# Patient Record
Sex: Male | Born: 1935 | Race: White | Hispanic: No | Marital: Married | Smoking: Former smoker
Health system: Southern US, Community
[De-identification: ages and names within clinical notes are randomized; demographics above are authoritative.]

## PROBLEM LIST (undated history)

## (undated) DIAGNOSIS — E785 Hyperlipidemia, unspecified: Secondary | ICD-10-CM

## (undated) DIAGNOSIS — K5792 Diverticulitis of intestine, part unspecified, without perforation or abscess without bleeding: Secondary | ICD-10-CM

## (undated) DIAGNOSIS — I2510 Atherosclerotic heart disease of native coronary artery without angina pectoris: Secondary | ICD-10-CM

## (undated) DIAGNOSIS — M1990 Unspecified osteoarthritis, unspecified site: Secondary | ICD-10-CM

## (undated) DIAGNOSIS — I10 Essential (primary) hypertension: Secondary | ICD-10-CM

## (undated) DIAGNOSIS — J189 Pneumonia, unspecified organism: Secondary | ICD-10-CM

## (undated) DIAGNOSIS — I252 Old myocardial infarction: Secondary | ICD-10-CM

## (undated) HISTORY — PX: OTHER SURGICAL HISTORY: SHX169

## (undated) HISTORY — PX: INGUINAL HERNIA REPAIR: SUR1180

## (undated) HISTORY — DX: Diverticulitis of intestine, part unspecified, without perforation or abscess without bleeding: K57.92

## (undated) HISTORY — DX: Unspecified osteoarthritis, unspecified site: M19.90

## (undated) HISTORY — DX: Hyperlipidemia, unspecified: E78.5

## (undated) HISTORY — DX: Atherosclerotic heart disease of native coronary artery without angina pectoris: I25.10

## (undated) HISTORY — DX: Essential (primary) hypertension: I10

## (undated) HISTORY — PX: REPLACEMENT TOTAL KNEE: SUR1224

## (undated) HISTORY — DX: Old myocardial infarction: I25.2

---

## 1998-08-01 HISTORY — PX: CORONARY STENT PLACEMENT: SHX1402

## 1999-01-24 ENCOUNTER — Inpatient Hospital Stay (HOSPITAL_COMMUNITY): Admission: EM | Admit: 1999-01-24 | Discharge: 1999-01-27 | Payer: Self-pay | Admitting: Cardiology

## 1999-01-26 HISTORY — PX: CARDIAC CATHETERIZATION: SHX172

## 2004-10-20 ENCOUNTER — Ambulatory Visit: Payer: Self-pay | Admitting: Family Medicine

## 2004-11-22 ENCOUNTER — Ambulatory Visit: Payer: Self-pay | Admitting: Family Medicine

## 2005-06-20 ENCOUNTER — Ambulatory Visit: Payer: Self-pay | Admitting: Family Medicine

## 2005-06-22 ENCOUNTER — Ambulatory Visit: Payer: Self-pay | Admitting: Family Medicine

## 2005-08-31 ENCOUNTER — Ambulatory Visit: Payer: Self-pay | Admitting: Family Medicine

## 2005-09-15 ENCOUNTER — Ambulatory Visit: Payer: Self-pay | Admitting: Family Medicine

## 2005-09-28 ENCOUNTER — Encounter (INDEPENDENT_AMBULATORY_CARE_PROVIDER_SITE_OTHER): Payer: Self-pay | Admitting: Specialist

## 2005-09-28 ENCOUNTER — Ambulatory Visit (HOSPITAL_COMMUNITY): Admission: RE | Admit: 2005-09-28 | Discharge: 2005-09-29 | Payer: Self-pay | Admitting: General Surgery

## 2007-01-08 ENCOUNTER — Emergency Department (HOSPITAL_COMMUNITY): Admission: EM | Admit: 2007-01-08 | Discharge: 2007-01-08 | Payer: Self-pay | Admitting: Emergency Medicine

## 2007-09-14 ENCOUNTER — Encounter: Admission: RE | Admit: 2007-09-14 | Discharge: 2007-09-14 | Payer: Self-pay | Admitting: General Surgery

## 2007-11-01 ENCOUNTER — Ambulatory Visit (HOSPITAL_COMMUNITY): Admission: RE | Admit: 2007-11-01 | Discharge: 2007-11-01 | Payer: Self-pay | Admitting: General Surgery

## 2007-11-27 ENCOUNTER — Encounter (INDEPENDENT_AMBULATORY_CARE_PROVIDER_SITE_OTHER): Payer: Self-pay | Admitting: General Surgery

## 2007-11-27 ENCOUNTER — Ambulatory Visit (HOSPITAL_COMMUNITY): Admission: RE | Admit: 2007-11-27 | Discharge: 2007-11-28 | Payer: Self-pay | Admitting: General Surgery

## 2009-03-10 ENCOUNTER — Inpatient Hospital Stay (HOSPITAL_COMMUNITY): Admission: RE | Admit: 2009-03-10 | Discharge: 2009-03-13 | Payer: Self-pay | Admitting: Specialist

## 2010-02-22 HISTORY — PX: CARDIOVASCULAR STRESS TEST: SHX262

## 2010-08-27 ENCOUNTER — Ambulatory Visit: Payer: Self-pay | Admitting: Cardiology

## 2010-11-06 LAB — CBC
HCT: 32.3 % — ABNORMAL LOW (ref 39.0–52.0)
HCT: 36.1 % — ABNORMAL LOW (ref 39.0–52.0)
Hemoglobin: 10.8 g/dL — ABNORMAL LOW (ref 13.0–17.0)
Hemoglobin: 12.5 g/dL — ABNORMAL LOW (ref 13.0–17.0)
MCHC: 34.6 g/dL (ref 30.0–36.0)
MCHC: 35 g/dL (ref 30.0–36.0)
MCV: 91.8 fL (ref 78.0–100.0)
MCV: 91.8 fL (ref 78.0–100.0)
MCV: 92 fL (ref 78.0–100.0)
Platelets: 204 10*3/uL (ref 150–400)
Platelets: 225 10*3/uL (ref 150–400)
RBC: 3.4 MIL/uL — ABNORMAL LOW (ref 4.22–5.81)
RDW: 12.6 % (ref 11.5–15.5)
RDW: 13 % (ref 11.5–15.5)
WBC: 13.2 10*3/uL — ABNORMAL HIGH (ref 4.0–10.5)
WBC: 8.6 10*3/uL (ref 4.0–10.5)

## 2010-11-06 LAB — BASIC METABOLIC PANEL
BUN: 8 mg/dL (ref 6–23)
CO2: 28 mEq/L (ref 19–32)
Calcium: 8.3 mg/dL — ABNORMAL LOW (ref 8.4–10.5)
Chloride: 101 mEq/L (ref 96–112)
Chloride: 99 mEq/L (ref 96–112)
Creatinine, Ser: 0.84 mg/dL (ref 0.4–1.5)
GFR calc Af Amer: >60 mL/min (ref >60)
GFR calc Af Amer: >60 mL/min (ref >60)
GFR calc non Af Amer: >60 mL/min (ref >60)
Glucose, Bld: 173 mg/dL — ABNORMAL HIGH (ref 70–99)
Glucose, Bld: 188 mg/dL — ABNORMAL HIGH (ref 70–99)

## 2010-11-06 LAB — ABO/RH: ABO/RH(D): A POS

## 2010-11-06 LAB — COMPREHENSIVE METABOLIC PANEL
ALT: 25 U/L (ref 0–53)
AST: 25 U/L (ref 0–37)
Albumin: 3.9 g/dL (ref 3.5–5.2)
Alkaline Phosphatase: 50 U/L (ref 39–117)
CO2: 28 mEq/L (ref 19–32)
Chloride: 103 mEq/L (ref 96–112)
GFR calc Af Amer: >60 mL/min (ref >60)
GFR calc non Af Amer: >60 mL/min (ref >60)
Potassium: 4 mEq/L (ref 3.5–5.1)
Sodium: 137 mEq/L (ref 135–145)

## 2010-11-06 LAB — URINALYSIS, ROUTINE W REFLEX MICROSCOPIC
Bilirubin Urine: NEGATIVE
Glucose, UA: NEGATIVE mg/dL
Hgb urine dipstick: NEGATIVE
Nitrite: NEGATIVE
Specific Gravity, Urine: 1.018 (ref 1.005–1.030)
pH: 7 (ref 5.0–8.0)

## 2010-11-06 LAB — DIFFERENTIAL
Basophils Absolute: 0 10*3/uL (ref 0.0–0.1)
Eosinophils Relative: 1 % (ref 0–5)
Lymphocytes Relative: 40 % (ref 12–46)
Monocytes Absolute: 0.4 10*3/uL (ref 0.1–1.0)
Monocytes Relative: 8 % (ref 3–12)

## 2010-11-06 LAB — CROSSMATCH

## 2010-11-06 LAB — PROTIME-INR: INR: 1 (ref 0.00–1.49)

## 2010-12-14 NOTE — Op Note (Signed)
NAMEBASSAM, John Gamble                  ACCOUNT NO.:  [id number]   MEDICAL RECORD NO.:  [id number]          PATIENT TYPE:  OIB   LOCATION:  5118                         FACILITY:  [facility]   PHYSICIAN:  Ollen Gross. Vernell Morgans, M.D. DATE OF BIRTH:  10-04-1930   DATE OF PROCEDURE:  11/27/2007  DATE OF DISCHARGE:                               OPERATIVE REPORT   PREOPERATIVE DIAGNOSIS:  Recurrent right inguinal hernia.   POSTOPERATIVE DIAGNOSIS:  Recurrent right inguinal hernia.   PROCEDURE:  Repair of recurrent right inguinal hernia with mesh.   SURGEON:  Ollen Gross. Vernell Morgans, MD   ANESTHESIA:  General endotracheal.   PROCEDURE:  After informed consent was obtained, the patient was brought  to the operating room and placed in the supine position on the table.  After adequate induction of general anesthesia, the patient's right  groin and abdomen were prepped with Betadine and draped in usual sterile  manner.  An incision was made along the patient's old right inguinal  hernia incision with a 10-blade knife.  This incision was carried down  through the skin and subcutaneous tissue sharply with electrocautery  until the fascia of the external oblique was encountered.  The fascia  was also excised sharply with the electrocautery.  The previously placed  UltraPro mesh was identified.  The external oblique fascia was dissected  free from the mesh by a combination of blunt hemostat dissection and  some sharp dissection with Metzenbaum scissors.  Once this was  accomplished, we were able to identify the mesh above the cord  structures.  We were also able to identify the cord structures.  Blunt  dissection was used to surround the cord structures and then a 1/2-inch  Penrose drain was placed around the cord structures for retraction  purposes.  This dissection of the mesh was also carried inferior to the  cord and the area where the mesh was sewed to the shelving edge of the  inguinal ligament was also  identified.  The mesh appeared to be attached  to the inguinal ligament and medially to the fascia around the pubic  tubercle.  There was no medial component to the recurrent hernia.  It  appeared as though at this point that inferior and lateral to the cord  where the previous mesh was wrapped around the cord and anchored, the  mesh had separated at this point and a new hernia was coming through at  this point.  The hernia sac was identified.  It was gently skeletonized  and separated from the rest of the cord structures.  This was done  sharply with the electrocautery and bluntly with hemostat dissection.  The sac, once it was completely separated, was opened.  There were no  visceral contents within the sac.  The sac was ligated at its base with  a 2-0 silk suture ligature.  The distal sac was excised and sent to  pathology and the stump of the sac was allowed to retract back beneath  the transversalis layer.  Blunt dissection in this plane below the level  of  the previous placed mesh was carried out bluntly with finger  dissection.  At this point, a Prolene hernia system that was 2 layers  was chosen to repair the hernia.  The mesh was placed in the defect and  the deep circle of mesh was expanded to cover the defect from the  inside.  Externally, the layer of mesh was sewed in several places to  the shelving edge of inguinal ligament interrupted 2-0 Prolene stitches.  The mesh was then sewed along the edge of the defect to the previously  placed mesh also with interrupted 2-0 Prolene stitches.  Once this was  accomplished, the mesh appeared to be in good position.  The hernia was  well repaired with no tension and no other abnormalities were noted.  The wound was then irrigated with copious amounts of saline.  The  external oblique was then reapproximated with a running 2-0 Vicryl  stitch.  The wound was infiltrated with more 0.25% Marcaine.  The  subcutaneous fascia was closed with  running 3-0  Vicryl stitch and the skin was closed with a running 4-0 Monocryl  subcuticular stitch.  Dermabond dressing was applied.  The patient  tolerated the procedure well.  At the end of the case all needle,  sponge, and instrument counts were correct.  The patient was then  awakened and taken to recovery room in stable condition.      Ollen Gross. Vernell Morgans, M.D.  Electronically Signed     PST/MEDQ  D:  11/27/2007  T:  11/28/2007  Job:  [id number]

## 2010-12-14 NOTE — Consult Note (Signed)
John, Gamble                  ACCOUNT NO.:  [id number]   MEDICAL RECORD NO.:  [id number]          PATIENT TYPE:  INP   LOCATION:  1614                         FACILITY:  [facility]   PHYSICIAN:  Pramod P. Pearlean Brownie, MD    DATE OF BIRTH:  05-May-1931   DATE OF CONSULTATION:  03/11/2009  DATE OF DISCHARGE:                                 CONSULTATION   REFERRING PHYSICIAN:  Erasmo Leventhal, M.D.   REASON FOR REFERRAL:  Slurred speech.   HISTORY OF PRESENT ILLNESS:  Mr. John Gamble is a 77 year old Caucasian male  who had right total knee replacement yesterday.  This surgery finished  at about 7:00 p.m.  He was getting Dilaudid injections every 2 hours and  Vicodin p.r.n.  At 3 o'clock he states he threw up and after that he  felt nauseous.  His mouth was dry and he had trouble speaking and he  feels, moving his tongue.  The symptoms have still persisted.  There is  no focal extremity weakness, numbness and double vision, headache or  blurred vision noted.  He has no prior history of stroke, TIA, seizures  or significant neurological problems.  He has had some drooping of the  left eyelid from previous eye surgery.   PAST MEDICAL HISTORY:  1. Hypertension.  2. Hyperlipidemia.  3. Corneal transplant.   PAST SURGICAL HISTORY:  Multiple hernia repairs.   HOME MEDICATION:  Hydrochlorothiazide, lovastatin, quinapril,  gemfibrozil, prednisone, eye drops.   ALLERGIES:  Oysters.   FAMILY HISTORY:  No neurological problems.   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  He  has quit smoking.  Does not drink alcohol.   PHYSICAL EXAM:  Reveals a pleasant elderly Caucasian male who is lying  in bed and is currently afebrile.  Temperature 97.5, blood pressure  123/73, heart rate 76 per minute, respiratory rate 16 per minute, oxygen  saturations 98% on room air.  HEAD:  Nontraumatic.  NECK:  Supple.  There is no bruit.  ENT EXAM:  Unremarkable.  CARDIAC EXAM:  No  murmur or gallop.  LUNGS:  Clear to auscultation.  NEUROLOGICAL EXAM:  The patient is awake and alert.  He is oriented to  time, place and person.  He has no aphasia.  His speech seems quite  clear to me and do not hear any dysarthria.  His eye movements are full  range without nystagmus.  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  Face is  symmetric.  Tongue is midline.  He has a good cough and gag.  Palatal  movements are normal.  Motor system exam reveals no upper extremity  drift, symmetric, equal strength, tone, reflexes, coordination,  sensation in the upper extremities.  Right lower extremity exam is  limited due to knee surgery, but he can move his toes well.  He has good  strength in the left leg.  Gait was not tested.   DATA REVIEWED:  CT scan of the  head noncontrast study is unremarkable.  Electrolytes are normal.  EKG shows normal sinus rhythm.  White count is  minimally elevated at 13.2.  Hemoglobin/hematocrit and platelet count  are normal.   IMPRESSION:  A 77 year old gentleman with some speech difficulties which  I doubt are secondary to a stroke or a TIA.  This probably is related to  his dry mouth and nausea from his pain medications and recent surgery.   PLAN:  I do not recommend any further stroke workup.  I would try to  minimize narcotic pain medications and treat his nausea.  If symptoms  persist or he has new symptoms, kindly call us back.  I had long  discussion with the patient, his wife and other family members and  answered questions.           ______________________________  Sunny Schlein. Pearlean Brownie, MD     PPS/MEDQ  D:  03/11/2009  T:  03/11/2009  Job:  [id number]

## 2010-12-14 NOTE — Discharge Summary (Signed)
John Gamble, John Gamble                  ACCOUNT NO.:  [id number]   MEDICAL RECORD NO.:  [id number]          PATIENT TYPE:  INP   LOCATION:  1614                         FACILITY:  [facility]   PHYSICIAN:  Erasmo Leventhal, M.D.DATE OF BIRTH:  01-22-1931   DATE OF ADMISSION:  03/10/2009  DATE OF DISCHARGE:  03/13/2009                               DISCHARGE SUMMARY   ADMISSION DIAGNOSES:  1. End-stage osteoarthritis, right knee.  2. Hypertension.  3. Hyperlipidemia.  4. History of corneal implant.   DISCHARGE DIAGNOSES:  1. Right total knee arthroplasty.  2. Postoperative dizziness, nausea and vomiting, resolved.  Probable      secondary to narcotics.  Cerebrovascular accident workup negative      with head CT and neurological consult.  3. Postoperative urinary retention improved with Flomax.  4. History of hypertension.  5. History of hyperlipidemia.  6. History of corneal implant.   HISTORY OF PRESENT ILLNESS:  The patient is a 77 year old gentleman with  a history of longstanding OA of his right knee.  He has failed  conservative treatment.  The patient has elected to proceed with a total  knee arthroplasty.  The patient proceeded with a total knee  arthroplasty.   MEDICATIONS ON ADMISSION:  1. Hydrochlorothiazide.  2. Lovastatin.  3. Quinapril.  4. Gemfibrozil.  5. Prednisone eye drops.   He is allergic to OYSTERS.   SURGICAL PROCEDURES:  On March 10, 2009, the patient was taken to the  OR by Dr. Valma Cava assisted by Oneida Alar, P.A.-C.  Under spinal and  MAC anesthesia, the patient underwent a right total knee arthroplasty  with a DePuy rotating platform system.  The patient tolerated the  procedure well.  There were no complications.  The patient was  transferred to the recovery room and then the orthopedic floor in good  condition to follow total knee protocol.  The patient had the following  components implanted:  A size 5 right femoral component, a size 5 NBT  keeled tibial tray, a size five 10-mm thickness polyethylene bearing, a  size 38 three-peg patella.  All components were implanted with  polymethyl methacrylate.   CONSULTS:  The following routine consults requested:  Physical therapy,  case management.   A neurologic consult was requested for evaluation and rule out  postoperative stroke.   HOSPITAL COURSE:  On March 10, 2009, the patient was admitted to the  [facility] under the care of Dr. Hayden Rasmussen.  The patient  was taken to the OR where a right total knee arthroplasty was performed  without any complications.  The patient was transferred to the recovery  room and then to the orthopedic floor in good condition.  Postoperative  night #1, the patient was receiving some IV pain medicines due to severe  pain.  The patient about 4 a.m. developed some significant nausea and  vomiting and dizziness.  On the morning evaluation, the patient did have  a slight eye drop.  He states he did have somewhat in the past, but also  the patient stated difficulty with performing  his words and slurring his  words.  He still had severe spinning in his head with some nausea and  vomiting, so a neurological consult was requested, and a head CT was  performed.  Head CT was unremarkable.  Neurology consulted and evaluated  the patient.  They felt it was probably secondary to narcotics.  No  further workup was recommended as they did not feel it was a stroke.  The patient's symptoms did resolve after being placed on some meclizine  and discontinuing his IV narcotics.  The patient develop some urinary  tension the following day with discontinuing the Foley.  He had in-and-  out x3.  His Foley catheter was replaced.  He was placed on Flomax.  The  Foley catheter was removed, and he has been able to void without  difficulty.  The patient otherwise had no other untoward events as vital  signs remained stable.  He remained afebrile.  His right  lower extremity  wound remained benign for any signs of infection.  The patient worked  well with physical therapy and was able to ambulate in excess of 100  feet, so arrangements were made on postoperative day #3 for him to be  discharged home to follow-up in 2 weeks with Dr. Thomasena Edis, and he is  going to get home health physical therapy and a CPM.   LABORATORY DATA:  CBC on discharge found WBCs 8.6, hemoglobin 10.8,  hematocrit 30.9, platelets 205.  CMET on postoperative day #2 found  sodium 131, potassium 4.1, glucose 173, BUN 8, creatinine 0.72.  Elevated glucose was felt due to his inactivity, and diet will be  monitored.   DISCHARGE INSTRUCTIONS:  Diet - no restrictions.   Activity - the patient is to increase his activity daily with the use of  a walker and use a CPM.  The patient is to the follow instructions with  home health physical therapy.   Wound care - the patient is to keep the wound clean and dry and change  his dressing on a daily basis.   Follow-up - with Dr. Thomasena Edis in 2 weeks.  The patient is to call [phone] for that follow-up appointment.   DISCHARGE MEDICATIONS:  1,  Flomax 0.4 mg once a day for a total of 5  days.  1. Percocet 5 mg 1or 2 tablets every 4-6 hours for pain if needed.  2. Robaxin 500 mg 1 tablet every 6 hours for muscle spasms if needed.  3. Lovenox 1 injection every 12 hours until gone for a total of 21      days.  4. Colace over-the-counter 100 mg twice a day while on narcotics.  5. Hydrochlorothiazide 12.5 mg once a day.  6. Lovastatin 20 mg once a day.  7. Quinapril 40 mg once a day.  8. Gemfibrozil 600 mg twice a day.  9. Osteo Bi-Flex 1 tablet twice a day.  10.Prednisolone acetate eye drops daily.  11.MiraLax daily.   The patient's condition upon discharge to home is improved and good.      Jamelle Rushing, P.A.    ______________________________  Erasmo Leventhal, M.D.    RWK/MEDQ  D:  03/13/2009  T:  03/13/2009   Job:  [id number]   cc:   Erasmo Leventhal, M.D.  Fax: [phone]   Dina Rich  Fax: [phone]   Peter M. [place], M.D.  Fax: [phone]

## 2010-12-14 NOTE — Op Note (Signed)
NAMEKADDEN, OSTERHOUT                  ACCOUNT NO.:  [id number]   MEDICAL RECORD NO.:  [id number]          PATIENT TYPE:  INP   LOCATION:  0006                         [facility]ACILITY:  [facility]   PHYSICIAN:  Erasmo Leventhal, M.D.DATE OF BIRTH:  October 06, 1930   DATE OF PROCEDURE:  03/10/2009  DATE OF DISCHARGE:                               OPERATIVE REPORT   TIME:  Approximately 6:15 p.m.   PREOPERATIVE DIAGNOSIS:  Right knee end-stage osteoarthritis.   POSTOPERATIVE DIAGNOSIS:  Right knee end-stage osteoarthritis.   PROCEDURE:  Right total knee arthroplasty.   SURGEON:  Erasmo Leventhal, MD.   ASSISTANT:  Jamelle Rushing, PA-C.   ANESTHESIA:  Spinal with monitored anesthesia care.   ESTIMATED BLOOD LOSS:  Less than 100 mL.   DRAINS:  One Hemovac.   COMPLICATIONS:  None.   TOURNIQUET TIME:  75 minutes at 300 mmHg.   DISPOSITION:  PACU stable.   OPERATIVE IMPLANTS:  Laural Benes and [employer].  Sigma size 5 femur,  size 5 femur, size 5 tibia, 38 mm all polyethylene patella, and a 10 mm  posterior stabilized rotating platform tibial insert.  All cemented.   OPERATIVE DETAILS:  The patient was counseled in the holding area.  IV  was started.  Taken to the operating room where a spinal anesthetic was  administered.  IV antibiotics were given, Foley catheter was placed  utilizing a sterile technique by the OR circulating nurse.  His knee was  examined, he had a 7-degree flexion contracture that flexed to 110  degrees.  He was elevated, prepped with DuraPrep, draped in a sterile  fashion, and exsanguinated with an Esmarch.  The tourniquet was inflated  to 300 mmHg.  A straight midline incision was made in the skin and  subcutaneous tissue.  Medial and lateral soft tissue flaps were  developed at the appropriate level.  Medial parapatellar arthrotomy was  performed.  Proximal and medial soft tissue release was done.  The knee  was flexed,  end-stage arthritis changes, bone against  bone.  Cruciate  ligaments were resected.  Starter hole made over the distal femur.  The  canal was irrigated.  The fluid was clear.  Intramedullary rod was  gently placed.  I chose a 5-degree valgus cut and took an 11-mm cuff off  the distal femur due to the flexion contracture.  The distal femur was  found be a size 5.  Rotation marks were set.  The cutting blocks applied  for a size 5.  Medial and lateral menisci were removed under direct  visualization.  Geniculate vessels were coagulated.  Posterior  neurovascular structures were sought out and protected throughout the  entire case.  A fibular extramedullary alignment was utilized to the  tibia.  We chose a 10 mm cut off the least sufficient side, which was  the lateral side in a 0-degree slope, rotation was cut, and the proximal  tibia was cut.  The posteromedial and posterior femoral osteophytes were  removed.  Flexion and extension blocks for a 10-insert were well-  balanced.   The knee  was exposed, the tibia was exposed.  A size 5 was determined,  the rotation coverage set, and reamer punch was performed in a femoral  box cut.  At this time, a size 5 femur, a size 5 tibia, a 10 insert were  well-balanced in flexion and extension, started varus and valgus stress  at 0, 30, 60, and 90 degrees of flexion.  Patella was found be a size  38, the __________bones were resected, locking holes were made, the  patella button tracked anatomically, and all trials were removed.  The  knee was irrigated with pulsatile lavage.  Utilizing modern cement  technique, all components were cemented in place.  A size 5 femur, a  size 5 tibia, and 38 patella after cement had cured with the 10-insert  well-balanced with flexion and extension gaps, full extension, the  patella tracking anatomically, and excellent alignment.  The trials were  removed, the excess cement was removed, and a final 10 mm posterior  stabilized rotating platform tibial insert was  implanted.  I now  examined the knee.  He had a well-aligned, well-balanced knee that  tracked anatomically.  A medium Hemovac drain was placed.  A sequential  closure of the layers was done and the arthrotomy was closed in 90  degrees of flexion with Vicryl suture in a figure-of-eight fashion.  A  subcu Vicryl, the skin with a subcuticular locking suture, Steri-Strips  were applied, the drain hooked to suction, a sterile dressing was  applied, the tourniquet was deflated.  Normal circulation in the foot  and ankle at the end of the case.  No complications or problems.  Our  sponge and needle count was correct.  He was taken from the operating  room to the PACU in stable condition.   To help with surgical technique and decision making, Mr. Oneida Alar, PA-  C's, assistance was needed throughout the entire case.           ______________________________  Erasmo Leventhal, M.D.     RAC/MEDQ  D:  03/10/2009  T:  03/10/2009  Job:  [id number]

## 2010-12-17 NOTE — Op Note (Signed)
NAMENAYEF, COLLEGE                  ACCOUNT NO.:  [id number]   MEDICAL RECORD NO.:  [id number]          PATIENT TYPE:  OIB   LOCATION:  5731                         FACILITY:  [facility]   PHYSICIAN:  Ollen Gross. Vernell Morgans, M.D. DATE OF BIRTH:  [birth year]   DATE OF PROCEDURE:  09/28/2005  DATE OF DISCHARGE:  09/29/2005                                 OPERATIVE REPORT   PREOPERATIVE DIAGNOSIS:  Bilateral inguinal hernias and umbilical hernia.   POSTOP DIAGNOSIS:  Bilateral inguinal hernias and umbilical hernia.   PROCEDURES:  Bilateral inguinal hernia repairs with mesh and umbilical  hernia repair.   SURGEON:  Dr. Carolynne Edouard.   ANESTHESIA:  General endotracheal.   PROCEDURE:  After informed consent was obtained, the patient was brought to  the operating room, placed in supine position on the table. After adequate  induction of general anesthesia, the patient's abdomen and both groins were  prepped with Betadine and draped in usual sterile manner.  Attention was  first turned towards the right groin.  Right groin was infiltrated 4%  Marcaine and incision was made from the edge of the pubic tubercle on the  right towards the anterior spine for a distance of 5-6 cm.  This incision  was carried down through the skin and subcutaneous tissue sharply with the  electrocautery until the fascia of the external oblique was encountered.  Fascia of the external oblique was opened along its fibers towards the apex  of the external ring using a 15 blade knife and Metzenbaum scissors.  A  Weitlaner retractor was then deployed. Blunt dissection was then carried out  of the cord structures at the edge of the pubic tubercle until the cord  structures could be surrounded between two fingers.  A 1/2-inch Penrose  drain was then placed around the cord structures for retraction purposes.  The ilioinguinal nerve was also identified and appeared to be involved with  a lot scar tissue.  It was therefore clamped proximally  and distally with  hemostats, divided and ligated with 3-0 silk ties. The cord structures were  then gently skeletonized by combination of blunt finger dissection and a  hemostat dissection as well as sharp dissection with electrocautery. The  hernia sac was able to be identified. The sac was gradually dissected away  from the rest of cord structures and the sac was then opened sharply with  Metzenbaum scissors. There were no visceral contents within the sac.  The  sac was then ligated near its base with the 2-0 silk suture ligature. The  distal sac was excised and sent to pathology for identification.  The stump  of the sac was allowed to retract back beneath the transversalis layer.  No  other hernias were identified. Next a 3 x 6 piece of Ultrapro mesh was then  chosen and cut to fit.  The mesh was sewed inferiorly to the shelving edge  of inguinal ligament with a running 2-0 Prolene stitch. Tails were cut in  the mesh laterally and the tails were wrapped around the cord structures.  The mesh was sewed  superiorly to the musculoaponeurotic strength layer of  the transversalis with interrupted 2-0 Prolene vertical mattress stitches.  The tails of mesh were then anchored laterally to the shelving edge of the  inguinal ligament with interrupted 2-0 Prolene stitch. Once this was  accomplished, the mesh appeared to be in good position without any tension.  The wound was irrigated with copious amounts of saline. The external oblique  fascia was then reapproximated with a running 2-0 Vicryl stitch. The wound  was infiltrated with more 4% Marcaine, the subcutaneous fascia was closed  with a running 3-0 Vicryl stitch and skin was closed with a running 4-0  Monocryl subcuticular stitch. Next attention was turned to the left groin.  Again the left groin was infiltrated 4% Marcaine. A small incision was made  from the edge of the pubic tubercle on the left towards the anterior  superior iliac spine  for a distance of about 5-6 cm.  This incision was  carried down through the skin and subcutaneous tissue sharply until the  fascia of the external oblique was encountered. The fascia of the external  oblique was opened along its fibers with a 15 blade knife and Metzenbaum  scissors towards apex the external ring. The left hernia sac had been  unreducible until this fascial layer was opened and then it was able to be  easily reduced. Blunt dissection was then carried out of the cord structures  at the edge of the pubic tubercle until the cord structures could be  surrounded between two fingers.  1/2-inch Penrose drain was then placed  around the cord structures for retraction purposes. Cord structure was then  gently skeletonized by combination of blunt hemostat dissection and sharp  dissection with electrocautery until the hernia sac was identified, until it  was able to be separated from the rest of the cord structures.  The sac was  then opened with Metzenbaum scissors. There were no visceral contents within  the sac was then ligated near its base with a 2-0 silk suture ligature. The  distal sac was excised and sent to pathology for identification. The stump  of the sac was allowed to retract back beneath the transversalis layer. Next  a piece of 3 x 6 Ultrapro mesh was chosen and cut to fit.  The mesh was  sewed inferiorly to the shelving edge of inguinal ligament with a running 2-  0 Prolene stitch. The mesh was then sewed superiorly to the muscular  aponeurotic strength layer of the transversalis.  Tails were cut in the mesh  laterally and the tails were wrapped around the cord structures and anchored  laterally to the shelving edge of inguinal ligament with an interrupted 2-0  Prolene stitch. Next the external oblique was then reapproximated with a  running 2-0 Vicryl stitch. This wound was then infiltrated more 0.25%  Marcaine and the subcutaneous fascia was closed with a running 3-0  Vicryl stitch. The skin was closed with a running 4-0 Monocryl subcuticular stitch.  Next a infraumbilical incision was then made with a 15 blade knife. This  incision was carried down through the skin and subcutaneous tissue sharply  with electrocautery. The hernia sac was identified beneath the umbilicus.  This hernia sac was opened. There was only some omental fat within the sac.  The sac contents were reduced easily. The fascial defect was very small.  The fascial defect was then closed with interrupted #1 Novofil stitches. The  subcutaneous tissue was irrigated saline and the  wound was then closed with  a deep layer of interrupted 3-0 Vicryl stitches and the skin was closed with  running 4-0 Monocryl subcuticular stitch. Benzoin, Steri-Strips and sterile  dressings were applied. The patient tolerated procedure well.  At the end of  the case all needle, sponge instrument counts correct. The patient was  awakened, taken recovery in stable condition.      Ollen Gross. Vernell Morgans, M.D.  Electronically Signed     PST/MEDQ  D:  10/02/2005  T:  10/03/2005  Job:  [id number]

## 2010-12-17 NOTE — H&P (Signed)
John Gamble, John Gamble                  ACCOUNT NO.:  [id number]   MEDICAL RECORD NO.:  [id number]          PATIENT TYPE:  INP   LOCATION:                               FACILITY:  [facility]   PHYSICIAN:  Erasmo Leventhal, M.D.DATE OF BIRTH:  11-01-1930   DATE OF ADMISSION:  03/10/2009  DATE OF DISCHARGE:                              HISTORY & PHYSICAL   DATE OF ADMISSION:  March 10, 2009   CHIEF COMPLAINT:  Painful range of motion right knee.   HISTORY OF PRESENT ILLNESS:  John Gamble is a 77 year old gentleman patient  of Dr. Thomasena Edis for painful range of motion of right knee.  The patient  has failed conservative treatment.  The patient has elected to proceed  with a total knee arthroplasty.  The patient has pain and difficulty  with range of motion.  X-rays reveal he has significant loss of medial  joint.  He has significant patellofemoral changes.   MEDICATION ALLERGIES:  None.  He is allergic to oysters.   CURRENT MEDICATIONS:  Gemfibrozil, Accupril, hydrochlorothiazide,  lovastatin, aspirin, Centrum Silver, zinc, fish oil, MiraLax, Osteo Bi-  Flex,  prednisolone, acetate eye drops.   PRIMARY CARE PHYSICIAN:  Dr. Dina Rich.   CARDIOLOGIST:  Peter M. Swaziland, M.D.   REVIEW OF SYSTEMS:  NEUROLOGIC:  Recently has been going through issues  for vertigo.  He is being treated with a decongestant and some Benadryl.  He is going to follow up with Dr. Sol Passer at the next week.  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  PULMONARY:  Is unremarkable.  CARDIOVASCULAR: He did have a heart attack in June  2000.  He had stenting 3 days later.  He does have coronary artery  disease.  Last stress test was 2 years previous.  He denies any recent  chest pains, irregular heart rhythms or any cardiac issues.  GI:  Unremarkable.  GU:  He just has a little bit of difficulty with  urination start up, but he does have some mild BPH.  He is not being  treated.  ENDOCRINE:   unremarkable.  HEMATOLOGIC:  Unremarkable.   PAST SURGICAL HISTORY:  1. He has had multiple hernia repairs over the years.  2. He has also had a corneal transplant without any complications.   FAMILY MEDICAL HISTORY:  Father is deceased from colon cancer at the age  of 74.  Mother is deceased from a blood disorder at 61.  Sister died at  75 with a stroke and dementia.   SOCIAL HISTORY:  The patient is married, lives with his wife.  He is a  retired Medical illustrator.  He has smoked in the past.  No alcohol or drugs.  He  does live in a single family home.  His wife will help care for him  postop.   PHYSICAL EXAM:  Height is 6 feet 1 inch, weight is 180, blood pressure  is 132/68, pulse of 74 and regular, respirations 12, patient is  afebrile.  GENERAL:  This is a healthy-appearing, well-developed  gentleman  conscious, alert and appropriate.  He does have a hearing aid in his  right ear.  NECK:  Supple.  No palpable lymphadenopathy.  Has got good range of  motion.  CHEST:  Lung sounds were clear and equal bilaterally.  No wheezes,  rales, rhonchi.  HEART:  Regular rate and rhythm.  No murmurs, rubs or gallops.  ABDOMEN:  Soft, nontender.  Bowel sounds present.  EXTREMITIES:  Upper extremities had good range of motion of shoulders,  elbows and wrists.  He had good motor strength.  LOWER EXTREMITIES:  He had good range of motion both hips without any  discomfort.  His right knee:  He lacked about 10 degrees of extension.  He was able to flex it back to 120.  He did have crepitus.  No erythema.  No effusion.  Calf was soft, nontender.  His left knee:  He was able to  fully extend it.  He could flex it back to 130, no instability.  He had  good motion of both ankles.  PERIPHERAL VASCULAR:  Carotid pulses were 2+ bruits.  Radial pulses were  2+, posterior tibial pulses were 2+.  He had no lower extremity edema.  BREAST/RECTAL/GU:  Exams were deferred at this time.   IMPRESSION:  1. Severe  osteoarthritis right knee with some mild varus deformity.  2. Coronary artery disease with history of MI and cardiac stenting.  3. Hypercholesterolemia.  4. Hypertension.  5. Recent history of vertigo.  6. History of shingles.  7. History of corneal implant.   PLAN:  The patient has been cleared from the cardiac standpoint by Dr.  Peter Swaziland.  He is also currently going through a workup for medical  clearance through Dr. Dina Rich.  The patient will undergo all other  routine laboratories and tests prior to having a right total knee  arthroplasty by Dr. Thomasena Edis at [facility] on March 10, 2009.      Jamelle Rushing, P.A.    ______________________________  Erasmo Leventhal, M.D.    RWK/MEDQ  D:  02/16/2009  T:  02/16/2009  Job:  [id number]   cc:   Erasmo Leventhal, M.D.  Fax: [phone]

## 2011-02-14 ENCOUNTER — Other Ambulatory Visit: Payer: Self-pay | Admitting: *Deleted

## 2011-02-14 MED ORDER — QUINAPRIL HCL 40 MG PO TABS: 40.0000 mg | ORAL_TABLET | Freq: Every day | ORAL | Status: DC

## 2011-02-14 NOTE — Telephone Encounter (Signed)
escribe medication per fax request  

## 2011-02-24 ENCOUNTER — Encounter: Payer: Self-pay | Admitting: Cardiology

## 2011-02-25 ENCOUNTER — Encounter: Payer: Self-pay | Admitting: Cardiology

## 2011-02-28 ENCOUNTER — Ambulatory Visit (INDEPENDENT_AMBULATORY_CARE_PROVIDER_SITE_OTHER): Payer: Medicare Other | Admitting: Cardiology

## 2011-02-28 ENCOUNTER — Encounter: Payer: Self-pay | Admitting: Cardiology

## 2011-02-28 VITALS — BP 128/86 | HR 92 | Wt 187.2 lb

## 2011-02-28 DIAGNOSIS — E785 Hyperlipidemia, unspecified: Secondary | ICD-10-CM

## 2011-02-28 DIAGNOSIS — I2510 Atherosclerotic heart disease of native coronary artery without angina pectoris: Secondary | ICD-10-CM

## 2011-02-28 DIAGNOSIS — I252 Old myocardial infarction: Secondary | ICD-10-CM | POA: Insufficient documentation

## 2011-02-28 DIAGNOSIS — I10 Essential (primary) hypertension: Secondary | ICD-10-CM

## 2011-02-28 NOTE — Assessment & Plan Note (Signed)
He is on gemfibrozil and lovastatin. We will plan on checking fasting lab work on his next visit. It was noted his blood sugar was mildly elevated on his last blood work and we have recommended weight loss and avoidance of sweets.

## 2011-02-28 NOTE — Assessment & Plan Note (Signed)
He remains asymptomatic. His stress nuclear study in July of 2011 showed a small area of infarct in the inferior apical region. There was no ischemia. Ejection fraction was 61%. We will continue with risk factor modification.

## 2011-02-28 NOTE — Assessment & Plan Note (Signed)
Blood pressure is well controlled on ACE inhibitor and HCTZ.

## 2011-02-28 NOTE — Patient Instructions (Signed)
Continue your current therapy.  I will see you back again in 6 months with fasting lab work.  Work on weight loss and avoid sweets.

## 2011-02-28 NOTE — Progress Notes (Signed)
   John Gamble Date of Birth: [birth year]   History of Present Illness: John Gamble is seen today for followup. He reports an episode of pneumonia in May that cleared with antibiotics. He denies any chest pain, shortness of breath, or palpitations. He continues to exercise regularly. He is trying to lose weight.  Current Outpatient Prescriptions on File Prior to Visit  Medication Sig Dispense Refill  . fish oil-omega-3 fatty acids 1000 MG capsule Take 1,200 mg by mouth daily.        Marland Kitchen gemfibrozil (LOPID) 600 MG tablet Take 600 mg by mouth 2 (two) times daily before a meal.        . Glucosamine-Chondroitin (OSTEO BI-FLEX REGULAR STRENGTH PO) Take by mouth 2 (two) times daily.        . hydrochlorothiazide (,MICROZIDE/HYDRODIURIL,) 12.5 MG capsule Take 12.5 mg by mouth daily.        Marland Kitchen lovastatin (MEVACOR) 20 MG tablet Take 20 mg by mouth at bedtime.        . Multiple Vitamin (MULTIVITAMIN) tablet Take 1 tablet by mouth daily.        . Multiple Vitamins-Minerals (ZINC PO) Take by mouth daily.        . prednisoLONE acetate (PRED FORTE) 1 % ophthalmic suspension Place 1 drop into the left eye 4 (four) times daily.        . quinapril (ACCUPRIL) 40 MG tablet Take 1 tablet (40 mg total) by mouth at bedtime.  30 tablet  5  . aspirin 81 MG tablet Take 81 mg by mouth daily.          No Known Allergies  Past Medical History  Diagnosis Date  . Coronary artery disease   . MI, old   . Hypertension   . OA (osteoarthritis)     Past Surgical History  Procedure Date  . Cardiac catheterization 01/26/1999    EF 55%  . Coronary stent placement 2000    LEFT CIRCUMFLEX CORONARY  . Replacement total knee   . Inguinal hernia repair   . Cardiovascular stress test 02/22/2010    EF 61%    History  Smoking status  . Former Smoker  . Quit date: 02/23/1989  Smokeless tobacco  . Not on file    History  Alcohol Use No    Family History  Problem Relation Age of Onset  . Cancer Mother   . Cancer  Father     Review of Systems: As noted in history of present illness.  All other systems were reviewed and are negative.  Physical Exam: BP 128/86  Pulse 92  Wt 187 lb 3.2 oz (84.913 kg) He is a pleasant white male in no acute distress. HEENT normocephalic, atraumatic. Pupils are equal round and reactive to light accommodation. Extraocular movements are full. Oropharynx is clear. Neck is supple no JVD, adenopathy, thyromegaly, or bruits. Lungs are clear. Cardiac exam reveals a regular rate and rhythm without gallop, murmur, or click. Abdomen is soft and nontender. He has no masses or bruits. Femoral and pedal pulses are good. He has no edema. He is alert and oriented x3. Cranial nerves II through XII are intact. LABORATORY DATA:   Assessment / Plan:

## 2011-03-21 ENCOUNTER — Other Ambulatory Visit: Payer: Self-pay | Admitting: *Deleted

## 2011-03-21 MED ORDER — LOVASTATIN 20 MG PO TABS: 20.0000 mg | ORAL_TABLET | Freq: Every day | ORAL | Status: DC

## 2011-04-22 ENCOUNTER — Other Ambulatory Visit: Payer: Self-pay | Admitting: *Deleted

## 2011-04-22 MED ORDER — GEMFIBROZIL 600 MG PO TABS: 600.0000 mg | ORAL_TABLET | Freq: Two times a day (BID) | ORAL | Status: DC

## 2011-04-26 LAB — CBC
MCHC: 34.7
MCHC: 34.2
MCV: 91.2
Platelets: 273
RBC: 4.7
RDW: 12.9
WBC: 6.4

## 2011-04-26 LAB — BASIC METABOLIC PANEL
BUN: 14
CO2: 28
Calcium: 10
Calcium: 9.3
Chloride: 101
Creatinine, Ser: 1.03
Creatinine, Ser: 0.91
GFR calc Af Amer: >60
GFR calc non Af Amer: >60
Sodium: 138

## 2011-05-19 LAB — CBC
MCV: 91.4
RBC: 4.7
WBC: 8.6

## 2011-05-19 LAB — BASIC METABOLIC PANEL
Calcium: 9.4
Chloride: 104
Creatinine, Ser: 1.04
GFR calc Af Amer: >60
Sodium: 138

## 2011-05-19 LAB — URINALYSIS, ROUTINE W REFLEX MICROSCOPIC
Glucose, UA: NEGATIVE
Protein, ur: NEGATIVE
Specific Gravity, Urine: 1.011
pH: 6

## 2011-05-19 LAB — DIFFERENTIAL
Lymphs Abs: 2.2
Monocytes Relative: 6
Neutro Abs: 5.8
Neutrophils Relative %: 67

## 2011-07-18 ENCOUNTER — Other Ambulatory Visit: Payer: Self-pay | Admitting: *Deleted

## 2011-07-18 MED ORDER — HYDROCHLOROTHIAZIDE 25 MG PO TABS: 12.5000 mg | ORAL_TABLET | Freq: Every day | ORAL | Status: DC

## 2011-08-17 ENCOUNTER — Other Ambulatory Visit: Payer: Self-pay

## 2011-08-17 MED ORDER — QUINAPRIL HCL 40 MG PO TABS: 40.0000 mg | ORAL_TABLET | Freq: Every day | ORAL | Status: DC

## 2011-08-19 ENCOUNTER — Ambulatory Visit: Payer: Medicare Other | Admitting: Cardiology

## 2011-08-19 ENCOUNTER — Other Ambulatory Visit: Payer: Medicare Other | Admitting: *Deleted

## 2011-08-29 ENCOUNTER — Ambulatory Visit (INDEPENDENT_AMBULATORY_CARE_PROVIDER_SITE_OTHER): Payer: Medicare Other | Admitting: Cardiology

## 2011-08-29 ENCOUNTER — Other Ambulatory Visit (INDEPENDENT_AMBULATORY_CARE_PROVIDER_SITE_OTHER): Payer: Medicare Other | Admitting: *Deleted

## 2011-08-29 ENCOUNTER — Encounter: Payer: Self-pay | Admitting: Cardiology

## 2011-08-29 VITALS — BP 142/82 | HR 60 | Ht 73.0 in | Wt 189.0 lb

## 2011-08-29 DIAGNOSIS — I2510 Atherosclerotic heart disease of native coronary artery without angina pectoris: Secondary | ICD-10-CM

## 2011-08-29 DIAGNOSIS — R7309 Other abnormal glucose: Secondary | ICD-10-CM

## 2011-08-29 DIAGNOSIS — E785 Hyperlipidemia, unspecified: Secondary | ICD-10-CM

## 2011-08-29 DIAGNOSIS — I10 Essential (primary) hypertension: Secondary | ICD-10-CM

## 2011-08-29 LAB — LIPID PANEL
Cholesterol: 126 mg/dL (ref 0–200)
HDL: 37.6 mg/dL — ABNORMAL LOW (ref >39.00)
Total CHOL/HDL Ratio: 3
Triglycerides: 97 mg/dL (ref 0.0–149.0)

## 2011-08-29 LAB — HEPATIC FUNCTION PANEL
ALT: 24 U/L (ref 0–53)
AST: 22 U/L (ref 0–37)
Albumin: 4.1 g/dL (ref 3.5–5.2)
Alkaline Phosphatase: 57 U/L (ref 39–117)
Total Protein: 7.2 g/dL (ref 6.0–8.3)

## 2011-08-29 LAB — BASIC METABOLIC PANEL
BUN: 18 mg/dL (ref 6–23)
Chloride: 106 mEq/L (ref 96–112)
GFR: 72.46 mL/min (ref >60.00)
Glucose, Bld: 124 mg/dL — ABNORMAL HIGH (ref 70–99)
Potassium: 4.5 mEq/L (ref 3.5–5.1)

## 2011-08-29 LAB — CBC WITH DIFFERENTIAL/PLATELET
Basophils Absolute: 0 10*3/uL (ref 0.0–0.1)
Eosinophils Relative: 1.5 % (ref 0.0–5.0)
HCT: 41.8 % (ref 39.0–52.0)
Lymphs Abs: 2.6 10*3/uL (ref 0.7–4.0)
Monocytes Relative: 6.5 % (ref 3.0–12.0)
Neutrophils Relative %: 45.9 % (ref 43.0–77.0)
Platelets: 272 10*3/uL (ref 150.0–400.0)
RDW: 13.4 % (ref 11.5–14.6)
WBC: 5.8 10*3/uL (ref 4.5–10.5)

## 2011-08-29 NOTE — Assessment & Plan Note (Signed)
He is currently on lovastatin and gemfibrozil. We will followup fasting lab work today. On his last blood work his glucose is mildly elevated and we will need to follow.

## 2011-08-29 NOTE — Patient Instructions (Signed)
We will call with the results of your blood work today.  I will see you again in 6 months.

## 2011-08-29 NOTE — Progress Notes (Signed)
   John Gamble Date of Birth: [birth year]   History of Present Illness: John Gamble is seen today for followup.  He denie he is doing very well. s any chest pain, shortness of breath, or palpitations. He continues to exercise regularly walking 45 minutes on the treadmill at the gym.   Current Outpatient Prescriptions on File Prior to Visit  Medication Sig Dispense Refill  . aspirin 81 MG tablet Take 81 mg by mouth daily.        . fish oil-omega-3 fatty acids 1000 MG capsule Take 1,200 mg by mouth daily.        Marland Kitchen gemfibrozil (LOPID) 600 MG tablet Take 1 tablet (600 mg total) by mouth 2 (two) times daily before a meal.  60 tablet  5  . Glucosamine-Chondroitin (OSTEO BI-FLEX REGULAR STRENGTH PO) Take by mouth 2 (two) times daily.        . hydrochlorothiazide (HYDRODIURIL) 25 MG tablet Take 0.5 tablets (12.5 mg total) by mouth daily.  30 tablet  6  . lovastatin (MEVACOR) 20 MG tablet Take 1 tablet (20 mg total) by mouth at bedtime.  30 tablet  5  . Multiple Vitamin (MULTIVITAMIN) tablet Take 1 tablet by mouth daily.        . Multiple Vitamins-Minerals (ZINC PO) Take by mouth daily.        . prednisoLONE acetate (PRED FORTE) 1 % ophthalmic suspension Place 1 drop into the left eye 4 (four) times daily.        . quinapril (ACCUPRIL) 40 MG tablet Take 1 tablet (40 mg total) by mouth at bedtime.  30 tablet  2    No Known Allergies  Past Medical History  Diagnosis Date  . Coronary artery disease   . MI, old   . Hypertension   . OA (osteoarthritis)   . Hyperlipidemia     Past Surgical History  Procedure Date  . Cardiac catheterization 01/26/1999    EF 55%  . Coronary stent placement 2000    LEFT CIRCUMFLEX CORONARY  . Replacement total knee   . Inguinal hernia repair   . Cardiovascular stress test 02/22/2010    EF 61%    History  Smoking status  . Former Smoker  . Quit date: 02/23/1989  Smokeless tobacco  . Not on file    History  Alcohol Use No    Family History  Problem  Relation Age of Onset  . Cancer Mother   . Cancer Father     Review of Systems: As noted in history of present illness.  All other systems were reviewed and are negative.  Physical Exam: BP 142/82  Pulse 60  Ht 6\' 1"  (1.854 m)  Wt 189 lb (85.73 kg)  BMI 24.94 kg/m2 He is a pleasant white male in no acute distress. HEENT normocephalic, atraumatic. Pupils are equal round and reactive to light accommodation. Extraocular movements are full. Oropharynx is clear. Neck is supple no JVD, adenopathy, thyromegaly, or bruits. Lungs are clear. Cardiac exam reveals a regular rate and rhythm without gallop, murmur, or click. Abdomen is soft and nontender. He has no masses or bruits. Femoral and pedal pulses are good. He has no edema. He is alert and oriented x3. Cranial nerves II through XII are intact. LABORATORY DATA:   Assessment / Plan:

## 2011-08-29 NOTE — Assessment & Plan Note (Signed)
He remains asymptomatic. He had remote stenting of the left circumflex coronary in 2000 with a bare-metal stent. We will continue with his current medical therapy and followup again in 6 months. His last nuclear stress test was in July 2011 and was normal.

## 2011-08-29 NOTE — Assessment & Plan Note (Signed)
Blood pressure is mildly elevated today but he reports normal readings at home. We will continue with his current therapy. He is not a candidate for beta blocker due to history of marked bradycardia.

## 2011-09-07 ENCOUNTER — Ambulatory Visit: Payer: Medicare Other | Admitting: Cardiology

## 2011-09-16 ENCOUNTER — Ambulatory Visit: Payer: Medicare Other | Admitting: Cardiology

## 2011-10-03 ENCOUNTER — Other Ambulatory Visit: Payer: Self-pay | Admitting: *Deleted

## 2011-10-03 MED ORDER — LOVASTATIN 20 MG PO TABS: 20.0000 mg | ORAL_TABLET | Freq: Every day | ORAL | Status: DC

## 2011-10-14 ENCOUNTER — Other Ambulatory Visit: Payer: Self-pay

## 2011-10-14 MED ORDER — GEMFIBROZIL 600 MG PO TABS: 600.0000 mg | ORAL_TABLET | Freq: Two times a day (BID) | ORAL | Status: DC

## 2011-11-14 ENCOUNTER — Other Ambulatory Visit: Payer: Self-pay | Admitting: Cardiology

## 2012-01-30 ENCOUNTER — Encounter (HOSPITAL_COMMUNITY): Payer: Self-pay | Admitting: Emergency Medicine

## 2012-01-30 ENCOUNTER — Inpatient Hospital Stay (HOSPITAL_COMMUNITY)
Admission: EM | Admit: 2012-01-30 | Discharge: 2012-01-31 | DRG: 379 | Disposition: A | Discharge status: Home / Self Care | Payer: Medicare Other | Source: Ambulatory Visit | Attending: Family Medicine | Admitting: Family Medicine

## 2012-01-30 ENCOUNTER — Emergency Department (HOSPITAL_COMMUNITY): Payer: Medicare Other

## 2012-01-30 DIAGNOSIS — Z7982 Long term (current) use of aspirin: Secondary | ICD-10-CM

## 2012-01-30 DIAGNOSIS — I252 Old myocardial infarction: Secondary | ICD-10-CM

## 2012-01-30 DIAGNOSIS — Z79899 Other long term (current) drug therapy: Secondary | ICD-10-CM

## 2012-01-30 DIAGNOSIS — I2510 Atherosclerotic heart disease of native coronary artery without angina pectoris: Secondary | ICD-10-CM | POA: Diagnosis present

## 2012-01-30 DIAGNOSIS — M1990 Unspecified osteoarthritis, unspecified site: Secondary | ICD-10-CM | POA: Diagnosis present

## 2012-01-30 DIAGNOSIS — Z809 Family history of malignant neoplasm, unspecified: Secondary | ICD-10-CM

## 2012-01-30 DIAGNOSIS — Z96659 Presence of unspecified artificial knee joint: Secondary | ICD-10-CM

## 2012-01-30 DIAGNOSIS — Z87891 Personal history of nicotine dependence: Secondary | ICD-10-CM

## 2012-01-30 DIAGNOSIS — E785 Hyperlipidemia, unspecified: Secondary | ICD-10-CM | POA: Diagnosis present

## 2012-01-30 DIAGNOSIS — I10 Essential (primary) hypertension: Secondary | ICD-10-CM | POA: Diagnosis present

## 2012-01-30 DIAGNOSIS — K5790 Diverticulosis of intestine, part unspecified, without perforation or abscess without bleeding: Secondary | ICD-10-CM

## 2012-01-30 DIAGNOSIS — K922 Gastrointestinal hemorrhage, unspecified: Principal | ICD-10-CM | POA: Diagnosis present

## 2012-01-30 LAB — CBC WITH DIFFERENTIAL/PLATELET
Basophils Absolute: 0 10*3/uL (ref 0.0–0.1)
HCT: 37.6 % — ABNORMAL LOW (ref 39.0–52.0)
Lymphocytes Relative: 40 % (ref 12–46)
Lymphs Abs: 3 10*3/uL (ref 0.7–4.0)
Monocytes Absolute: 0.4 10*3/uL (ref 0.1–1.0)
Neutro Abs: 3.9 10*3/uL (ref 1.7–7.7)
Platelets: 291 10*3/uL (ref 150–400)
RBC: 4.17 MIL/uL — ABNORMAL LOW (ref 4.22–5.81)
RDW: 13.2 % (ref 11.5–15.5)
WBC: 7.4 10*3/uL (ref 4.0–10.5)

## 2012-01-30 LAB — COMPREHENSIVE METABOLIC PANEL
ALT: 19 U/L (ref 0–53)
AST: 19 U/L (ref 0–37)
Alkaline Phosphatase: 64 U/L (ref 39–117)
CO2: 24 mEq/L (ref 19–32)
Chloride: 102 mEq/L (ref 96–112)
GFR calc Af Amer: >90 mL/min (ref >90)
GFR calc non Af Amer: 78 mL/min — ABNORMAL LOW (ref >90)
Glucose, Bld: 128 mg/dL — ABNORMAL HIGH (ref 70–99)
Sodium: 138 mEq/L (ref 135–145)
Total Bilirubin: 0.3 mg/dL (ref 0.3–1.2)

## 2012-01-30 LAB — CBC
Hemoglobin: 11.6 g/dL — ABNORMAL LOW (ref 13.0–17.0)
MCH: 31.4 pg (ref 26.0–34.0)
MCHC: 34.9 g/dL (ref 30.0–36.0)
MCV: 89.7 fL (ref 78.0–100.0)

## 2012-01-30 LAB — APTT: aPTT: 31 seconds (ref 24–37)

## 2012-01-30 MED ORDER — GEMFIBROZIL 600 MG PO TABS
600.0000 mg | ORAL_TABLET | Freq: Two times a day (BID) | ORAL | Status: DC
  Administered 2012-01-31: 600 mg via ORAL
  Filled 2012-01-30 (×4): qty 1

## 2012-01-30 MED ORDER — ONDANSETRON HCL 4 MG PO TABS: 4.0000 mg | ORAL_TABLET | Freq: Four times a day (QID) | ORAL | Status: DC | PRN

## 2012-01-30 MED ORDER — PANTOPRAZOLE SODIUM 40 MG IV SOLR
40.0000 mg | Freq: Every day | INTRAVENOUS | Status: DC
  Administered 2012-01-30: 40 mg via INTRAVENOUS
  Filled 2012-01-30 (×3): qty 40

## 2012-01-30 MED ORDER — SIMVASTATIN 10 MG PO TABS
10.0000 mg | ORAL_TABLET | Freq: Every day | ORAL | Status: DC
  Filled 2012-01-30 (×2): qty 1

## 2012-01-30 MED ORDER — ACETAMINOPHEN 650 MG RE SUPP: 650.0000 mg | Freq: Four times a day (QID) | RECTAL | Status: DC | PRN

## 2012-01-30 MED ORDER — ONDANSETRON HCL 4 MG/2ML IJ SOLN: 4.0000 mg | Freq: Three times a day (TID) | INTRAMUSCULAR | Status: DC | PRN

## 2012-01-30 MED ORDER — SODIUM CHLORIDE 0.9 % IV SOLN
INTRAVENOUS | Status: AC
  Administered 2012-01-30: 18:00:00 via INTRAVENOUS

## 2012-01-30 MED ORDER — SODIUM CHLORIDE 0.9 % IJ SOLN
3.0000 mL | Freq: Two times a day (BID) | INTRAMUSCULAR | Status: DC
  Administered 2012-01-30 – 2012-01-31 (×2): 3 mL via INTRAVENOUS

## 2012-01-30 MED ORDER — IOHEXOL 300 MG/ML  SOLN
20.0000 mL | INTRAMUSCULAR | Status: DC
  Administered 2012-01-30: 20 mL via ORAL

## 2012-01-30 MED ORDER — SODIUM CHLORIDE 0.9 % IV BOLUS (SEPSIS)
1000.0000 mL | Freq: Once | INTRAVENOUS | Status: AC
  Administered 2012-01-30: 1000 mL via INTRAVENOUS

## 2012-01-30 MED ORDER — SODIUM CHLORIDE 0.9 % IJ SOLN: 3.0000 mL | INTRAMUSCULAR | Status: DC | PRN

## 2012-01-30 MED ORDER — ACETAMINOPHEN 325 MG PO TABS: 650.0000 mg | ORAL_TABLET | Freq: Four times a day (QID) | ORAL | Status: DC | PRN

## 2012-01-30 MED ORDER — LISINOPRIL 40 MG PO TABS
40.0000 mg | ORAL_TABLET | Freq: Every day | ORAL | Status: DC
  Filled 2012-01-30 (×2): qty 1

## 2012-01-30 MED ORDER — PANTOPRAZOLE SODIUM 40 MG IV SOLR
40.0000 mg | Freq: Once | INTRAVENOUS | Status: AC
  Administered 2012-01-30: 40 mg via INTRAVENOUS
  Filled 2012-01-30: qty 40

## 2012-01-30 MED ORDER — SODIUM CHLORIDE 0.9 % IV SOLN: 250.0000 mL | INTRAVENOUS | Status: DC | PRN

## 2012-01-30 MED ORDER — QUINAPRIL HCL 10 MG PO TABS: 40.0000 mg | ORAL_TABLET | Freq: Every day | ORAL | Status: DC

## 2012-01-30 MED ORDER — ALUM & MAG HYDROXIDE-SIMETH 200-200-20 MG/5ML PO SUSP: 30.0000 mL | Freq: Four times a day (QID) | ORAL | Status: DC | PRN

## 2012-01-30 MED ORDER — HYDROCHLOROTHIAZIDE 25 MG PO TABS
12.5000 mg | ORAL_TABLET | Freq: Every day | ORAL | Status: DC
  Filled 2012-01-30: qty 0.5

## 2012-01-30 MED ORDER — ONDANSETRON HCL 4 MG/2ML IJ SOLN: 4.0000 mg | Freq: Four times a day (QID) | INTRAMUSCULAR | Status: DC | PRN

## 2012-01-30 MED ORDER — IOHEXOL 300 MG/ML  SOLN
80.0000 mL | Freq: Once | INTRAMUSCULAR | Status: AC | PRN
  Administered 2012-01-30: 80 mL via INTRAVENOUS

## 2012-01-30 MED ORDER — PREDNISOLONE ACETATE 1 % OP SUSP
1.0000 [drp] | Freq: Four times a day (QID) | OPHTHALMIC | Status: DC
  Administered 2012-01-30 – 2012-01-31 (×2): 1 [drp] via OPHTHALMIC
  Filled 2012-01-30: qty 1

## 2012-01-30 NOTE — H&P (Signed)
History and Physical  John Gamble FAO:[id number] DOB: Dec 08, 1930 DOA: 01/30/2012  Referring provider: Drucie Opitz, PA PCP: Sheila Oats, MD [place] Gastroenterologist: Sharrell Ku, MD  Chief Complaint: bleeding from rectum  HPI:  78 year old man presented with complaint of painless rectal bleeding x5 days. Stopped ASA 3 days ago. No nausea or vomiting. History of diverticulosis by colonoscopy and imaging.   In the emergency department was noted to be afebrile with stable vital signs. Hemoglobin 13.1, complete metabolic panel unremarkable. CT of the abdomen and pelvis revealed diverticulosis with no acute pathology. Referred for admission.  Review of Systems:  Negative for fever, changes to his vision, sore throat, rash, new muscle aches, chest pain, shortness of breath, dysuria, nausea, vomiting.  Past Medical History  Diagnosis Date  . Coronary artery disease   . MI, old   . Hypertension   . OA (osteoarthritis)   . Hyperlipidemia    Past Surgical History  Procedure Date  . Cardiac catheterization 01/26/1999    EF 55%  . Coronary stent placement 2000    LEFT CIRCUMFLEX CORONARY  . Replacement total knee   . Inguinal hernia repair   . Cardiovascular stress test 02/22/2010    EF 61%   Social History:  reports that he quit smoking about 22 years ago. He does not have any smokeless tobacco history on file. He reports that he does not drink alcohol or use illicit drugs.  No Known Allergies  Family History  Problem Relation Age of Onset  . Cancer Mother   . Cancer Father    Prior to Admission medications   Medication Sig Start Date End Date Taking? Authorizing Provider  aspirin EC 81 MG tablet Take 81 mg by mouth daily.   Yes Historical Provider, MD  gemfibrozil (LOPID) 600 MG tablet Take 1 tablet (600 mg total) by mouth 2 (two) times daily before a meal. 10/14/11  Yes Peter M [place], MD  Glucosamine-Chondroitin (OSTEO BI-FLEX REGULAR STRENGTH PO) Take by mouth 2  (two) times daily.     Yes Historical Provider, MD  hydrochlorothiazide (HYDRODIURIL) 25 MG tablet Take 0.5 tablets (12.5 mg total) by mouth daily. 07/18/11  Yes Peter M [place], MD  lovastatin (MEVACOR) 20 MG tablet Take 1 tablet (20 mg total) by mouth at bedtime. 10/03/11  Yes Peter M [place], MD  Multiple Vitamin (MULTIVITAMIN) tablet Take 1 tablet by mouth daily.     Yes Historical Provider, MD  Multiple Vitamins-Minerals (ZINC PO) Take by mouth daily.     Yes Historical Provider, MD  Omega-3 Fatty Acids (FISH OIL) 1200 MG CAPS Take 1,200 mg by mouth daily.   Yes Historical Provider, MD  prednisoLONE acetate (PRED FORTE) 1 % ophthalmic suspension Place 1 drop into the left eye 4 (four) times daily.     Yes Historical Provider, MD  quinapril (ACCUPRIL) 40 MG tablet Take 40 mg by mouth at bedtime.   Yes Historical Provider, MD   Physical Exam: Filed Vitals:   01/30/12 1418 01/30/12 1456 01/30/12 1457 01/30/12 1459  BP: 158/83 155/67 145/74 131/89  Pulse: 87 87 95 103  Temp:      TempSrc:      Resp: 20     SpO2: 97%       General:  Appears calm and comfortable.  Eyes: Left pupil is eccentric and nonreactive, cornea scar noted. Right pupil round and reactive.   Neck: No lymphadenopathy or masses. No thyromegaly  ENT: Hard of hearing. Lips and tongue appear unremarkable.  Cardiovascular:  Regular rate and rhythm. No murmur, rub, gallop. No lower extremity edema.  Respiratory: Clear to auscultation bilaterally. No wheezes, rales, rhonchi. Normal respiratory effort.  Abdomen: Soft, nontender, nondistended.  Skin: No rash or induration seen.  Musculoskeletal: Grossly unremarkable.  Psychiatric: Grossly normal mood and affect. Speech fluent and appropriate.  Neurologic: Appears grossly normal.  Labs on Admission:  Basic Metabolic Panel:  Lab 01/30/12 1027  NA 138  K 4.2  CL 102  CO2 24  GLUCOSE 128*  BUN 19  CREATININE 0.91  CALCIUM 10.0  MG --  PHOS --   Liver Function  Tests:  Lab 01/30/12 1121  AST 19  ALT 19  ALKPHOS 64  BILITOT 0.3  PROT 7.4  ALBUMIN 4.4   CBC:  Lab 01/30/12 1121  WBC 7.4  NEUTROABS 3.9  HGB 13.1  HCT 37.6*  MCV 90.2  PLT 291   Radiological Exams on Admission: Ct Abdomen Pelvis W Contrast  01/30/2012  *RADIOLOGY REPORT*  Clinical Data: Rectal bleeding and abdominal pain.  CT ABDOMEN AND PELVIS WITH CONTRAST  Technique:  Multidetector CT imaging of the abdomen and pelvis was performed following the standard protocol during bolus administration of intravenous contrast.  Contrast: 80mL OMNIPAQUE IOHEXOL 300 MG/ML  SOLN  Comparison: 09/14/2007.  Findings: The lung bases are clear.  Extensive coronary artery calcifications are noted.  No pericardial effusion.  A small left hepatic lobe cyst is stable.  No worrisome hepatic lesions or intrahepatic ductal dilatation.  The gallbladder is normal.  No common bile duct dilatation.  The pancreas is normal. The spleen is normal.  The adrenal glands and kidneys are normal.  The stomach is not well distended but no gross abnormalities. Duodenal diverticuli are noted.  The small bowel is unremarkable. The colon demonstrates moderate descending and sigmoid diverticulosis but no findings for acute diverticulitis.  No mass lesions.  The appendix is normal.  No mesenteric or retroperitoneal masses or lymphadenopathy.  There are moderate atherosclerotic calcifications involving the aorta and branch vessels.  The bladder, prostate gland and seminal vesicles are unremarkable. The prostate gland is slightly enlarged.  No pelvic mass, adenopathy or free pelvic fluid collections.  There are small bilateral inguinal hernias, right greater than left which appears stable.  The bony structures are unremarkable.  IMPRESSION:  1.  No acute abdominal/pelvic findings, mass lesions or lymphadenopathy. 2.  Extensive coronary artery calcifications. 3.  Diverticulosis of the colon without findings for acute diverticulitis.   Original Report Authenticated By: P. Loralie Champagne, M.D.     Principal Problem:  *Lower gastrointestinal bleed Active Problems:  MI, old  HTN (hypertension)   Assessment/Plan 1. LGIB: Hemoglobin stable. History most suggestive of diverticular bleed. Serial CBC. PPI. Discussed with primary gastroenterologist by telephone. At this point we will monitor conservatively. I think repeat endoscopy would add little at this point. Should gross hemorrhage occur or the patient require inpatient evaluation his primary gastroenterologist has requested a consult with the on-call physician at the hospital. 2. Hypertension 3. History coronary artery disease: Aspirin on hold.  Code Status: Full code Family Communication: Discussed with wife at bedside. Disposition Plan: Home when improved.  Brendia Sacks, MD  [facility] Pager [phone] If 8PM-8AM, please contact floor/night-coverage at [email or web address], password [facility] 01/30/2012, 3:35 PM

## 2012-01-30 NOTE — ED Notes (Signed)
Pt c/o BRB stool x 6 days; pt denies c/o dizziness; pt denies abd pain

## 2012-01-30 NOTE — ED Provider Notes (Signed)
78 year old male has been having rectal bleeding for the last several weeks. There's also been some intermittent vague abdominal distress. No nausea or vomiting. He is on aspirin but no other anticoagulants or antiplatelet agents. He does have a history of diverticulosis identified on colonoscopy. Exam is benign. Hemoglobin is noted to be 13 and blood is a 1 g drop from the last recorded hemoglobin which is about 6 months ago. CT scan is pending.  Dione Booze, MD 01/30/12 1350

## 2012-01-30 NOTE — ED Notes (Signed)
CDU full at this time 

## 2012-01-30 NOTE — ED Provider Notes (Signed)
History     CSN: [id number]  Arrival date & time 01/30/12  1105   First MD Initiated Contact with Patient 01/30/12 1154      Chief Complaint  Patient presents with  . Rectal Bleeding    (Consider location/radiation/quality/duration/timing/severity/associated sxs/prior treatment) HPI  Patient presents to emergency department complaining of a 6 day history of acute onset bright red bloody stools. Patient states that he goes to the gym just about every day and has a good exercise routine with cardiac exercise and had just finished exercising 6 days ago and then went to the bathroom. Patient states that he had a soft bowel movement and looked down and noticed bright red blood. Patient states that since then he's had numerous episodes of runny bright red/dark red bloody stools. Patient states he's had some mild abdominal discomfort however states that he has history of multiple abdominal hernia repairs and always has some mild abdominal pain. Patient's last colonoscopy was by Dr. Kinnie Scales approximately 5 years ago without any abnormality. Patient states he takes a daily baby aspirin but stopped taking it on Saturday when he noticed repetitive bloody stools. Patient denies any history of GI bleed. Patient states that he called Dr. Jennye Boroughs office today and they suggested having a repeat colonoscopy in 2 weeks. Patient states he was concerned about the amount of bloody stools over last 6 days and therefore presents to emergency department for evaluation. He denies any fevers, chills, chest, shortness breath, dizziness, difficulty ambulating, blood in his urine, or dysuria. Denies aggravating or alleviating factors.  Past Medical History  Diagnosis Date  . Coronary artery disease   . MI, old   . Hypertension   . OA (osteoarthritis)   . Hyperlipidemia     Past Surgical History  Procedure Date  . Cardiac catheterization 01/26/1999    EF 55%  . Coronary stent placement 2000    LEFT CIRCUMFLEX  CORONARY  . Replacement total knee   . Inguinal hernia repair   . Cardiovascular stress test 02/22/2010    EF 61%    Family History  Problem Relation Age of Onset  . Cancer Mother   . Cancer Father     History  Substance Use Topics  . Smoking status: Former Smoker    Quit date: 02/23/1989  . Smokeless tobacco: Not on file  . Alcohol Use: No      Review of Systems  All other systems reviewed and are negative.    Allergies  Review of patient's allergies indicates no known allergies.  Home Medications   Current Outpatient Rx  Name Route Sig Dispense Refill  . ASPIRIN EC 81 MG PO TBEC Oral Take 81 mg by mouth daily.    Marland Kitchen GEMFIBROZIL 600 MG PO TABS Oral Take 1 tablet (600 mg total) by mouth 2 (two) times daily before a meal. 60 tablet 3  . OSTEO BI-FLEX REGULAR STRENGTH PO Oral Take by mouth 2 (two) times daily.      Marland Kitchen HYDROCHLOROTHIAZIDE 25 MG PO TABS Oral Take 0.5 tablets (12.5 mg total) by mouth daily. 30 tablet 6  . LOVASTATIN 20 MG PO TABS Oral Take 1 tablet (20 mg total) by mouth at bedtime. 30 tablet 5  . ONE-DAILY MULTI VITAMINS PO TABS Oral Take 1 tablet by mouth daily.      Marland Kitchen ZINC PO Oral Take by mouth daily.      Marland Kitchen FISH OIL 1200 MG PO CAPS Oral Take 1,200 mg by mouth daily.    Marland Kitchen  PREDNISOLONE ACETATE 1 % OP SUSP Left Eye Place 1 drop into the left eye 4 (four) times daily.      . QUINAPRIL HCL 40 MG PO TABS Oral Take 40 mg by mouth at bedtime.      BP 148/65  Pulse 95  Temp 97.3 F (36.3 C) (Oral)  Resp 18  SpO2 96%  Physical Exam  Nursing note and vitals reviewed. Constitutional: He is oriented to person, place, and time. He appears well-developed and well-nourished. No distress.  HENT:  Head: Normocephalic and atraumatic.  Eyes: Conjunctivae are normal.  Neck: Normal range of motion. Neck supple.  Cardiovascular: Normal rate, regular rhythm, normal heart sounds and intact distal pulses.  Exam reveals no gallop and no friction rub.   No murmur  heard. Pulmonary/Chest: Effort normal and breath sounds normal. No respiratory distress. He has no wheezes. He has no rales. He exhibits no tenderness.  Abdominal: Soft. Bowel sounds are normal. He exhibits no distension and no mass. There is tenderness. There is no rebound and no guarding.       Mild TTP of lower abdomen but no rigidity or guarding. Soft abdomen.   Genitourinary:       Bright red blood mixed with coffee ground stool per rectum. Normal rectal tone. No TTP   Musculoskeletal: Normal range of motion.  Neurological: He is alert and oriented to person, place, and time.  Skin: Skin is warm and dry. No rash noted. He is not diaphoretic. No erythema.  Psychiatric: He has a normal mood and affect.    ED Course  Procedures (including critical care time)  IV fluids. IV protonix.   IV fluids Labs Reviewed  CBC WITH DIFFERENTIAL - Abnormal; Notable for the following:    RBC 4.17 (*)     HCT 37.6 (*)     All other components within normal limits  COMPREHENSIVE METABOLIC PANEL - Abnormal; Notable for the following:    Glucose, Bld 128 (*)     GFR calc non Af Amer 78 (*)     All other components within normal limits  PROTIME-INR  TYPE AND SCREEN  APTT   Ct Abdomen Pelvis W Contrast  01/30/2012  *RADIOLOGY REPORT*  Clinical Data: Rectal bleeding and abdominal pain.  CT ABDOMEN AND PELVIS WITH CONTRAST  Technique:  Multidetector CT imaging of the abdomen and pelvis was performed following the standard protocol during bolus administration of intravenous contrast.  Contrast: 80mL OMNIPAQUE IOHEXOL 300 MG/ML  SOLN  Comparison: 09/14/2007.  Findings: The lung bases are clear.  Extensive coronary artery calcifications are noted.  No pericardial effusion.  A small left hepatic lobe cyst is stable.  No worrisome hepatic lesions or intrahepatic ductal dilatation.  The gallbladder is normal.  No common bile duct dilatation.  The pancreas is normal. The spleen is normal.  The adrenal glands and  kidneys are normal.  The stomach is not well distended but no gross abnormalities. Duodenal diverticuli are noted.  The small bowel is unremarkable. The colon demonstrates moderate descending and sigmoid diverticulosis but no findings for acute diverticulitis.  No mass lesions.  The appendix is normal.  No mesenteric or retroperitoneal masses or lymphadenopathy.  There are moderate atherosclerotic calcifications involving the aorta and branch vessels.  The bladder, prostate gland and seminal vesicles are unremarkable. The prostate gland is slightly enlarged.  No pelvic mass, adenopathy or free pelvic fluid collections.  There are small bilateral inguinal hernias, right greater than left which appears stable.  The bony structures are unremarkable.  IMPRESSION:  1.  No acute abdominal/pelvic findings, mass lesions or lymphadenopathy. 2.  Extensive coronary artery calcifications. 3.  Diverticulosis of the colon without findings for acute diverticulitis.  Original Report Authenticated By: P. Loralie Champagne, M.D.   I spoke with Dr. Kinnie Scales who agrees that since patient is orthostatic and symptomatic upon standing that he does need to be admitted to the hospital have colonoscopy in the very near future. He does not have admitting privileges and therefore the hospitalist will need to admit the patient. He states that they can contact him at telephone [phone] once the patient has gone to his admitted to floor to consider coming in tonight to be scoped versus tomorrow.  1. Lower GI bleed       MDM  Dr. Irene Limbo with [facility] to admit and he will contact Dr. Kinnie Scales once patient has bed assignment. Dr. Preston Fleeting agreeable with assessment and plan.         [place], [place] 01/30/12 [phone]

## 2012-01-30 NOTE — ED Notes (Signed)
Patient transported to CT 

## 2012-01-30 NOTE — ED Notes (Signed)
Lab at bedside

## 2012-01-30 NOTE — ED Notes (Signed)
Oral contrast completed.  CT notified.  Pt resting in stretcher in NAD, respirations even and unlabored.

## 2012-01-30 NOTE — ED Notes (Signed)
Patient transported from CT 

## 2012-01-31 DIAGNOSIS — K5730 Diverticulosis of large intestine without perforation or abscess without bleeding: Secondary | ICD-10-CM

## 2012-01-31 DIAGNOSIS — I252 Old myocardial infarction: Secondary | ICD-10-CM

## 2012-01-31 LAB — CBC
Hemoglobin: 11 g/dL — ABNORMAL LOW (ref 13.0–17.0)
MCH: 31.4 pg (ref 26.0–34.0)
MCH: 30.7 pg (ref 26.0–34.0)
MCHC: 34.8 g/dL (ref 30.0–36.0)
MCHC: 34.2 g/dL (ref 30.0–36.0)
MCV: 90.1 fL (ref 78.0–100.0)
Platelets: 240 10*3/uL (ref 150–400)
Platelets: 223 10*3/uL (ref 150–400)
RDW: 13.4 % (ref 11.5–15.5)
RDW: 13.3 % (ref 11.5–15.5)
WBC: 5.6 10*3/uL (ref 4.0–10.5)

## 2012-01-31 MED ORDER — HYDROCHLOROTHIAZIDE 12.5 MG PO CAPS
12.5000 mg | ORAL_CAPSULE | Freq: Every day | ORAL | Status: DC
  Administered 2012-01-31: 12.5 mg via ORAL
  Filled 2012-01-31: qty 1

## 2012-01-31 NOTE — Care Management Note (Signed)
    Page 1 of 1   01/31/2012     12:27:06 PM   CARE MANAGEMENT NOTE 01/31/2012  Patient:  John Gamble, John Gamble   Account Number:  [id number]  Date Initiated:  01/31/2012  Documentation initiated by:  Onnie Boer  Subjective/Objective Assessment:   PT WAS ADMITTED WITH GI BLEED     Action/Plan:   PROGRESSION OF CARE AND DISCHARGE PLANNING   Anticipated DC Date:  01/31/2012   Anticipated DC Plan:  HOME/SELF CARE      DC Planning Services  CM consult      Choice offered to / List presented to:             Status of service:  Completed, signed off Medicare Important Message given?   (If response is "NO", the following Medicare IM given date fields will be blank) Date Medicare IM given:   Date Additional Medicare IM given:    Discharge Disposition:  HOME/SELF CARE  Per UR Regulation:  Reviewed for med. necessity/level of care/duration of stay  If discussed at Long Length of Stay Meetings, dates discussed:    Comments:  01/31/12 Onnie Boer, RN, BSN 1218 PT WAS ADMITTED WITH GI BLEED.  PT SHOULD BE DC'D TO HOME WITH SELF CARE TODAY.

## 2012-01-31 NOTE — ED Provider Notes (Signed)
Medical screening examination/treatment/procedure(s) were conducted as a shared visit with non-physician practitioner(s) and myself.  I personally evaluated the patient during the encounter   Dione Booze, MD 01/31/12 [phone]

## 2012-01-31 NOTE — Discharge Summary (Addendum)
Physician Discharge Summary  John Gamble ZOX:[id number] DOB: 31-Mar-1931 DOA: 01/30/2012  PCP: John Oats, MD Gastroenterologist: John Ku, MD  Admit date: 01/30/2012 Discharge date: 01/31/2012  Recommendations for Outpatient Follow-up:  1. Followup lower GI bleed, presumably diverticular in origin. 2. Consider resuming aspirin in the next few weeks.  Follow-up Information    Follow up with DEFAULT,PROVIDER, MD in 1 month.   Contact information:   [address] [place] [address] [phone]       Follow up with Gamble,John R, MD. Schedule an appointment as soon as possible for a visit in 1 week.   Contact information:   [address] [place] [address] [phone]         Discharge Diagnoses:  1. Lower GI bleed 2. History diverticulosis 3. Hypertension  Discharge Condition: Improved Disposition: Home  Diet recommendation: Heart healthy diet. Avoid nuts, seeds, popcorn.  History of present illness:  78 year old man presented with complaint of painless rectal bleeding x5 days. Stopped ASA 3 days ago. No nausea or vomiting. History of diverticulosis by colonoscopy and imaging.    In the emergency department was noted to be afebrile with stable vital signs. Hemoglobin 13.1, complete metabolic panel unremarkable. CT of the abdomen and pelvis revealed diverticulosis with no acute pathology. Referred for admission.  Hospital Course:  [name] was admitted to the medical floor. He had no further bleeding. Hemoglobin has stabilized in the drop seen on admission was probably secondary to dilution. He feels much better would like to go home. He will followup with his gastroenterologist next week. He will hold aspirin until cleared by his gastroenterologist. Chronic comorbidities have remained stable. 1. LGIB: No bleeding since admission. Initial drop suspected to dilution. Hemoglobin has remained stable since that time.  Etiology--presumed diverticular in origin based on history and imaging. Discussed with primary gastroenterologist by telephone today--he will followup with patient early next week. He concurs with discharge home today without further evaluation. I think repeat endoscopy would add little at this point as this is almost certainly diverticular in origin.    2. Hypertension: Controlled. Continue hydrochlorothiazide, lisinopril.  3. History coronary artery disease: Aspirin on hold until cleared by gastroenterologist.  Consultants:  None  Procedures:  None  Discharge Instructions  Discharge Orders    Future Orders Please Complete By Expires   Diet - low sodium heart healthy      Discharge instructions      Comments:   Call physician or return to emergency department for recurrent bleeding. Do not start aspirin until cleared by gastroenterologist. May resume usual level of activity. Avoid nuts, seeds, popcorn.   Activity as tolerated - No restrictions        Medication List  As of 01/31/2012 10:03 AM   STOP taking these medications         aspirin EC 81 MG tablet         TAKE these medications         Fish Oil 1200 MG Caps   Take 1,200 mg by mouth daily.      gemfibrozil 600 MG tablet   Commonly known as: LOPID   Take 1 tablet (600 mg total) by mouth 2 (two) times daily before a meal.      hydrochlorothiazide 25 MG tablet   Commonly known as: HYDRODIURIL   Take 0.5 tablets (12.5 mg total) by mouth daily.      lovastatin 20 MG tablet   Commonly known as: MEVACOR  Take 1 tablet (20 mg total) by mouth at bedtime.      multivitamin tablet   Take 1 tablet by mouth daily.      OSTEO BI-FLEX REGULAR STRENGTH PO   Take by mouth 2 (two) times daily.      PRED FORTE 1 % ophthalmic suspension   Generic drug: prednisoLONE acetate   Place 1 drop into the left eye 4 (four) times daily.      quinapril 40 MG tablet   Commonly known as: ACCUPRIL   Take 40 mg by mouth at bedtime.        ZINC PO   Take by mouth daily.           The results of significant diagnostics from this hospitalization (including imaging, microbiology, ancillary and laboratory) are listed below for reference.    Significant Diagnostic Studies: Ct Abdomen Pelvis W Contrast  01/30/2012  *RADIOLOGY REPORT*  Clinical Data: Rectal bleeding and abdominal pain.  CT ABDOMEN AND PELVIS WITH CONTRAST  Technique:  Multidetector CT imaging of the abdomen and pelvis was performed following the standard protocol during bolus administration of intravenous contrast.  Contrast: 80mL OMNIPAQUE IOHEXOL 300 MG/ML  SOLN  Comparison: 09/14/2007.  Findings: The lung bases are clear.  Extensive coronary artery calcifications are noted.  No pericardial effusion.  A small left hepatic lobe cyst is stable.  No worrisome hepatic lesions or intrahepatic ductal dilatation.  The gallbladder is normal.  No common bile duct dilatation.  The pancreas is normal. The spleen is normal.  The adrenal glands and kidneys are normal.  The stomach is not well distended but no gross abnormalities. Duodenal diverticuli are noted.  The small bowel is unremarkable. The colon demonstrates moderate descending and sigmoid diverticulosis but no findings for acute diverticulitis.  No mass lesions.  The appendix is normal.  No mesenteric or retroperitoneal masses or lymphadenopathy.  There are moderate atherosclerotic calcifications involving the aorta and branch vessels.  The bladder, prostate gland and seminal vesicles are unremarkable. The prostate gland is slightly enlarged.  No pelvic mass, adenopathy or free pelvic fluid collections.  There are small bilateral inguinal hernias, right greater than left which appears stable.  The bony structures are unremarkable.  IMPRESSION:  1.  No acute abdominal/pelvic findings, mass lesions or lymphadenopathy. 2.  Extensive coronary artery calcifications. 3.  Diverticulosis of the colon without findings for acute  diverticulitis.  Original Report Authenticated By: P. Loralie Champagne, M.D.   Labs: Basic Metabolic Panel:  Lab 01/30/12 7829  NA 138  K 4.2  CL 102  CO2 24  GLUCOSE 128*  BUN 19  CREATININE 0.91  CALCIUM 10.0  MG --  PHOS --   Liver Function Tests:  Lab 01/30/12 1121  AST 19  ALT 19  ALKPHOS 64  BILITOT 0.3  PROT 7.4  ALBUMIN 4.4   CBC:  Lab 01/31/12 0544 01/31/12 0001 01/30/12 1907 01/30/12 1121  WBC 5.3 5.6 6.3 7.4  NEUTROABS -- -- -- 3.9  HGB 11.0* 10.8* 11.6* 13.1  HCT 32.2* 31.0* 33.2* 37.6*  MCV 89.9 90.1 89.7 90.2  PLT 223 240 226 291   Principal Problem:  *Lower gastrointestinal bleed Active Problems:  MI, old  HTN (hypertension)   Time coordinating discharge: 25 minutes  Signed:  Brendia Sacks, MD [facility] 01/31/2012, 10:03 AM

## 2012-01-31 NOTE — Progress Notes (Signed)
[facility] PROGRESS NOTE  LEMOYNE NESTOR UEA:[id number] DOB: [birth year] DOA: 01/30/2012 PCP: Sheila Oats, MD Gastroenterologist: Sharrell Ku, MD  Assessment/Plan: 1. LGIB: No bleeding since admission. Initial drop suspected to dilution. Hemoglobin has remained stable since that time. Discussed with primary gastroenterologist by telephone again today--he will followup with patient early next week. He concurs with discharge home today without further evaluation. I think repeat endoscopy would add little at this point as this is almost certainly diverticular in origin.  2. Hypertension: Controlled. Continue hydrochlorothiazide, lisinopril. 3. History coronary artery disease: Aspirin on hold until cleared by gastroenterologist.  Code Status: Full code  Family Communication:  Disposition Plan: Home later today if no further bleeding.  Brendia Sacks, MD  [facility] Pager [phone]. If 8PM-8AM, please contact night-coverage at [email or web address], password [facility] 01/31/2012, 8:58 AM  LOS: 1 day   Brief narrative: 78 year old man presented with complaint of painless rectal bleeding x5 days. Stopped ASA 3 days ago. No nausea or vomiting. History of diverticulosis by colonoscopy and imaging.   In the emergency department was noted to be afebrile with stable vital signs. Hemoglobin 13.1, complete metabolic panel unremarkable. CT of the abdomen and pelvis revealed diverticulosis with no acute pathology. Referred for admission.  Consultants:  None  Procedures:  None  HPI/Subjective: Afebrile, vital signs stable. No further bleeding. No complaints. Feels "100% better". Feels ready to go home.  Objective: Filed Vitals:   01/30/12 1630 01/30/12 1700 01/30/12 2210 01/31/12 0645  BP: 120/83  94/55 147/68  Pulse: 82  59 69  Temp: 97.8 F (36.6 C)  97.6 F (36.4 C) 97.5 F (36.4 C)  TempSrc: Oral  Oral Oral  Resp: 18  20 18   Height:  6\' 1"  (1.854 m)    Weight:  83.008  kg (183 lb)    SpO2: 98%  96% 94%    Intake/Output Summary (Last 24 hours) at 01/31/12 0858 Last data filed at 01/31/12 0832  Gross per 24 hour  Intake    363 ml  Output    700 ml  Net   -337 ml    Exam:   General:  Appears calm and comfortable. Well-appearing.  Cardiovascular: Regular rate and rhythm. No murmur, rub, gallop. No lower extremity edema.  Respiratory: Clear to auscultation bilaterally. No wheezes, rales, rhonchi. Normal respiratory effort.  Data Reviewed: Basic Metabolic Panel:  Lab 01/30/12 2130  NA 138  K 4.2  CL 102  CO2 24  GLUCOSE 128*  BUN 19  CREATININE 0.91  CALCIUM 10.0  MG --  PHOS --   Liver Function Tests:  Lab 01/30/12 1121  AST 19  ALT 19  ALKPHOS 64  BILITOT 0.3  PROT 7.4  ALBUMIN 4.4   CBC:  Lab 01/31/12 0544 01/31/12 0001 01/30/12 1907 01/30/12 1121  WBC 5.3 5.6 6.3 7.4  NEUTROABS -- -- -- 3.9  HGB 11.0* 10.8* 11.6* 13.1  HCT 32.2* 31.0* 33.2* 37.6*  MCV 89.9 90.1 89.7 90.2  PLT 223 240 226 291   Studies: Ct Abdomen Pelvis W Contrast  01/30/2012  *RADIOLOGY REPORT*  Clinical Data: Rectal bleeding and abdominal pain.  CT ABDOMEN AND PELVIS WITH CONTRAST  Technique:  Multidetector CT imaging of the abdomen and pelvis was performed following the standard protocol during bolus administration of intravenous contrast.  Contrast: 80mL OMNIPAQUE IOHEXOL 300 MG/ML  SOLN  Comparison: 09/14/2007.  Findings: The lung bases are clear.  Extensive coronary artery calcifications are noted.  No pericardial effusion.  A small left  hepatic lobe cyst is stable.  No worrisome hepatic lesions or intrahepatic ductal dilatation.  The gallbladder is normal.  No common bile duct dilatation.  The pancreas is normal. The spleen is normal.  The adrenal glands and kidneys are normal.  The stomach is not well distended but no gross abnormalities. Duodenal diverticuli are noted.  The small bowel is unremarkable. The colon demonstrates moderate descending and  sigmoid diverticulosis but no findings for acute diverticulitis.  No mass lesions.  The appendix is normal.  No mesenteric or retroperitoneal masses or lymphadenopathy.  There are moderate atherosclerotic calcifications involving the aorta and branch vessels.  The bladder, prostate gland and seminal vesicles are unremarkable. The prostate gland is slightly enlarged.  No pelvic mass, adenopathy or free pelvic fluid collections.  There are small bilateral inguinal hernias, right greater than left which appears stable.  The bony structures are unremarkable.  IMPRESSION:  1.  No acute abdominal/pelvic findings, mass lesions or lymphadenopathy. 2.  Extensive coronary artery calcifications. 3.  Diverticulosis of the colon without findings for acute diverticulitis.  Original Report Authenticated By: P. Loralie Champagne, M.D.    Scheduled Meds:   . sodium chloride   Intravenous STAT  . gemfibrozil  600 mg Oral BID AC  . hydrochlorothiazide  12.5 mg Oral Daily  . lisinopril  40 mg Oral QHS  . pantoprazole (PROTONIX) IV  40 mg Intravenous Once  . pantoprazole (PROTONIX) IV  40 mg Intravenous QHS  . prednisoLONE acetate  1 drop Left Eye QID  . simvastatin  10 mg Oral q1800  . sodium chloride  1,000 mL Intravenous Once  . sodium chloride  3 mL Intravenous Q12H  . DISCONTD: hydrochlorothiazide  12.5 mg Oral Daily  . DISCONTD: iohexol  20 mL Oral Q1 Hr x 2  . DISCONTD: quinapril  40 mg Oral QHS   Continuous Infusions:   Principal Problem:  *Lower gastrointestinal bleed Active Problems:  MI, old  HTN (hypertension)

## 2012-01-31 NOTE — Progress Notes (Signed)
Pt d.c home given instructions

## 2012-02-18 ENCOUNTER — Other Ambulatory Visit: Payer: Self-pay | Admitting: Cardiology

## 2012-03-30 ENCOUNTER — Other Ambulatory Visit: Payer: Self-pay

## 2012-03-30 MED ORDER — LOVASTATIN 20 MG PO TABS: 20.0000 mg | ORAL_TABLET | Freq: Every day | ORAL | Status: DC

## 2012-04-12 ENCOUNTER — Ambulatory Visit: Payer: Medicare Other | Admitting: Cardiology

## 2012-04-19 ENCOUNTER — Ambulatory Visit (INDEPENDENT_AMBULATORY_CARE_PROVIDER_SITE_OTHER): Payer: Medicare Other | Admitting: Cardiology

## 2012-04-19 ENCOUNTER — Encounter: Payer: Self-pay | Admitting: Cardiology

## 2012-04-19 VITALS — BP 138/80 | HR 81 | Ht 73.0 in | Wt 183.0 lb

## 2012-04-19 DIAGNOSIS — I2510 Atherosclerotic heart disease of native coronary artery without angina pectoris: Secondary | ICD-10-CM

## 2012-04-19 DIAGNOSIS — I10 Essential (primary) hypertension: Secondary | ICD-10-CM

## 2012-04-19 DIAGNOSIS — E785 Hyperlipidemia, unspecified: Secondary | ICD-10-CM

## 2012-04-19 NOTE — Patient Instructions (Signed)
Continue your current therapy  I will see you again in 6 months.   

## 2012-04-19 NOTE — Progress Notes (Signed)
Redmond Baseman Date of Birth: 05/04/1931   History of Present Illness: Mr. John Gamble is seen today for followup. He has a history of coronary disease and is status post stenting of the left circumflex coronary in 2000. He also has a history of hypertension and hyperlipidemia. On followup today he reports that he is feeling very well. He exercises in the gym 4-5 days per week. He has lost 6 pounds since his last visit. He denies any chest pain or shortness of breath. He has had no palpitations. In July he did experience a significant hemorrhoidal bleed. He is currently being treated with a banding procedure by Dr. Kinnie Scales. Aspirin was held for this procedure.  Current Outpatient Prescriptions on File Prior to Visit  Medication Sig Dispense Refill  . gemfibrozil (LOPID) 600 MG tablet TAKE 1 TABLET BY MOUTH TWICE DAILY      BEFORE MEALS.  60 tablet  11  . Glucosamine-Chondroitin (OSTEO BI-FLEX REGULAR STRENGTH PO) Take by mouth 2 (two) times daily.        . hydrochlorothiazide (HYDRODIURIL) 25 MG tablet Take 0.5 tablets (12.5 mg total) by mouth daily.  30 tablet  6  . lovastatin (MEVACOR) 20 MG tablet Take 1 tablet (20 mg total) by mouth at bedtime.  30 tablet  5  . Multiple Vitamin (MULTIVITAMIN) tablet Take 1 tablet by mouth daily.        . Multiple Vitamins-Minerals (ZINC PO) Take by mouth daily.        . Omega-3 Fatty Acids (FISH OIL) 1200 MG CAPS Take 1,200 mg by mouth daily.      . prednisoLONE acetate (PRED FORTE) 1 % ophthalmic suspension Place 1 drop into the left eye 4 (four) times daily.        . quinapril (ACCUPRIL) 40 MG tablet Take 40 mg by mouth at bedtime.        No Known Allergies  Past Medical History  Diagnosis Date  . Coronary artery disease   . MI, old   . Hypertension   . OA (osteoarthritis)   . Hyperlipidemia     Past Surgical History  Procedure Date  . Cardiac catheterization 01/26/1999    EF 55%  . Coronary stent placement 2000    LEFT CIRCUMFLEX CORONARY  .  Replacement total knee   . Inguinal hernia repair   . Cardiovascular stress test 02/22/2010    EF 61%  . Corneal implant     left    History  Smoking status  . Former Smoker -- 0.5 packs/day for 50 years  . Quit date: 02/23/1989  Smokeless tobacco  . Not on file    History  Alcohol Use No    Family History  Problem Relation Age of Onset  . Cancer Mother   . Cancer Father     Review of Systems: As noted in history of present illness.  All other systems were reviewed and are negative.  Physical Exam: BP 138/80  Pulse 81  Ht 6\' 1"  (1.854 m)  Wt 183 lb (83.008 kg)  BMI 24.14 kg/m2 He is a pleasant white male in no acute distress. HEENT normocephalic, atraumatic. Pupils are equal round and reactive to light accommodation. Extraocular movements are full. Oropharynx is clear. Neck is supple no JVD, adenopathy, thyromegaly, or bruits. Lungs are clear. Cardiac exam reveals a regular rate and rhythm without gallop, murmur, or click. Abdomen is soft and nontender. He has no masses or bruits. Femoral and pedal pulses are good. He  has no edema. He is alert and oriented x3. Cranial nerves II through XII are intact. LABORATORY DATA: ECG today demonstrates normal sinus rhythm with an incomplete right bundle branch block. It is otherwise normal. Laboratory data from 04/04/2012 showed a total cholesterol 127, triglycerides 97, HDL 41, LDL 67. A1c was 6.1%.  Assessment / Plan: 1. Coronary disease status post remote stenting of the left circumflex and 2000 with a bare-metal stent. His last nuclear stress test was in July of 2011 and was normal. He remains asymptomatic. We'll continue with his medical therapy and followup again in 6 months. We'll resume aspirin when cleared by GI.  2. Hypertension, controlled.  3. Hyperlipidemia, well controlled.

## 2012-06-13 ENCOUNTER — Other Ambulatory Visit: Payer: Self-pay

## 2012-06-13 MED ORDER — QUINAPRIL HCL 40 MG PO TABS: 40.0000 mg | ORAL_TABLET | Freq: Every day | ORAL | Status: DC

## 2012-07-23 ENCOUNTER — Other Ambulatory Visit: Payer: Self-pay | Admitting: *Deleted

## 2012-07-23 MED ORDER — HYDROCHLOROTHIAZIDE 25 MG PO TABS: 12.5000 mg | ORAL_TABLET | Freq: Every day | ORAL | Status: DC

## 2012-10-08 ENCOUNTER — Telehealth: Payer: Self-pay | Admitting: Cardiology

## 2012-10-08 MED ORDER — LOVASTATIN 20 MG PO TABS: 20.0000 mg | ORAL_TABLET | Freq: Every day | ORAL | Status: DC

## 2012-10-08 NOTE — Telephone Encounter (Signed)
pt's wife calling re request from [facility] for lovastatin, pt now out , pls call asap

## 2012-10-24 ENCOUNTER — Ambulatory Visit: Payer: Medicare Other | Admitting: Cardiology

## 2012-12-03 ENCOUNTER — Other Ambulatory Visit: Payer: Self-pay | Admitting: *Deleted

## 2012-12-03 MED ORDER — QUINAPRIL HCL 40 MG PO TABS: 40.0000 mg | ORAL_TABLET | Freq: Every day | ORAL | Status: DC

## 2012-12-04 ENCOUNTER — Other Ambulatory Visit: Payer: Self-pay | Admitting: *Deleted

## 2012-12-17 ENCOUNTER — Ambulatory Visit (INDEPENDENT_AMBULATORY_CARE_PROVIDER_SITE_OTHER): Payer: Medicare Other | Admitting: Cardiology

## 2012-12-17 ENCOUNTER — Encounter: Payer: Self-pay | Admitting: Cardiology

## 2012-12-17 VITALS — BP 138/80 | HR 96 | Ht 73.0 in | Wt 184.8 lb

## 2012-12-17 DIAGNOSIS — I2510 Atherosclerotic heart disease of native coronary artery without angina pectoris: Secondary | ICD-10-CM

## 2012-12-17 DIAGNOSIS — I10 Essential (primary) hypertension: Secondary | ICD-10-CM

## 2012-12-17 DIAGNOSIS — E785 Hyperlipidemia, unspecified: Secondary | ICD-10-CM

## 2012-12-17 DIAGNOSIS — K922 Gastrointestinal hemorrhage, unspecified: Secondary | ICD-10-CM

## 2012-12-17 DIAGNOSIS — I252 Old myocardial infarction: Secondary | ICD-10-CM

## 2012-12-17 MED ORDER — FENOFIBRATE 145 MG PO TABS: 145.0000 mg | ORAL_TABLET | Freq: Every day | ORAL | Status: DC

## 2012-12-17 NOTE — Progress Notes (Signed)
John Gamble Date of Birth: 01-22-1931   History of Present Illness: John Gamble is seen today for followup. He has a history of coronary disease and is status post stenting of the left circumflex coronary in 2000. He also has a history of hypertension and hyperlipidemia. He continues to do very well. He rides his bike for 30 minutes 5 days a week. He has no chest pain or shortness of breath. He does note that when he was switched to a generic form of Accupril his blood pressure shot up. It is better now on the name brand. He has recently been treated for sinusitis. He does report that he is going to need knee surgery at some point for arthritis.  Current Outpatient Prescriptions on File Prior to Visit  Medication Sig Dispense Refill  . Glucosamine-Chondroitin (OSTEO BI-FLEX REGULAR STRENGTH PO) Take by mouth 2 (two) times daily.        . hydrochlorothiazide (HYDRODIURIL) 25 MG tablet Take 0.5 tablets (12.5 mg total) by mouth daily.  30 tablet  6  . lovastatin (MEVACOR) 20 MG tablet Take 1 tablet (20 mg total) by mouth at bedtime.  30 tablet  5  . Multiple Vitamin (MULTIVITAMIN) tablet Take 1 tablet by mouth daily.        . Multiple Vitamins-Minerals (ZINC PO) Take by mouth daily.        . Omega-3 Fatty Acids (FISH OIL) 1200 MG CAPS Take 1,200 mg by mouth daily.      . prednisoLONE acetate (PRED FORTE) 1 % ophthalmic suspension Place 1 drop into the left eye 4 (four) times daily.        . quinapril (ACCUPRIL) 40 MG tablet Take 1 tablet (40 mg total) by mouth at bedtime.  90 tablet  1   No current facility-administered medications on file prior to visit.    No Known Allergies  Past Medical History  Diagnosis Date  . Coronary artery disease   . MI, old   . Hypertension   . OA (osteoarthritis)   . Hyperlipidemia     Past Surgical History  Procedure Laterality Date  . Cardiac catheterization  01/26/1999    EF 55%  . Coronary stent placement  2000    LEFT CIRCUMFLEX CORONARY  .  Replacement total knee    . Inguinal hernia repair    . Cardiovascular stress test  02/22/2010    EF 61%  . Corneal implant      left    History  Smoking status  . Former Smoker -- 0.50 packs/day for 50 years  . Quit date: 02/23/1989  Smokeless tobacco  . Not on file    History  Alcohol Use No    Family History  Problem Relation Age of Onset  . Cancer Mother   . Cancer Father     Review of Systems: As noted in history of present illness.  All other systems were reviewed and are negative.  Physical Exam: BP 138/80  Pulse 96  Ht 6\' 1"  (1.854 m)  Wt 184 lb 12.8 oz (83.825 kg)  BMI 24.39 kg/m2  SpO2 98% He is a pleasant white male in no acute distress. HEENT normocephalic, atraumatic. Pupils are equal round and reactive to light accommodation. Extraocular movements are full. Oropharynx is clear. Neck is supple no JVD, adenopathy, thyromegaly, or bruits. Lungs are clear. Cardiac exam reveals a regular rate and rhythm without gallop, murmur, or click. Abdomen is soft and nontender. He has no masses or bruits.  Femoral and pedal pulses are good. He has no edema. He is alert and oriented x3. Cranial nerves II through XII are intact. LABORATORY DATA: On 10/10/2012 total cholesterol 138, shortness runs 118, HDL 30, LDL 84. A1c 6.1%.  Assessment / Plan: 1. Coronary disease status post remote stenting of the left circumflex and 2000 with a bare-metal stent. His last nuclear stress test was in July of 2011 and was normal. He remains asymptomatic. We'll continue with his medical therapy and followup again in 6 months.  2. Hypertension, controlled.  3. Hyperlipidemia, well controlled. We have opted to switch him from gemfibrozil to fenofibrate 145 mg daily for safety concern.

## 2012-12-17 NOTE — Patient Instructions (Signed)
Stop gemfibrozil  Start fenofibrate 145 mg daily  Continue your other medication.

## 2013-04-09 ENCOUNTER — Other Ambulatory Visit: Payer: Self-pay

## 2013-04-09 MED ORDER — LOVASTATIN 20 MG PO TABS: 20.0000 mg | ORAL_TABLET | Freq: Every day | ORAL | Status: DC

## 2013-06-06 ENCOUNTER — Other Ambulatory Visit: Payer: Self-pay

## 2013-06-06 MED ORDER — QUINAPRIL HCL 40 MG PO TABS: 40.0000 mg | ORAL_TABLET | Freq: Every day | ORAL | Status: DC

## 2013-06-24 ENCOUNTER — Ambulatory Visit (INDEPENDENT_AMBULATORY_CARE_PROVIDER_SITE_OTHER): Payer: Medicare Other | Admitting: Cardiology

## 2013-06-24 ENCOUNTER — Other Ambulatory Visit: Payer: Medicare Other

## 2013-06-24 ENCOUNTER — Encounter: Payer: Self-pay | Admitting: Cardiology

## 2013-06-24 VITALS — BP 142/70 | HR 57 | Ht 73.0 in | Wt 184.8 lb

## 2013-06-24 DIAGNOSIS — E785 Hyperlipidemia, unspecified: Secondary | ICD-10-CM

## 2013-06-24 DIAGNOSIS — I10 Essential (primary) hypertension: Secondary | ICD-10-CM

## 2013-06-24 DIAGNOSIS — I2510 Atherosclerotic heart disease of native coronary artery without angina pectoris: Secondary | ICD-10-CM

## 2013-06-24 DIAGNOSIS — I252 Old myocardial infarction: Secondary | ICD-10-CM

## 2013-06-24 DIAGNOSIS — K922 Gastrointestinal hemorrhage, unspecified: Secondary | ICD-10-CM

## 2013-06-24 LAB — BASIC METABOLIC PANEL
CO2: 29 mEq/L (ref 19–32)
Chloride: 102 mEq/L (ref 96–112)
Creatinine, Ser: 1.2 mg/dL (ref 0.4–1.5)
Potassium: 4.2 mEq/L (ref 3.5–5.1)
Sodium: 137 mEq/L (ref 135–145)

## 2013-06-24 LAB — LIPID PANEL
Cholesterol: 136 mg/dL (ref 0–200)
HDL: 38.8 mg/dL — ABNORMAL LOW (ref >39.00)
LDL Cholesterol: 76 mg/dL (ref 0–99)
Total CHOL/HDL Ratio: 4
Triglycerides: 106 mg/dL (ref 0.0–149.0)

## 2013-06-24 LAB — HEPATIC FUNCTION PANEL
AST: 24 U/L (ref 0–37)
Albumin: 4 g/dL (ref 3.5–5.2)
Alkaline Phosphatase: 37 U/L — ABNORMAL LOW (ref 39–117)
Total Protein: 6.9 g/dL (ref 6.0–8.3)

## 2013-06-24 NOTE — Progress Notes (Signed)
John Gamble Date of Birth: 1930-10-22   History of Present Illness: Mr. Casale is seen today for followup. He has a history of coronary disease and is status post stenting of the left circumflex coronary in 2000. He had a normal stress Myoview in July of 2011. He is now 79 years old. He also has a history of hypertension and hyperlipidemia. He continues to do very well. He goes to the gym 5 days a week. He has no chest pain or shortness of breath. He has had several injections of his knee for arthritis.  Current Outpatient Prescriptions on File Prior to Visit  Medication Sig Dispense Refill  . fenofibrate (TRICOR) 145 MG tablet Take 1 tablet (145 mg total) by mouth daily.  90 tablet  3  . Glucosamine-Chondroitin (OSTEO BI-FLEX REGULAR STRENGTH PO) Take by mouth 2 (two) times daily.        . hydrochlorothiazide (HYDRODIURIL) 25 MG tablet Take 0.5 tablets (12.5 mg total) by mouth daily.  30 tablet  6  . lovastatin (MEVACOR) 20 MG tablet Take 1 tablet (20 mg total) by mouth at bedtime.  30 tablet  9  . Multiple Vitamin (MULTIVITAMIN) tablet Take 1 tablet by mouth daily.        . Multiple Vitamins-Minerals (ZINC PO) Take by mouth daily.        . Omega-3 Fatty Acids (FISH OIL) 1200 MG CAPS Take 1,200 mg by mouth daily.      . prednisoLONE acetate (PRED FORTE) 1 % ophthalmic suspension Place 1 drop into the left eye 4 (four) times daily.        . quinapril (ACCUPRIL) 40 MG tablet Take 1 tablet (40 mg total) by mouth at bedtime.  90 tablet  1   No current facility-administered medications on file prior to visit.    No Known Allergies  Past Medical History  Diagnosis Date  . Coronary artery disease   . MI, old   . Hypertension   . OA (osteoarthritis)   . Hyperlipidemia     Past Surgical History  Procedure Laterality Date  . Cardiac catheterization  01/26/1999    EF 55%  . Coronary stent placement  2000    LEFT CIRCUMFLEX CORONARY  . Replacement total knee    . Inguinal hernia repair     . Cardiovascular stress test  02/22/2010    EF 61%  . Corneal implant      left    History  Smoking status  . Former Smoker -- 0.50 packs/day for 50 years  . Quit date: 02/23/1989  Smokeless tobacco  . Not on file    History  Alcohol Use No    Family History  Problem Relation Age of Onset  . Cancer Mother   . Cancer Father     Review of Systems: As noted in history of present illness.  All other systems were reviewed and are negative.  Physical Exam: BP 142/70  Pulse 57  Ht 6\' 1"  (1.854 m)  Wt 184 lb 12.8 oz (83.825 kg)  BMI 24.39 kg/m2 He is a pleasant white male in no acute distress. HEENT normocephalic, atraumatic. Pupils are equal round and reactive to light accommodation. Extraocular movements are full. Oropharynx is clear. Neck is supple no JVD, adenopathy, thyromegaly, or bruits. Lungs are clear. Cardiac exam reveals a regular rate and rhythm without gallop, murmur, or click. Abdomen is soft and nontender. He has no masses or bruits. Femoral and pedal pulses are good. He has  no edema. He is alert and oriented x3. Cranial nerves II through XII are intact. LABORATORY DATA: Pending today  Ecg: NSR with incomplete RBBB, otherwise normal.  Assessment / Plan: 1. Coronary disease status post remote stenting of the left circumflex and 2000 with a bare-metal stent. His last nuclear stress test was in July of 2011 and was normal. He remains asymptomatic. We'll continue with his medical therapy and followup again in 6 months.  2. Hypertension, controlled.  3. Hyperlipidemia, well controlled. We will check fasting lab work today including chemistries and lipid panel.

## 2013-06-24 NOTE — Addendum Note (Signed)
Addended by: Tonita Phoenix on: 06/24/2013 09:08 AM   Modules accepted: Orders

## 2013-06-24 NOTE — Patient Instructions (Signed)
We will call with the results of your lab work today  I will see you in 6 months. 

## 2013-09-09 ENCOUNTER — Other Ambulatory Visit: Payer: Self-pay

## 2013-09-09 MED ORDER — HYDROCHLOROTHIAZIDE 25 MG PO TABS: 12.5000 mg | ORAL_TABLET | Freq: Every day | ORAL | Status: DC

## 2013-09-18 ENCOUNTER — Telehealth: Payer: Self-pay | Admitting: Cardiology

## 2013-09-18 NOTE — Telephone Encounter (Signed)
Walk In pt Form " Clearance paper" dropped Off gave to [facility] 2/19 Off on 2.18

## 2013-09-20 ENCOUNTER — Telehealth: Payer: Self-pay | Admitting: Cardiology

## 2013-09-20 NOTE — Telephone Encounter (Signed)
Received request from Nurse fax box, documents faxed for surgical clearance. To: [employer] Fax number: [phone] Attention: [id number]/kdm

## 2013-10-17 ENCOUNTER — Telehealth: Payer: Self-pay | Admitting: Cardiology

## 2013-10-17 NOTE — Telephone Encounter (Signed)
New message    Office calling    Status of cardiac clearance.

## 2013-10-17 NOTE — Telephone Encounter (Signed)
Returned call to [place] at [employer] office no answer.[place].

## 2013-10-17 NOTE — Telephone Encounter (Signed)
Spoke to [place] at [place] she stated patient needs a cardiac clearance for up coming total knee replacement.Stated she will fax a form for Dr.Jordan to sign.

## 2013-10-18 NOTE — Telephone Encounter (Signed)
Dr.Jordan cleared patient for up coming surgery.Form faxed back to [place] at [facility].

## 2013-10-21 ENCOUNTER — Telehealth: Payer: Self-pay | Admitting: Cardiology

## 2013-10-21 NOTE — Telephone Encounter (Signed)
Received request from Nurse fax box, documents faxed for surgical clearance. To: [employer] Fax number: [phone] Attention: [id number]/kdm

## 2013-10-24 ENCOUNTER — Other Ambulatory Visit: Payer: Self-pay | Admitting: Orthopedic Surgery

## 2013-12-06 ENCOUNTER — Other Ambulatory Visit: Payer: Self-pay

## 2013-12-06 MED ORDER — QUINAPRIL HCL 40 MG PO TABS: 40.0000 mg | ORAL_TABLET | Freq: Every day | ORAL | Status: DC

## 2013-12-06 MED ORDER — FENOFIBRATE 145 MG PO TABS: 145.0000 mg | ORAL_TABLET | Freq: Every day | ORAL | Status: DC

## 2013-12-24 ENCOUNTER — Ambulatory Visit: Payer: Medicare Other | Admitting: Cardiology

## 2013-12-25 ENCOUNTER — Encounter (HOSPITAL_COMMUNITY): Payer: Self-pay | Admitting: Emergency Medicine

## 2013-12-25 ENCOUNTER — Emergency Department (HOSPITAL_COMMUNITY): Payer: Medicare Other

## 2013-12-25 ENCOUNTER — Emergency Department (HOSPITAL_COMMUNITY)
Admission: EM | Admit: 2013-12-25 | Discharge: 2013-12-25 | Disposition: A | Discharge status: Home / Self Care | Payer: Medicare Other | Attending: Emergency Medicine | Admitting: Emergency Medicine

## 2013-12-25 DIAGNOSIS — Z8619 Personal history of other infectious and parasitic diseases: Secondary | ICD-10-CM | POA: Insufficient documentation

## 2013-12-25 DIAGNOSIS — H18899 Other specified disorders of cornea, unspecified eye: Secondary | ICD-10-CM | POA: Insufficient documentation

## 2013-12-25 DIAGNOSIS — Z7982 Long term (current) use of aspirin: Secondary | ICD-10-CM | POA: Insufficient documentation

## 2013-12-25 DIAGNOSIS — I10 Essential (primary) hypertension: Secondary | ICD-10-CM | POA: Insufficient documentation

## 2013-12-25 DIAGNOSIS — Z87891 Personal history of nicotine dependence: Secondary | ICD-10-CM | POA: Insufficient documentation

## 2013-12-25 DIAGNOSIS — Z79899 Other long term (current) drug therapy: Secondary | ICD-10-CM | POA: Insufficient documentation

## 2013-12-25 DIAGNOSIS — I2510 Atherosclerotic heart disease of native coronary artery without angina pectoris: Secondary | ICD-10-CM | POA: Insufficient documentation

## 2013-12-25 DIAGNOSIS — H189 Unspecified disorder of cornea: Secondary | ICD-10-CM

## 2013-12-25 DIAGNOSIS — E785 Hyperlipidemia, unspecified: Secondary | ICD-10-CM | POA: Insufficient documentation

## 2013-12-25 DIAGNOSIS — H5712 Ocular pain, left eye: Secondary | ICD-10-CM

## 2013-12-25 DIAGNOSIS — Z9889 Other specified postprocedural states: Secondary | ICD-10-CM | POA: Insufficient documentation

## 2013-12-25 DIAGNOSIS — R112 Nausea with vomiting, unspecified: Secondary | ICD-10-CM

## 2013-12-25 DIAGNOSIS — I252 Old myocardial infarction: Secondary | ICD-10-CM | POA: Insufficient documentation

## 2013-12-25 DIAGNOSIS — Z947 Corneal transplant status: Secondary | ICD-10-CM

## 2013-12-25 DIAGNOSIS — IMO0002 Reserved for concepts with insufficient information to code with codable children: Secondary | ICD-10-CM | POA: Insufficient documentation

## 2013-12-25 DIAGNOSIS — Z9861 Coronary angioplasty status: Secondary | ICD-10-CM | POA: Insufficient documentation

## 2013-12-25 DIAGNOSIS — M1990 Unspecified osteoarthritis, unspecified site: Secondary | ICD-10-CM | POA: Insufficient documentation

## 2013-12-25 LAB — URINE MICROSCOPIC-ADD ON

## 2013-12-25 LAB — CBC WITH DIFFERENTIAL/PLATELET
Basophils Absolute: 0 10*3/uL (ref 0.0–0.1)
Basophils Relative: 1 % (ref 0–1)
EOS PCT: 0 % (ref 0–5)
Eosinophils Absolute: 0 10*3/uL (ref 0.0–0.7)
HEMATOCRIT: 38.5 % — AB (ref 39.0–52.0)
Hemoglobin: 13.4 g/dL (ref 13.0–17.0)
LYMPHS ABS: 0.7 10*3/uL (ref 0.7–4.0)
LYMPHS PCT: 15 % (ref 12–46)
MCH: 30 pg (ref 26.0–34.0)
MCHC: 34.8 g/dL (ref 30.0–36.0)
MCV: 86.3 fL (ref 78.0–100.0)
MONO ABS: 0.2 10*3/uL (ref 0.1–1.0)
Monocytes Relative: 5 % (ref 3–12)
Neutro Abs: 3.5 10*3/uL (ref 1.7–7.7)
Neutrophils Relative %: 79 % — ABNORMAL HIGH (ref 43–77)
Platelets: 181 10*3/uL (ref 150–400)
RBC: 4.46 MIL/uL (ref 4.22–5.81)
RDW: 13.5 % (ref 11.5–15.5)
WBC: 4.4 10*3/uL (ref 4.0–10.5)

## 2013-12-25 LAB — URINALYSIS, ROUTINE W REFLEX MICROSCOPIC
BILIRUBIN URINE: NEGATIVE
Glucose, UA: 100 mg/dL — AB
Ketones, ur: NEGATIVE mg/dL
Leukocytes, UA: NEGATIVE
Nitrite: NEGATIVE
PROTEIN: 100 mg/dL — AB
Specific Gravity, Urine: 1.029 (ref 1.005–1.030)
UROBILINOGEN UA: 1 mg/dL (ref 0.0–1.0)
pH: 5 (ref 5.0–8.0)

## 2013-12-25 LAB — COMPREHENSIVE METABOLIC PANEL
ALT: 43 U/L (ref 0–53)
AST: 50 U/L — ABNORMAL HIGH (ref 0–37)
Albumin: 3.3 g/dL — ABNORMAL LOW (ref 3.5–5.2)
Alkaline Phosphatase: 50 U/L (ref 39–117)
BUN: 18 mg/dL (ref 6–23)
CO2: 24 mEq/L (ref 19–32)
Calcium: 9.4 mg/dL (ref 8.4–10.5)
Chloride: 93 mEq/L — ABNORMAL LOW (ref 96–112)
Creatinine, Ser: 1.12 mg/dL (ref 0.50–1.35)
GFR calc Af Amer: 69 mL/min — ABNORMAL LOW (ref >90)
GFR calc non Af Amer: 60 mL/min — ABNORMAL LOW (ref >90)
Glucose, Bld: 191 mg/dL — ABNORMAL HIGH (ref 70–99)
Potassium: 3.9 mEq/L (ref 3.7–5.3)
Sodium: 130 mEq/L — ABNORMAL LOW (ref 137–147)
Total Bilirubin: 0.6 mg/dL (ref 0.3–1.2)
Total Protein: 6.6 g/dL (ref 6.0–8.3)

## 2013-12-25 LAB — LIPASE, BLOOD: Lipase: 48 U/L (ref 11–59)

## 2013-12-25 MED ORDER — FLUORESCEIN SODIUM 1 MG OP STRP
1.0000 | ORAL_STRIP | Freq: Once | OPHTHALMIC | Status: AC
  Administered 2013-12-25: 1 via OPHTHALMIC
  Filled 2013-12-25: qty 1

## 2013-12-25 MED ORDER — ONDANSETRON HCL 4 MG PO TABS: 4.0000 mg | ORAL_TABLET | Freq: Four times a day (QID) | ORAL | Status: DC

## 2013-12-25 MED ORDER — ONDANSETRON 4 MG PO TBDP
4.0000 mg | ORAL_TABLET | Freq: Once | ORAL | Status: AC
  Administered 2013-12-25: 4 mg via ORAL
  Filled 2013-12-25: qty 1

## 2013-12-25 MED ORDER — TETRACAINE HCL 0.5 % OP SOLN
1.0000 [drp] | Freq: Once | OPHTHALMIC | Status: AC
  Administered 2013-12-25: 1 [drp] via OPHTHALMIC
  Filled 2013-12-25: qty 2

## 2013-12-25 MED ORDER — FAMOTIDINE IN NACL 20-0.9 MG/50ML-% IV SOLN
20.0000 mg | Freq: Once | INTRAVENOUS | Status: AC
  Administered 2013-12-25: 20 mg via INTRAVENOUS
  Filled 2013-12-25: qty 50

## 2013-12-25 NOTE — ED Notes (Addendum)
Pt reports he has had fever, nausea, and dry heaves since April; has seen several doctors for it, has lost 10 pounds. Has had several rounds of antibiotics for cough. Reports right eye problem starting a few weeks ago. Drainage noted. Hx of rejected corneal inplant 6 years ago. Reports occasional SOB but still is able to exercise; denies chest pain

## 2013-12-25 NOTE — ED Notes (Signed)
Patient transported to X-ray 

## 2013-12-25 NOTE — ED Notes (Addendum)
Pt c/o left eye drainage and pain, sts 3 weeks ago is when it started. At this same time pt was experiencing some nasal drainage. Ever since he had a corneal repair in that eye he puts a drop of prednisone every night. Recently put on antibiotic in that eye and just started using it yesterday, was seen this past Monday in [place]. Stopped taking the prednisone for now. Pt reports the eye pain has increased and having trouble seeing out of his eye. 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   Pt has also had some nausea, fevers and chills x 2 nights. Pt had recent dx with pharyngitis and put on antibiotics. Pt reports he has checked his fever on and off and noticed it to be up to 100 at some points, would take tylenol and the fever would go down. Pt sts he has lost about 10 lbs, wife reports that he hasn't been eating much. LBM this morning, denies dirreahea.

## 2013-12-25 NOTE — ED Provider Notes (Signed)
CSN: [id number]     Arrival date & time 12/25/13  7902 History   First MD Initiated Contact with Patient 12/25/13 [phone]     Chief Complaint  Patient presents with  . Eye Problem  . Nausea     (Consider location/radiation/quality/duration/timing/severity/associated sxs/prior Treatment) HPI  John Gamble is a(n) 80 y.o. male who presents to the emergency department, with chief complaint of left eye pain and discharge, as well as nausea and vomiting.  Patient has a past medical history of previous MI, hypertension, herpes zoster infection of the left thigh.  Has a surgical history of corneal transplant in 2007.  The patient has been on daily.  See, your eye drops, once a day, for the past 8 years.  3, weeks, ago, the patient developed left eye mattering, discharge, change in vision, photophobia.  He also develops, time, and simultaneous, cough, sinus pressure, sore throat.  Patient was seen  Late April for sinus congestion  At urgent care, and given prednisone, and azithromycin, and for laryngeal tracheobronchitis.  Patient was seen about 2 weeks later and again.  Given azithromycin.  Patient returned with the same complaints and was started on bactrim.  Patient has had daily nausea, vomiting, since that, time.  He continues to have chronic productive cough.  Patient has been running intermittent fevers for the past, week, as high as 100.7.  He's been taking Tylenol for relief. The patient states, that he was seen by a PA, at an urgent care in [facility], where he was told to discontinue his prednisone drops, and started on Vigamox.  His only been on this medication for one day.  He complains, that he's been unable to hold down any of his medications for his hypertension and hyperlipidemia.  At home.  Due to vomiting.  Past Medical History  Diagnosis Date  . Coronary artery disease   . MI, old   . Hypertension   . OA (osteoarthritis)   . Hyperlipidemia    Past Surgical History   Procedure Laterality Date  . Cardiac catheterization  01/26/1999    EF 55%  . Coronary stent placement  2000    LEFT CIRCUMFLEX CORONARY  . Replacement total knee    . Inguinal hernia repair    . Cardiovascular stress test  02/22/2010    EF 61%  . Corneal implant      left   Family History  Problem Relation Age of Onset  . Cancer Mother   . Cancer Father    History  Substance Use Topics  . Smoking status: Former Smoker -- 0.50 packs/day for 50 years    Quit date: 02/23/1989  . Smokeless tobacco: Not on file  . Alcohol Use: No    Review of Systems  Ten systems reviewed and are negative for acute change, except as noted in the HPI.    Allergies  Sulfa antibiotics  Home Medications   Prior to Admission medications   Medication Sig Start Date End Date Taking? Authorizing Provider  aspirin 81 MG tablet Take 81 mg by mouth daily.    Historical Provider, MD  fenofibrate (TRICOR) 145 MG tablet Take 1 tablet (145 mg total) by mouth daily. 12/06/13   Peter M [name], MD  Glucosamine-Chondroitin (OSTEO BI-FLEX REGULAR STRENGTH PO) Take by mouth 2 (two) times daily.      Historical Provider, MD  hydrochlorothiazide (HYDRODIURIL) 25 MG tablet Take 0.5 tablets (12.5 mg total) by mouth daily. 09/09/13   Peter M [name], MD  lovastatin (MEVACOR) 20 MG tablet Take 1 tablet (20 mg total) by mouth at bedtime. 04/09/13   Peter M [name], MD  Multiple Vitamin (MULTIVITAMIN) tablet Take 1 tablet by mouth daily.      Historical Provider, MD  Multiple Vitamins-Minerals (ZINC PO) Take by mouth daily.      Historical Provider, MD  Omega-3 Fatty Acids (FISH OIL) 1200 MG CAPS Take 1,200 mg by mouth daily.    Historical Provider, MD  Polyethylene Glycol 3350 (MIRALAX PO) Take by mouth as directed.    Historical Provider, MD  prednisoLONE acetate (PRED FORTE) 1 % ophthalmic suspension Place 1 drop into the left eye 4 (four) times daily.      Historical Provider, MD  quinapril (ACCUPRIL) 40 MG tablet Take 1  tablet (40 mg total) by mouth at bedtime. 12/06/13   Peter M [name], MD   BP 126/80  Pulse 123  Temp(Src) 98.1 F (36.7 C) (Oral)  Resp 18  SpO2 93% Physical Exam  Nursing note and vitals reviewed. Constitutional: He appears well-developed and well-nourished. No distress.  HENT:  Head: Normocephalic and atraumatic.  Eyes: Conjunctivae are normal. No scleral icterus.  Visual acuity OD 20/30; OS 20/400 Pressures: OD 14; OS 12 EOMI/ PERRL Left eye examined: Pain with direct light. Eye is markedly injected. With chemosis and rough opacities on the cornea. There is vibrant fluorescein uptake over the whole of the cornea with few punctate areas of uptake on the surrounding sclera.     Neck: Normal range of motion. Neck supple.  Cardiovascular: Normal rate, regular rhythm and normal heart sounds.   Pulmonary/Chest: Effort normal and breath sounds normal. No respiratory distress.  Abdominal: Soft. He exhibits no distension and no mass. There is no tenderness. There is no guarding.  Musculoskeletal: He exhibits no edema.  Neurological: He is alert.  Skin: Skin is warm and dry. He is not diaphoretic.  Psychiatric: His behavior is normal.    ED Course  Procedures (including critical care time) Labs Review Labs Reviewed  COMPREHENSIVE METABOLIC PANEL  CBC WITH DIFFERENTIAL  LIPASE, BLOOD  URINALYSIS, ROUTINE W REFLEX MICROSCOPIC    Imaging Review Dg Chest 2 View  12/25/2013   CLINICAL DATA:  Nausea for 3 months.  Shortness of breath.  EXAM: CHEST  2 VIEW  COMPARISON:  PA and lateral chest 11/01/2007.  FINDINGS: The lungs are clear. Heart size is normal. No pneumothorax or pleural effusion. No focal bony abnormality.  IMPRESSION: No acute disease.   Electronically Signed   By: Inge Rise M.D.   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 Including N/V and abdominal complaints. Concern for infection/ graft rejection. Unable to visualize dendritic lesions if present.  I have placed a call to consult his eye Surgeon in [facility] Dr. Kizzie Furnish.   Patient with UA abnormalities. No concern for infection. Mild hyponatremia/ hypochlormia likely secondary to elevated glucose. No leukocytosis. Benign abdominal exam. cxr without abnormality. i believe patients nausea is likely secondary to 4 rounds of abx and i am concened that his low grade fever is because of his eye.  Filed Vitals:   12/25/13 1230 12/25/13 1300 12/25/13 1330 12/25/13 1400  BP: 113/56 116/55 120/59 115/55  Pulse: 85 86 89 79  Temp:  TempSrc:      Resp: 20 27 18 20   SpO2: 98% 99% 99% 98%    Dr. Laurann Montana has spoken with Dr. Mingo Amber. He has asked that the patient come directly to his office in [address] [place] [address].  i have asked the patient to discontine all abx.  He will be given zofran.  Patient has had no episodes vomiting in ED. VSS. sxs  Of nausea are resolved. Patient / Family / Caregiver informed of clinical course and work up findings to include any labs, imagine, or medical decision-making performed and agree with plan for discharge. Discussed return precautions using teach back method.  He is to leave and go directly to his ophtlamologist.   I personally reviewed the imaging tests through PACS system. I have reviewed and interpreted Lab values. I reviewed available ER/hospitalization records through the [place], [place] 12/26/13 1142

## 2013-12-25 NOTE — Discharge Instructions (Signed)
Please discontinue your oral antibiotics. Go Directly to see Dr. Laurann Montana.  Abdominal (belly) pain can be caused by many things. Your caregiver performed an examination and possibly ordered blood/urine tests and imaging (CT scan, x-rays, ultrasound). Many cases can be observed and treated at home after initial evaluation in the emergency department. Even though you are being discharged home, abdominal pain can be unpredictable. Therefore, you need a repeated exam if your pain does not resolve, returns, or worsens. Most patients with abdominal pain don't have to be admitted to the hospital or have surgery, but serious problems like appendicitis and gallbladder attacks can start out as nonspecific pain. Many abdominal conditions cannot be diagnosed in one visit, so follow-up evaluations are very important. SEEK IMMEDIATE MEDICAL ATTENTION IF: The pain does not go away or becomes severe.  A temperature above 101 develops.  Repeated vomiting occurs (multiple episodes).  The pain becomes localized to portions of the abdomen. The right side could possibly be appendicitis. In an adult, the left lower portion of the abdomen could be colitis or diverticulitis.  Blood is being passed in stools or vomit (bright red or black tarry stools).  Return also if you develop chest pain, difficulty breathing, dizziness or fainting, or become confused, poorly responsive, or inconsolable (young children).   Nausea and Vomiting Nausea is a sick feeling that often comes before throwing up (vomiting). Vomiting is a reflex where stomach contents come out of your mouth. Vomiting can cause severe loss of body fluids (dehydration). Children and elderly adults can become dehydrated quickly, especially if they also have diarrhea. Nausea and vomiting are symptoms of a condition or disease. It is important to find the cause of your symptoms. CAUSES   Direct irritation of the stomach lining. This irritation can result from increased  acid production (gastroesophageal reflux disease), infection, food poisoning, taking certain medicines (such as nonsteroidal anti-inflammatory drugs), alcohol use, or tobacco use.  Signals from the brain.These signals could be caused by a headache, heat exposure, an inner ear disturbance, increased pressure in the brain from injury, infection, a tumor, or a concussion, pain, emotional stimulus, or metabolic problems.  An obstruction in the gastrointestinal tract (bowel obstruction).  Illnesses such as diabetes, hepatitis, gallbladder problems, appendicitis, kidney problems, cancer, sepsis, atypical symptoms of a heart attack, or eating disorders.  Medical treatments such as chemotherapy and radiation.  Receiving medicine that makes you sleep (general anesthetic) during surgery. DIAGNOSIS Your caregiver may ask for tests to be done if the problems do not improve after a few days. Tests may also be done if symptoms are severe or if the reason for the nausea and vomiting is not clear. Tests may include:  Urine tests.  Blood tests.  Stool tests.  Cultures (to look for evidence of infection).  X-rays or other imaging studies. Test results can help your caregiver make decisions about treatment or the need for additional tests. TREATMENT You need to stay well hydrated. Drink frequently but in small amounts.You may wish to drink water, sports drinks, clear broth, or eat frozen ice pops or gelatin dessert to help stay hydrated.When you eat, eating slowly may help prevent nausea.There are also some antinausea medicines that may help prevent nausea. HOME CARE INSTRUCTIONS   Take all medicine as directed by your caregiver.  If you do not have an appetite, do not force yourself to eat. However, you must continue to drink fluids.  If you have an appetite, eat a normal diet unless your caregiver tells you  differently.  Eat a variety of complex carbohydrates (rice, wheat, potatoes, bread), lean  meats, yogurt, fruits, and vegetables.  Avoid high-fat foods because they are more difficult to digest.  Drink enough water and fluids to keep your urine clear or pale yellow.  If you are dehydrated, ask your caregiver for specific rehydration instructions. Signs of dehydration may include:  Severe thirst.  Dry lips and mouth.  Dizziness.  Dark urine.  Decreasing urine frequency and amount.  Confusion.  Rapid breathing or pulse. SEEK IMMEDIATE MEDICAL CARE IF:   You have blood or brown flecks (like coffee grounds) in your vomit.  You have black or bloody stools.  You have a severe headache or stiff neck.  You are confused.  You have severe abdominal pain.  You have chest pain or trouble breathing.  You do not urinate at least once every 8 hours.  You develop cold or clammy skin.  You continue to vomit for longer than 24 to 48 hours.  You have a fever. MAKE SURE YOU:   Understand these instructions.  Will watch your condition.  Will get help right away if you are not doing well or get worse. Document Released: 07/18/2005 Document Revised: 10/10/2011 Document Reviewed: 12/15/2010 [facility] Patient Information 2014 [place], [place].

## 2013-12-25 NOTE — ED Notes (Signed)
Pt returned from radiology.

## 2013-12-25 NOTE — ED Notes (Signed)
Pt taken to radiology from triage. Wife brought to pt's room and radiology notified to bring pt to room when finished.

## 2013-12-26 ENCOUNTER — Ambulatory Visit: Payer: Medicare Other | Admitting: Cardiology

## 2013-12-27 ENCOUNTER — Other Ambulatory Visit: Payer: Self-pay | Admitting: Orthopedic Surgery

## 2013-12-27 NOTE — ED Provider Notes (Signed)
Medical screening examination/treatment/procedure(s) were conducted as a shared visit with non-physician practitioner(s) and myself.  I personally evaluated the patient during the encounter.   EKG Interpretation None      Patient here with L eye pain. Hx of corneal transplant 8 years ago. L eye pain for past 3 weeks, has seen PAs for it recently, was taken off steroid drops and put on Vigamox. Patient has L corneal with diffuse uptake of fluorescein. I spoke with Dr. Laurann Montana, patient's Ophthalmologist who performed his procedure. He was concerned for possible rejection of the cornea. Patient discharge and instructed to go straight to Dr. Delene Ruffini office in [place]. Patient and wife are aware of plan.   Osvaldo Shipper, MD 12/27/13 1901

## 2014-01-01 ENCOUNTER — Inpatient Hospital Stay (HOSPITAL_COMMUNITY): Admission: RE | Admit: 2014-01-01 | Payer: Medicare Other | Source: Ambulatory Visit

## 2014-01-09 ENCOUNTER — Encounter (HOSPITAL_COMMUNITY): Admission: RE | Payer: Self-pay | Source: Ambulatory Visit

## 2014-01-09 ENCOUNTER — Inpatient Hospital Stay (HOSPITAL_COMMUNITY): Admission: RE | Admit: 2014-01-09 | Payer: Medicare Other | Source: Ambulatory Visit | Admitting: Specialist

## 2014-01-09 SURGERY — ARTHROPLASTY, KNEE, TOTAL
Anesthesia: Spinal | Site: Knee | Laterality: Left

## 2014-01-14 ENCOUNTER — Other Ambulatory Visit: Payer: Self-pay

## 2014-01-14 MED ORDER — LOVASTATIN 20 MG PO TABS: 20.0000 mg | ORAL_TABLET | Freq: Every day | ORAL | Status: DC

## 2014-01-14 MED ORDER — QUINAPRIL HCL 40 MG PO TABS: 40.0000 mg | ORAL_TABLET | Freq: Every day | ORAL | Status: DC

## 2014-01-14 MED ORDER — FENOFIBRATE 145 MG PO TABS: 145.0000 mg | ORAL_TABLET | Freq: Every day | ORAL | Status: DC

## 2014-04-02 ENCOUNTER — Encounter: Payer: Self-pay | Admitting: Cardiology

## 2014-04-02 ENCOUNTER — Ambulatory Visit (INDEPENDENT_AMBULATORY_CARE_PROVIDER_SITE_OTHER): Payer: Medicare Other | Admitting: Cardiology

## 2014-04-02 VITALS — BP 128/86 | HR 62 | Ht 73.0 in | Wt 184.0 lb

## 2014-04-02 DIAGNOSIS — E785 Hyperlipidemia, unspecified: Secondary | ICD-10-CM

## 2014-04-02 DIAGNOSIS — I2510 Atherosclerotic heart disease of native coronary artery without angina pectoris: Secondary | ICD-10-CM

## 2014-04-02 DIAGNOSIS — I10 Essential (primary) hypertension: Secondary | ICD-10-CM

## 2014-04-02 NOTE — Progress Notes (Signed)
John Gamble Date of Birth: Dec 06, 1930   History of Present Illness: John Gamble is seen today for followup. He has a history of coronary disease and is status post stenting of the left circumflex coronary in 2000. He had a normal stress Myoview in July of 2011. He also has a history of hypertension and hyperlipidemia. He continues to do very well from a cardiac standpoint. He goes to the gym 5 days a week. He has no chest pain or shortness of breath. He has had a number of problems since his last visit. These include Zoster infection of prior corneal transplant. He had laryngitis and sinusitis. His knee surgery was postponed.  Current Outpatient Prescriptions on File Prior to Visit  Medication Sig Dispense Refill  . aspirin 81 MG tablet Take 81 mg by mouth daily.      . fenofibrate (TRICOR) 145 MG tablet Take 1 tablet (145 mg total) by mouth daily.  30 tablet  2  . Glucosamine-Chondroitin (OSTEO BI-FLEX REGULAR STRENGTH PO) Take 2 tablets by mouth 2 (two) times daily.       . hydrochlorothiazide (HYDRODIURIL) 25 MG tablet Take 0.5 tablets (12.5 mg total) by mouth daily.  30 tablet  6  . lovastatin (MEVACOR) 20 MG tablet Take 1 tablet (20 mg total) by mouth at bedtime.  30 tablet  2  . Multiple Vitamin (MULTIVITAMIN WITH MINERALS) TABS tablet Take 1 tablet by mouth daily.      . Omega-3 Fatty Acids (FISH OIL) 1200 MG CAPS Take 1,200 mg by mouth daily.      . Polyethylene Glycol 3350 (MIRALAX PO) Take 17 g by mouth daily.       . prednisoLONE acetate (PRED FORTE) 1 % ophthalmic suspension Place 1 drop into the left eye at bedtime.       . quinapril (ACCUPRIL) 40 MG tablet Take 1 tablet (40 mg total) by mouth at bedtime.  30 tablet  2  . Zinc 50 MG CAPS Take 50 mg by mouth daily.       No current facility-administered medications on file prior to visit.    Allergies  Allergen Reactions  . Oysters [Shellfish Allergy] Nausea Only  . Sulfa Antibiotics Nausea And Vomiting    Past Medical  History  Diagnosis Date  . Coronary artery disease   . MI, old   . Hypertension   . OA (osteoarthritis)   . Hyperlipidemia     Past Surgical History  Procedure Laterality Date  . Cardiac catheterization  01/26/1999    EF 55%  . Coronary stent placement  2000    LEFT CIRCUMFLEX CORONARY  . Replacement total knee    . Inguinal hernia repair    . Cardiovascular stress test  02/22/2010    EF 61%  . Corneal implant      left    History  Smoking status  . Former Smoker -- 0.50 packs/day for 50 years  . Quit date: 02/23/1989  Smokeless tobacco  . Not on file    History  Alcohol Use No    Family History  Problem Relation Age of Onset  . Cancer Mother   . Cancer Father     Review of Systems: As noted in history of present illness.  All other systems were reviewed and are negative.  Physical Exam: BP 128/86  Pulse 62  Ht 6\' 1"  (1.854 m)  Wt 184 lb (83.462 kg)  BMI 24.28 kg/m2 He is a pleasant white male in no  acute distress. HEENT normocephalic, atraumatic. Pupils are equal round and reactive to light accommodation. Extraocular movements are full. Oropharynx is clear. Neck is supple no JVD, adenopathy, thyromegaly, or bruits. Lungs are clear. Cardiac exam reveals a regular rate and rhythm without gallop, murmur, or click. Abdomen is soft and nontender. He has no masses or bruits. Femoral and pedal pulses are good. He has no edema. He is alert and oriented x3. Cranial nerves II through XII are intact. He walks with a limp.  LABORATORY DATA:   Assessment / Plan: 1. Coronary disease status post remote stenting of the left circumflex and 2000 with a bare-metal stent. His last nuclear stress test was in July of 2011 and was normal. He remains asymptomatic. We'll continue with his medical therapy and followup again in 6 months.  2. Hypertension, controlled.  3. Hyperlipidemia, well controlled. We will check fasting lab work today on next visit.

## 2014-04-02 NOTE — Patient Instructions (Signed)
Continue your current therapy  We will see you in 6 months    

## 2014-04-15 ENCOUNTER — Other Ambulatory Visit: Payer: Self-pay | Admitting: *Deleted

## 2014-04-15 MED ORDER — FENOFIBRATE 145 MG PO TABS: 145.0000 mg | ORAL_TABLET | Freq: Every day | ORAL | Status: DC

## 2014-04-15 MED ORDER — LOVASTATIN 20 MG PO TABS: 20.0000 mg | ORAL_TABLET | Freq: Every day | ORAL | Status: DC

## 2014-04-15 MED ORDER — QUINAPRIL HCL 40 MG PO TABS: 40.0000 mg | ORAL_TABLET | Freq: Every day | ORAL | Status: DC

## 2014-06-30 ENCOUNTER — Telehealth: Payer: Self-pay

## 2014-06-30 NOTE — Telephone Encounter (Signed)
Received surgical clearance form from [facility].Dr.Jordan cleared patient for surgery.Form faxed back to fax # [phone].

## 2014-08-06 ENCOUNTER — Other Ambulatory Visit: Payer: Self-pay | Admitting: Orthopedic Surgery

## 2014-08-25 NOTE — H&P (Signed)
TOTAL KNEE ADMISSION H&P  Patient is being admitted for left total knee arthroplasty.  Subjective:  Chief Complaint:left knee pain.  HPI: John Gamble, 81 y.o. male, has a history of pain and functional disability in the left knee due to arthritis and has failed non-surgical conservative treatments for greater than 12 weeks to includeNSAID's and/or analgesics, corticosteriod injections, viscosupplementation injections, supervised PT with diminished ADL's post treatment, use of assistive devices, weight reduction as appropriate and activity modification.  Onset of symptoms was gradual, starting 5 years ago with gradually worsening course since that time. The patient noted no past surgery on the left knee(s).  Patient currently rates pain in the left knee(s) at 7 out of 10 with activity. Patient has night pain, worsening of pain with activity and weight bearing, pain that interferes with activities of daily living, pain with passive range of motion and joint swelling.  Patient has evidence of subchondral sclerosis, periarticular osteophytes and joint space narrowing by imaging studies. This patient has had Osteoarthritis. There is no active infection.  Patient Active Problem List   Diagnosis Date Noted  . Lower gastrointestinal bleed 01/30/2012  . Coronary artery disease   . MI, old   . Hypertension   . Hyperlipidemia    Past Medical History  Diagnosis Date  . Coronary artery disease   . MI, old   . Hypertension   . OA (osteoarthritis)   . Hyperlipidemia     Past Surgical History  Procedure Laterality Date  . Cardiac catheterization  01/26/1999    EF 55%  . Coronary stent placement  2000    LEFT CIRCUMFLEX CORONARY  . Replacement total knee    . Inguinal hernia repair    . Cardiovascular stress test  02/22/2010    EF 61%  . Corneal implant      left    No prescriptions prior to admission   Allergies  Allergen Reactions  . Oysters [Shellfish Allergy] Nausea Only  . Sulfa  Antibiotics Nausea And Vomiting    History  Substance Use Topics  . Smoking status: Former Smoker -- 0.50 packs/day for 50 years    Quit date: 02/23/1989  . Smokeless tobacco: Not on file  . Alcohol Use: No    Family History  Problem Relation Age of Onset  . Cancer Mother   . Cancer Father      Review of Systems  Constitutional: Negative.   HENT: Negative.   Eyes: Negative.   Respiratory: Negative.   Cardiovascular: Negative.   Gastrointestinal: Negative.   Genitourinary: Negative.   Musculoskeletal: Positive for joint pain.  Skin: Negative.   Neurological: Negative.   Endo/Heme/Allergies: Negative.   Psychiatric/Behavioral: Negative.     Objective:  Physical Exam  Constitutional: He is oriented to person, place, and time. He appears well-developed.  HENT:  Head: Normocephalic.  Eyes: EOM are normal.  Neck: Normal range of motion.  Cardiovascular: Normal rate, regular rhythm, normal heart sounds and intact distal pulses.   Respiratory: Effort normal and breath sounds normal.  GI: Soft. Bowel sounds are normal.  Musculoskeletal:  Left knee pain with ROM. LLE N/V intact  Neurological: He is alert and oriented to person, place, and time. He has normal reflexes.  Skin: Skin is warm and dry.  Psychiatric: His behavior is normal.    Vital signs in last 24 hours:    Labs:   Estimated body mass index is 24.28 kg/(m^2) as calculated from the following:   Height as of 04/02/14: 6'  1" (1.854 m).   Weight as of 04/02/14: 83.462 kg (184 lb).   Imaging Review Plain radiographs demonstrate moderate degenerative joint disease of the left knee(s). The overall alignment issignificant varus. The bone quality appears to be good for age and reported activity level.  Assessment/Plan:  End stage arthritis, left knee   The patient history, physical examination, clinical judgment of the provider and imaging studies are consistent with end stage degenerative joint disease of the  left knee(s) and total knee arthroplasty is deemed medically necessary. The treatment options including medical management, injection therapy arthroscopy and arthroplasty were discussed at length. The risks and benefits of total knee arthroplasty were presented and reviewed. The risks due to aseptic loosening, infection, stiffness, patella tracking problems, thromboembolic complications and other imponderables were discussed. The patient acknowledged the explanation, agreed to proceed with the plan and consent was signed. Patient is being admitted for inpatient treatment for surgery, pain control, PT, OT, prophylactic antibiotics, VTE prophylaxis, progressive ambulation and ADL's and discharge planning. The patient is planning to be discharged to [facility] in [place].  Not a canidate for Tranexamic acid, history of MI.

## 2014-09-11 NOTE — Patient Instructions (Signed)
John Gamble  09/11/2014   Your procedure is scheduled on: 09/19/2014    Report to [facility] Main  Entrance and follow signs to               [place] at       1100 AM.  Call this number if you have problems the morning of surgery [phone]   Remember:  Do not eat food or drink liquids :After Midnight.     Take these medicines the morning of surgery with A SIP OF WATER: none                                You may not have any metal on your body including hair pins and              piercings  Do not wear jewelry,  lotions, powders or perfumes., deodorant.                          Men may shave face and neck.   Do not bring valuables to the hospital. [place].  Contacts, dentures or bridgework may not be worn into surgery.  Leave suitcase in the car. After surgery it may be brought to your room.         Special Instructions: coughing and deep breathing exercises, leg exercises               Please read over the following fact sheets you were given: _____________________________________________________________________             [facility] - Preparing for Surgery Before surgery, you can play an important role.  Because skin is not sterile, your skin needs to be as free of germs as possible.  You can reduce the number of germs on your skin by washing with CHG (chlorahexidine gluconate) soap before surgery.  CHG is an antiseptic cleaner which kills germs and bonds with the skin to continue killing germs even after washing. Please DO NOT use if you have an allergy to CHG or antibacterial soaps.  If your skin becomes reddened/irritated stop using the CHG and inform your nurse when you arrive at Short Stay. Do not shave (including legs and underarms) for at least 48 hours prior to the first CHG shower.  You may shave your face/neck. Please follow these instructions carefully:  1.  Shower with CHG  Soap the night before surgery and the  morning of Surgery.  2.  If you choose to wash your hair, wash your hair first as usual with your  normal  shampoo.  3.  After you shampoo, rinse your hair and body thoroughly to remove the  shampoo.                           4.  Use CHG as you would any other liquid soap.  You can apply chg directly  to the skin and wash                       Gently with a scrungie or clean washcloth.  5.  Apply the CHG Soap to your body ONLY  FROM THE NECK DOWN.   Do not use on face/ open                           Wound or open sores. Avoid contact with eyes, ears mouth and genitals (private parts).                       Wash face,  Genitals (private parts) with your normal soap.             6.  Wash thoroughly, paying special attention to the area where your surgery  will be performed.  7.  Thoroughly rinse your body with warm water from the neck down.  8.  DO NOT shower/wash with your normal soap after using and rinsing off  the CHG Soap.                9.  Pat yourself dry with a clean towel.            10.  Wear clean pajamas.            11.  Place clean sheets on your bed the night of your first shower and do not  sleep with pets. Day of Surgery : Do not apply any lotions/deodorants the morning of surgery.  Please wear clean clothes to the hospital/surgery center.  FAILURE TO FOLLOW THESE INSTRUCTIONS MAY RESULT IN THE CANCELLATION OF YOUR SURGERY PATIENT SIGNATURE_________________________________  NURSE SIGNATURE__________________________________  ________________________________________________________________________  WHAT IS A BLOOD TRANSFUSION? Blood Transfusion Information  A transfusion is the replacement of blood or some of its parts. Blood is made up of multiple cells which provide different functions.  Red blood cells carry oxygen and are used for blood loss replacement.  White blood cells fight against infection.  Platelets control  bleeding.  Plasma helps clot blood.  Other blood products are available for specialized needs, such as hemophilia or other clotting disorders. BEFORE THE TRANSFUSION  Who gives blood for transfusions?   Healthy volunteers who are fully evaluated to make sure their blood is safe. This is blood bank blood. Transfusion therapy is the safest it has ever been in the practice of medicine. Before blood is taken from a donor, a complete history is taken to make sure that person has no history of diseases nor engages in risky social behavior (examples are intravenous drug use or sexual activity with multiple partners). The donor's travel history is screened to minimize risk of transmitting infections, such as malaria. The donated blood is tested for signs of infectious diseases, such as HIV and hepatitis. The blood is then tested to be sure it is compatible with you in order to minimize the chance of a transfusion reaction. If you or a relative donates blood, this is often done in anticipation of surgery and is not appropriate for emergency situations. It takes many days to process the donated blood. RISKS AND COMPLICATIONS Although transfusion therapy is very safe and saves many lives, the main dangers of transfusion include:  1. Getting an infectious disease. 2. Developing a transfusion reaction. This is an allergic reaction to something in the blood you were given. Every precaution is taken to prevent this. The decision to have a blood transfusion has been considered carefully by your caregiver before blood is given. Blood is not given unless the benefits outweigh the risks. AFTER THE TRANSFUSION  Right after receiving a blood transfusion, you will usually feel much better  and more energetic. This is especially true if your red blood cells have gotten low (anemic). The transfusion raises the level of the red blood cells which carry oxygen, and this usually causes an energy increase.  The nurse  administering the transfusion will monitor you carefully for complications. HOME CARE INSTRUCTIONS  No special instructions are needed after a transfusion. You may find your energy is better. Speak with your caregiver about any limitations on activity for underlying diseases you may have. SEEK MEDICAL CARE IF:   Your condition is not improving after your transfusion.  You develop redness or irritation at the intravenous (IV) site. SEEK IMMEDIATE MEDICAL CARE IF:  Any of the following symptoms occur over the next 12 hours:  Shaking chills.  You have a temperature by mouth above 102 F (38.9 C), not controlled by medicine.  Chest, back, or muscle pain.  People around you feel you are not acting correctly or are confused.  Shortness of breath or difficulty breathing.  Dizziness and fainting.  You get a rash or develop hives.  You have a decrease in urine output.  Your urine turns a dark color or changes to pink, red, or brown. Any of the following symptoms occur over the next 10 days:  You have a temperature by mouth above 102 F (38.9 C), not controlled by medicine.  Shortness of breath.  Weakness after normal activity.  The white part of the eye turns yellow (jaundice).  You have a decrease in the amount of urine or are urinating less often.  Your urine turns a dark color or changes to pink, red, or brown. Document Released: 07/15/2000 Document Revised: 10/10/2011 Document Reviewed: 03/03/2008 ExitCare Patient Information 2014 [place].  _______________________________________________________________________  Incentive Spirometer  An incentive spirometer is a tool that can help keep your lungs clear and active. This tool measures how well you are filling your lungs with each breath. Taking long deep breaths may help reverse or decrease the chance of developing breathing (pulmonary) problems (especially infection) following:  A long period of time when you are  unable to move or be active. BEFORE THE PROCEDURE   If the spirometer includes an indicator to show your best effort, your nurse or respiratory therapist will set it to a desired goal.  If possible, sit up straight or lean slightly forward. Try not to slouch.  Hold the incentive spirometer in an upright position. INSTRUCTIONS FOR USE  3. Sit on the edge of your bed if possible, or sit up as far as you can in bed or on a chair. 4. Hold the incentive spirometer in an upright position. 5. Breathe out normally. 6. Place the mouthpiece in your mouth and seal your lips tightly around it. 7. Breathe in slowly and as deeply as possible, raising the piston or the ball toward the top of the column. 8. Hold your breath for 3-5 seconds or for as long as possible. Allow the piston or ball to fall to the bottom of the column. 9. Remove the mouthpiece from your mouth and breathe out normally. 10. Rest for a few seconds and repeat Steps 1 through 7 at least 10 times every 1-2 hours when you are awake. Take your time and take a few normal breaths between deep breaths. 11. The spirometer may include an indicator to show your best effort. Use the indicator as a goal to work toward during each repetition. 12. After each set of 10 deep breaths, practice coughing to be sure your  lungs are clear. If you have an incision (the cut made at the time of surgery), support your incision when coughing by placing a pillow or rolled up towels firmly against it. Once you are able to get out of bed, walk around indoors and cough well. You may stop using the incentive spirometer when instructed by your caregiver.  RISKS AND COMPLICATIONS  Take your time so you do not get dizzy or light-headed.  If you are in pain, you may need to take or ask for pain medication before doing incentive spirometry. It is harder to take a deep breath if you are having pain. AFTER USE  Rest and breathe slowly and easily.  It can be helpful to  keep track of a log of your progress. Your caregiver can provide you with a simple table to help with this. If you are using the spirometer at home, follow these instructions: [place] IF:   You are having difficultly using the spirometer.  You have trouble using the spirometer as often as instructed.  Your pain medication is not giving enough relief while using the spirometer.  You develop fever of 100.5 F (38.1 C) or higher. SEEK IMMEDIATE MEDICAL CARE IF:   You cough up bloody sputum that had not been present before.  You develop fever of 102 F (38.9 C) or greater.  You develop worsening pain at or near the incision site. MAKE SURE YOU:   Understand these instructions.  Will watch your condition.  Will get help right away if you are not doing well or get worse. Document Released: 11/28/2006 Document Revised: 10/10/2011 Document Reviewed: 01/29/2007 [facility] Patient Information 2014 [place], [place].   ________________________________________________________________________

## 2014-09-12 ENCOUNTER — Encounter (HOSPITAL_COMMUNITY)
Admission: RE | Admit: 2014-09-12 | Discharge: 2014-09-12 | Disposition: A | Discharge status: Home / Self Care | Payer: Medicare Other | Source: Ambulatory Visit | Attending: Specialist | Admitting: Specialist

## 2014-09-12 ENCOUNTER — Encounter (HOSPITAL_COMMUNITY): Payer: Self-pay

## 2014-09-12 DIAGNOSIS — Z01810 Encounter for preprocedural cardiovascular examination: Secondary | ICD-10-CM | POA: Insufficient documentation

## 2014-09-12 DIAGNOSIS — M1712 Unilateral primary osteoarthritis, left knee: Secondary | ICD-10-CM | POA: Diagnosis not present

## 2014-09-12 DIAGNOSIS — Z01812 Encounter for preprocedural laboratory examination: Secondary | ICD-10-CM | POA: Diagnosis not present

## 2014-09-12 HISTORY — DX: Pneumonia, unspecified organism: J18.9

## 2014-09-12 LAB — BASIC METABOLIC PANEL
Anion gap: 7 (ref 5–15)
BUN: 19 mg/dL (ref 6–23)
CO2: 28 mmol/L (ref 19–32)
CREATININE: 1.22 mg/dL (ref 0.50–1.35)
Calcium: 9.7 mg/dL (ref 8.4–10.5)
Chloride: 104 mmol/L (ref 96–112)
GFR calc Af Amer: 62 mL/min — ABNORMAL LOW (ref >90)
GFR calc non Af Amer: 54 mL/min — ABNORMAL LOW (ref >90)
Glucose, Bld: 144 mg/dL — ABNORMAL HIGH (ref 70–99)
Potassium: 3.9 mmol/L (ref 3.5–5.1)
SODIUM: 139 mmol/L (ref 135–145)

## 2014-09-12 LAB — PROTIME-INR
INR: 1 (ref 0.00–1.49)
PROTHROMBIN TIME: 13.3 s (ref 11.6–15.2)

## 2014-09-12 LAB — CBC
HEMATOCRIT: 42.3 % (ref 39.0–52.0)
Hemoglobin: 14.3 g/dL (ref 13.0–17.0)
MCH: 31.5 pg (ref 26.0–34.0)
MCHC: 33.8 g/dL (ref 30.0–36.0)
MCV: 93.2 fL (ref 78.0–100.0)
Platelets: 288 10*3/uL (ref 150–400)
RBC: 4.54 MIL/uL (ref 4.22–5.81)
RDW: 13.3 % (ref 11.5–15.5)
WBC: 6.7 10*3/uL (ref 4.0–10.5)

## 2014-09-12 LAB — URINE MICROSCOPIC-ADD ON

## 2014-09-12 LAB — URINALYSIS, ROUTINE W REFLEX MICROSCOPIC
Bilirubin Urine: NEGATIVE
GLUCOSE, UA: NEGATIVE mg/dL
Hgb urine dipstick: NEGATIVE
KETONES UR: NEGATIVE mg/dL
Leukocytes, UA: NEGATIVE
Nitrite: NEGATIVE
PH: 7.5 (ref 5.0–8.0)
Protein, ur: NEGATIVE mg/dL
SPECIFIC GRAVITY, URINE: 1.019 (ref 1.005–1.030)
Urobilinogen, UA: 0.2 mg/dL (ref 0.0–1.0)

## 2014-09-12 LAB — APTT: aPTT: 30 seconds (ref 24–37)

## 2014-09-12 NOTE — Patient Instructions (Signed)
John Gamble  09/12/2014   Your procedure is scheduled on: 09/19/2014    Report to [facility] Main  Entrance and follow signs to               [place] at    1100 AM.  Call this number if you have problems the morning of surgery [phone]   Remember:  Do not eat food or drink liquids :After Midnight.     Take these medicines the morning of surgery with A SIP OF WATER: none                                You may not have any metal on your body including hair pins and              piercings  Do not wear jewelry,  lotions, powders or perfumes., deodorant                         Men may shave face and neck.   Do not bring valuables to the hospital. [name].  Contacts, dentures or bridgework may not be worn into surgery.  Leave suitcase in the car. After surgery it may be brought to your room.        Special Instructions:coughing and deep breathing exercises, leg exercises               Please read over the following fact sheets you were given: _____________________________________________________________________             [facility] - Preparing for Surgery Before surgery, you can play an important role.  Because skin is not sterile, your skin needs to be as free of germs as possible.  You can reduce the number of germs on your skin by washing with CHG (chlorahexidine gluconate) soap before surgery.  CHG is an antiseptic cleaner which kills germs and bonds with the skin to continue killing germs even after washing. Please DO NOT use if you have an allergy to CHG or antibacterial soaps.  If your skin becomes reddened/irritated stop using the CHG and inform your nurse when you arrive at Short Stay. Do not shave (including legs and underarms) for at least 48 hours prior to the first CHG shower.  You may shave your face/neck. Please follow these instructions carefully:  1.  Shower with CHG Soap the  night before surgery and the  morning of Surgery.  2.  If you choose to wash your hair, wash your hair first as usual with your  normal  shampoo.  3.  After you shampoo, rinse your hair and body thoroughly to remove the  shampoo.                           4.  Use CHG as you would any other liquid soap.  You can apply chg directly  to the skin and wash                       Gently with a scrungie or clean washcloth.  5.  Apply the CHG Soap to your body ONLY FROM THE NECK DOWN.  Do not use on face/ open                           Wound or open sores. Avoid contact with eyes, ears mouth and genitals (private parts).                       Wash face,  Genitals (private parts) with your normal soap.             6.  Wash thoroughly, paying special attention to the area where your surgery  will be performed.  7.  Thoroughly rinse your body with warm water from the neck down.  8.  DO NOT shower/wash with your normal soap after using and rinsing off  the CHG Soap.                9.  Pat yourself dry with a clean towel.            10.  Wear clean pajamas.            11.  Place clean sheets on your bed the night of your first shower and do not  sleep with pets. Day of Surgery : Do not apply any lotions/deodorants the morning of surgery.  Please wear clean clothes to the hospital/surgery center.  FAILURE TO FOLLOW THESE INSTRUCTIONS MAY RESULT IN THE CANCELLATION OF YOUR SURGERY PATIENT SIGNATURE_________________________________  NURSE SIGNATURE__________________________________  ________________________________________________________________________  WHAT IS A BLOOD TRANSFUSION? Blood Transfusion Information  A transfusion is the replacement of blood or some of its parts. Blood is made up of multiple cells which provide different functions.  Red blood cells carry oxygen and are used for blood loss replacement.  White blood cells fight against infection.  Platelets control bleeding.  Plasma  helps clot blood.  Other blood products are available for specialized needs, such as hemophilia or other clotting disorders. BEFORE THE TRANSFUSION  Who gives blood for transfusions?   Healthy volunteers who are fully evaluated to make sure their blood is safe. This is blood bank blood. Transfusion therapy is the safest it has ever been in the practice of medicine. Before blood is taken from a donor, a complete history is taken to make sure that person has no history of diseases nor engages in risky social behavior (examples are intravenous drug use or sexual activity with multiple partners). The donor's travel history is screened to minimize risk of transmitting infections, such as malaria. The donated blood is tested for signs of infectious diseases, such as HIV and hepatitis. The blood is then tested to be sure it is compatible with you in order to minimize the chance of a transfusion reaction. If you or a relative donates blood, this is often done in anticipation of surgery and is not appropriate for emergency situations. It takes many days to process the donated blood. RISKS AND COMPLICATIONS Although transfusion therapy is very safe and saves many lives, the main dangers of transfusion include:  1. Getting an infectious disease. 2. Developing a transfusion reaction. This is an allergic reaction to something in the blood you were given. Every precaution is taken to prevent this. The decision to have a blood transfusion has been considered carefully by your caregiver before blood is given. Blood is not given unless the benefits outweigh the risks. AFTER THE TRANSFUSION  Right after receiving a blood transfusion, you will usually feel much better and more energetic. This is especially  true if your red blood cells have gotten low (anemic). The transfusion raises the level of the red blood cells which carry oxygen, and this usually causes an energy increase.  The nurse administering the transfusion  will monitor you carefully for complications. HOME CARE INSTRUCTIONS  No special instructions are needed after a transfusion. You may find your energy is better. Speak with your caregiver about any limitations on activity for underlying diseases you may have. SEEK MEDICAL CARE IF:   Your condition is not improving after your transfusion.  You develop redness or irritation at the intravenous (IV) site. SEEK IMMEDIATE MEDICAL CARE IF:  Any of the following symptoms occur over the next 12 hours:  Shaking chills.  You have a temperature by mouth above 102 F (38.9 C), not controlled by medicine.  Chest, back, or muscle pain.  People around you feel you are not acting correctly or are confused.  Shortness of breath or difficulty breathing.  Dizziness and fainting.  You get a rash or develop hives.  You have a decrease in urine output.  Your urine turns a dark color or changes to pink, red, or brown. Any of the following symptoms occur over the next 10 days:  You have a temperature by mouth above 102 F (38.9 C), not controlled by medicine.  Shortness of breath.  Weakness after normal activity.  The white part of the eye turns yellow (jaundice).  You have a decrease in the amount of urine or are urinating less often.  Your urine turns a dark color or changes to pink, red, or brown. Document Released: 07/15/2000 Document Revised: 10/10/2011 Document Reviewed: 03/03/2008 ExitCare Patient Information 2014 [place].  _______________________________________________________________________  Incentive Spirometer  An incentive spirometer is a tool that can help keep your lungs clear and active. This tool measures how well you are filling your lungs with each breath. Taking long deep breaths may help reverse or decrease the chance of developing breathing (pulmonary) problems (especially infection) following:  A long period of time when you are unable to move or be  active. BEFORE THE PROCEDURE   If the spirometer includes an indicator to show your best effort, your nurse or respiratory therapist will set it to a desired goal.  If possible, sit up straight or lean slightly forward. Try not to slouch.  Hold the incentive spirometer in an upright position. INSTRUCTIONS FOR USE  3. Sit on the edge of your bed if possible, or sit up as far as you can in bed or on a chair. 4. Hold the incentive spirometer in an upright position. 5. Breathe out normally. 6. Place the mouthpiece in your mouth and seal your lips tightly around it. 7. Breathe in slowly and as deeply as possible, raising the piston or the ball toward the top of the column. 8. Hold your breath for 3-5 seconds or for as long as possible. Allow the piston or ball to fall to the bottom of the column. 9. Remove the mouthpiece from your mouth and breathe out normally. 10. Rest for a few seconds and repeat Steps 1 through 7 at least 10 times every 1-2 hours when you are awake. Take your time and take a few normal breaths between deep breaths. 11. The spirometer may include an indicator to show your best effort. Use the indicator as a goal to work toward during each repetition. 12. After each set of 10 deep breaths, practice coughing to be sure your lungs are clear. If you have  an incision (the cut made at the time of surgery), support your incision when coughing by placing a pillow or rolled up towels firmly against it. Once you are able to get out of bed, walk around indoors and cough well. You may stop using the incentive spirometer when instructed by your caregiver.  RISKS AND COMPLICATIONS  Take your time so you do not get dizzy or light-headed.  If you are in pain, you may need to take or ask for pain medication before doing incentive spirometry. It is harder to take a deep breath if you are having pain. AFTER USE  Rest and breathe slowly and easily.  It can be helpful to keep track of a log of  your progress. Your caregiver can provide you with a simple table to help with this. If you are using the spirometer at home, follow these instructions: [place] IF:   You are having difficultly using the spirometer.  You have trouble using the spirometer as often as instructed.  Your pain medication is not giving enough relief while using the spirometer.  You develop fever of 100.5 F (38.1 C) or higher. SEEK IMMEDIATE MEDICAL CARE IF:   You cough up bloody sputum that had not been present before.  You develop fever of 102 F (38.9 C) or greater.  You develop worsening pain at or near the incision site. MAKE SURE YOU:   Understand these instructions.  Will watch your condition.  Will get help right away if you are not doing well or get worse. Document Released: 11/28/2006 Document Revised: 10/10/2011 Document Reviewed: 01/29/2007 [facility] Patient Information 2014 [place], [place].   ________________________________________________________________________

## 2014-09-12 NOTE — Progress Notes (Signed)
12/25/2013 CXR- EPIC  DR p [name]- 04/02/2014 LOV EPIC  07/11/14- Clearance- LOV with Dr Garlon Hatchet- 07/11/14 on chart

## 2014-09-13 LAB — SURGICAL PCR SCREEN
MRSA, PCR: NEGATIVE
Staphylococcus aureus: POSITIVE — AB

## 2014-09-15 ENCOUNTER — Telehealth: Payer: Self-pay | Admitting: Cardiology

## 2014-09-15 ENCOUNTER — Inpatient Hospital Stay (HOSPITAL_COMMUNITY)
Admission: RE | Admit: 2014-09-15 | Discharge: 2014-09-15 | Disposition: A | Discharge status: Home / Self Care | Payer: Medicare Other | Source: Ambulatory Visit

## 2014-09-15 NOTE — Telephone Encounter (Signed)
Pt's wife called in stating that he will be having knee surgery on 2/19 with Dr. Theda Sers and she would like to know if there are any meds that he needs to stop taking. Please f/u with pt

## 2014-09-15 NOTE — Telephone Encounter (Signed)
Spoke to wife. Informed wife no medications change needed prior to surgery per signed clearance by Dr [name]. Wife verbalized understanding.

## 2014-09-15 NOTE — Progress Notes (Signed)
Final EKG done 09/12/2014 in [facility].

## 2014-09-19 ENCOUNTER — Encounter (HOSPITAL_COMMUNITY): Payer: Self-pay | Admitting: *Deleted

## 2014-09-19 ENCOUNTER — Inpatient Hospital Stay (HOSPITAL_COMMUNITY)
Admission: RE | Admit: 2014-09-19 | Discharge: 2014-09-22 | DRG: 470 | Disposition: A | Discharge status: Skilled Nursing Facility | Payer: Medicare Other | Source: Ambulatory Visit | Attending: Specialist | Admitting: Specialist

## 2014-09-19 ENCOUNTER — Inpatient Hospital Stay (HOSPITAL_COMMUNITY): Payer: Medicare Other | Admitting: Anesthesiology

## 2014-09-19 ENCOUNTER — Encounter (HOSPITAL_COMMUNITY)
Admission: RE | Disposition: A | Discharge status: Skilled Nursing Facility | Payer: Self-pay | Source: Ambulatory Visit | Attending: Specialist

## 2014-09-19 DIAGNOSIS — I10 Essential (primary) hypertension: Secondary | ICD-10-CM | POA: Diagnosis present

## 2014-09-19 DIAGNOSIS — M1710 Unilateral primary osteoarthritis, unspecified knee: Secondary | ICD-10-CM

## 2014-09-19 DIAGNOSIS — Z96652 Presence of left artificial knee joint: Secondary | ICD-10-CM | POA: Diagnosis not present

## 2014-09-19 DIAGNOSIS — Z87891 Personal history of nicotine dependence: Secondary | ICD-10-CM

## 2014-09-19 DIAGNOSIS — M179 Osteoarthritis of knee, unspecified: Secondary | ICD-10-CM | POA: Diagnosis not present

## 2014-09-19 DIAGNOSIS — I252 Old myocardial infarction: Secondary | ICD-10-CM

## 2014-09-19 DIAGNOSIS — K5900 Constipation, unspecified: Secondary | ICD-10-CM | POA: Diagnosis not present

## 2014-09-19 DIAGNOSIS — I2510 Atherosclerotic heart disease of native coronary artery without angina pectoris: Secondary | ICD-10-CM | POA: Diagnosis not present

## 2014-09-19 DIAGNOSIS — Z01812 Encounter for preprocedural laboratory examination: Secondary | ICD-10-CM

## 2014-09-19 DIAGNOSIS — M1712 Unilateral primary osteoarthritis, left knee: Principal | ICD-10-CM | POA: Diagnosis present

## 2014-09-19 DIAGNOSIS — E785 Hyperlipidemia, unspecified: Secondary | ICD-10-CM | POA: Diagnosis not present

## 2014-09-19 DIAGNOSIS — Z955 Presence of coronary angioplasty implant and graft: Secondary | ICD-10-CM

## 2014-09-19 DIAGNOSIS — M25562 Pain in left knee: Secondary | ICD-10-CM | POA: Diagnosis present

## 2014-09-19 DIAGNOSIS — Z471 Aftercare following joint replacement surgery: Secondary | ICD-10-CM | POA: Diagnosis not present

## 2014-09-19 DIAGNOSIS — Z96659 Presence of unspecified artificial knee joint: Secondary | ICD-10-CM

## 2014-09-19 HISTORY — PX: TOTAL KNEE ARTHROPLASTY: SHX125

## 2014-09-19 LAB — TYPE AND SCREEN
ABO/RH(D): A POS
Antibody Screen: NEGATIVE

## 2014-09-19 SURGERY — ARTHROPLASTY, KNEE, TOTAL
Anesthesia: Spinal | Site: Knee | Laterality: Left

## 2014-09-19 MED ORDER — QUINAPRIL HCL 10 MG PO TABS
40.0000 mg | ORAL_TABLET | Freq: Every day | ORAL | Status: DC
Start: 2014-09-19 — End: 2014-09-19
  Filled 2014-09-19: qty 4

## 2014-09-19 MED ORDER — PROPOFOL 10 MG/ML IV BOLUS
INTRAVENOUS | Status: AC
  Filled 2014-09-19: qty 20

## 2014-09-19 MED ORDER — MEPERIDINE HCL 50 MG/ML IJ SOLN: 6.2500 mg | INTRAMUSCULAR | Status: DC | PRN

## 2014-09-19 MED ORDER — POLYETHYLENE GLYCOL 3350 17 G PO PACK
17.0000 g | PACK | Freq: Every day | ORAL | Status: DC | PRN
  Administered 2014-09-20 – 2014-09-22 (×3): 17 g via ORAL
  Filled 2014-09-19 (×3): qty 1

## 2014-09-19 MED ORDER — HYDROCHLOROTHIAZIDE 25 MG PO TABS
12.5000 mg | ORAL_TABLET | Freq: Every day | ORAL | Status: DC
  Administered 2014-09-20: 12.5 mg via ORAL
  Filled 2014-09-19 (×2): qty 0.5

## 2014-09-19 MED ORDER — ONDANSETRON HCL 4 MG/2ML IJ SOLN
4.0000 mg | Freq: Four times a day (QID) | INTRAMUSCULAR | Status: DC | PRN
  Administered 2014-09-20: 4 mg via INTRAVENOUS
  Filled 2014-09-19: qty 2

## 2014-09-19 MED ORDER — KETOROLAC TROMETHAMINE 30 MG/ML IJ SOLN
INTRAMUSCULAR | Status: DC | PRN
  Administered 2014-09-19: 30 mg

## 2014-09-19 MED ORDER — VANCOMYCIN HCL IN DEXTROSE 1-5 GM/200ML-% IV SOLN
INTRAVENOUS | Status: AC
  Filled 2014-09-19: qty 200

## 2014-09-19 MED ORDER — PHENYLEPHRINE HCL 10 MG/ML IJ SOLN
INTRAMUSCULAR | Status: AC
  Filled 2014-09-19: qty 1

## 2014-09-19 MED ORDER — ACYCLOVIR 800 MG PO TABS
800.0000 mg | ORAL_TABLET | Freq: Every day | ORAL | Status: DC
  Administered 2014-09-20 – 2014-09-22 (×3): 800 mg via ORAL
  Filled 2014-09-19 (×3): qty 1

## 2014-09-19 MED ORDER — PREDNISOLONE ACETATE 1 % OP SUSP
1.0000 [drp] | Freq: Every day | OPHTHALMIC | Status: DC
  Administered 2014-09-19 – 2014-09-21 (×3): 1 [drp] via OPHTHALMIC
  Filled 2014-09-19 (×2): qty 1

## 2014-09-19 MED ORDER — BUPIVACAINE-EPINEPHRINE (PF) 0.25% -1:200000 IJ SOLN
INTRAMUSCULAR | Status: AC
  Filled 2014-09-19: qty 30

## 2014-09-19 MED ORDER — SODIUM CHLORIDE 0.9 % IR SOLN
Status: DC | PRN
  Administered 2014-09-19: 1000 mL

## 2014-09-19 MED ORDER — LACTATED RINGERS IV SOLN
INTRAVENOUS | Status: DC | PRN
  Administered 2014-09-19 (×3): via INTRAVENOUS

## 2014-09-19 MED ORDER — ACETAMINOPHEN 650 MG RE SUPP: 650.0000 mg | Freq: Four times a day (QID) | RECTAL | Status: DC | PRN

## 2014-09-19 MED ORDER — BUPIVACAINE-EPINEPHRINE 0.25% -1:200000 IJ SOLN
INTRAMUSCULAR | Status: DC | PRN
  Administered 2014-09-19: 30 mL

## 2014-09-19 MED ORDER — MIDAZOLAM HCL 5 MG/5ML IJ SOLN
INTRAMUSCULAR | Status: DC | PRN
  Administered 2014-09-19: 0.5 mg via INTRAVENOUS

## 2014-09-19 MED ORDER — ONDANSETRON HCL 4 MG PO TABS: 4.0000 mg | ORAL_TABLET | Freq: Four times a day (QID) | ORAL | Status: DC | PRN

## 2014-09-19 MED ORDER — PHENYLEPHRINE HCL 10 MG/ML IJ SOLN
10.0000 mg | INTRAVENOUS | Status: DC | PRN
  Administered 2014-09-19: 10 ug/min via INTRAVENOUS

## 2014-09-19 MED ORDER — FERROUS SULFATE 325 (65 FE) MG PO TABS
325.0000 mg | ORAL_TABLET | Freq: Three times a day (TID) | ORAL | Status: DC
Start: 2014-09-19 — End: 2014-09-22
  Administered 2014-09-20 – 2014-09-22 (×4): 325 mg via ORAL
  Filled 2014-09-19 (×11): qty 1

## 2014-09-19 MED ORDER — MAGNESIUM CITRATE PO SOLN: 1.0000 | Freq: Once | ORAL | Status: AC | PRN

## 2014-09-19 MED ORDER — ACETAMINOPHEN 325 MG PO TABS
650.0000 mg | ORAL_TABLET | Freq: Four times a day (QID) | ORAL | Status: DC | PRN
  Administered 2014-09-21 – 2014-09-22 (×5): 650 mg via ORAL
  Filled 2014-09-19 (×5): qty 2

## 2014-09-19 MED ORDER — CEFAZOLIN SODIUM-DEXTROSE 2-3 GM-% IV SOLR
INTRAVENOUS | Status: AC
  Filled 2014-09-19: qty 50

## 2014-09-19 MED ORDER — DOCUSATE SODIUM 100 MG PO CAPS
100.0000 mg | ORAL_CAPSULE | Freq: Two times a day (BID) | ORAL | Status: DC
  Administered 2014-09-19 – 2014-09-22 (×6): 100 mg via ORAL

## 2014-09-19 MED ORDER — PROMETHAZINE HCL 25 MG/ML IJ SOLN: 6.2500 mg | INTRAMUSCULAR | Status: DC | PRN

## 2014-09-19 MED ORDER — POVIDONE-IODINE 7.5 % EX SOLN: Freq: Once | CUTANEOUS | Status: DC

## 2014-09-19 MED ORDER — HYDROMORPHONE HCL 1 MG/ML IJ SOLN
0.5000 mg | INTRAMUSCULAR | Status: DC | PRN
  Administered 2014-09-19 – 2014-09-20 (×2): 0.5 mg via INTRAVENOUS
  Administered 2014-09-20: 1 mg via INTRAVENOUS
  Filled 2014-09-19 (×3): qty 1

## 2014-09-19 MED ORDER — MENTHOL 3 MG MT LOZG: 1.0000 | LOZENGE | OROMUCOSAL | Status: DC | PRN

## 2014-09-19 MED ORDER — SODIUM CHLORIDE 0.9 % IJ SOLN
INTRAMUSCULAR | Status: DC | PRN
  Administered 2014-09-19: 30 mL

## 2014-09-19 MED ORDER — PHENOL 1.4 % MT LIQD: 1.0000 | OROMUCOSAL | Status: DC | PRN

## 2014-09-19 MED ORDER — ACETAMINOPHEN 10 MG/ML IV SOLN: 1000.0000 mg | Freq: Once | INTRAVENOUS | Status: DC

## 2014-09-19 MED ORDER — ENOXAPARIN SODIUM 30 MG/0.3ML ~~LOC~~ SOLN
30.0000 mg | Freq: Two times a day (BID) | SUBCUTANEOUS | Status: DC
  Administered 2014-09-20 – 2014-09-22 (×5): 30 mg via SUBCUTANEOUS
  Filled 2014-09-19 (×7): qty 0.3

## 2014-09-19 MED ORDER — POTASSIUM CHLORIDE IN NACL 20-0.9 MEQ/L-% IV SOLN
INTRAVENOUS | Status: DC
  Administered 2014-09-19 – 2014-09-20 (×2): via INTRAVENOUS
  Filled 2014-09-19 (×3): qty 1000

## 2014-09-19 MED ORDER — LACTATED RINGERS IV SOLN
INTRAVENOUS | Status: DC
  Administered 2014-09-19: 1000 mL via INTRAVENOUS

## 2014-09-19 MED ORDER — SODIUM CHLORIDE 0.9 % IJ SOLN
INTRAMUSCULAR | Status: AC
  Filled 2014-09-19: qty 50

## 2014-09-19 MED ORDER — FENTANYL CITRATE 0.05 MG/ML IJ SOLN
INTRAMUSCULAR | Status: DC | PRN
  Administered 2014-09-19: 100 ug via INTRAVENOUS

## 2014-09-19 MED ORDER — METHOCARBAMOL 500 MG PO TABS
500.0000 mg | ORAL_TABLET | Freq: Four times a day (QID) | ORAL | Status: DC | PRN
  Administered 2014-09-20 – 2014-09-21 (×3): 500 mg via ORAL
  Filled 2014-09-19 (×3): qty 1

## 2014-09-19 MED ORDER — METOCLOPRAMIDE HCL 5 MG/ML IJ SOLN
5.0000 mg | Freq: Three times a day (TID) | INTRAMUSCULAR | Status: DC | PRN
  Administered 2014-09-20: 10 mg via INTRAVENOUS
  Filled 2014-09-19: qty 2

## 2014-09-19 MED ORDER — METHOCARBAMOL 1000 MG/10ML IJ SOLN
500.0000 mg | Freq: Four times a day (QID) | INTRAVENOUS | Status: DC | PRN
  Administered 2014-09-19 – 2014-09-20 (×2): 500 mg via INTRAVENOUS
  Filled 2014-09-19 (×5): qty 5

## 2014-09-19 MED ORDER — DEXAMETHASONE SODIUM PHOSPHATE 10 MG/ML IJ SOLN: 10.0000 mg | Freq: Once | INTRAMUSCULAR | Status: DC

## 2014-09-19 MED ORDER — FENTANYL CITRATE 0.05 MG/ML IJ SOLN
INTRAMUSCULAR | Status: AC
  Filled 2014-09-19: qty 2

## 2014-09-19 MED ORDER — KETOROLAC TROMETHAMINE 30 MG/ML IJ SOLN
INTRAMUSCULAR | Status: AC
  Filled 2014-09-19: qty 1

## 2014-09-19 MED ORDER — FENTANYL CITRATE 0.05 MG/ML IJ SOLN: 25.0000 ug | INTRAMUSCULAR | Status: DC | PRN

## 2014-09-19 MED ORDER — SODIUM CHLORIDE 0.9 % IV SOLN: INTRAVENOUS | Status: DC

## 2014-09-19 MED ORDER — DEXAMETHASONE SODIUM PHOSPHATE 10 MG/ML IJ SOLN
10.0000 mg | Freq: Once | INTRAMUSCULAR | Status: AC
  Administered 2014-09-20: 10 mg via INTRAVENOUS
  Filled 2014-09-19: qty 1

## 2014-09-19 MED ORDER — CEFAZOLIN SODIUM-DEXTROSE 2-3 GM-% IV SOLR
2.0000 g | Freq: Four times a day (QID) | INTRAVENOUS | Status: AC
  Administered 2014-09-20: 2 g via INTRAVENOUS
  Filled 2014-09-19 (×2): qty 50

## 2014-09-19 MED ORDER — FENOFIBRATE 160 MG PO TABS
160.0000 mg | ORAL_TABLET | Freq: Every day | ORAL | Status: DC
  Administered 2014-09-20 – 2014-09-22 (×3): 160 mg via ORAL
  Filled 2014-09-19 (×3): qty 1

## 2014-09-19 MED ORDER — ALUM & MAG HYDROXIDE-SIMETH 200-200-20 MG/5ML PO SUSP: 30.0000 mL | ORAL | Status: DC | PRN

## 2014-09-19 MED ORDER — CEFAZOLIN SODIUM-DEXTROSE 2-3 GM-% IV SOLR: 2.0000 g | INTRAVENOUS | Status: DC

## 2014-09-19 MED ORDER — ACETAMINOPHEN 10 MG/ML IV SOLN
1000.0000 mg | Freq: Once | INTRAVENOUS | Status: AC
  Administered 2014-09-19: 1000 mg via INTRAVENOUS
  Filled 2014-09-19: qty 100

## 2014-09-19 MED ORDER — METOCLOPRAMIDE HCL 5 MG PO TABS
5.0000 mg | ORAL_TABLET | Freq: Three times a day (TID) | ORAL | Status: DC | PRN
  Filled 2014-09-19: qty 2

## 2014-09-19 MED ORDER — PROPOFOL 10 MG/ML IV BOLUS
INTRAVENOUS | Status: DC | PRN
  Administered 2014-09-19: 20 mg via INTRAVENOUS

## 2014-09-19 MED ORDER — PHENYLEPHRINE 40 MCG/ML (10ML) SYRINGE FOR IV PUSH (FOR BLOOD PRESSURE SUPPORT)
PREFILLED_SYRINGE | INTRAVENOUS | Status: AC
  Filled 2014-09-19: qty 10

## 2014-09-19 MED ORDER — OXYCODONE HCL 5 MG PO TABS
5.0000 mg | ORAL_TABLET | ORAL | Status: DC | PRN
  Administered 2014-09-19: 10 mg via ORAL
  Administered 2014-09-20: 5 mg via ORAL
  Administered 2014-09-20 – 2014-09-22 (×10): 10 mg via ORAL
  Filled 2014-09-19 (×12): qty 2

## 2014-09-19 MED ORDER — MIDAZOLAM HCL 2 MG/2ML IJ SOLN
INTRAMUSCULAR | Status: AC
  Filled 2014-09-19: qty 2

## 2014-09-19 MED ORDER — VANCOMYCIN HCL IN DEXTROSE 1-5 GM/200ML-% IV SOLN: 1000.0000 mg | INTRAVENOUS | Status: DC

## 2014-09-19 MED ORDER — PRAVASTATIN SODIUM 20 MG PO TABS
20.0000 mg | ORAL_TABLET | Freq: Every day | ORAL | Status: DC
  Administered 2014-09-20 – 2014-09-21 (×2): 20 mg via ORAL
  Filled 2014-09-19 (×4): qty 1

## 2014-09-19 MED ORDER — BISACODYL 5 MG PO TBEC: 5.0000 mg | DELAYED_RELEASE_TABLET | Freq: Every day | ORAL | Status: DC | PRN

## 2014-09-19 MED ORDER — CEFAZOLIN SODIUM-DEXTROSE 2-3 GM-% IV SOLR
INTRAVENOUS | Status: DC | PRN
  Administered 2014-09-19: 2 g via INTRAVENOUS

## 2014-09-19 MED ORDER — PHENYLEPHRINE HCL 10 MG/ML IJ SOLN
INTRAMUSCULAR | Status: DC | PRN
  Administered 2014-09-19 (×2): 80 ug via INTRAVENOUS

## 2014-09-19 MED ORDER — PROPOFOL INFUSION 10 MG/ML OPTIME
INTRAVENOUS | Status: DC | PRN
  Administered 2014-09-19: 100 ug/kg/min via INTRAVENOUS

## 2014-09-19 MED ORDER — LISINOPRIL 40 MG PO TABS
40.0000 mg | ORAL_TABLET | Freq: Every day | ORAL | Status: DC
  Administered 2014-09-20 – 2014-09-21 (×2): 40 mg via ORAL
  Filled 2014-09-19 (×5): qty 1

## 2014-09-19 MED ORDER — DIPHENHYDRAMINE HCL 12.5 MG/5ML PO ELIX
12.5000 mg | ORAL_SOLUTION | ORAL | Status: DC | PRN
Start: 2014-09-19 — End: 2014-09-22

## 2014-09-19 SURGICAL SUPPLY — 63 items
BAG ZIPLOCK 12X15 (MISCELLANEOUS) ×6 IMPLANT
BANDAGE ELASTIC 4 VELCRO ST LF (GAUZE/BANDAGES/DRESSINGS) ×3 IMPLANT
BANDAGE ELASTIC 6 VELCRO ST LF (GAUZE/BANDAGES/DRESSINGS) ×3 IMPLANT
BANDAGE ESMARK 6X9 LF (GAUZE/BANDAGES/DRESSINGS) ×1 IMPLANT
BLADE SAG 18X100X1.27 (BLADE) ×3 IMPLANT
BLADE SAW SGTL 13.0X1.19X90.0M (BLADE) ×3 IMPLANT
BNDG ESMARK 6X9 LF (GAUZE/BANDAGES/DRESSINGS) ×3
CAP KNEE TOTAL 3 SIGMA ×3 IMPLANT
CEMENT HV SMART SET (Cement) ×6 IMPLANT
CUFF TOURN SGL QUICK 34 (TOURNIQUET CUFF) ×2
CUFF TRNQT CYL 34X4X40X1 (TOURNIQUET CUFF) ×1 IMPLANT
DRAPE EXTREMITY T 121X128X90 (DRAPE) ×3 IMPLANT
DRAPE POUCH INSTRU U-SHP 10X18 (DRAPES) ×3 IMPLANT
DRAPE SHEET LG 3/4 BI-LAMINATE (DRAPES) ×3 IMPLANT
DRAPE U-SHAPE 47X51 STRL (DRAPES) ×3 IMPLANT
DRSG AQUACEL AG ADV 3.5X10 (GAUZE/BANDAGES/DRESSINGS) ×3 IMPLANT
DRSG TEGADERM 4X4.75 (GAUZE/BANDAGES/DRESSINGS) ×3 IMPLANT
DURAPREP 26ML APPLICATOR (WOUND CARE) ×3 IMPLANT
ELECT REM PT RETURN 9FT ADLT (ELECTROSURGICAL) ×3
ELECTRODE REM PT RTRN 9FT ADLT (ELECTROSURGICAL) ×1 IMPLANT
EVACUATOR 1/8 PVC DRAIN (DRAIN) ×3 IMPLANT
FACESHIELD WRAPAROUND (MASK) ×15 IMPLANT
GAUZE SPONGE 2X2 8PLY STRL LF (GAUZE/BANDAGES/DRESSINGS) ×1 IMPLANT
GLOVE BIOGEL PI IND STRL 8 (GLOVE) ×2 IMPLANT
GLOVE BIOGEL PI INDICATOR 8 (GLOVE) ×4
GLOVE SURG ORTHO 8.0 STRL STRW (GLOVE) ×3 IMPLANT
GLOVE SURG ORTHO 9.0 STRL STRW (GLOVE) ×3 IMPLANT
GLOVE SURG SS PI 7.5 STRL IVOR (GLOVE) ×3 IMPLANT
GOWN STRL REUS W/TWL XL LVL3 (GOWN DISPOSABLE) ×6 IMPLANT
HANDPIECE INTERPULSE COAX TIP (DISPOSABLE) ×2
IMMOBILIZER KNEE 20 (SOFTGOODS) ×3 IMPLANT
IMMOBILIZER KNEE 20 THIGH 36 (SOFTGOODS) IMPLANT
KIT BASIN OR (CUSTOM PROCEDURE TRAY) ×3 IMPLANT
LIQUID BAND (GAUZE/BANDAGES/DRESSINGS) ×3 IMPLANT
NDL SAFETY ECLIPSE 18X1.5 (NEEDLE) ×1 IMPLANT
NEEDLE HYPO 18GX1.5 SHARP (NEEDLE) ×2
NS IRRIG 1000ML POUR BTL (IV SOLUTION) ×3 IMPLANT
PACK TOTAL JOINT (CUSTOM PROCEDURE TRAY) ×3 IMPLANT
POSITIONER SURGICAL ARM (MISCELLANEOUS) ×3 IMPLANT
SET HNDPC FAN SPRY TIP SCT (DISPOSABLE) ×1 IMPLANT
SET PAD KNEE POSITIONER (MISCELLANEOUS) IMPLANT
SPONGE GAUZE 2X2 STER 10/PKG (GAUZE/BANDAGES/DRESSINGS) ×2
SPONGE LAP 18X18 X RAY DECT (DISPOSABLE) IMPLANT
SPONGE SURGIFOAM ABS GEL 100 (HEMOSTASIS) ×3 IMPLANT
STOCKINETTE 6  STRL (DRAPES) ×2
STOCKINETTE 6 STRL (DRAPES) ×1 IMPLANT
SUCTION FRAZIER 12FR DISP (SUCTIONS) ×3 IMPLANT
SUT BONE WAX W31G (SUTURE) IMPLANT
SUT MNCRL AB 3-0 PS2 18 (SUTURE) ×3 IMPLANT
SUT VIC AB 1 CT1 27 (SUTURE) ×8
SUT VIC AB 1 CT1 27XBRD ANTBC (SUTURE) ×4 IMPLANT
SUT VIC AB 2-0 CT1 27 (SUTURE) ×4
SUT VIC AB 2-0 CT1 TAPERPNT 27 (SUTURE) ×2 IMPLANT
SUT VLOC 180 0 24IN GS25 (SUTURE) ×3 IMPLANT
SYR 50ML LL SCALE MARK (SYRINGE) ×3 IMPLANT
TAPE STRIPS DRAPE STRL (GAUZE/BANDAGES/DRESSINGS) ×3 IMPLANT
TOWEL OR 17X26 10 PK STRL BLUE (TOWEL DISPOSABLE) ×3 IMPLANT
TOWEL OR NON WOVEN STRL DISP B (DISPOSABLE) ×3 IMPLANT
TOWER CARTRIDGE SMART MIX (DISPOSABLE) ×3 IMPLANT
TRAY FOLEY CATH 14FRSI W/METER (CATHETERS) IMPLANT
TRAY FOLEY CATH 16FRSI W/METER (SET/KITS/TRAYS/PACK) ×3 IMPLANT
WATER STERILE IRR 1500ML POUR (IV SOLUTION) ×6 IMPLANT
WRAP KNEE MAXI GEL POST OP (GAUZE/BANDAGES/DRESSINGS) ×3 IMPLANT

## 2014-09-19 NOTE — Op Note (Signed)
DATE OF SURGERY:  09/19/2014  TIME: 3:16 PM  PATIENT NAME:  John Gamble    AGE: 81 y.o.   PRE-OPERATIVE DIAGNOSIS:  LEFT KNEE OA   POST-OPERATIVE DIAGNOSIS:  LEFT KNEE OA   PROCEDURE:  Procedure(s): LEFT TOTAL KNEE ARTHROPLASTY  SURGEON:  Ammar Moffatt ANDREW  ASSISTANT:  Bryson Stilwell, PA-C, present and scrubbed throughout the case, critical for assistance with exposure, retraction, instrumentation, and closure.  OPERATIVE IMPLANTS: Depuy PFC Sigma Rotating Platform.  Femur size 5, Tibia size 5, Patella size 38-peg oval button, with a 56m polyethylene insert.   PREOPERATIVE INDICATIONS:   John Gamble is a 81 y.o. year old male with end stage bone on bone arthritis of the knee who failed conservative treatment and elected for Total Knee Arthroplasty.   The risks, benefits, and alternatives were discussed at length including but not limited to the risks of infection, bleeding, nerve injury, stiffness, blood clots, the need for revision surgery, cardiopulmonary complications, among others, and they were willing to proceed.  OPERATIVE DESCRIPTION:  The patient was brought to the operative room and placed in a supine position.  Spinal anesthesia was administered.  IV antibiotics were given.  The lower extremity was prepped and draped in the usual sterile fashion.  Time out was performed.  The leg was elevated and exsanguinated and the tourniquet was inflated.  Anterior quadriceps tendon splitting approach was performed.  The patella was retracted and osteophytes were removed.  The anterior horn of the medial and lateral meniscus was removed and cruciate ligaments resected.   The distal femur was opened with the drill and the intramedullary distal femoral cutting jig was utilized, set at 5 degrees resecting 10 mm off the distal femur.  Care was taken to protect the collateral ligaments.  The distal femoral sizing jig was applied, taking care to avoid notching.  Then the 4-in-1  cutting jig was applied and the anterior and posterior femur was cut, along with the chamfer cuts.    Then the extramedullary tibial cutting jig was utilized making the appropriate cut using the anterior tibial crest as a reference building in appropriate posterior slope.  Care was taken during the cut to protect the medial and collateral ligaments.  The proximal tibia was removed along with the posterior horns of the menisci.   The posterior medial femoral osteophytes and posterior lateral femoral osteophytes were removed.    The flexion gap was then measured and was symmetric with the extension gap, measured at 10.  I completed the distal femoral preparation using the appropriate jig to prepare the box.  The patella was then measured, and cut with the saw.    The proximal tibia sized and prepared accordingly with the reamer and the punch, and then all components were trialed with the trial insert.  The knee was found to have excellent balance and full motion.    The above named components were then cemented into place and all excess cement was removed.  The trial polyethylene component was in place during cementation, and then was exchanged for the real polyethylene component.    The knee was easily taken through a range of motion and the patella tracked well and the knee irrigated copiously and the parapatellar and subcutaneous tissue closed with vicryl, and monocryl with steri strips for the skin.  The arthrotomy was closed at 90 of flexion. The wounds were dressed with sterile gauze and the tourniquet released and the patient was awakened and returned to the PACU  in stable and satisfactory condition.  There were no complications.  Total tourniquet time was 80inutes.

## 2014-09-19 NOTE — Interval H&P Note (Signed)
History and Physical Interval Note:  09/19/2014 1:14 PM  John Gamble  has presented today for surgery, with the diagnosis of LEFT KNEE OA   The various methods of treatment have been discussed with the patient and family. After consideration of risks, benefits and other options for treatment, the patient has consented to  Procedure(s): LEFT TOTAL KNEE ARTHROPLASTY (Left) as a surgical intervention .  The patient's history has been reviewed, patient examined, no change in status, stable for surgery.  I have reviewed the patient's chart and labs.  Questions were answered to the patient's satisfaction.     [name] ANDREW

## 2014-09-19 NOTE — Transfer of Care (Signed)
Immediate Anesthesia Transfer of Care Note  Patient: John Gamble  Procedure(s) Performed: Procedure(s): LEFT TOTAL KNEE ARTHROPLASTY (Left)  Patient Location: PACU  Anesthesia Type:Spinal  Level of Consciousness: awake, alert , oriented and patient cooperative  Airway & Oxygen Therapy: Patient Spontanous Breathing and Patient connected to face mask oxygen  Post-op Assessment: Report given to RN and Post -op Vital signs reviewed and stable  Post vital signs: stable  Last Vitals:  Filed Vitals:   09/19/14 1049  BP: 146/80  Pulse: 81  Temp: 36.3 C  Resp: 20    Complications: No apparent anesthesia complications

## 2014-09-19 NOTE — Anesthesia Postprocedure Evaluation (Signed)
  Anesthesia Post-op Note  Patient: John Gamble  Procedure(s) Performed: Procedure(s) (LRB): LEFT TOTAL KNEE ARTHROPLASTY (Left)  Patient Location: PACU  Anesthesia Type: General  Level of Consciousness: awake and alert   Airway and Oxygen Therapy: Patient Spontanous Breathing  Post-op Pain: mild  Post-op Assessment: Post-op Vital signs reviewed, Patient's Cardiovascular Status Stable, Respiratory Function Stable, Patent Airway and No signs of Nausea or vomiting  Last Vitals:  Filed Vitals:   09/19/14 1737  BP: 162/71  Pulse: 54  Temp: 36.3 C  Resp: 16    Post-op Vital Signs: stable   Complications: No apparent anesthesia complications

## 2014-09-19 NOTE — Anesthesia Preprocedure Evaluation (Addendum)
Anesthesia Evaluation  Patient identified by MRN, date of birth, ID band Patient awake    Reviewed: Allergy & Precautions, NPO status , Patient's Chart, lab work & pertinent test results  Airway Mallampati: II  TM Distance: >3 FB Neck ROM: Full    Dental no notable dental hx. (+) Chipped, Caps   Pulmonary neg pulmonary ROS, former smoker,  breath sounds clear to auscultation  Pulmonary exam normal       Cardiovascular Exercise Tolerance: Good hypertension, + CAD, + Past MI and + Cardiac Stents (2000) Rhythm:Regular Rate:Normal     Neuro/Psych negative neurological ROS  negative psych ROS   GI/Hepatic negative GI ROS, Neg liver ROS,   Endo/Other  negative endocrine ROS  Renal/GU negative Renal ROS  negative genitourinary   Musculoskeletal negative musculoskeletal ROS (+)   Abdominal   Peds negative pediatric ROS (+)  Hematology negative hematology ROS (+)   Anesthesia Other Findings   Reproductive/Obstetrics negative OB ROS                            Anesthesia Physical Anesthesia Plan  ASA: III  Anesthesia Plan: Spinal   Post-op Pain Management:    Induction:   Airway Management Planned: Simple Face Mask  Additional Equipment:   Intra-op Plan:   Post-operative Plan:   Informed Consent: I have reviewed the patients History and Physical, chart, labs and discussed the procedure including the risks, benefits and alternatives for the proposed anesthesia with the patient or authorized representative who has indicated his/her understanding and acceptance.   Dental advisory given  Plan Discussed with: CRNA  Anesthesia Plan Comments:         Anesthesia Quick Evaluation

## 2014-09-20 LAB — CBC
HCT: 36 % — ABNORMAL LOW (ref 39.0–52.0)
HEMOGLOBIN: 12.1 g/dL — AB (ref 13.0–17.0)
MCH: 31.3 pg (ref 26.0–34.0)
MCHC: 33.6 g/dL (ref 30.0–36.0)
MCV: 93.3 fL (ref 78.0–100.0)
Platelets: 218 10*3/uL (ref 150–400)
RBC: 3.86 MIL/uL — ABNORMAL LOW (ref 4.22–5.81)
RDW: 13.1 % (ref 11.5–15.5)
WBC: 10.2 10*3/uL (ref 4.0–10.5)

## 2014-09-20 LAB — BASIC METABOLIC PANEL
Anion gap: 4 — ABNORMAL LOW (ref 5–15)
BUN: 17 mg/dL (ref 6–23)
CALCIUM: 8.6 mg/dL (ref 8.4–10.5)
CO2: 28 mmol/L (ref 19–32)
Chloride: 102 mmol/L (ref 96–112)
Creatinine, Ser: 1 mg/dL (ref 0.50–1.35)
GFR calc non Af Amer: 68 mL/min — ABNORMAL LOW (ref >90)
GFR, EST AFRICAN AMERICAN: 79 mL/min — AB (ref >90)
Glucose, Bld: 163 mg/dL — ABNORMAL HIGH (ref 70–99)
Potassium: 4.4 mmol/L (ref 3.5–5.1)
Sodium: 134 mmol/L — ABNORMAL LOW (ref 135–145)

## 2014-09-20 NOTE — Progress Notes (Signed)
OT Cancellation Note  Patient Details Name: John Gamble MRN: [id number] DOB: 02-May-1931   Cancelled Treatment:    Reason Eval/Treat Not Completed: Patient with nausea/dry heaving after PT session. Will check on tomorrow for OT evaluation.  Michio Thier A 09/20/2014, 11:16 AM

## 2014-09-20 NOTE — Evaluation (Signed)
Physical Therapy Evaluation Patient Details Name: John Gamble MRN: [id number] DOB: Jun 25, 1931 Today's Date: 09/20/2014   History of Present Illness  81 yo male s/p L TKA 09/19/14  Clinical Impression  On eval, pt required Min assist for mobility-able to ambulate ~75 feet with RW. Pt tolerated activity well. Plan is for SNF to continue rehab.     Follow Up Recommendations SNF    Equipment Recommendations  None recommended by PT    Recommendations for Other Services OT consult     Precautions / Restrictions Precautions Precautions: Fall Restrictions Weight Bearing Restrictions: No LLE Weight Bearing: Weight bearing as tolerated      Mobility  Bed Mobility Overal bed mobility: Needs Assistance Bed Mobility: Supine to Sit     Supine to sit: Min assist     General bed mobility comments: Assist for L LE.   Transfers Overall transfer level: Needs assistance Equipment used: Rolling walker (2 wheeled) Transfers: Sit to/from Stand Sit to Stand: Min assist         General transfer comment: Assist to rise, stabilize, control descent. VCs safety, technique, hand placement.   Ambulation/Gait Ambulation/Gait assistance: Min assist Ambulation Distance (Feet): 75 Feet Assistive device: Rolling walker (2 wheeled) Gait Pattern/deviations: Step-to pattern;Antalgic;Decreased stance time - left     General Gait Details: Assist to stabilize. VCs safety, technique, sequence, distance from RW.   Stairs            Wheelchair Mobility    Modified Rankin (Stroke Patients Only)       Balance                                             Pertinent Vitals/Pain Pain Assessment: Faces Faces Pain Scale: Hurts little more Pain Location: L knee Pain Descriptors / Indicators: Aching Pain Intervention(s): Monitored during session;Ice applied;Repositioned    Home Living Family/patient expects to be discharged to:: Skilled nursing facility Living  Arrangements: Spouse/significant other             Home Equipment: Walker - 2 wheels;Cane - single point      Prior Function Level of Independence: Independent               Hand Dominance        Extremity/Trunk Assessment   Upper Extremity Assessment: Defer to OT evaluation           Lower Extremity Assessment: LLE deficits/detail   LLE Deficits / Details: hip flex at least 3/5, hip abd/add at least 3/5, moves ankle well  Cervical / Trunk Assessment: Normal  Communication   Communication: No difficulties  Cognition Arousal/Alertness: Awake/alert Behavior During Therapy: WFL for tasks assessed/performed Overall Cognitive Status: Within Functional Limits for tasks assessed                      General Comments      Exercises Total Joint Exercises Ankle Circles/Pumps: AROM;Both;10 reps;Supine Quad Sets: AROM;Both;10 reps;Supine Heel Slides: AAROM;Left;10 reps;Supine Hip ABduction/ADduction: AROM;Left;10 reps;Supine Straight Leg Raises: AROM;Left;10 reps;Supine Goniometric ROM: 15-75 degrees      Assessment/Plan    PT Assessment Patient needs continued PT services  PT Diagnosis Difficulty walking;Acute pain   PT Problem List Decreased strength;Decreased range of motion;Decreased activity tolerance;Decreased balance;Decreased mobility;Pain;Decreased knowledge of use of DME  PT Treatment Interventions DME instruction;Gait training;Functional mobility training;Therapeutic activities;Therapeutic exercise;Patient/family education;Balance training  PT Goals (Current goals can be found in the Care Plan section) Acute Rehab PT Goals Patient Stated Goal: to rehab to continue therapy PT Goal Formulation: With patient Time For Goal Achievement: 09/27/14 Potential to Achieve Goals: Good    Frequency 7X/week   Barriers to discharge        Co-evaluation               End of Session Equipment Utilized During Treatment: Gait belt Activity  Tolerance: Patient tolerated treatment well Patient left: in chair;with call bell/phone within reach           Time: 0949-1020 PT Time Calculation (min) (ACUTE ONLY): 31 min   Charges:   PT Evaluation $Initial PT Evaluation Tier I: 1 Procedure PT Treatments $Gait Training: 8-22 mins   PT G Codes:        Weston Anna, MPT Pager: [phone]

## 2014-09-20 NOTE — Progress Notes (Signed)
     Subjective: 1 Day Post-Op Procedure(s) (LRB): LEFT TOTAL KNEE ARTHROPLASTY (Left)   Patient reports pain as mild, pain controlled with medication.  No events throughout the night.  When he sat up on the side of the bed this morning he had N&V.  Feels better now, sitting in bed and after receiving some medicine.   Objective:   VITALS:   Filed Vitals:   09/20/14 0553  BP: 152/83  Pulse: 59  Temp: 97 F (36.1 C)  Resp: 16    Dorsiflexion/Plantar flexion intact Incision: dressing C/D/I No cellulitis present Compartment soft  LABS  Recent Labs  09/20/14 0518  HGB 12.1*  HCT 36.0*  WBC 10.2  PLT 218     Recent Labs  09/20/14 0518  NA 134*  K 4.4  BUN 17  CREATININE 1.00  GLUCOSE 163*     Assessment/Plan: 1 Day Post-Op Procedure(s) (LRB): LEFT TOTAL KNEE ARTHROPLASTY (Left) HV drain d/c'ed Foley cath d/c'ed Advance diet Up with therapy D/C IV fluids Discharge to SNF ([facility]) eventually when ready     West Pugh. Elliot Meldrum   PAC  09/20/2014, 9:00 AM

## 2014-09-20 NOTE — Clinical Social Work Placement (Addendum)
Clinical Social Work Department CLINICAL SOCIAL WORK PLACEMENT NOTE 09/20/2014  Patient:  John Gamble, John Gamble  Account Number:  [id number] Admit date:  09/19/2014  Clinical Social Worker:  Dede Query, CLINICAL SOCIAL WORKER  Date/time:  09/20/2014 02:54 PM  Clinical Social Work is seeking post-discharge placement for this patient at the following level of care:   SKILLED NURSING   (*CSW will update this form in Epic as items are completed)   09/20/2014  Patient/family provided with [place] Department of Clinical Social Work's list of facilities offering this level of care within the geographic area requested by the patient (or if unable, by the patient's family).  09/20/2014  Patient/family informed of their freedom to choose among providers that offer the needed level of care, that participate in Medicare, Medicaid or managed care program needed by the patient, have an available bed and are willing to accept the patient.  09/20/2014  Patient/family informed of [facility]' ownership interest in [facility], as well as of the fact that they are under no obligation to receive care at this facility.  PASARR submitted to EDS on 09/20/2014 PASARR number received on 09/20/2014  FL2 transmitted to all facilities in geographic area requested by pt/family on  09/20/2014 FL2 transmitted to all facilities within larger geographic area on   Patient informed that his/her managed care company has contracts with or will negotiate with  certain facilities, including the following:     Patient/family informed of bed offers received:  09/21/2014 Patient chooses bed at [facility] recommends and patient chooses bed at    Patient to be transferred to  on  [place] on 09/22/2014 Patient to be transferred to facility by pt wife via private vehicle Patient and family notified of transfer on 09/22/2014 Name of family member notified:  Pt notified at bedside, pt wife, Raquel Sarna  notified via telephone.  The following physician request were entered in Epic:   Additional Comments:  .Dede Query, [place] Worker - Weekend Coverage cell #: [phone]   Alison Murray, MSW, LCSW Clinical Social Work Coverage for [employer], [place]

## 2014-09-20 NOTE — Clinical Social Work Psychosocial (Signed)
Clinical Social Work Department BRIEF PSYCHOSOCIAL ASSESSMENT 09/20/2014  Patient:  John Gamble, John Gamble     Account Number:  [id number]     Admit date:  09/19/2014  Clinical Social Worker:  Dede Query, CLINICAL SOCIAL WORKER  Date/Time:  09/20/2014 02:46 PM  Referred by:  Physician  Date Referred:  09/20/2014 Referred for  SNF Placement   Other Referral:   Interview type:  Patient Other interview type:   and family, wife and daughter    PSYCHOSOCIAL DATA Living Status:  WIFE Admitted from facility:   Level of care:   Primary support name:  Raquel Sarna Primary support relationship to patient:  SPOUSE Degree of support available:   High/ wife is very supportive and available    CURRENT CONCERNS  Other Concerns:    SOCIAL WORK ASSESSMENT / PLAN CSW introduced herself and explained the role of CSW.  CSW prompted pt to discuss history and needs.  CSW provided active listening to pt and family.  CSW explored rehab facilities and placement options.  CSW explained that pt information would be sent to facility of his choice   Assessment/plan status:  Psychosocial Support/Ongoing Assessment of Needs Other assessment/ plan:   Information/referral to community resources:   Send information to [employer]    PATIENT'S/FAMILY'S RESPONSE TO PLAN OF CARE: Pt called his wife and daughter back to his room so CSW could meet with everyone.  Pt talked about how he had visited [facility] in [place] and had provided them with his insurance information.  Pt's wife and daughter stated they were grateful for the help from [place].  Pt stated that he has never needed a rehab facility but that  he was impressed with [facility] and was looking forward to gaining his strength back   .Dede Query, LCSW [facility] Clinical Social Worker - Weekend Coverage cell #: [phone]

## 2014-09-20 NOTE — Progress Notes (Signed)
Physical Therapy Treatment Patient Details Name: John Gamble MRN: [id number] DOB: 04-13-1931 Today's Date: 09/20/2014    History of Present Illness 81 yo male s/p L TKA 09/19/14    PT Comments    Progressing with mobility.   Follow Up Recommendations  SNF     Equipment Recommendations  None recommended by PT    Recommendations for Other Services OT consult     Precautions / Restrictions Precautions Precautions: Fall Restrictions Weight Bearing Restrictions: No LLE Weight Bearing: Weight bearing as tolerated    Mobility  Bed Mobility Overal bed mobility: Needs Assistance Bed Mobility: Supine to Sit;Sit to Supine     Supine to sit: Min assist Sit to supine: Min assist   General bed mobility comments: Assist for L LE.   Transfers Overall transfer level: Needs assistance Equipment used: Rolling walker (2 wheeled) Transfers: Sit to/from Stand Sit to Stand: Min assist         General transfer comment: Assist to rise, stabilize, control descent. VCs safety, technique, hand placement.   Ambulation/Gait Ambulation/Gait assistance: Min assist Ambulation Distance (Feet): 150 Feet Assistive device: Rolling walker (2 wheeled) Gait Pattern/deviations: Step-to pattern;Antalgic;Decreased stance time - left     General Gait Details: Assist to stabilize. VCs safety, technique, sequence, distance from RW, pacing.    Stairs            Wheelchair Mobility    Modified Rankin (Stroke Patients Only)       Balance                                    Cognition Arousal/Alertness: Awake/alert Behavior During Therapy: WFL for tasks assessed/performed Overall Cognitive Status: Within Functional Limits for tasks assessed                      Exercises Total Joint Exercises Ankle Circles/Pumps: AROM;Both;10 reps;Supine Quad Sets: AROM;Both;10 reps;Supine Heel Slides: AAROM;Left;10 reps;Supine Hip ABduction/ADduction: AROM;Left;10  reps;Supine Straight Leg Raises: AROM;Left;10 reps;Supine Goniometric ROM: 15-75 degrees    General Comments        Pertinent Vitals/Pain Pain Assessment: 0-10 Pain Score: 6  Pain Location: L knee Pain Descriptors / Indicators: Aching;Sore Pain Intervention(s): Monitored during session;Ice applied;Repositioned    Home Living                      Prior Function            PT Goals (current goals can now be found in the care plan section) Acute Rehab PT Goals Patient Stated Goal: to rehab to continue therapy PT Goal Formulation: With patient Time For Goal Achievement: 09/27/14 Potential to Achieve Goals: Good Progress towards PT goals: Progressing toward goals    Frequency  7X/week    PT Plan Current plan remains appropriate    Co-evaluation             End of Session Equipment Utilized During Treatment: Gait belt Activity Tolerance: Patient tolerated treatment well Patient left: in bed;with call bell/phone within reach;with family/visitor present     Time: 6767-2094 PT Time Calculation (min) (ACUTE ONLY): 19 min  Charges:  $Gait Training: 8-22 mins                    G Codes:      John Gamble, MPT Pager: [phone]

## 2014-09-21 LAB — CBC
HCT: 32.3 % — ABNORMAL LOW (ref 39.0–52.0)
Hemoglobin: 11 g/dL — ABNORMAL LOW (ref 13.0–17.0)
MCH: 31.6 pg (ref 26.0–34.0)
MCHC: 34.1 g/dL (ref 30.0–36.0)
MCV: 92.8 fL (ref 78.0–100.0)
Platelets: 226 10*3/uL (ref 150–400)
RBC: 3.48 MIL/uL — AB (ref 4.22–5.81)
RDW: 13.2 % (ref 11.5–15.5)
WBC: 9.1 10*3/uL (ref 4.0–10.5)

## 2014-09-21 MED ORDER — BACLOFEN 10 MG PO TABS: 10.0000 mg | ORAL_TABLET | Freq: Three times a day (TID) | ORAL | Status: DC | PRN

## 2014-09-21 MED ORDER — OXYCODONE-ACETAMINOPHEN 5-325 MG PO TABS: 1.0000 | ORAL_TABLET | ORAL | Status: DC | PRN

## 2014-09-21 MED ORDER — HYDROCHLOROTHIAZIDE 12.5 MG PO CAPS
12.5000 mg | ORAL_CAPSULE | Freq: Every day | ORAL | Status: DC
  Administered 2014-09-21 – 2014-09-22 (×2): 12.5 mg via ORAL
  Filled 2014-09-21 (×2): qty 1

## 2014-09-21 MED ORDER — POLYETHYLENE GLYCOL 3350 17 G PO PACK
17.0000 g | PACK | Freq: Every day | ORAL | Status: DC
  Administered 2014-09-21: 17 g via ORAL

## 2014-09-21 MED ORDER — ASPIRIN EC 325 MG PO TBEC: 325.0000 mg | DELAYED_RELEASE_TABLET | Freq: Two times a day (BID) | ORAL | Status: DC

## 2014-09-21 MED ORDER — FERROUS SULFATE 325 (65 FE) MG PO TABS: 325.0000 mg | ORAL_TABLET | Freq: Three times a day (TID) | ORAL | Status: DC

## 2014-09-21 NOTE — Clinical Social Work Note (Signed)
CSW met with pt at bedside to provide information about his SNF choice  Pt had requested [facility] for his rehab needs and this facility said yes to a bed for him so CSW provided this information to pt  Pt stated that MD will discharge him tomorrow and his wife will be available to help with discharge  CSW will follow up with pt needs until discharge  .Dede Query, LCSW [facility] Clinical Social Worker - Weekend Coverage cell #: [phone]

## 2014-09-21 NOTE — Progress Notes (Signed)
Occupational Therapy Evaluation Patient Details Name: John Gamble MRN: [id number] DOB: 03/18/1931 Today's Date: 09/21/2014    History of Present Illness 81 yo male s/p L TKA 09/19/14   Clinical Impression   Patient plans to go to rehab with probable d/c to SNF tomorrow. He presents with decreased ADL independence and safety and will benefit from skilled OT to maximize independence.    Follow Up Recommendations  SNF;Supervision/Assistance - 24 hour    Equipment Recommendations  Other (comment) (tbd at SNF)    Recommendations for Other Services       Precautions / Restrictions Precautions Precautions: Fall Restrictions Weight Bearing Restrictions: No LLE Weight Bearing: Weight bearing as tolerated      Mobility Bed Mobility                  Transfers                      Balance                                            ADL Overall ADL's : Needs assistance/impaired Eating/Feeding: Independent;Bed level   Grooming: Set up;Sitting;Bed level   Upper Body Bathing: Minimal assitance;Sitting;Bed level   Lower Body Bathing: Moderate assistance;Sit to/from stand;Maximal assistance   Upper Body Dressing : Minimal assistance;Sitting;Bed level   Lower Body Dressing: Moderate assistance;Maximal assistance;Sit to/from stand                 General ADL Comments: Patient received in bed, reports he has not received breakfast yet and does not want to get up until he's eaten. Agreeable to bed level evaluation. Patient up with PT yesterday. Patient states his plan is for SNF rehab. He has difficulty reaching LEs for LB self-care. Nurse tech also reports patient tries to get up without assistance when he has to toilet and needs cues on safe techniques and hand placement.      Vision     Perception     Praxis      Pertinent Vitals/Pain Pain Assessment: 0-10 Pain Score: 4  Pain Location: L knee Pain Descriptors / Indicators:  Aching Pain Intervention(s): Monitored during session     Hand Dominance Right   Extremity/Trunk Assessment Upper Extremity Assessment Upper Extremity Assessment: Overall WFL for tasks assessed   Lower Extremity Assessment Lower Extremity Assessment: Defer to PT evaluation   Cervical / Trunk Assessment Cervical / Trunk Assessment: Normal   Communication Communication Communication: No difficulties   Cognition Arousal/Alertness: Awake/alert Behavior During Therapy: WFL for tasks assessed/performed Overall Cognitive Status: Within Functional Limits for tasks assessed                     General Comments       Exercises       Shoulder Instructions      Home Living Family/patient expects to be discharged to:: Skilled nursing facility Living Arrangements: Spouse/significant other                           Home Equipment: Walker - 2 wheels;Cane - single point          Prior Functioning/Environment Level of Independence: Independent             OT Diagnosis: Acute pain;Generalized weakness   OT Problem List: Decreased  strength;Decreased range of motion;Decreased safety awareness;Decreased knowledge of use of DME or AE;Pain   OT Treatment/Interventions: Self-care/ADL training;DME and/or AE instruction;Therapeutic activities;Patient/family education    OT Goals(Current goals can be found in the care plan section) Acute Rehab OT Goals Patient Stated Goal: to rehab to continue therapy OT Goal Formulation: With patient Time For Goal Achievement: 10/05/14 Potential to Achieve Goals: Good  OT Frequency: Min 2X/week   Barriers to D/C:            Co-evaluation              End of Session    Activity Tolerance: Patient tolerated treatment well Patient left: in bed;with call bell/phone within reach   Time: 0851-0904 OT Time Calculation (min): 13 min Charges:  OT General Charges $OT Visit: 1 Procedure OT Evaluation $Initial OT  Evaluation Tier I: 1 Procedure G-Codes:    John Gamble A 10/08/14, 9:16 AM

## 2014-09-21 NOTE — Progress Notes (Signed)
Subjective: 2 Days Post-Op Procedure(s) (LRB): LEFT TOTAL KNEE ARTHROPLASTY (Left) Patient reports pain as moderate to left knee.  Reports a good night. Tolerating PO's. No BM. Progressing with PT. Denies CP, SOb, or calf pain.  Objective: Vital signs in last 24 hours: Temp:  [97.5 F (36.4 C)-98.3 F (36.8 C)] 98.3 F (36.8 C) (02/21 0514) Pulse Rate:  [86-109] 86 (02/21 0514) Resp:  [16] 16 (02/21 0514) BP: (138-151)/(63-73) 138/63 mmHg (02/21 0514) SpO2:  [95 %-97 %] 95 % (02/21 0514)  Intake/Output from previous day: 02/20 0701 - 02/21 0700 In: 1745 [P.O.:840; I.V.:905] Out: 1450 [Urine:1450] Intake/Output this shift:     Recent Labs  09/20/14 0518 09/21/14 0500  HGB 12.1* 11.0*    Recent Labs  09/20/14 0518 09/21/14 0500  WBC 10.2 9.1  RBC 3.86* 3.48*  HCT 36.0* 32.3*  PLT 218 226    Recent Labs  09/20/14 0518  NA 134*  K 4.4  CL 102  CO2 28  BUN 17  CREATININE 1.00  GLUCOSE 163*  CALCIUM 8.6   No results for input(s): LABPT, INR in the last 72 hours.  Alert and oriented x3. RRR, Lungs clear, BS x4. Left Calf soft and non tender. L knee dressing C/D/I. No DVT signs. No signs of infection or compartment syndrome. LLE neurovascularly intact.   Assessment/Plan: 2 Days Post-Op Procedure(s) (LRB): LEFT TOTAL KNEE ARTHROPLASTY (Left) Up with PT Plan D/c to [facility] Tomorrow If doing well with PT possible D/c today.  Continue current care  Constipation: Add Miralax Monitor  Shaily Librizzi L 09/21/2014, 8:10 AM

## 2014-09-21 NOTE — Progress Notes (Signed)
Physical Therapy Treatment Patient Details Name: John Gamble MRN: [id number] DOB: [birth year] Today's Date: 09/21/2014    History of Present Illness 81 yo male s/p L TKA 09/19/14    PT Comments    POD # 2 am session.  Assisted pt OOB to amb to BR.  Assisted off lower level commode then amb a limited distance in hallway due to increased c/o fatigue and pain level "higher than yesterday".  Returned to room and positioned in recler.  Pt too fatigued to do anything else, so applied ICE to knee.  Follow Up Recommendations  SNF ([facility])     Equipment Recommendations       Recommendations for Other Services       Precautions / Restrictions Precautions Precautions: Fall Restrictions Weight Bearing Restrictions: No LLE Weight Bearing: Weight bearing as tolerated    Mobility  Bed Mobility Overal bed mobility: Needs Assistance Bed Mobility: Supine to Sit     Supine to sit: Min assist     General bed mobility comments: Min assist L LE only and increased time  Transfers Overall transfer level: Needs assistance Equipment used: Rolling walker (2 wheeled) Transfers: Sit to/from Stand Sit to Stand: Min assist         General transfer comment: Assist to rise, stabilize, control descent. VCs safety, technique, hand placement.   Ambulation/Gait Ambulation/Gait assistance: Min assist Ambulation Distance (Feet): 85 Feet Assistive device: Rolling walker (2 wheeled) Gait Pattern/deviations: Step-to pattern Gait velocity: decreased   General Gait Details: decreased amb distance this session due to increased c/o fatigue and mild nausea.     Stairs            Wheelchair Mobility    Modified Rankin (Stroke Patients Only)       Balance                                    Cognition Arousal/Alertness: Awake/alert Behavior During Therapy: WFL for tasks assessed/performed Overall Cognitive Status: Within Functional Limits for tasks assessed                      Exercises      General Comments        Pertinent Vitals/Pain Pain Assessment: 0-10 Pain Score: 5  Pain Location: L knee Pain Descriptors / Indicators: Sore;Tender Pain Intervention(s): Monitored during session;Premedicated before session;Repositioned;Ice applied    Home Living                      Prior Function            PT Goals (current goals can now be found in the care plan section) Progress towards PT goals: Progressing toward goals    Frequency  7X/week    PT Plan Current plan remains appropriate    Co-evaluation             End of Session Equipment Utilized During Treatment: Gait belt Activity Tolerance: Patient limited by fatigue Patient left: in chair;with call bell/phone within reach     Time: 1030-1100 PT Time Calculation (min) (ACUTE ONLY): 30 min  Charges:  $Gait Training: 8-22 mins $Therapeutic Activity: 8-22 mins                    G Codes:      Rica Koyanagi  PTA [facility]  Acute  Rehab Pager  [phone]  

## 2014-09-21 NOTE — Progress Notes (Signed)
Utilization Review Completed.Ronav Furney T2/21/2016  

## 2014-09-21 NOTE — Progress Notes (Signed)
CARE MANAGEMENT NOTE 09/21/2014  Patient:  John Gamble, John Gamble   Account Number:  [id number]  Date Initiated:  09/21/2014  Documentation initiated by:  [facility]  Subjective/Objective Assessment:   LEFT TOTAL KNEE ARTHROPLASTY     Action/Plan:   Anticipated DC Date:  09/22/2014   Anticipated DC Plan:  SKILLED NURSING FACILITY  In-house referral  Clinical Social Worker      DC Planning Services  CM consult      Choice offered to / List presented to:             Status of service:  Completed, signed off Medicare Important Message given?  YES (If response is "NO", the following Medicare IM given date fields will be blank) Date Medicare IM given:  09/21/2014 Medicare IM given by:  [facility] Date Additional Medicare IM given:   Additional Medicare IM given by:    Discharge Disposition:  [place]  Per UR Regulation:  Reviewed for med. necessity/level of care/duration of stay  If discussed at [place] of Stay Meetings, dates discussed:    Comments:  09/21/2014 1120 Chart reviewed. CSW following for SNF placement. Jonnie Finner RN CCM Case Mgmt phone [phone]

## 2014-09-21 NOTE — Progress Notes (Signed)
Physical Therapy Treatment Patient Details Name: John Gamble MRN: [id number] DOB: 08/30/1930 Today's Date: 09/21/2014    History of Present Illness 81 yo male s/p L TKA 09/19/14    PT Comments    POD # 2 pm session.  Pt back in bed from just using bathroom with MAX c/o fatigue so performed TE's only.  Pt stated, "I think I did too much yesterday".    Follow Up Recommendations  SNF ([facility])     Equipment Recommendations       Recommendations for Other Services       Precautions / Restrictions Precautions Precautions: Fall Restrictions Weight Bearing Restrictions: No LLE Weight Bearing: Weight bearing as tolerated    Mobility  Bed Mobility Ambulation/Gait Stairs            Wheelchair Mobility    Modified Rankin (Stroke Patients Only)       Balance                                    Cognition Arousal/Alertness: Awake/alert Behavior During Therapy: WFL for tasks assessed/performed Overall Cognitive Status: Within Functional Limits for tasks assessed                      Exercises   Total Knee Replacement TE's 10 reps B LE ankle pumps 10 reps towel squeezes 10 reps knee presses 10 reps heel slides  10 reps SAQ's 10 reps SLR's 10 reps ABD Followed by ICE     General Comments        Pertinent Vitals/Pain Pain Assessment: 0-10 Pain Score: 5  Pain Location: L knee Pain Descriptors / Indicators: Sore;Tender Pain Intervention(s): Monitored during session;Premedicated before session;Repositioned;Ice applied    Home Living                      Prior Function            PT Goals (current goals can now be found in the care plan section) Progress towards PT goals: Progressing toward goals    Frequency  7X/week    PT Plan Current plan remains appropriate    Co-evaluation             End of Session Equipment Utilized During Treatment: Gait belt Activity Tolerance: Patient limited by  fatigue Patient left: in chair;with call bell/phone within reach     Time: 1235-1255 PT Time Calculation (min) (ACUTE ONLY): 20 min  Charges:   $Therapeutic Exercise: 8-22 mins                    G Codes:      Rica Koyanagi  PTA [facility]  Acute  Rehab Pager      [phone]

## 2014-09-22 ENCOUNTER — Encounter (HOSPITAL_COMMUNITY): Payer: Self-pay | Admitting: Specialist

## 2014-09-22 DIAGNOSIS — Z471 Aftercare following joint replacement surgery: Secondary | ICD-10-CM | POA: Diagnosis not present

## 2014-09-22 DIAGNOSIS — G8918 Other acute postprocedural pain: Secondary | ICD-10-CM | POA: Diagnosis not present

## 2014-09-22 DIAGNOSIS — I119 Hypertensive heart disease without heart failure: Secondary | ICD-10-CM | POA: Diagnosis not present

## 2014-09-22 DIAGNOSIS — D649 Anemia, unspecified: Secondary | ICD-10-CM | POA: Diagnosis not present

## 2014-09-22 DIAGNOSIS — Z96652 Presence of left artificial knee joint: Secondary | ICD-10-CM | POA: Diagnosis not present

## 2014-09-22 DIAGNOSIS — M1712 Unilateral primary osteoarthritis, left knee: Secondary | ICD-10-CM | POA: Diagnosis not present

## 2014-09-22 LAB — CBC
HEMATOCRIT: 29.7 % — AB (ref 39.0–52.0)
Hemoglobin: 10.1 g/dL — ABNORMAL LOW (ref 13.0–17.0)
MCH: 31.6 pg (ref 26.0–34.0)
MCHC: 34 g/dL (ref 30.0–36.0)
MCV: 92.8 fL (ref 78.0–100.0)
Platelets: 205 10*3/uL (ref 150–400)
RBC: 3.2 MIL/uL — ABNORMAL LOW (ref 4.22–5.81)
RDW: 13.2 % (ref 11.5–15.5)
WBC: 8.2 10*3/uL (ref 4.0–10.5)

## 2014-09-22 NOTE — Progress Notes (Signed)
Pt given discharge instructions and instructed to give to Staff at [facility] in [place]. Spoke with Estill Bamberg at facility and gave report. All questions answered. Pt left in NAD with wife and granddaughter.

## 2014-09-22 NOTE — Progress Notes (Signed)
Subjective: 3 Days Post-Op Procedure(s) (LRB): LEFT TOTAL KNEE ARTHROPLASTY (Left) Patient reports pain as mild.  Locate dto left knee. No complications. Progressing with PT. Tolerating Po's well. Positive Flatus, but no BM. No V/N. Denies SOB, CP, or calf pain.   Objective: Vital signs in last 24 hours: Temp:  [98 F (36.7 C)-98.3 F (36.8 C)] 98 F (36.7 C) (02/22 0644) Pulse Rate:  [91-97] 97 (02/22 0644) Resp:  [16-18] 16 (02/22 0644) BP: (125-130)/(63-80) 125/63 mmHg (02/22 0644) SpO2:  [90 %-98 %] 90 % (02/22 0644)  Intake/Output from previous day: 02/21 0701 - 02/22 0700 In: 1080 [P.O.:1080] Out: 450 [Urine:450] Intake/Output this shift:     Recent Labs  09/20/14 0518 09/21/14 0500 09/22/14 0515  HGB 12.1* 11.0* 10.1*    Recent Labs  09/21/14 0500 09/22/14 0515  WBC 9.1 8.2  RBC 3.48* 3.20*  HCT 32.3* 29.7*  PLT 226 205    Recent Labs  09/20/14 0518  NA 134*  K 4.4  CL 102  CO2 28  BUN 17  CREATININE 1.00  GLUCOSE 163*  CALCIUM 8.6   No results for input(s): LABPT, INR in the last 72 hours.  Alert and oriented x3. RRR, Lungs clear, BS x4. Left Calf soft and non tender. L knee dressing C/D/I. No DVT signs. No signs of infection or compartment syndrome. LLE neurovascularly intact.   Assessment/Plan: 3 Days Post-Op Procedure(s) (LRB): LEFT TOTAL KNEE ARTHROPLASTY (Left) D/c to [facility] today. Up with PT F/u in the office in 2 weeks. Follow instructions Take medication as directed   Brodric Schauer L 09/22/2014, 7:45 AM

## 2014-09-22 NOTE — Discharge Summary (Signed)
Physician Discharge Summary  Patient ID: John Gamble MRN: [id number] DOB/AGE: 07-Sep-1930 81 y.o.  Admit date: 09/19/2014 Discharge date: 09/22/2014  Admission Diagnoses: Left knee primary osteoarthritis  Discharge Diagnoses:  Active Problems:   Primary osteoarthritis of knee   S/P knee replacement   S/P total knee arthroplasty   Discharged Condition: Good  Hospital Course:  John Gamble is a 81 y.o. who was admitted to [facility]. They were brought to the operating room on 09/19/2014 and underwent Procedure(s): LEFT TOTAL KNEE ARTHROPLASTY.  Patient tolerated the procedure well and was later transferred to the recovery room and then to the orthopaedic floor for postoperative care.  They were given PO and IV analgesics for pain control following their surgery.  They were given 24 hours of postoperative antibiotics of  Anti-infectives    Start     Dose/Rate Route Frequency Ordered Stop   09/20/14 1000  acyclovir (ZOVIRAX) tablet 800 mg     800 mg Oral Daily 09/19/14 1541     09/19/14 2000  ceFAZolin (ANCEF) IVPB 2 g/50 mL premix     2 g 100 mL/hr over 30 Minutes Intravenous Every 6 hours 09/19/14 1657 09/20/14 0759   09/19/14 1047  ceFAZolin (ANCEF) IVPB 2 g/50 mL premix  Status:  Discontinued     2 g 100 mL/hr over 30 Minutes Intravenous On call to O.R. 09/19/14 1047 09/19/14 1541   09/19/14 1047  vancomycin (VANCOCIN) IVPB 1000 mg/200 mL premix  Status:  Discontinued     1,000 mg 200 mL/hr over 60 Minutes Intravenous 30 min pre-op 09/19/14 1047 09/19/14 1319     and started on DVT prophylaxis in the form of Lovenox.   PT and OT were ordered for total joint protocol.  Discharge planning consulted to help with postop disposition and equipment needs.  Patient had a good night on the evening of surgery and started to get up OOB with therapy on day one.  Hemovac drain was pulled without difficulty.  Continued to work with therapy into day two.  Dressing was WNL.  By day three,  the patient had progressed with therapy and meeting their goals. Patient was seen in rounds and was ready to go home.  Consults: N/A  Significant Diagnostic Studies: Routine TKA  Treatments: Routine TKA  Discharge Exam: Blood pressure 125/63, pulse 97, temperature 98 F (36.7 C), temperature source Oral, resp. rate 16, height 6\' 1"  (1.854 m), weight 86.183 kg (190 lb), SpO2 90 %. Alert and oriented x3. RRR, Lungs clear, BS x4. Left Calf soft and non tender. L knee dressing C/D/I. No DVT signs. No signs of infection or compartment syndrome. LLE neurovascularly intact.   Disposition: 01-Home or Self Care  Discharge Instructions    Call MD / Call 911    Complete by:  As directed   If you experience chest pain or shortness of breath, CALL 911 and be transported to the hospital emergency room.  If you develope a fever above 101 F, pus (white drainage) or increased drainage or redness at the wound, or calf pain, call your surgeon's office.     Change dressing    Complete by:  As directed   Leave water proof dressing in place.     Constipation Prevention    Complete by:  As directed   Drink plenty of fluids.  Prune juice may be helpful.  You may use a stool softener, such as Colace (over the counter) 100 mg twice a day.  Use MiraLax (over the counter) for constipation as needed.     Diet - low sodium heart healthy    Complete by:  As directed      Discharge instructions    Complete by:  As directed   Call and make an appointment for 2 weeks from now ([phone]). Leave dressing in place, may remove the drain site dressing on the side. Place a band aid and neosporin over the site. May shower. Start home PT as directed. Follow D/C instructions. Take medications as directed. Weight bear as tolerated with assistive devices.     Do not put a pillow under the knee. Place it under the heel.    Complete by:  As directed      Increase activity slowly as tolerated    Complete by:  As directed       TED hose    Complete by:  As directed   Use stockings (TED hose) for 2 weeks on bilateral leg(s).  You may remove them at night for sleeping.            Medication List    STOP taking these medications        aspirin 81 MG tablet  Replaced by:  aspirin EC 325 MG tablet      TAKE these medications        acyclovir 800 MG tablet  Commonly known as:  ZOVIRAX  Take 800 mg by mouth daily.     aspirin EC 325 MG tablet  Take 1 tablet (325 mg total) by mouth 2 (two) times daily.     baclofen 10 MG tablet  Commonly known as:  LIORESAL  Take 1 tablet (10 mg total) by mouth 3 (three) times daily as needed for muscle spasms.     fenofibrate 145 MG tablet  Commonly known as:  TRICOR  Take 1 tablet (145 mg total) by mouth daily.     ferrous sulfate 325 (65 FE) MG tablet  Take 1 tablet (325 mg total) by mouth 3 (three) times daily after meals.     Fish Oil 1200 MG Caps  Take 1,200 mg by mouth daily.     hydrochlorothiazide 25 MG tablet  Commonly known as:  HYDRODIURIL  Take 0.5 tablets (12.5 mg total) by mouth daily.     lovastatin 20 MG tablet  Commonly known as:  MEVACOR  Take 1 tablet (20 mg total) by mouth at bedtime.     MIRALAX PO  Take 17 g by mouth daily.     multivitamin with minerals Tabs tablet  Take 1 tablet by mouth daily.     OSTEO BI-FLEX REGULAR STRENGTH PO  Take 2 tablets by mouth 2 (two) times daily.     oxyCODONE-acetaminophen 5-325 MG per tablet  Commonly known as:  ROXICET  Take 1-2 tablets by mouth every 4 (four) hours as needed for moderate pain or severe pain.     PRED FORTE 1 % ophthalmic suspension  Generic drug:  prednisoLONE acetate  Place 1 drop into the left eye at bedtime.     quinapril 40 MG tablet  Commonly known as:  ACCUPRIL  Take 1 tablet (40 mg total) by mouth at bedtime.     Zinc 50 MG Caps  Take 50 mg by mouth daily.       F/U in the office in 2 weeks with Dr. Maryan Char. Call for appointment  [phone].   SignedLajean Manes 09/22/2014, 7:41 AM

## 2014-09-22 NOTE — Discharge Instructions (Signed)
Call and make an appointment for 2 weeks from now ([phone]). Leave dressing in place, may remove the drain site dressing on the side. Place a band aid and neosporin over the site. May shower. Rehab for PT. Follow D/C instructions. Take medications as directed. Weight bear as tolerated with assistive devices.

## 2014-09-22 NOTE — Progress Notes (Signed)
Pt for discharge to [facility].   CSW facilitated pt discharge needs including contacting facility, faxing pt discharge information via [facility], discussing with pt at bedside and notifying pt wife via telephone, providing RN phone number to call report, and providing discharge packet to pt RN to provide to pt once pt wife arrives to transport pt. Pt wife plans to transport via private vehicle.   Pt appreciative of assistance. Pt complains of pain and concerned about being comfortable for the transport to [facility] from a pain standpoint. CSW notified RN.   No further social work needs identified at this time.  CSW signing off.   Alison Murray, MSW, [place] [facility] [phone]

## 2014-09-22 NOTE — Progress Notes (Signed)
Physical Therapy Treatment Patient Details Name: John Gamble MRN: [id number] DOB: 08-09-1930 Today's Date: 09/22/2014    History of Present Illness 81 yo male s/p L TKA 09/19/14    PT Comments    POD # 3 pt feeling better.  Assisted OOB to amb in hallway then assisted to BR.  Returned to bed to perform TKR TE's followed by ICE.  Follow Up Recommendations  SNF ([facility])     Equipment Recommendations  None recommended by PT    Recommendations for Other Services       Precautions / Restrictions Precautions Precautions: Fall Restrictions Weight Bearing Restrictions: No LLE Weight Bearing: Weight bearing as tolerated    Mobility  Bed Mobility Overal bed mobility: Needs Assistance Bed Mobility: Supine to Sit     Supine to sit: Min guard     General bed mobility comments: Min Guard Assist and increased time  Transfers Overall transfer level: Needs assistance Equipment used: Rolling walker (2 wheeled) Transfers: Sit to/from Stand Sit to Stand: Supervision;Min guard         General transfer comment: Assist to rise, stabilize, control descent. VCs safety, technique, hand placement.   Ambulation/Gait Ambulation/Gait assistance: Min guard;Min assist Ambulation Distance (Feet): 95 Feet Assistive device: Rolling walker (2 wheeled) Gait Pattern/deviations: Step-to pattern Gait velocity: decreased   General Gait Details: 25% Vc;s to equal WBing and upright posture   Stairs            Wheelchair Mobility    Modified Rankin (Stroke Patients Only)       Balance                                    Cognition Arousal/Alertness: Awake/alert Behavior During Therapy: WFL for tasks assessed/performed Overall Cognitive Status: Within Functional Limits for tasks assessed                      Exercises   Total Knee Replacement TE's 10 reps B LE ankle pumps 10 reps towel squeezes 10 reps knee presses 10 reps heel slides  10 reps  SAQ's 10 reps SLR's 10 reps ABD Followed by ICE     General Comments        Pertinent Vitals/Pain Pain Assessment: 0-10 Pain Score: 7  Pain Location: L knee Pain Descriptors / Indicators: Tender Pain Intervention(s): Monitored during session;Premedicated before session;Repositioned;Ice applied    Home Living                      Prior Function            PT Goals (current goals can now be found in the care plan section) Progress towards PT goals: Progressing toward goals    Frequency  7X/week    PT Plan Current plan remains appropriate    Co-evaluation             End of Session Equipment Utilized During Treatment: Gait belt Activity Tolerance: Patient limited by fatigue Patient left: in chair;with call bell/phone within reach     Time: 1040-1120 PT Time Calculation (min) (ACUTE ONLY): 40 min  Charges:  $Gait Training: 8-22 mins $Therapeutic Exercise: 8-22 mins $Therapeutic Activity: 8-22 mins                    G Codes:      Rica Koyanagi  PTA [facility]  Acute  Rehab  Pager      [phone]

## 2014-09-25 DIAGNOSIS — I119 Hypertensive heart disease without heart failure: Secondary | ICD-10-CM | POA: Diagnosis not present

## 2014-09-25 DIAGNOSIS — G8918 Other acute postprocedural pain: Secondary | ICD-10-CM | POA: Diagnosis not present

## 2014-09-25 DIAGNOSIS — D649 Anemia, unspecified: Secondary | ICD-10-CM | POA: Diagnosis not present

## 2014-09-25 DIAGNOSIS — Z471 Aftercare following joint replacement surgery: Secondary | ICD-10-CM | POA: Diagnosis not present

## 2014-09-29 ENCOUNTER — Telehealth: Payer: Self-pay | Admitting: Cardiology

## 2014-09-29 NOTE — Telephone Encounter (Signed)
Notified wife that patient will need to be fasting for appointment and confirmed they are aware of location of Dr. Doug Sou office.

## 2014-09-29 NOTE — Telephone Encounter (Signed)
New Message  Pt wife wanted to confirm that pt should fast for this appt 3/9 w/ PJ. NO orders are present, but pt's wife wanted to be sure. Please call back and discuss.

## 2014-09-29 NOTE — Telephone Encounter (Signed)
Yes we will do fasting lab when he comes for appt.  Dorice Stiggers [name] MD, [facility]

## 2014-09-29 NOTE — Telephone Encounter (Signed)
Message deferred to [facility] & Dr. [name] to advise on if patient should have fasting lab work prior to [place] on 3/9 or fast for appointment in anticipation of having lab work after visit.   Last lipid noted in EPIC from 06/2013

## 2014-10-06 ENCOUNTER — Ambulatory Visit: Payer: Medicare Other | Admitting: Cardiology

## 2014-10-08 ENCOUNTER — Encounter: Payer: Self-pay | Admitting: Cardiology

## 2014-10-08 ENCOUNTER — Ambulatory Visit (INDEPENDENT_AMBULATORY_CARE_PROVIDER_SITE_OTHER): Payer: Medicare Other | Admitting: Cardiology

## 2014-10-08 VITALS — BP 142/76 | HR 86 | Ht 73.0 in | Wt 180.5 lb

## 2014-10-08 DIAGNOSIS — I2510 Atherosclerotic heart disease of native coronary artery without angina pectoris: Secondary | ICD-10-CM

## 2014-10-08 DIAGNOSIS — E785 Hyperlipidemia, unspecified: Secondary | ICD-10-CM

## 2014-10-08 DIAGNOSIS — I10 Essential (primary) hypertension: Secondary | ICD-10-CM

## 2014-10-08 NOTE — Progress Notes (Signed)
Cardiology Office Note   Date:  10/08/2014   ID:  John Gamble, DOB 06-Oct-1930, MRN [id number]  PCP:  Teressa Lower, MD  Cardiologist:  Peter [name], MD   Chief Complaint  Patient presents with  . Coronary Artery Disease      History of Present Illness: John Gamble is a 81 y.o. male who presents for follow up CAD.He has a history of coronary disease and is status post stenting of the left circumflex coronary in 2000. He had a normal stress Myoview in July of 2011. He also has a history of hypertension and hyperlipidemia. He is doing very well from a cardiac standpoint without chest pain or SOB. He is s/p left TKR 3 weeks ago by Dr. Theda Sers without complications. Recent labs done by Dr. Garlon Hatchet.     Past Medical History  Diagnosis Date  . Coronary artery disease   . MI, old   . Hypertension   . OA (osteoarthritis)   . Hyperlipidemia   . Pneumonia     hx of 2013     Past Surgical History  Procedure Laterality Date  . Cardiac catheterization  01/26/1999    EF 55%  . Coronary stent placement  2000    LEFT CIRCUMFLEX CORONARY  . Replacement total knee    . Inguinal hernia repair    . Cardiovascular stress test  02/22/2010    EF 61%  . Corneal implant      left  . Total knee arthroplasty Left 09/19/2014    Procedure: LEFT TOTAL KNEE ARTHROPLASTY;  Surgeon: Sydnee Cabal, MD;  Location: [facility];  Service: Orthopedics;  Laterality: Left;     Current Outpatient Prescriptions  Medication Sig Dispense Refill  . acyclovir (ZOVIRAX) 800 MG tablet Take 800 mg by mouth daily.     Marland Kitchen aspirin EC 325 MG tablet Take 1 tablet (325 mg total) by mouth 2 (two) times daily. 60 tablet 0  . fenofibrate (TRICOR) 145 MG tablet Take 1 tablet (145 mg total) by mouth daily. 30 tablet 5  . Glucosamine-Chondroitin (OSTEO BI-FLEX REGULAR STRENGTH PO) Take 2 tablets by mouth 2 (two) times daily.     . hydrochlorothiazide (HYDRODIURIL) 25 MG tablet Take 0.5 tablets (12.5 mg total) by mouth daily. 30  tablet 6  . lovastatin (MEVACOR) 20 MG tablet Take 1 tablet (20 mg total) by mouth at bedtime. 30 tablet 5  . Multiple Vitamin (MULTIVITAMIN WITH MINERALS) TABS tablet Take 1 tablet by mouth daily.    . Omega-3 Fatty Acids (FISH OIL) 1200 MG CAPS Take 1,200 mg by mouth daily.    Marland Kitchen oxyCODONE-acetaminophen (ROXICET) 5-325 MG per tablet Take 1-2 tablets by mouth every 4 (four) hours as needed for moderate pain or severe pain. 60 tablet 0  . Polyethylene Glycol 3350 (MIRALAX PO) Take 17 g by mouth daily.     . prednisoLONE acetate (PRED FORTE) 1 % ophthalmic suspension Place 1 drop into the left eye at bedtime.     . quinapril (ACCUPRIL) 40 MG tablet Take 1 tablet (40 mg total) by mouth at bedtime. 30 tablet 5  . Zinc 50 MG CAPS Take 50 mg by mouth daily.     No current facility-administered medications for this visit.    Allergies:   Oysters and Sulfa antibiotics    Social History:  The patient  reports that he quit smoking about 25 years ago. He has never used smokeless tobacco. He reports that he does not drink alcohol or use  illicit drugs.   Family History:  The patient's family history includes Cancer in his father and mother.    ROS:  Please see the history of present illness.   Otherwise, review of systems are positive for none.   All other systems are reviewed and negative.    PHYSICAL EXAM: VS:  BP 142/76 mmHg  Pulse 86  Ht 6\' 1"  (1.854 m)  Wt 180 lb 8 oz (81.874 kg)  BMI 23.82 kg/m2 , BMI Body mass index is 23.82 kg/(m^2). GEN: Well nourished, well developed, in no acute distress HEENT: normal Neck: no JVD, carotid bruits, or masses Cardiac: RRR; no murmurs, rubs, or gallops,no edema  Respiratory:  clear to auscultation bilaterally, normal work of breathing GI: soft, nontender, nondistended, + BS MS: no deformity or atrophy, surgical incision left knee healing well.  Skin: warm and dry, no rash Neuro:  Strength and sensation are intact Psych: euthymic mood, full  affect   EKG:  EKG is not ordered today. Exg from 09/12/14 shows NSR with LVH. I have personally reviewed and interpreted this study.   Recent Labs: 12/25/2013: ALT 43 09/20/2014: BUN 17; Creatinine 1.00; Potassium 4.4; Sodium 134* 09/22/2014: Hemoglobin 10.1*; Platelets 205    Lipid Panel    Component Value Date/Time   CHOL 136 06/24/2013 0908   TRIG 106.0 06/24/2013 0908   HDL 38.80* 06/24/2013 0908   CHOLHDL 4 06/24/2013 0908   VLDL 21.2 06/24/2013 0908   LDLCALC 76 06/24/2013 0908      Wt Readings from Last 3 Encounters:  10/08/14 180 lb 8 oz (81.874 kg)  09/19/14 190 lb (86.183 kg)  04/02/14 184 lb (83.462 kg)      Other studies Reviewed: Additional studies/ records that were reviewed today include: none.    ASSESSMENT AND PLAN:  1. Coronary disease status post remote stenting of the left circumflex and 2000 with a bare-metal stent. His last nuclear stress test was in July of 2011 and was normal. He remains asymptomatic. We'll continue with his medical therapy and followup again in 6 months.  2. Hypertension, controlled.  3. Hyperlipidemia, well controlled. I have requested a copy of his most recent lab work from primary care.    Current medicines are reviewed at length with the patient today.  The patient does not have concerns regarding medicines.  The following changes have been made:  no change  Labs/ tests ordered today include: none  No orders of the defined types were placed in this encounter.     Disposition:   FU with me in 6 months   Signed, Peter [name], MD  10/08/2014 4:58 PM    [facility] [place], [place], [place]  [address] Phone: [phone]; Fax: [phone]

## 2014-10-08 NOTE — Patient Instructions (Signed)
Continue your current therapy  I will get your lab work from Dr. Garlon Hatchet.  I will see you in 6 months.

## 2014-10-29 ENCOUNTER — Other Ambulatory Visit: Payer: Self-pay | Admitting: *Deleted

## 2014-10-29 MED ORDER — FENOFIBRATE 145 MG PO TABS
145.0000 mg | ORAL_TABLET | Freq: Every day | ORAL | Status: DC
Start: 2014-10-29 — End: 2015-04-13

## 2014-11-05 ENCOUNTER — Other Ambulatory Visit: Payer: Self-pay

## 2014-11-05 MED ORDER — LOVASTATIN 20 MG PO TABS: 20.0000 mg | ORAL_TABLET | Freq: Every day | ORAL | Status: DC

## 2014-11-05 MED ORDER — QUINAPRIL HCL 40 MG PO TABS: 40.0000 mg | ORAL_TABLET | Freq: Every day | ORAL | Status: DC

## 2014-12-15 ENCOUNTER — Ambulatory Visit (HOSPITAL_COMMUNITY)
Admission: RE | Admit: 2014-12-15 | Discharge: 2014-12-15 | Disposition: A | Discharge status: Home / Self Care | Payer: Medicare Other | Source: Ambulatory Visit | Attending: Specialist | Admitting: Specialist

## 2014-12-15 ENCOUNTER — Other Ambulatory Visit (HOSPITAL_COMMUNITY): Payer: Self-pay | Admitting: Orthopedic Surgery

## 2014-12-15 DIAGNOSIS — M7989 Other specified soft tissue disorders: Secondary | ICD-10-CM

## 2014-12-15 DIAGNOSIS — Z96652 Presence of left artificial knee joint: Secondary | ICD-10-CM | POA: Diagnosis not present

## 2014-12-15 DIAGNOSIS — M79662 Pain in left lower leg: Secondary | ICD-10-CM

## 2014-12-15 NOTE — Progress Notes (Signed)
VASCULAR LAB PRELIMINARY  PRELIMINARY  PRELIMINARY  PRELIMINARY  Left lower extremity venous duplex completed.    Preliminary report:  Left:  No evidence of DVT, superficial thrombosis, or Baker's cyst.  Ivey Nembhard, RVS5/16/2016, 5:07 PM

## 2015-04-13 ENCOUNTER — Encounter: Payer: Self-pay | Admitting: Cardiology

## 2015-04-13 ENCOUNTER — Ambulatory Visit (INDEPENDENT_AMBULATORY_CARE_PROVIDER_SITE_OTHER): Payer: Medicare Other | Admitting: Cardiology

## 2015-04-13 VITALS — BP 150/86 | HR 82 | Ht 73.0 in | Wt 190.0 lb

## 2015-04-13 DIAGNOSIS — I10 Essential (primary) hypertension: Secondary | ICD-10-CM | POA: Diagnosis not present

## 2015-04-13 DIAGNOSIS — E785 Hyperlipidemia, unspecified: Secondary | ICD-10-CM | POA: Diagnosis not present

## 2015-04-13 DIAGNOSIS — I2510 Atherosclerotic heart disease of native coronary artery without angina pectoris: Secondary | ICD-10-CM

## 2015-04-13 NOTE — Progress Notes (Signed)
Cardiology Office Note   Date:  04/13/2015   ID:  John Gamble, DOB June 22, 1931, MRN [id number]  PCP:  Teressa Lower, MD  Cardiologist:  Mahayla Haddaway [name], MD   Chief Complaint  Patient presents with  . Coronary Artery Disease      History of Present Illness: John Gamble is a 81 y.o. male who presents for follow up CAD.He has a history of coronary disease and is status post stenting of the left circumflex coronary in 2000. He had a normal stress Myoview in July of 2011. He also has a history of hypertension and hyperlipidemia. He is doing very well from a cardiac standpoint without chest pain or SOB. He goes to the gym 4-5 days a week and notes no limitation. He does complain of some low back pain.     Past Medical History  Diagnosis Date  . Coronary artery disease   . MI, old   . Hypertension   . OA (osteoarthritis)   . Hyperlipidemia   . Pneumonia     hx of 2013     Past Surgical History  Procedure Laterality Date  . Cardiac catheterization  01/26/1999    EF 55%  . Coronary stent placement  2000    LEFT CIRCUMFLEX CORONARY  . Replacement total knee    . Inguinal hernia repair    . Cardiovascular stress test  02/22/2010    EF 61%  . Corneal implant      left  . Total knee arthroplasty Left 09/19/2014    Procedure: LEFT TOTAL KNEE ARTHROPLASTY;  Surgeon: Sydnee Cabal, MD;  Location: [facility];  Service: Orthopedics;  Laterality: Left;     Current Outpatient Prescriptions  Medication Sig Dispense Refill  . acyclovir (ZOVIRAX) 800 MG tablet Take 800 mg by mouth daily.     Marland Kitchen aspirin 81 MG tablet Take 81 mg by mouth daily.    Marland Kitchen atorvastatin (LIPITOR) 40 MG tablet Take 40 mg by mouth daily at 6 PM.     . Glucosamine-Chondroitin (OSTEO BI-FLEX REGULAR STRENGTH PO) Take 2 tablets by mouth 2 (two) times daily.     . hydrochlorothiazide (HYDRODIURIL) 25 MG tablet Take 0.5 tablets (12.5 mg total) by mouth daily. 30 tablet 6  . LOTEMAX 0.5 % GEL     . Multiple Vitamin  (MULTIVITAMIN WITH MINERALS) TABS tablet Take 1 tablet by mouth daily.    . Omega-3 Fatty Acids (FISH OIL) 1200 MG CAPS Take 1,200 mg by mouth daily.    Marland Kitchen oxyCODONE-acetaminophen (ROXICET) 5-325 MG per tablet Take 1-2 tablets by mouth every 4 (four) hours as needed for moderate pain or severe pain. 60 tablet 0  . Polyethylene Glycol 3350 (MIRALAX PO) Take 17 g by mouth daily.     . quinapril (ACCUPRIL) 40 MG tablet Take 1 tablet (40 mg total) by mouth at bedtime. 30 tablet 5  . TRAVATAN Z 0.004 % SOLN ophthalmic solution Place 1 drop into the left eye at bedtime.     . Zinc 50 MG CAPS Take 50 mg by mouth daily.     No current facility-administered medications for this visit.    Allergies:   Oysters and Sulfa antibiotics    Social History:  The patient  reports that he quit smoking about 26 years ago. He has never used smokeless tobacco. He reports that he does not drink alcohol or use illicit drugs.   Family History:  The patient's family history includes Cancer in his father and mother.  ROS:  Please see the history of present illness.   Otherwise, review of systems are positive for none.   All other systems are reviewed and negative.    PHYSICAL EXAM: VS:  BP 150/86 mmHg  Pulse 82  Ht 6\' 1"  (1.854 m)  Wt 86.183 kg (190 lb)  BMI 25.07 kg/m2 , BMI Body mass index is 25.07 kg/(m^2). GEN: Well nourished, well developed, in no acute distress HEENT: normal Neck: no JVD, carotid bruits, or masses Cardiac: RRR; no murmurs, rubs, or gallops,no edema  Respiratory:  clear to auscultation bilaterally, normal work of breathing GI: soft, nontender, nondistended, + BS MS: no deformity or atrophy, surgical incision left knee healing well.  Skin: warm and dry, no rash Neuro:  Strength and sensation are intact Psych: euthymic mood, full affect   EKG:  EKG is not ordered today.    Recent Labs: 09/20/2014: BUN 17; Creatinine, Ser 1.00; Potassium 4.4; Sodium 134* 09/22/2014: Hemoglobin  10.1*; Platelets 205    Lipid Panel    Component Value Date/Time   CHOL 136 06/24/2013 0908   TRIG 106.0 06/24/2013 0908   HDL 38.80* 06/24/2013 0908   CHOLHDL 4 06/24/2013 0908   VLDL 21.2 06/24/2013 0908   LDLCALC 76 06/24/2013 0908      Wt Readings from Last 3 Encounters:  04/13/15 86.183 kg (190 lb)  10/08/14 81.874 kg (180 lb 8 oz)  09/19/14 86.183 kg (190 lb)      Other studies Reviewed: Additional studies/ records that were reviewed today include: labs from primary care in August 2016. Cholesterol 107, triglycerides 110, HDL 31, LDL 54. A1c 5.5%.    ASSESSMENT AND PLAN:  1. Coronary disease status post remote stenting of the left circumflex and 2000 with a bare-metal stent. His last nuclear stress test was in July of 2011 and was normal. He remains asymptomatic. We'll continue with his medical therapy. I have recommended a follow up stress myoview since it has been 5 years since his last evaluation.  2. Hypertension, controlled.  3. Hyperlipidemia excellent control. Now on atorvastatin.   Current medicines are reviewed at length with the patient today.  The patient does not have concerns regarding medicines.  The following changes have been made:  no change  Labs/ tests ordered today include: none   Orders Placed This Encounter  Procedures  . Myocardial Perfusion Imaging     Disposition:   FU with me in 6 months   Signed, Shawnice Tilmon [name], MD  04/13/2015 10:48 AM    [facility] [place], [place], [place]  [address] Phone: [phone]; Fax: [phone]

## 2015-04-13 NOTE — Patient Instructions (Addendum)
Continue your current therapy  We will schedule you for a nuclear stress test  I will see you in 6 months 

## 2015-04-14 ENCOUNTER — Telehealth (HOSPITAL_COMMUNITY): Payer: Self-pay

## 2015-04-14 NOTE — Telephone Encounter (Signed)
Encounter complete. 

## 2015-04-16 ENCOUNTER — Ambulatory Visit (HOSPITAL_COMMUNITY)
Admission: RE | Admit: 2015-04-16 | Discharge: 2015-04-16 | Disposition: A | Discharge status: Home / Self Care | Payer: Medicare Other | Source: Ambulatory Visit | Attending: Cardiology | Admitting: Cardiology

## 2015-04-16 DIAGNOSIS — I10 Essential (primary) hypertension: Secondary | ICD-10-CM | POA: Insufficient documentation

## 2015-04-16 DIAGNOSIS — E785 Hyperlipidemia, unspecified: Secondary | ICD-10-CM | POA: Insufficient documentation

## 2015-04-16 DIAGNOSIS — I2510 Atherosclerotic heart disease of native coronary artery without angina pectoris: Secondary | ICD-10-CM | POA: Insufficient documentation

## 2015-04-16 MED ORDER — TECHNETIUM TC 99M SESTAMIBI GENERIC - CARDIOLITE
10.2000 | Freq: Once | INTRAVENOUS | Status: AC | PRN
  Administered 2015-04-16: 10.2 via INTRAVENOUS

## 2015-04-16 MED ORDER — TECHNETIUM TC 99M SESTAMIBI GENERIC - CARDIOLITE
30.9000 | Freq: Once | INTRAVENOUS | Status: AC | PRN
  Administered 2015-04-16: 30.9 via INTRAVENOUS

## 2015-04-17 ENCOUNTER — Encounter: Payer: Self-pay | Admitting: Cardiology

## 2015-04-18 LAB — MYOCARDIAL PERFUSION IMAGING
CHL CUP MPHR: 138 {beats}/min
CHL CUP RESTING HR STRESS: 60 {beats}/min
CHL RATE OF PERCEIVED EXERTION: 16
CSEPED: 4 min
CSEPEDS: 34 s
CSEPEW: 6.4 METS
CSEPPHR: 131 {beats}/min
LVDIAVOL: 122 mL
LVSYSVOL: 61 mL
NUC STRESS TID: 1.18
Percent HR: 94 %
SDS: 4
SRS: 1
SSS: 5

## 2015-04-28 ENCOUNTER — Other Ambulatory Visit: Payer: Self-pay

## 2015-04-28 DIAGNOSIS — R9439 Abnormal result of other cardiovascular function study: Secondary | ICD-10-CM

## 2015-04-30 ENCOUNTER — Ambulatory Visit
Admission: RE | Admit: 2015-04-30 | Discharge: 2015-04-30 | Disposition: A | Discharge status: Home / Self Care | Payer: Medicare Other | Source: Ambulatory Visit | Attending: Cardiology | Admitting: Cardiology

## 2015-04-30 ENCOUNTER — Other Ambulatory Visit: Payer: Self-pay | Admitting: Cardiology

## 2015-04-30 DIAGNOSIS — R9439 Abnormal result of other cardiovascular function study: Secondary | ICD-10-CM

## 2015-04-30 LAB — BASIC METABOLIC PANEL
BUN: 18 mg/dL (ref 7–25)
CHLORIDE: 99 mmol/L (ref 98–110)
CO2: 30 mmol/L (ref 20–31)
Calcium: 9.9 mg/dL (ref 8.6–10.3)
Creat: 0.93 mg/dL (ref 0.70–1.11)
GLUCOSE: 136 mg/dL — AB (ref 65–99)
POTASSIUM: 4.9 mmol/L (ref 3.5–5.3)
SODIUM: 135 mmol/L (ref 135–146)

## 2015-05-01 LAB — CBC WITH DIFFERENTIAL/PLATELET
BASOS ABS: 0.1 10*3/uL (ref 0.0–0.1)
BASOS PCT: 1 % (ref 0–1)
EOS ABS: 0.1 10*3/uL (ref 0.0–0.7)
EOS PCT: 1 % (ref 0–5)
HEMATOCRIT: 48.4 % (ref 39.0–52.0)
Hemoglobin: 16.4 g/dL (ref 13.0–17.0)
LYMPHS PCT: 29 % (ref 12–46)
Lymphs Abs: 2.6 10*3/uL (ref 0.7–4.0)
MCH: 32.1 pg (ref 26.0–34.0)
MCHC: 33.9 g/dL (ref 30.0–36.0)
MCV: 94.7 fL (ref 78.0–100.0)
MONO ABS: 0.5 10*3/uL (ref 0.1–1.0)
MPV: 9.4 fL (ref 8.6–12.4)
Monocytes Relative: 6 % (ref 3–12)
Neutro Abs: 5.5 10*3/uL (ref 1.7–7.7)
Neutrophils Relative %: 63 % (ref 43–77)
PLATELETS: 279 10*3/uL (ref 150–400)
RBC: 5.11 MIL/uL (ref 4.22–5.81)
RDW: 13.3 % (ref 11.5–15.5)
WBC: 8.8 10*3/uL (ref 4.0–10.5)

## 2015-05-01 LAB — PROTIME-INR
INR: 1.02 (ref <1.50)
PROTHROMBIN TIME: 13.5 s (ref 11.6–15.2)

## 2015-05-06 ENCOUNTER — Ambulatory Visit (HOSPITAL_COMMUNITY)
Admission: RE | Admit: 2015-05-06 | Discharge: 2015-05-06 | Disposition: A | Discharge status: Home / Self Care | Payer: Medicare Other | Source: Ambulatory Visit | Attending: Cardiology | Admitting: Cardiology

## 2015-05-06 ENCOUNTER — Other Ambulatory Visit: Payer: Self-pay | Admitting: Cardiology

## 2015-05-06 ENCOUNTER — Encounter (HOSPITAL_COMMUNITY)
Admission: RE | Disposition: A | Discharge status: Home / Self Care | Payer: Self-pay | Source: Ambulatory Visit | Attending: Cardiology

## 2015-05-06 DIAGNOSIS — M1990 Unspecified osteoarthritis, unspecified site: Secondary | ICD-10-CM | POA: Diagnosis not present

## 2015-05-06 DIAGNOSIS — Z882 Allergy status to sulfonamides status: Secondary | ICD-10-CM | POA: Insufficient documentation

## 2015-05-06 DIAGNOSIS — I2510 Atherosclerotic heart disease of native coronary artery without angina pectoris: Secondary | ICD-10-CM | POA: Insufficient documentation

## 2015-05-06 DIAGNOSIS — I10 Essential (primary) hypertension: Secondary | ICD-10-CM | POA: Diagnosis not present

## 2015-05-06 DIAGNOSIS — M545 Low back pain: Secondary | ICD-10-CM | POA: Diagnosis not present

## 2015-05-06 DIAGNOSIS — I252 Old myocardial infarction: Secondary | ICD-10-CM | POA: Insufficient documentation

## 2015-05-06 DIAGNOSIS — Z7982 Long term (current) use of aspirin: Secondary | ICD-10-CM | POA: Insufficient documentation

## 2015-05-06 DIAGNOSIS — E785 Hyperlipidemia, unspecified: Secondary | ICD-10-CM | POA: Insufficient documentation

## 2015-05-06 DIAGNOSIS — Z87891 Personal history of nicotine dependence: Secondary | ICD-10-CM | POA: Diagnosis not present

## 2015-05-06 DIAGNOSIS — R9439 Abnormal result of other cardiovascular function study: Secondary | ICD-10-CM | POA: Diagnosis present

## 2015-05-06 DIAGNOSIS — Z955 Presence of coronary angioplasty implant and graft: Secondary | ICD-10-CM | POA: Insufficient documentation

## 2015-05-06 HISTORY — PX: CARDIAC CATHETERIZATION: SHX172

## 2015-05-06 SURGERY — LEFT HEART CATH AND CORONARY ANGIOGRAPHY

## 2015-05-06 MED ORDER — VERAPAMIL HCL 2.5 MG/ML IV SOLN
INTRAVENOUS | Status: DC | PRN
  Administered 2015-05-06: 12:00:00 via INTRA_ARTERIAL

## 2015-05-06 MED ORDER — ASPIRIN 81 MG PO CHEW: 81.0000 mg | CHEWABLE_TABLET | ORAL | Status: DC

## 2015-05-06 MED ORDER — FENTANYL CITRATE (PF) 100 MCG/2ML IJ SOLN
INTRAMUSCULAR | Status: DC | PRN
  Administered 2015-05-06: 25 ug via INTRAVENOUS

## 2015-05-06 MED ORDER — SODIUM CHLORIDE 0.9 % IJ SOLN: 3.0000 mL | INTRAMUSCULAR | Status: DC | PRN

## 2015-05-06 MED ORDER — HEPARIN SODIUM (PORCINE) 1000 UNIT/ML IJ SOLN
INTRAMUSCULAR | Status: DC | PRN
  Administered 2015-05-06: 4500 [IU] via INTRAVENOUS

## 2015-05-06 MED ORDER — IOHEXOL 350 MG/ML SOLN
INTRAVENOUS | Status: DC | PRN
  Administered 2015-05-06: 75 mL via INTRA_ARTERIAL

## 2015-05-06 MED ORDER — SODIUM CHLORIDE 0.9 % IJ SOLN: 3.0000 mL | Freq: Two times a day (BID) | INTRAMUSCULAR | Status: DC

## 2015-05-06 MED ORDER — LIDOCAINE HCL (PF) 1 % IJ SOLN
INTRAMUSCULAR | Status: AC
  Filled 2015-05-06: qty 30

## 2015-05-06 MED ORDER — FENTANYL CITRATE (PF) 100 MCG/2ML IJ SOLN
INTRAMUSCULAR | Status: AC
  Filled 2015-05-06: qty 4

## 2015-05-06 MED ORDER — SODIUM CHLORIDE 0.9 % WEIGHT BASED INFUSION: 1.0000 mL/kg/h | INTRAVENOUS | Status: DC

## 2015-05-06 MED ORDER — METOPROLOL SUCCINATE ER 50 MG PO TB24
50.0000 mg | ORAL_TABLET | Freq: Every day | ORAL | Status: DC
Start: 2015-05-06 — End: 2015-05-08

## 2015-05-06 MED ORDER — SODIUM CHLORIDE 0.9 % WEIGHT BASED INFUSION: 3.0000 mL/kg/h | INTRAVENOUS | Status: DC

## 2015-05-06 MED ORDER — MIDAZOLAM HCL 2 MG/2ML IJ SOLN
INTRAMUSCULAR | Status: AC
  Filled 2015-05-06: qty 4

## 2015-05-06 MED ORDER — SODIUM CHLORIDE 0.9 % IV SOLN: 250.0000 mL | INTRAVENOUS | Status: DC | PRN

## 2015-05-06 MED ORDER — VERAPAMIL HCL 2.5 MG/ML IV SOLN
INTRAVENOUS | Status: AC
  Filled 2015-05-06: qty 2

## 2015-05-06 MED ORDER — HEPARIN (PORCINE) IN NACL 2-0.9 UNIT/ML-% IJ SOLN
INTRAMUSCULAR | Status: AC
  Filled 2015-05-06: qty 1000

## 2015-05-06 MED ORDER — NITROGLYCERIN 1 MG/10 ML FOR IR/CATH LAB
INTRA_ARTERIAL | Status: AC
  Filled 2015-05-06: qty 10

## 2015-05-06 MED ORDER — HEPARIN SODIUM (PORCINE) 1000 UNIT/ML IJ SOLN
INTRAMUSCULAR | Status: AC
  Filled 2015-05-06: qty 1

## 2015-05-06 MED ORDER — HEPARIN (PORCINE) IN NACL 2-0.9 UNIT/ML-% IJ SOLN
INTRAMUSCULAR | Status: DC | PRN
  Administered 2015-05-06: 12:00:00

## 2015-05-06 MED ORDER — SODIUM CHLORIDE 0.9 % WEIGHT BASED INFUSION
3.0000 mL/kg/h | INTRAVENOUS | Status: DC
  Administered 2015-05-06: 3 mL/kg/h via INTRAVENOUS

## 2015-05-06 MED ORDER — MIDAZOLAM HCL 2 MG/2ML IJ SOLN
INTRAMUSCULAR | Status: DC | PRN
  Administered 2015-05-06: 1 mg via INTRAVENOUS

## 2015-05-06 SURGICAL SUPPLY — 12 items
CATH INFINITI 5 FR JL3.5 (CATHETERS) ×3 IMPLANT
CATH INFINITI 5FR ANG PIGTAIL (CATHETERS) ×3 IMPLANT
CATH INFINITI JR4 5F (CATHETERS) ×3 IMPLANT
DEVICE RAD COMP TR BAND LRG (VASCULAR PRODUCTS) ×3 IMPLANT
GLIDESHEATH SLEND SS 6F .021 (SHEATH) ×3 IMPLANT
KIT HEART LEFT (KITS) ×3 IMPLANT
PACK CARDIAC CATHETERIZATION (CUSTOM PROCEDURE TRAY) ×3 IMPLANT
SYR MEDRAD MARK V 150ML (SYRINGE) ×3 IMPLANT
TRANSDUCER W/STOPCOCK (MISCELLANEOUS) ×3 IMPLANT
TUBING CIL FLEX 10 FLL-RA (TUBING) ×3 IMPLANT
WIRE HI TORQ VERSACORE-J 145CM (WIRE) ×3 IMPLANT
WIRE SAFE-T 1.5MM-J .035X260CM (WIRE) ×3 IMPLANT

## 2015-05-06 NOTE — H&P (View-Only) (Signed)
Cardiology Office Note   Date:  04/13/2015   ID:  John Gamble, DOB 06/20/1931, MRN [id number]  PCP:  Teressa Lower, MD  Cardiologist:  Shemeika Starzyk [name], MD   Chief Complaint  Patient presents with  . Coronary Artery Disease      History of Present Illness: John Gamble is a 81 y.o. male who presents for follow up CAD.He has a history of coronary disease and is status post stenting of the left circumflex coronary in 2000. He had a normal stress Myoview in July of 2011. He also has a history of hypertension and hyperlipidemia. He is doing very well from a cardiac standpoint without chest pain or SOB. He goes to the gym 4-5 days a week and notes no limitation. He does complain of some low back pain.     Past Medical History  Diagnosis Date  . Coronary artery disease   . MI, old   . Hypertension   . OA (osteoarthritis)   . Hyperlipidemia   . Pneumonia     hx of 2013     Past Surgical History  Procedure Laterality Date  . Cardiac catheterization  01/26/1999    EF 55%  . Coronary stent placement  2000    LEFT CIRCUMFLEX CORONARY  . Replacement total knee    . Inguinal hernia repair    . Cardiovascular stress test  02/22/2010    EF 61%  . Corneal implant      left  . Total knee arthroplasty Left 09/19/2014    Procedure: LEFT TOTAL KNEE ARTHROPLASTY;  Surgeon: Sydnee Cabal, MD;  Location: [facility];  Service: Orthopedics;  Laterality: Left;     Current Outpatient Prescriptions  Medication Sig Dispense Refill  . acyclovir (ZOVIRAX) 800 MG tablet Take 800 mg by mouth daily.     Marland Kitchen aspirin 81 MG tablet Take 81 mg by mouth daily.    Marland Kitchen atorvastatin (LIPITOR) 40 MG tablet Take 40 mg by mouth daily at 6 PM.     . Glucosamine-Chondroitin (OSTEO BI-FLEX REGULAR STRENGTH PO) Take 2 tablets by mouth 2 (two) times daily.     . hydrochlorothiazide (HYDRODIURIL) 25 MG tablet Take 0.5 tablets (12.5 mg total) by mouth daily. 30 tablet 6  . LOTEMAX 0.5 % GEL     . Multiple Vitamin  (MULTIVITAMIN WITH MINERALS) TABS tablet Take 1 tablet by mouth daily.    . Omega-3 Fatty Acids (FISH OIL) 1200 MG CAPS Take 1,200 mg by mouth daily.    Marland Kitchen oxyCODONE-acetaminophen (ROXICET) 5-325 MG per tablet Take 1-2 tablets by mouth every 4 (four) hours as needed for moderate pain or severe pain. 60 tablet 0  . Polyethylene Glycol 3350 (MIRALAX PO) Take 17 g by mouth daily.     . quinapril (ACCUPRIL) 40 MG tablet Take 1 tablet (40 mg total) by mouth at bedtime. 30 tablet 5  . TRAVATAN Z 0.004 % SOLN ophthalmic solution Place 1 drop into the left eye at bedtime.     . Zinc 50 MG CAPS Take 50 mg by mouth daily.     No current facility-administered medications for this visit.    Allergies:   Oysters and Sulfa antibiotics    Social History:  The patient  reports that he quit smoking about 26 years ago. He has never used smokeless tobacco. He reports that he does not drink alcohol or use illicit drugs.   Family History:  The patient's family history includes Cancer in his father and mother.  ROS:  Please see the history of present illness.   Otherwise, review of systems are positive for none.   All other systems are reviewed and negative.    PHYSICAL EXAM: VS:  BP 150/86 mmHg  Pulse 82  Ht 6\' 1"  (1.854 m)  Wt 86.183 kg (190 lb)  BMI 25.07 kg/m2 , BMI Body mass index is 25.07 kg/(m^2). GEN: Well nourished, well developed, in no acute distress HEENT: normal Neck: no JVD, carotid bruits, or masses Cardiac: RRR; no murmurs, rubs, or gallops,no edema  Respiratory:  clear to auscultation bilaterally, normal work of breathing GI: soft, nontender, nondistended, + BS MS: no deformity or atrophy, surgical incision left knee healing well.  Skin: warm and dry, no rash Neuro:  Strength and sensation are intact Psych: euthymic mood, full affect   EKG:  EKG is not ordered today.    Recent Labs: 09/20/2014: BUN 17; Creatinine, Ser 1.00; Potassium 4.4; Sodium 134* 09/22/2014: Hemoglobin  10.1*; Platelets 205    Lipid Panel    Component Value Date/Time   CHOL 136 06/24/2013 0908   TRIG 106.0 06/24/2013 0908   HDL 38.80* 06/24/2013 0908   CHOLHDL 4 06/24/2013 0908   VLDL 21.2 06/24/2013 0908   LDLCALC 76 06/24/2013 0908      Wt Readings from Last 3 Encounters:  04/13/15 86.183 kg (190 lb)  10/08/14 81.874 kg (180 lb 8 oz)  09/19/14 86.183 kg (190 lb)      Other studies Reviewed: Additional studies/ records that were reviewed today include: labs from primary care in August 2016. Cholesterol 107, triglycerides 110, HDL 31, LDL 54. A1c 5.5%.    ASSESSMENT AND PLAN:  1. Coronary disease status post remote stenting of the left circumflex and 2000 with a bare-metal stent. His last nuclear stress test was in July of 2011 and was normal. He remains asymptomatic. We'll continue with his medical therapy. I have recommended a follow up stress myoview since it has been 5 years since his last evaluation.  2. Hypertension, controlled.  3. Hyperlipidemia excellent control. Now on atorvastatin.   Current medicines are reviewed at length with the patient today.  The patient does not have concerns regarding medicines.  The following changes have been made:  no change  Labs/ tests ordered today include: none   Orders Placed This Encounter  Procedures  . Myocardial Perfusion Imaging     Disposition:   FU with me in 6 months   Signed, Lachell Rochette [name], MD  04/13/2015 10:48 AM    [facility] [place], [place], [place]  [address] Phone: [phone]; Fax: [phone]

## 2015-05-06 NOTE — Discharge Instructions (Addendum)
Start metoprolol (Toprol XL) 50 mg daily.   Radial Site Care Refer to this sheet in the next few weeks. These instructions provide you with information about caring for yourself after your procedure. Your health care provider may also give you more specific instructions. Your treatment has been planned according to current medical practices, but problems sometimes occur. Call your health care provider if you have any problems or questions after your procedure. WHAT TO EXPECT AFTER THE PROCEDURE After your procedure, it is typical to have the following:  Bruising at the radial site that usually fades within 1-2 weeks.  Blood collecting in the tissue (hematoma) that may be painful to the touch. It should usually decrease in size and tenderness within 1-2 weeks. HOME CARE INSTRUCTIONS  Take medicines only as directed by your health care provider.  You may shower 24-48 hours after the procedure or as directed by your health care provider. Remove the bandage (dressing) and gently wash the site with plain soap and water. Pat the area dry with a clean towel. Do not rub the site, because this may cause bleeding.  Do not take baths, swim, or use a hot tub until your health care provider approves.  Check your insertion site every day for redness, swelling, or drainage.  Do not apply powder or lotion to the site.  Do not flex or bend the affected arm for 24 hours or as directed by your health care provider.  Do not push or pull heavy objects with the affected arm for 24 hours or as directed by your health care provider.  Do not lift over 10 lb (4.5 kg) for 5 days after your procedure or as directed by your health care provider.  Ask your health care provider when it is okay to:  Return to work or school.  Resume usual physical activities or sports.  Resume sexual activity.  Do not drive home if you are discharged the same day as the procedure. Have someone else drive you.  You may drive  24 hours after the procedure unless otherwise instructed by your health care provider.  Do not operate machinery or power tools for 24 hours after the procedure.  If your procedure was done as an outpatient procedure, which means that you went home the same day as your procedure, a responsible adult should be with you for the first 24 hours after you arrive home.  Keep all follow-up visits as directed by your health care provider. This is important. SEEK MEDICAL CARE IF:  You have a fever.  You have chills.  You have increased bleeding from the radial site. Hold pressure on the site and call 911. SEEK IMMEDIATE MEDICAL CARE IF:  You have unusual pain at the radial site.  You have redness, warmth, or swelling at the radial site.  You have drainage (other than a small amount of blood on the dressing) from the radial site.  The radial site is bleeding, and the bleeding does not stop after 30 minutes of holding steady pressure on the site.  Your arm or hand becomes pale, cool, tingly, or numb.   This information is not intended to replace advice given to you by your health care provider. Make sure you discuss any questions you have with your health care provider.   Document Released: 08/20/2010 Document Revised: 08/08/2014 Document Reviewed: 02/03/2014 Elsevier Interactive Patient Education [employer].

## 2015-05-06 NOTE — Interval H&P Note (Signed)
History and Physical Interval Note:  05/06/2015 11:20 AM  John Gamble  has presented today for surgery, with the diagnosis of adnormal mioview  The various methods of treatment have been discussed with the patient and family. After consideration of risks, benefits and other options for treatment, the patient has consented to  Procedure(s): Left Heart Cath and Coronary Angiography (N/A) as a surgical intervention .  The patient's history has been reviewed, patient examined, no change in status, stable for surgery.  I have reviewed the patient's chart and labs.  Questions were answered to the patient's satisfaction.    Cath Lab Visit (complete for each Cath Lab visit)  Clinical Evaluation Leading to the Procedure:   ACS: No.  Non-ACS:    Anginal Classification: CCS I  Anti-ischemic medical therapy: No Therapy  Non-Invasive Test Results: Intermediate-risk stress test findings: cardiac mortality 1-3%/year  Prior CABG: No previous CABG       [name] [facility] 05/06/2015 11:20 AM

## 2015-05-07 ENCOUNTER — Encounter (HOSPITAL_COMMUNITY): Payer: Self-pay | Admitting: Cardiology

## 2015-05-08 ENCOUNTER — Telehealth: Payer: Self-pay | Admitting: Cardiology

## 2015-05-08 ENCOUNTER — Other Ambulatory Visit: Payer: Self-pay

## 2015-05-08 MED ORDER — QUINAPRIL HCL 40 MG PO TABS: 40.0000 mg | ORAL_TABLET | Freq: Every day | ORAL | Status: DC

## 2015-05-08 MED ORDER — METOPROLOL SUCCINATE ER 50 MG PO TB24: 50.0000 mg | ORAL_TABLET | Freq: Every day | ORAL | Status: DC

## 2015-05-08 NOTE — Telephone Encounter (Signed)
Called back - pt did not know name of medication newly prescribed. Reviewed this, metoprolol was only medication started on 10/5 - was submitted as a printed rx, not electronic.  He did not receive written Rx in hospital.  Submitted refill electronically to his preferred pharmacy. All questions r/t med addressed.

## 2015-05-08 NOTE — Telephone Encounter (Signed)
Pt had a cath on Wednesday and Dr [name] was supposed to have called in a new medicine. She check the pharmacy,nothing have been called in.

## 2015-05-27 ENCOUNTER — Ambulatory Visit (INDEPENDENT_AMBULATORY_CARE_PROVIDER_SITE_OTHER): Payer: Medicare Other | Admitting: Nurse Practitioner

## 2015-05-27 ENCOUNTER — Encounter: Payer: Self-pay | Admitting: Nurse Practitioner

## 2015-05-27 VITALS — BP 140/80 | HR 50 | Ht 73.0 in | Wt 194.0 lb

## 2015-05-27 DIAGNOSIS — E785 Hyperlipidemia, unspecified: Secondary | ICD-10-CM

## 2015-05-27 DIAGNOSIS — Z9889 Other specified postprocedural states: Secondary | ICD-10-CM

## 2015-05-27 DIAGNOSIS — I2510 Atherosclerotic heart disease of native coronary artery without angina pectoris: Secondary | ICD-10-CM | POA: Diagnosis not present

## 2015-05-27 DIAGNOSIS — I10 Essential (primary) hypertension: Secondary | ICD-10-CM

## 2015-05-27 NOTE — Patient Instructions (Addendum)
We will be checking the following labs today - NONE  Medication Instructions:    Continue with your current medicines.     Testing/Procedures To Be Arranged:  N/A  Follow-Up:   See Dr. [name] in 3 months     Other Special Instructions:   Stay active and keep up your exercise routine  Call Dr. [name] if you have any symptoms of chest pain    If you need a refill on your cardiac medications before your next appointment, please call your pharmacy.   Call the [place] office at [phone] if you have any questions, problems or concerns.

## 2015-05-27 NOTE — Progress Notes (Signed)
CARDIOLOGY OFFICE NOTE  Date:  05/27/2015    John Gamble Date of Birth: 02/05/1931 Medical Record #[id number]  PCP:  Teressa Lower, MD  Cardiologist:  [name]    Chief Complaint  Patient presents with  . FU post cardiac catheterization    Seen for Dr. [name]    History of Present Illness: John Gamble is a 81 y.o. male who presents today for a post cardiac cath visit. Seen for Dr. [name]. He has known CAD with stenting of the left circumflex coronary in 2000. He had a normal stress Myoview in July of 2011. He also has a history of hypertension and hyperlipidemia.   Seen back in September and was felt to be doing ok. Myoview updated - this was abnormal. Referred for cardiac cath - see below - to manage medically.  Comes back today. Here with his wife. Doing ok. No chest pain. Not short of breath. Cath site ok. Asking about his findings and those were explained in detail. He exercises at the gym 5 days a week. No symptoms whatsoever. BP better at home. He knows he could do a better job with his weight/diet. He is ok with staying on Lipitor for now.   Past Medical History  Diagnosis Date  . Coronary artery disease   . MI, old   . Hypertension   . OA (osteoarthritis)   . Hyperlipidemia   . Pneumonia     hx of 2013     Past Surgical History  Procedure Laterality Date  . Cardiac catheterization  01/26/1999    EF 55%  . Coronary stent placement  2000    LEFT CIRCUMFLEX CORONARY  . Replacement total knee    . Inguinal hernia repair    . Cardiovascular stress test  02/22/2010    EF 61%  . Corneal implant      left  . Total knee arthroplasty Left 09/19/2014    Procedure: LEFT TOTAL KNEE ARTHROPLASTY;  Surgeon: Sydnee Cabal, MD;  Location: [facility];  Service: Orthopedics;  Laterality: Left;  . Cardiac catheterization N/A 05/06/2015    Procedure: Left Heart Cath and Coronary Angiography;  Surgeon: Peter M [name], MD;  Location: [facility];  Service: Cardiovascular;   Laterality: N/A;     Medications: Current Outpatient Prescriptions  Medication Sig Dispense Refill  . acyclovir (ZOVIRAX) 800 MG tablet Take 800 mg by mouth daily.     Marland Kitchen aspirin EC 81 MG tablet Take 81 mg by mouth daily before supper.    Marland Kitchen atorvastatin (LIPITOR) 40 MG tablet Take 40 mg by mouth daily with supper.     . Glucosamine-Chondroitin (OSTEO BI-FLEX REGULAR STRENGTH PO) Take 2 tablets by mouth 2 (two) times daily.     . hydrochlorothiazide (HYDRODIURIL) 25 MG tablet Take 0.5 tablets (12.5 mg total) by mouth daily. 30 tablet 6  . Loteprednol Etabonate (LOTEMAX) 0.5 % GEL Place 1 drop into the left eye daily.    . metoprolol succinate (TOPROL XL) 50 MG 24 hr tablet Take 1 tablet (50 mg total) by mouth daily. Take with or immediately following a meal. 90 tablet 3  . Multiple Vitamin (MULTIVITAMIN WITH MINERALS) TABS tablet Take 1 tablet by mouth daily. Centrum Silver    . Omega-3 Fatty Acids (FISH OIL) 1200 MG CAPS Take 1,200 mg by mouth daily.    . polyethylene glycol (MIRALAX / GLYCOLAX) packet Take 17 g by mouth daily. Mix with 8 oz liquid and drink    .  quinapril (ACCUPRIL) 40 MG tablet Take 1 tablet (40 mg total) by mouth at bedtime. 30 tablet 5  . Travoprost, BAK Free, (TRAVATAN) 0.004 % SOLN ophthalmic solution Place 1 drop into the left eye at bedtime.    . Zinc 50 MG CAPS Take 50 mg by mouth daily with supper.      No current facility-administered medications for this visit.    Allergies: Allergies  Allergen Reactions  . Oysters [Shellfish Allergy] Nausea Only  . Sulfa Antibiotics Nausea And Vomiting    Social History: The patient  reports that he quit smoking about 26 years ago. He has never used smokeless tobacco. He reports that he does not drink alcohol or use illicit drugs.   Family History: The patient's family history includes Cancer in his father and mother.   Review of Systems: Please see the history of present illness.   Otherwise, the review of systems  is positive for none.   All other systems are reviewed and negative.   Physical Exam: VS:  BP 140/80 mmHg  Pulse 50  Ht 6\' 1"  (1.854 m)  Wt 194 lb (87.998 kg)  BMI 25.60 kg/m2  SpO2 98% .  BMI Body mass index is 25.6 kg/(m^2).  Wt Readings from Last 3 Encounters:  05/27/15 194 lb (87.998 kg)  05/06/15 188 lb (85.276 kg)  04/16/15 190 lb (86.183 kg)    General: Pleasant. Well developed, well nourished and in no acute distress.  HEENT: Normal. Neck: Supple, no JVD, carotid bruits, or masses noted.  Cardiac: Regular rate and rhythm. No murmurs, rubs, or gallops. No edema.  Respiratory:  Lungs are clear to auscultation bilaterally with normal work of breathing.  GI: Soft and nontender.  MS: No deformity or atrophy. Gait and ROM intact. Skin: Warm and dry. Color is normal.  Neuro:  Strength and sensation are intact and no gross focal deficits noted.  Psych: Alert, appropriate and with normal affect. Right wrist cath site looks fine.    LABORATORY DATA:  EKG:  EKG is not ordered today.  Lab Results  Component Value Date   WBC 8.8 04/28/2015   HGB 16.4 04/28/2015   HCT 48.4 04/28/2015   PLT 279 04/28/2015   GLUCOSE 136* 04/28/2015   CHOL 136 06/24/2013   TRIG 106.0 06/24/2013   HDL 38.80* 06/24/2013   LDLCALC 76 06/24/2013   ALT 43 12/25/2013   AST 50* 12/25/2013   NA 135 04/28/2015   K 4.9 04/28/2015   CL 99 04/28/2015   CREATININE 0.93 04/28/2015   BUN 18 04/28/2015   CO2 30 04/28/2015   INR 1.02 04/28/2015    BNP (last 3 results) No results for input(s): BNP in the last 8760 hours.  ProBNP (last 3 results) No results for input(s): PROBNP in the last 8760 hours.   Other Studies Reviewed Today: Myoview Study Highlights from 04/2015     The left ventricular ejection fraction is mildly decreased (45-54%).  Nuclear stress EF: 50%.  There was no ST segment deviation noted during stress.  Defect 1: There is a small defect of mild severity present in the  basal inferior and mid inferolateral location.  Findings consistent with ischemia.  This is an intermediate risk study.  Intermediate risk exercise nuclear study with the patient exercising only to a workload of 6.4 mets with a small region of mild ischemia in the mid inferolateral and basal inferior wall.; EF 50% without wall motion abnormality. Ischemia is new c/w prior 2011 report.  Left Heart Cath and Coronary Angiography from 05/2015    Conclusion     Ost RCA lesion, 85% stenosed.  Ost LAD to Prox LAD lesion, 65% stenosed.  1st Diag lesion, 80% stenosed.  There is mild left ventricular systolic dysfunction.  1. 2 vessel CAD. There is moderate ostial LAD and diagonal disease. There is a severe stenosis at the ostium of the RCA. Stents in the proximal LCX and Distal LCx before the PDA are widely patent. The LCx is a large dominant vessel.  2. Low normal LV function with infero-basal akinesis.  Plan: Given lack of symptoms I would recommend medical therapy. Will start Toprol XL 50 mg daily. Patient complains of joint stiffness on lipitor- may need to consider alternative statin-possibly crestor. I would not recommend intervention unless he develops significant angina.      Assessment/Plan: 1. Coronary disease status post remote stenting of the left circumflex and 2000 with a bare-metal stent. Recent abnormal Myoview which has resulted in cardiac cath. Has 2 vessel CAD with moderate ostial LAD and DX disease. Severe stenosis at ostium of the RCA. Stents in LCX are patent - the LCX is a large dominant vessel. He has been placed on Toprol. He is asymptomatic. Will continue with his current regimen. He knows to call for symptoms. See back in 3 months.   2. Hypertension - has better control at home.   3. Hyperlipidemia -  On statin - some aching/stiffness but sounds more like arthritis - he is wanting to continue with his current regimen of Lipitor.  Current medicines  are reviewed with the patient today.  The patient does not have concerns regarding medicines other than what has been noted above.  The following changes have been made:  See above.  Labs/ tests ordered today include:   No orders of the defined types were placed in this encounter.     Disposition:   FU with Dr. [name] in 3 months.   Patient is agreeable to this plan and will call if any problems develop in the interim.   Signed: Burtis Junes, RN, ANP-C 05/27/2015 9:46 AM  [facility] [address] [place], [place]  [address] Phone: [phone] Fax: [phone]

## 2015-07-30 ENCOUNTER — Telehealth: Payer: Self-pay | Admitting: Cardiology

## 2015-07-30 NOTE — Telephone Encounter (Signed)
Received records from [facility] for appointment on 08/26/15 with Dr [name].  Records given to [facility] (medical records) for Dr Doug Sou schedule on 08/26/15. lp

## 2015-08-26 ENCOUNTER — Encounter: Payer: Self-pay | Admitting: Cardiology

## 2015-08-26 ENCOUNTER — Ambulatory Visit (INDEPENDENT_AMBULATORY_CARE_PROVIDER_SITE_OTHER): Payer: Medicare Other | Admitting: Cardiology

## 2015-08-26 VITALS — BP 140/88 | HR 56 | Ht 73.5 in | Wt 198.0 lb

## 2015-08-26 DIAGNOSIS — E785 Hyperlipidemia, unspecified: Secondary | ICD-10-CM

## 2015-08-26 DIAGNOSIS — I10 Essential (primary) hypertension: Secondary | ICD-10-CM

## 2015-08-26 DIAGNOSIS — I2510 Atherosclerotic heart disease of native coronary artery without angina pectoris: Secondary | ICD-10-CM

## 2015-08-26 MED ORDER — ATORVASTATIN CALCIUM 40 MG PO TABS: 40.0000 mg | ORAL_TABLET | Freq: Every day | ORAL | Status: DC

## 2015-08-26 NOTE — Patient Instructions (Signed)
Continue your current therapy  I would keep atorvastatin at 40 mg daily  I will see you in 6 months

## 2015-08-26 NOTE — Progress Notes (Signed)
Cardiology Office Note   Date:  08/26/2015   ID:  John Gamble, DOB [birth year], MRN [id number]  PCP:  Teressa Lower, MD  Cardiologist:  Blessings Inglett [name], MD   Chief Complaint  Patient presents with  . 3 month visit    PCP changed Lipitor to 80mg --pt wants to ask if that was ok, has not started taking it that way yet.//pt states he has balance problems, no other Sx. or concerns.      History of Present Illness: John Gamble is a 82 y.o. male who presents for follow up CAD.He has a history of coronary disease and is status post stenting of the left circumflex coronary in 2000. He had a normal stress Myoview in July of 2011. He also has a history of hypertension and hyperlipidemia. This past fall he had a nuclear stress test that was abnormal. This led to a cardiac cath noted below. Stents were patent but he did have ostial RCA and diagonal disease. Since he was asymptomatic medical therapy was recommended. He is doing very well from a cardiac standpoint without chest pain or SOB. He goes to the gym 4-5 days a week and notes no limitation.  He brings extensive BP readings today. For the most part his BP is well controlled with occasional readings in the [id number] systolic. Lipids levels also reviewed as noted below.     Past Medical History  Diagnosis Date  . Coronary artery disease   . MI, old   . Hypertension   . OA (osteoarthritis)   . Hyperlipidemia   . Pneumonia     hx of 2013     Past Surgical History  Procedure Laterality Date  . Cardiac catheterization  01/26/1999    EF 55%  . Coronary stent placement  2000    LEFT CIRCUMFLEX CORONARY  . Replacement total knee    . Inguinal hernia repair    . Cardiovascular stress test  02/22/2010    EF 61%  . Corneal implant      left  . Total knee arthroplasty Left 09/19/2014    Procedure: LEFT TOTAL KNEE ARTHROPLASTY;  Surgeon: Sydnee Cabal, MD;  Location: [facility];  Service: Orthopedics;  Laterality: Left;  . Cardiac catheterization N/A  05/06/2015    Procedure: Left Heart Cath and Coronary Angiography;  Surgeon: Jamaria Amborn M [name], MD;  Location: [facility];  Service: Cardiovascular;  Laterality: N/A;     Current Outpatient Prescriptions  Medication Sig Dispense Refill  . acyclovir (ZOVIRAX) 800 MG tablet Take 800 mg by mouth daily.     Marland Kitchen aspirin EC 81 MG tablet Take 81 mg by mouth daily before supper.    Marland Kitchen atorvastatin (LIPITOR) 40 MG tablet Take 1 tablet (40 mg total) by mouth daily with supper. 90 tablet 3  . fluticasone (FLONASE) 50 MCG/ACT nasal spray Place 2 sprays into both nostrils daily.    . Glucosamine-Chondroitin (OSTEO BI-FLEX REGULAR STRENGTH PO) Take 2 tablets by mouth 2 (two) times daily.     . hydrochlorothiazide (HYDRODIURIL) 25 MG tablet Take 0.5 tablets (12.5 mg total) by mouth daily. 30 tablet 6  . Loteprednol Etabonate (LOTEMAX) 0.5 % GEL Place 1 drop into the left eye daily.    . metoprolol succinate (TOPROL XL) 50 MG 24 hr tablet Take 1 tablet (50 mg total) by mouth daily. Take with or immediately following a meal. 90 tablet 3  . Multiple Vitamin (MULTIVITAMIN WITH MINERALS) TABS tablet Take 1 tablet by mouth daily.  Centrum Silver    . Omega-3 Fatty Acids (FISH OIL) 1200 MG CAPS Take 1,200 mg by mouth daily.    . polyethylene glycol (MIRALAX / GLYCOLAX) packet Take 17 g by mouth daily. Mix with 8 oz liquid and drink    . quinapril (ACCUPRIL) 40 MG tablet Take 1 tablet (40 mg total) by mouth at bedtime. 30 tablet 5  . Travoprost, BAK Free, (TRAVATAN) 0.004 % SOLN ophthalmic solution Place 1 drop into the left eye at bedtime.    . Zinc 50 MG CAPS Take 50 mg by mouth daily with supper.      No current facility-administered medications for this visit.    Allergies:   Oysters and Sulfa antibiotics    Social History:  The patient  reports that he quit smoking about 26 years ago. He has never used smokeless tobacco. He reports that he does not drink alcohol or use illicit drugs.   Family History:   The patient's family history includes Cancer in his father and mother.    ROS:  Please see the history of present illness.   Otherwise, review of systems are positive for none.   All other systems are reviewed and negative.    PHYSICAL EXAM: VS:  BP 140/88 mmHg  Pulse 56  Ht 6' 1.5" (1.867 m)  Wt 89.812 kg (198 lb)  BMI 25.77 kg/m2 , BMI Body mass index is 25.77 kg/(m^2). GEN: Well nourished, well developed, in no acute distress HEENT: normal Neck: no JVD, carotid bruits, or masses Cardiac: RRR; no murmurs, rubs, or gallops,no edema  Respiratory:  clear to auscultation bilaterally, normal work of breathing GI: soft, nontender, nondistended, + BS MS: no deformity or atrophy, surgical incision left knee healing well.  Skin: warm and dry, no rash Neuro:  Strength and sensation are intact Psych: euthymic mood, full affect   EKG:  EKG is not ordered today.    Recent Labs: 04/28/2015: BUN 18; Creat 0.93; Hemoglobin 16.4; Platelets 279; Potassium 4.9; Sodium 135    Lipid Panel    Component Value Date/Time   CHOL 136 06/24/2013 0908   TRIG 106.0 06/24/2013 0908   HDL 38.80* 06/24/2013 0908   CHOLHDL 4 06/24/2013 0908   VLDL 21.2 06/24/2013 0908   LDLCALC 76 06/24/2013 0908      Wt Readings from Last 3 Encounters:  08/26/15 89.812 kg (198 lb)  05/27/15 87.998 kg (194 lb)  05/06/15 85.276 kg (188 lb)      Other studies Reviewed: Additional studies/ records that were reviewed today include: labs from primary care in Dec 2016. Cholesterol <100, triglycerides 127, HDL 30, LDL <50. A1c 6.8%.  Left Heart Cath and Coronary Angiography    Conclusion     Ost RCA lesion, 85% stenosed.  Ost LAD to Prox LAD lesion, 65% stenosed.  1st Diag lesion, 80% stenosed.  There is mild left ventricular systolic dysfunction.  1. 2 vessel CAD. There is moderate ostial LAD and diagonal disease. There is a severe stenosis at the ostium of the RCA. Stents in the proximal LCX and Distal  LCx before the PDA are widely patent. The LCx is a large dominant vessel.  2. Low normal LV function with infero-basal akinesis.  Plan: Given lack of symptoms I would recommend medical therapy. Will start Toprol XL 50 mg daily. Patient complains of joint stiffness on lipitor- may need to consider alternative statin-possibly crestor. I would not recommend intervention unless he develops significant angina.      Indications  Abnormal nuclear stress test [R93.1 (ICD-10-CM)]    Technique and Indications    Indication: 82 yo WM with remote history of MI and s/p BMS of the LCx presents with abnormal nuclear stress test. This showed mild inferolateral ischemia and reduced exercise capacity since 2011. No anginal symptoms.  Procedural Details: The right wrist was prepped, draped, and anesthetized with 1% lidocaine. Using the modified Seldinger technique, a 6 French slender sheath was introduced into the right radial artery. 3 mg of verapamil was administered through the sheath, weight-based unfractionated heparin was administered intravenously. Standard Judkins catheters were used for selective coronary angiography and left ventriculography. Catheter exchanges were performed over an exchange length guidewire. There were no immediate procedural complications. A TR band was used for radial hemostasis at the completion of the procedure. The patient was transferred to the post catheterization recovery area for further monitoring.  Estimated blood loss <50 mL. There were no immediate complications during the procedure.    Coronary Findings    Dominance: Left   Left Main  The vessel was injected is normal in caliber is angiographically normal.     Left Anterior Descending   . Ost LAD to Prox LAD lesion, 65% stenosed.   . First Diagonal Branch   . 1st Diag lesion, 80% stenosed.     Left Circumflex  The vessel was injected is normal in caliber . The vessel exhibits minimal luminal  irregularities.   Colon Flattery Cx to Prox Cx lesion, 0% stenosed. Previously placed Ost Cx to Prox Cx bare metal stent is patent.   . Left Posterior Descending Artery   . Ost LPDA to LPDA lesion, 0% stenosed. Previously placed Ost LPDA to LPDA bare metal stent is patent.     Right Coronary Artery   . Ost RCA lesion, 85% stenosed. discrete .      Wall Motion                 Left Heart    Left Ventricle The left ventricular size is normal. There is mild left ventricular systolic dysfunction. There are wall motion abnormalities in the left ventricle. inferobasal akinesis There are segmental wall motion abnormalities in the left ventricle.    Coronary Diagrams    Diagnostic Diagram            Implants    Name ID Temporary Type Supply   No information to display    PACS Images    Show images for Cardiac catheterization     Link to Procedure Log    Procedure Log      Hemo Data       Most Recent Value   AO Systolic Pressure  0000000 mmHg   AO Diastolic Pressure  63 mmHg   AO Mean  95 mmHg   LV Systolic Pressure  [id number] mmHg   LV Diastolic Pressure  0 mmHg   LV EDP  9 mmHg   Arterial Occlusion Pressure Extended Systolic Pressure  [id number] mmHg   Arterial Occlusion Pressure Extended Diastolic Pressure  60 mmHg   Arterial Occlusion Pressure Extended Mean Pressure  97 mmHg   Left Ventricular Apex Extended Systolic Pressure  [id number] mmHg   Left Ventricular Apex Extended Diastolic Pressure  0 mmHg   Left Ventricular Apex Extended EDP Pressure  8 mmHg     ASSESSMENT AND PLAN:  1. Coronary disease status post remote stenting of the left circumflex in 2000 with a bare-metal stent. Cath results as noted. Patent stents in LCx.  New disease in diagonal and ostial RCA that correlate with stress test results. He remains asymptomatic. We'll continue with his medical therapy. I would only recommend intervention if he develops significant anginal symptoms.   2. Hypertension,  under fair control.   3. Hyperlipidemia excellent control. Now on atorvastatin. I have recommended continued use of 40 mg daily. LDL is well below target. HDL is still low but higher statin dose will not change this.    Current medicines are reviewed at length with the patient today.  The patient does not have concerns regarding medicines.  The following changes have been made:  no change  Labs/ tests ordered today include: none   No orders of the defined types were placed in this encounter.     Disposition:   FU with me in 6 months   Signed, Breella Vanostrand [name], MD  08/26/2015 1:05 PM    [facility] [place], [place], [place]  [address] Phone: [phone]; Fax: [phone]

## 2015-11-10 ENCOUNTER — Other Ambulatory Visit: Payer: Self-pay

## 2015-11-10 MED ORDER — QUINAPRIL HCL 40 MG PO TABS: 40.0000 mg | ORAL_TABLET | Freq: Every day | ORAL | Status: DC

## 2015-12-30 ENCOUNTER — Other Ambulatory Visit: Payer: Self-pay | Admitting: *Deleted

## 2015-12-30 MED ORDER — HYDROCHLOROTHIAZIDE 25 MG PO TABS: 12.5000 mg | ORAL_TABLET | Freq: Every day | ORAL | Status: DC

## 2016-02-08 ENCOUNTER — Encounter: Payer: Self-pay | Admitting: Cardiology

## 2016-02-08 ENCOUNTER — Ambulatory Visit (INDEPENDENT_AMBULATORY_CARE_PROVIDER_SITE_OTHER): Payer: Medicare Other | Admitting: Cardiology

## 2016-02-08 VITALS — BP 154/82 | HR 56 | Ht 73.5 in | Wt 196.4 lb

## 2016-02-08 DIAGNOSIS — I252 Old myocardial infarction: Secondary | ICD-10-CM

## 2016-02-08 DIAGNOSIS — I10 Essential (primary) hypertension: Secondary | ICD-10-CM | POA: Diagnosis not present

## 2016-02-08 DIAGNOSIS — I2510 Atherosclerotic heart disease of native coronary artery without angina pectoris: Secondary | ICD-10-CM | POA: Diagnosis not present

## 2016-02-08 NOTE — Patient Instructions (Addendum)
Go ahead and switch accupril to valsartan 320 mg daily  Try going off the lipitor for 3 weeks. If joint pains improve let me know and we can try a lower dose. If no improvement then it is not the lipitor and you can resume.   I will see you in 6 months.

## 2016-02-08 NOTE — Progress Notes (Signed)
Cardiology Office Note   Date:  02/08/2016   ID:  John Gamble, DOB [birth year], MRN [id number]  PCP:  Teressa Lower, MD  Cardiologist:  Peter [name], MD   Chief Complaint  Patient presents with  . Fatigue    pt c/o of fatigue  . Coronary Artery Disease      History of Present Illness: John Gamble is a 82 y.o. male who presents for follow up CAD. He has a history of coronary disease and is status post stenting of the left circumflex coronary in 2000. He had a normal stress Myoview in July of 2011. He also has a history of hypertension and hyperlipidemia. In September 2016 he had a nuclear stress test that was abnormal. This led to a cardiac cath.. Stents were patent but he did have ostial RCA (nondominant) and diagonal disease. Since he was asymptomatic medical therapy was recommended. He is doing very well from a cardiac standpoint without chest pain or SOB. He goes to the gym 4-5 days a week and notes no limitation.  He brings extensive BP readings today. His BP typically is between 140-150 with occasional higher readings. Dr. Garlon Hatchet recommended switch from accupril to valsartan but he hasn't made the change yet.  Lipids levels also reviewed as noted below.     Past Medical History  Diagnosis Date  . Coronary artery disease   . MI, old   . Hypertension   . OA (osteoarthritis)   . Hyperlipidemia   . Pneumonia     hx of 2013     Past Surgical History  Procedure Laterality Date  . Cardiac catheterization  01/26/1999    EF 55%  . Coronary stent placement  2000    LEFT CIRCUMFLEX CORONARY  . Replacement total knee    . Inguinal hernia repair    . Cardiovascular stress test  02/22/2010    EF 61%  . Corneal implant      left  . Total knee arthroplasty Left 09/19/2014    Procedure: LEFT TOTAL KNEE ARTHROPLASTY;  Surgeon: Sydnee Cabal, MD;  Location: [facility];  Service: Orthopedics;  Laterality: Left;  . Cardiac catheterization N/A 05/06/2015    Procedure: Left Heart Cath and  Coronary Angiography;  Surgeon: Peter M [name], MD;  Location: [facility];  Service: Cardiovascular;  Laterality: N/A;     Current Outpatient Prescriptions  Medication Sig Dispense Refill  . acyclovir (ZOVIRAX) 800 MG tablet Take 800 mg by mouth daily.     Marland Kitchen aspirin EC 81 MG tablet Take 81 mg by mouth daily before supper.    Marland Kitchen atorvastatin (LIPITOR) 40 MG tablet Take 1 tablet (40 mg total) by mouth daily with supper. 90 tablet 3  . fluticasone (FLONASE) 50 MCG/ACT nasal spray Place 2 sprays into both nostrils daily.    . Glucosamine-Chondroitin (OSTEO BI-FLEX REGULAR STRENGTH PO) Take 2 tablets by mouth 2 (two) times daily.     . hydrochlorothiazide (HYDRODIURIL) 25 MG tablet Take 0.5 tablets (12.5 mg total) by mouth daily. KEEP OV. 30 tablet 0  . Loteprednol Etabonate (LOTEMAX) 0.5 % GEL Place 1 drop into the left eye daily.    . metoprolol succinate (TOPROL XL) 50 MG 24 hr tablet Take 1 tablet (50 mg total) by mouth daily. Take with or immediately following a meal. 90 tablet 3  . Multiple Vitamin (MULTIVITAMIN WITH MINERALS) TABS tablet Take 1 tablet by mouth daily. Centrum Silver    . Omega-3 Fatty Acids (FISH OIL)  1200 MG CAPS Take 1,200 mg by mouth daily.    . polyethylene glycol (MIRALAX / GLYCOLAX) packet Take 17 g by mouth daily. Mix with 8 oz liquid and drink    . quinapril (ACCUPRIL) 40 MG tablet Take 1 tablet (40 mg total) by mouth at bedtime. 30 tablet 5  . Travoprost, BAK Free, (TRAVATAN) 0.004 % SOLN ophthalmic solution Place 1 drop into the left eye at bedtime.    . Zinc 50 MG CAPS Take 50 mg by mouth daily with supper.      No current facility-administered medications for this visit.    Allergies:   Oysters and Sulfa antibiotics    Social History:  The patient  reports that he quit smoking about 26 years ago. He has never used smokeless tobacco. He reports that he does not drink alcohol or use illicit drugs.   Family History:  The patient's family history includes  Cancer in his father and mother.    ROS:  Please see the history of present illness.   Otherwise, review of systems are positive for none.   All other systems are reviewed and negative.    PHYSICAL EXAM: VS:  BP 154/82 mmHg  Pulse 56  Ht 6' 1.5" (1.867 m)  Wt 196 lb 6.4 oz (89.086 kg)  BMI 25.56 kg/m2 , BMI Body mass index is 25.56 kg/(m^2). GEN: Well nourished, well developed, in no acute distress HEENT: normal Neck: no JVD, carotid bruits, or masses Cardiac: RRR; no murmurs, rubs, or gallops,no edema  Respiratory:  clear to auscultation bilaterally, normal work of breathing GI: soft, nontender, nondistended, + BS MS: no deformity or atrophy, surgical incision left knee healing well.  Skin: warm and dry, no rash Neuro:  Strength and sensation are intact Psych: euthymic mood, full affect   EKG:  EKG is not ordered today.    Recent Labs: 04/28/2015: BUN 18; Creat 0.93; Hemoglobin 16.4; Platelets 279; Potassium 4.9; Sodium 135    Lipid Panel    Component Value Date/Time   CHOL 136 06/24/2013 0908   TRIG 106.0 06/24/2013 0908   HDL 38.80* 06/24/2013 0908   CHOLHDL 4 06/24/2013 0908   VLDL 21.2 06/24/2013 0908   LDLCALC 76 06/24/2013 0908      Wt Readings from Last 3 Encounters:  02/08/16 196 lb 6.4 oz (89.086 kg)  08/26/15 198 lb (89.812 kg)  05/27/15 194 lb (87.998 kg)      Other studies Reviewed: Additional studies/ records that were reviewed today include: labs from primary care in June 2017. Cholesterol 97, triglycerides 137, HDL 35 LDL 47. A1c 6.9%.    ASSESSMENT AND PLAN:  1. Coronary disease status post remote stenting of the left circumflex in 2000 with a bare-metal stent. Cath results as noted. Patent stents in LCx. New disease in diagonal and ostial nondominant RCA  that correlate with stress test results. He remains asymptomatic. We'll continue with his medical therapy. I would only recommend intervention if he develops significant anginal symptoms.    2. Hypertension, suboptimal control. Agree with recommendations by Dr Garlon Hatchet. Will switch accupril to Valsartan and monitor effects.   3. Hyperlipidemia excellent control. Now on atorvastatin. I have recommended continued use of 40 mg daily.    Current medicines are reviewed at length with the patient today.  The patient does not have concerns regarding medicines.  The following changes have been made:  no change  Labs/ tests ordered today include: none   No orders of the defined types were  placed in this encounter.     Disposition:   FU with me in 6 months   Signed, Peter [name], MD  02/08/2016 5:26 PM    [facility] [address], [place], [place]  [address] Phone: [phone]; Fax: [phone]

## 2016-03-16 ENCOUNTER — Other Ambulatory Visit: Payer: Self-pay | Admitting: Cardiology

## 2016-03-16 MED ORDER — HYDROCHLOROTHIAZIDE 25 MG PO TABS: 12.5000 mg | ORAL_TABLET | Freq: Every day | ORAL | 11 refills | Status: DC

## 2016-03-16 NOTE — Telephone Encounter (Signed)
Rx request sent to pharmacy.  

## 2016-05-03 ENCOUNTER — Other Ambulatory Visit: Payer: Self-pay

## 2016-05-03 MED ORDER — METOPROLOL SUCCINATE ER 50 MG PO TB24: 50.0000 mg | ORAL_TABLET | Freq: Every day | ORAL | 2 refills | Status: DC

## 2016-08-04 NOTE — Progress Notes (Signed)
Cardiology Office Note   Date:  08/09/2016   ID:  John Gamble, DOB 03-Aug-1930, MRN [id number]  PCP:  Teressa Lower, MD  Cardiologist:  Joey Lierman [name], MD   Chief Complaint  Patient presents with  . Follow-up  . Coronary Artery Disease  . Hypertension      History of Present Illness: John Gamble is a 83 y.o. male who presents for follow up CAD. He has a history of coronary disease and is status post stenting of the left circumflex coronary in 2000. He had a normal stress Myoview in July of 2011. He also has a history of hypertension and hyperlipidemia. In September 2016 he had a nuclear stress test that was abnormal. This led to a cardiac cath.. Stents were patent but he did have ostial RCA (nondominant) and diagonal disease. Since he was asymptomatic medical therapy was recommended. He is doing very well from a cardiac standpoint without chest pain or SOB. He goes to the gym 4-5 days a week and notes no limitation.  He brings extensive BP readings today. His BP typically is between 145-155 with occasional higher readings.     Past Medical History:  Diagnosis Date  . Coronary artery disease   . Hyperlipidemia   . Hypertension   . MI, old   . OA (osteoarthritis)   . Pneumonia    hx of 2013     Past Surgical History:  Procedure Laterality Date  . CARDIAC CATHETERIZATION  01/26/1999   EF 55%  . CARDIAC CATHETERIZATION N/A 05/06/2015   Procedure: Left Heart Cath and Coronary Angiography;  Surgeon: Hazelynn Mckenny M [name], MD;  Location: [facility];  Service: Cardiovascular;  Laterality: N/A;  . CARDIOVASCULAR STRESS TEST  02/22/2010   EF 61%  . corneal implant     left  . CORONARY STENT PLACEMENT  2000   LEFT CIRCUMFLEX CORONARY  . INGUINAL HERNIA REPAIR    . REPLACEMENT TOTAL KNEE    . TOTAL KNEE ARTHROPLASTY Left 09/19/2014   Procedure: LEFT TOTAL KNEE ARTHROPLASTY;  Surgeon: Sydnee Cabal, MD;  Location: [facility];  Service: Orthopedics;  Laterality: Left;     Current  Outpatient Prescriptions  Medication Sig Dispense Refill  . acyclovir (ZOVIRAX) 800 MG tablet Take 800 mg by mouth daily.     Marland Kitchen aspirin EC 81 MG tablet Take 81 mg by mouth daily before supper.    . fluticasone (FLONASE) 50 MCG/ACT nasal spray Place 2 sprays into both nostrils daily as needed.     . Glucosamine-Chondroitin (OSTEO BI-FLEX REGULAR STRENGTH PO) Take 2 tablets by mouth 2 (two) times daily.     . hydrochlorothiazide (HYDRODIURIL) 25 MG tablet Take 0.5 tablets (12.5 mg total) by mouth daily. 30 tablet 11  . Loteprednol Etabonate (LOTEMAX) 0.5 % GEL Place 1 drop into the left eye daily.    . metoprolol succinate (TOPROL XL) 50 MG 24 hr tablet Take 1 tablet (50 mg total) by mouth daily. Take with or immediately following a meal. (Patient taking differently: Take 25 mg by mouth 2 (two) times daily. Take with or immediately following a meal.) 90 tablet 2  . Multiple Vitamin (MULTIVITAMIN WITH MINERALS) TABS tablet Take 1 tablet by mouth daily. Centrum Silver    . Omega-3 Fatty Acids (FISH OIL) 1200 MG CAPS Take 1,200 mg by mouth daily.    . polyethylene glycol (MIRALAX / GLYCOLAX) packet Take 17 g by mouth daily. Mix with 8 oz liquid and drink    .  rosuvastatin (CRESTOR) 20 MG tablet Take 20 mg by mouth daily.    . Travoprost, BAK Free, (TRAVATAN) 0.004 % SOLN ophthalmic solution Place 1 drop into the left eye at bedtime.    . valsartan (DIOVAN) 320 MG tablet Take 320 mg by mouth daily.    . Zinc 50 MG CAPS Take 50 mg by mouth daily with supper.     Marland Kitchen amLODipine (NORVASC) 5 MG tablet Take 1 tablet (5 mg total) by mouth daily. 180 tablet 3   No current facility-administered medications for this visit.     Allergies:   Atorvastatin; Oysters [shellfish allergy]; and Sulfa antibiotics    Social History:  The patient  reports that he quit smoking about 27 years ago. He has a 25.00 pack-year smoking history. He has never used smokeless tobacco. He reports that he does not drink alcohol or use  drugs.   Family History:  The patient's family history includes Cancer in his father and mother.    ROS:  Please see the history of present illness.   Otherwise, review of systems are positive for none.   All other systems are reviewed and negative.    PHYSICAL EXAM: VS:  BP (!) 160/84   Pulse 61   Ht 6\' 1"  (1.854 m)   Wt 200 lb 9.6 oz (91 kg)   BMI 26.47 kg/m  , BMI Body mass index is 26.47 kg/m. GEN: Well nourished, well developed, in no acute distress  HEENT: normal  Neck: no JVD, carotid bruits, or masses Cardiac: RRR; no murmurs, rubs, or gallops,no edema  Respiratory:  clear to auscultation bilaterally, normal work of breathing GI: soft, nontender, nondistended, + BS MS: no deformity or atrophy, surgical incision left knee healing well.   Skin: warm and dry, no rash Neuro:  Strength and sensation are intact Psych: euthymic mood, full affect   EKG:  EKG is ordered today. It shows NSR with rate 61. Incomplete RBBB. I have personally reviewed and interpreted this study.    Recent Labs: No results found for requested labs within last 8760 hours.    Lipid Panel    Component Value Date/Time   CHOL 136 06/24/2013 0908   TRIG 106.0 06/24/2013 0908   HDL 38.80 (L) 06/24/2013 0908   CHOLHDL 4 06/24/2013 0908   VLDL 21.2 06/24/2013 0908   LDLCALC 76 06/24/2013 0908      Wt Readings from Last 3 Encounters:  08/09/16 200 lb 9.6 oz (91 kg)  02/08/16 196 lb 6.4 oz (89.1 kg)  08/26/15 198 lb (89.8 kg)      Other studies Reviewed: Additional studies/ records that were reviewed today include: labs from primary care in November 2017. Cholesterol 164, triglycerides 300, HDL 32 LDL 83. A1c 6.7%.    ASSESSMENT AND PLAN:  1. Coronary disease status post remote stenting of the left circumflex in 2000 with a bare-metal stent. Cath results as noted. Patent stents in LCx. New disease in diagonal and ostial nondominant RCA  that correlate with stress test results. He remains  asymptomatic. We'll continue with his medical therapy. I would only recommend intervention if he develops significant anginal symptoms.   2. Hypertension, suboptimal control. Apparently he is taking both accupril and diovan. I have recommended stopping Accupril and starting amlodipine 5 mg daily.   3. Hyperlipidemia lipid levels increased after stopping lipitor. Now on crestor- started in November-and tolerating well.    Current medicines are reviewed at length with the patient today.  The patient does  not have concerns regarding medicines.  The following changes have been made:  See above.  Labs/ tests ordered today include: none   Orders Placed This Encounter  Procedures  . EKG 12-Lead     Disposition:   FU with me in 6 months   Signed, Jauan Wohl [name], MD  08/09/2016 12:25 PM    [facility] [address], [place], [place]  [address] Phone: [phone]; Fax: [phone]

## 2016-08-09 ENCOUNTER — Ambulatory Visit (INDEPENDENT_AMBULATORY_CARE_PROVIDER_SITE_OTHER): Payer: Medicare Other | Admitting: Cardiology

## 2016-08-09 ENCOUNTER — Encounter: Payer: Self-pay | Admitting: Cardiology

## 2016-08-09 VITALS — BP 160/84 | HR 61 | Ht 73.0 in | Wt 200.6 lb

## 2016-08-09 DIAGNOSIS — I252 Old myocardial infarction: Secondary | ICD-10-CM | POA: Diagnosis not present

## 2016-08-09 DIAGNOSIS — I10 Essential (primary) hypertension: Secondary | ICD-10-CM | POA: Diagnosis not present

## 2016-08-09 DIAGNOSIS — E782 Mixed hyperlipidemia: Secondary | ICD-10-CM | POA: Diagnosis not present

## 2016-08-09 DIAGNOSIS — I2510 Atherosclerotic heart disease of native coronary artery without angina pectoris: Secondary | ICD-10-CM

## 2016-08-09 MED ORDER — AMLODIPINE BESYLATE 5 MG PO TABS: 5.0000 mg | ORAL_TABLET | Freq: Every day | ORAL | 3 refills | Status: DC

## 2016-08-09 NOTE — Patient Instructions (Signed)
Stop taking quinapril  Start amlodipine 5 mg daily.  Continue your other therapy  I will see you in 6 months.

## 2017-02-15 NOTE — Progress Notes (Signed)
Cardiology Office Note    Date:  02/20/2017   ID:  John Gamble, DOB 1931-01-08, MRN [id number]  PCP:  Algis Greenhouse, MD  Cardiologist:  Sandie Swayze [name], MD   Chief Complaint  Patient presents with  . Coronary Artery Disease      History of Present Illness: John Gamble is a 83 y.o. male who presents for follow up CAD. He has a history of coronary disease and is status post stenting of the left circumflex coronary in 2000. He had a normal stress Myoview in July of 2011. He also has a history of hypertension and hyperlipidemia.   In September 2016 he had a nuclear stress test that was abnormal. This led to a cardiac cath.. Stents were patent but he did have ostial RCA (nondominant) and diagonal disease. Since he was asymptomatic medical therapy was recommended.   He is doing very well from a cardiac standpoint without chest pain or SOB. He goes to the gym 4-5 days a week and notes no limitation. He is able to ride a bike for one hour (16 miles). He brings extensive BP readings today. His BP typically is between 130-140 in am and lower in the pm.   Past Medical History:  Diagnosis Date  . Coronary artery disease   . Hyperlipidemia   . Hypertension   . MI, old   . OA (osteoarthritis)   . Pneumonia    hx of 2013     Past Surgical History:  Procedure Laterality Date  . CARDIAC CATHETERIZATION  01/26/1999   EF 55%  . CARDIAC CATHETERIZATION N/A 05/06/2015   Procedure: Left Heart Cath and Coronary Angiography;  Surgeon: Ulas Zuercher M [name], MD;  Location: [facility];  Service: Cardiovascular;  Laterality: N/A;  . CARDIOVASCULAR STRESS TEST  02/22/2010   EF 61%  . corneal implant     left  . CORONARY STENT PLACEMENT  2000   LEFT CIRCUMFLEX CORONARY  . INGUINAL HERNIA REPAIR    . REPLACEMENT TOTAL KNEE    . TOTAL KNEE ARTHROPLASTY Left 09/19/2014   Procedure: LEFT TOTAL KNEE ARTHROPLASTY;  Surgeon: Sydnee Cabal, MD;  Location: [facility];  Service: Orthopedics;  Laterality:  Left;     Current Outpatient Prescriptions  Medication Sig Dispense Refill  . acyclovir (ZOVIRAX) 800 MG tablet Take 800 mg by mouth daily.     Marland Kitchen amLODipine (NORVASC) 5 MG tablet Take 1 tablet (5 mg total) by mouth daily. 180 tablet 3  . aspirin EC 81 MG tablet Take 81 mg by mouth daily before supper.    . fluticasone (FLONASE) 50 MCG/ACT nasal spray Place 2 sprays into both nostrils daily as needed.     . Glucosamine-Chondroitin (OSTEO BI-FLEX REGULAR STRENGTH PO) Take 2 tablets by mouth 2 (two) times daily.     . hydrochlorothiazide (HYDRODIURIL) 25 MG tablet Take 0.5 tablets (12.5 mg total) by mouth daily. 30 tablet 11  . Loteprednol Etabonate (LOTEMAX) 0.5 % GEL Place 1 drop into the left eye daily.    . metoprolol succinate (TOPROL XL) 50 MG 24 hr tablet Take 1 tablet (50 mg total) by mouth daily. Take with or immediately following a meal. (Patient taking differently: Take 25 mg by mouth 2 (two) times daily. Take with or immediately following a meal.) 90 tablet 2  . Multiple Vitamin (MULTIVITAMIN WITH MINERALS) TABS tablet Take 1 tablet by mouth daily. Centrum Silver    . Omega-3 Fatty Acids (FISH OIL) 1200 MG CAPS  Take 1,200 mg by mouth daily.    . polyethylene glycol (MIRALAX / GLYCOLAX) packet Take 17 g by mouth daily. Mix with 8 oz liquid and drink    . rosuvastatin (CRESTOR) 20 MG tablet Take 20 mg by mouth daily.    . Travoprost, BAK Free, (TRAVATAN) 0.004 % SOLN ophthalmic solution Place 1 drop into the left eye at bedtime.    . Zinc 50 MG CAPS Take 50 mg by mouth daily with supper.     . olmesartan (BENICAR) 40 MG tablet Take 1 tablet (40 mg total) by mouth daily. 90 tablet 3   No current facility-administered medications for this visit.     Allergies:   Atorvastatin; Oysters [shellfish allergy]; and Sulfa antibiotics    Social History:  The patient  reports that he quit smoking about 28 years ago. He has a 25.00 pack-year smoking history. He has never used smokeless tobacco.  He reports that he does not drink alcohol or use drugs.   Family History:  The patient's family history includes Cancer in his father and mother.    ROS:  Please see the history of present illness.   Otherwise, review of systems are positive for none.   All other systems are reviewed and negative.    PHYSICAL EXAM: VS:  BP (!) 141/67   Pulse (!) 49   Ht 6\' 1"  (1.854 m)   Wt 193 lb 3.2 oz (87.6 kg)   SpO2 96%   BMI 25.49 kg/m  , BMI Body mass index is 25.49 kg/m. GEN: Well nourished, well developed, in no acute distress  HEENT: normal  Neck: no JVD, carotid bruits, or masses Cardiac: RRR; no murmurs, rubs, or gallops,no edema  Respiratory:  clear to auscultation bilaterally, normal work of breathing GI: soft, nontender, nondistended, + BS MS: no deformity or atrophy, surgical incision left knee healing well.   Skin: warm and dry, no rash Neuro:  Strength and sensation are intact Psych: euthymic mood, full affect   EKG:  EKG is not ordered today.    Recent Labs: No results found for requested labs within last 8760 hours.    Lipid Panel    Component Value Date/Time   CHOL 136 06/24/2013 0908   TRIG 106.0 06/24/2013 0908   HDL 38.80 (L) 06/24/2013 0908   CHOLHDL 4 06/24/2013 0908   VLDL 21.2 06/24/2013 0908   LDLCALC 76 06/24/2013 0908      Wt Readings from Last 3 Encounters:  02/20/17 193 lb 3.2 oz (87.6 kg)  08/09/16 200 lb 9.6 oz (91 kg)  02/08/16 196 lb 6.4 oz (89.1 kg)      Other studies Reviewed: Additional studies/ records that were reviewed today include:  November 2017. Cholesterol 164, triglycerides 300, HDL 32 LDL 83. A1c 6.7%. Dated 12/28/16: glucose 150, A1c 7%. Cholesterol 94, triglyderdes 198, HDL 30, LDL 38. Other chemistries normal.     ASSESSMENT AND PLAN:  1. Coronary disease status post remote stenting of the left circumflex in 2000 with a bare-metal stent. Cath results as noted. Patent stents in LCx. New disease in diagonal and ostial  nondominant RCA  that correlate with stress test results. He remains asymptomatic. We'll continue with his medical therapy.   2. Hypertension, improved control since staring amlodipine. He is on valsartan that has been recalled. Will switch this to olmesartan 40 mg daily.   3. Hyperlipidemia- cholesterol well controlled.    Current medicines are reviewed at length with the patient today.  The  patient does not have concerns regarding medicines.  The following changes have been made:  See above.  Labs/ tests ordered today include: none   No orders of the defined types were placed in this encounter.    Disposition:   FU with me in 6 months   Signed, Cimone Fahey [name], MD  02/20/2017 11:46 AM    [facility] [address], [name], [place]  [address] Phone: [phone]; Fax: [phone]

## 2017-02-20 ENCOUNTER — Ambulatory Visit (INDEPENDENT_AMBULATORY_CARE_PROVIDER_SITE_OTHER): Payer: Medicare Other | Admitting: Cardiology

## 2017-02-20 ENCOUNTER — Encounter: Payer: Self-pay | Admitting: Cardiology

## 2017-02-20 VITALS — BP 141/67 | HR 49 | Ht 73.0 in | Wt 193.2 lb

## 2017-02-20 DIAGNOSIS — E782 Mixed hyperlipidemia: Secondary | ICD-10-CM | POA: Diagnosis not present

## 2017-02-20 DIAGNOSIS — I252 Old myocardial infarction: Secondary | ICD-10-CM | POA: Diagnosis not present

## 2017-02-20 DIAGNOSIS — I2510 Atherosclerotic heart disease of native coronary artery without angina pectoris: Secondary | ICD-10-CM

## 2017-02-20 DIAGNOSIS — I10 Essential (primary) hypertension: Secondary | ICD-10-CM | POA: Diagnosis not present

## 2017-02-20 MED ORDER — OLMESARTAN MEDOXOMIL 40 MG PO TABS: 40.0000 mg | ORAL_TABLET | Freq: Every day | ORAL | 3 refills | Status: DC

## 2017-02-20 NOTE — Patient Instructions (Addendum)
We will switch valsartan to olmesartan 40 mg daily  Continue your current therapy  I will see you in 6 months

## 2017-03-09 IMAGING — NM NM MISC PROCEDURE
6 series · 36 of 36 positions shown · non-contrast
Comparison: none

[Series 1: wbr rest · 6.40mm/px · 6 of 64 frames shown]
[frame 6/64]
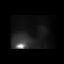
[frame 16/64]
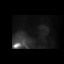
[frame 27/64]
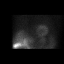
[frame 38/64]
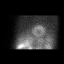
[frame 48/64]
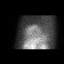
[frame 59/64]
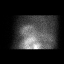

[Series 1: wbr_r-proj_st wbr rest · 6.40mm/px · 6 of 64 frames shown]
[frame 6/64]
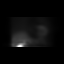
[frame 16/64]
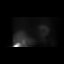
[frame 27/64]
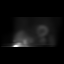
[frame 38/64]
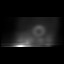
[frame 48/64]
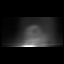
[frame 59/64]
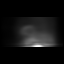

[Series 2: wbr stress-gsp · 6.40mm/px · 6 of 512 frames shown]
[frame 43/512]
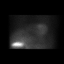
[frame 128/512]
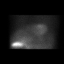
[frame 214/512]
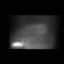
[frame 299/512]
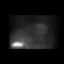
[frame 384/512]
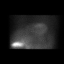
[frame 470/512]
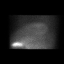

[Series 2: wbr_s-proj_st wbr stress-gsp · 6.40mm/px · 6 of 512 frames shown]
[frame 43/512]
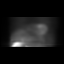
[frame 128/512]
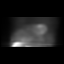
[frame 214/512]
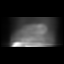
[frame 299/512]
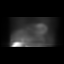
[frame 384/512]
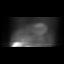
[frame 470/512]
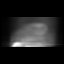

[Series 3: wbr stress-sum-em · 6.40mm/px · 6 of 64 frames shown]
[frame 6/64]
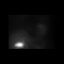
[frame 16/64]
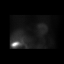
[frame 27/64]
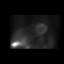
[frame 38/64]
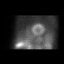
[frame 48/64]
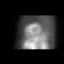
[frame 59/64]
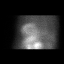

[Series 3: wbr_s-proj_st wbr stress-sum-em · 6.40mm/px · 6 of 64 frames shown]
[frame 6/64]
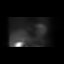
[frame 16/64]
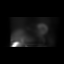
[frame 27/64]
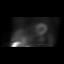
[frame 38/64]
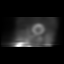
[frame 48/64]
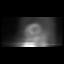
[frame 59/64]
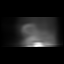

[36 of 36 positions shown; findings below may reference images not displayed]

Canned report from images found in remote index.

Refer to host system for actual result text.

## 2017-03-26 ENCOUNTER — Emergency Department (HOSPITAL_COMMUNITY)
Admission: EM | Admit: 2017-03-26 | Discharge: 2017-03-26 | Disposition: A | Discharge status: Home / Self Care | Payer: Medicare Other | Attending: Emergency Medicine | Admitting: Emergency Medicine

## 2017-03-26 ENCOUNTER — Emergency Department (HOSPITAL_COMMUNITY): Payer: Medicare Other

## 2017-03-26 ENCOUNTER — Encounter (HOSPITAL_COMMUNITY): Payer: Self-pay | Admitting: Emergency Medicine

## 2017-03-26 ENCOUNTER — Other Ambulatory Visit: Payer: Self-pay

## 2017-03-26 DIAGNOSIS — I10 Essential (primary) hypertension: Secondary | ICD-10-CM | POA: Diagnosis not present

## 2017-03-26 DIAGNOSIS — M5416 Radiculopathy, lumbar region: Secondary | ICD-10-CM | POA: Diagnosis not present

## 2017-03-26 DIAGNOSIS — R05 Cough: Secondary | ICD-10-CM | POA: Insufficient documentation

## 2017-03-26 DIAGNOSIS — Z7982 Long term (current) use of aspirin: Secondary | ICD-10-CM | POA: Diagnosis not present

## 2017-03-26 DIAGNOSIS — R52 Pain, unspecified: Secondary | ICD-10-CM

## 2017-03-26 DIAGNOSIS — Z79899 Other long term (current) drug therapy: Secondary | ICD-10-CM | POA: Insufficient documentation

## 2017-03-26 DIAGNOSIS — I252 Old myocardial infarction: Secondary | ICD-10-CM | POA: Insufficient documentation

## 2017-03-26 DIAGNOSIS — R059 Cough, unspecified: Secondary | ICD-10-CM

## 2017-03-26 DIAGNOSIS — Z87891 Personal history of nicotine dependence: Secondary | ICD-10-CM | POA: Diagnosis not present

## 2017-03-26 DIAGNOSIS — I2510 Atherosclerotic heart disease of native coronary artery without angina pectoris: Secondary | ICD-10-CM | POA: Diagnosis not present

## 2017-03-26 MED ORDER — TRAMADOL HCL 50 MG PO TABS: 50.0000 mg | ORAL_TABLET | Freq: Four times a day (QID) | ORAL | 0 refills | Status: DC | PRN

## 2017-03-26 MED ORDER — BENZONATATE 100 MG PO CAPS: 100.0000 mg | ORAL_CAPSULE | Freq: Three times a day (TID) | ORAL | 0 refills | Status: DC

## 2017-03-26 NOTE — ED Triage Notes (Signed)
Pt hard of hearing. Per wife, several days of coughing, non productive. Pt states right nostril drainage only. PT also reports several days of right hip and right knee pain. Pt sees physical therapists, hx of knee replacements. Pt 2 motrin and 1 tylenol for pain today. Denies recent injury to hip or knee.

## 2017-03-26 NOTE — ED Provider Notes (Signed)
[facility] DEPT Provider Note   CSN: [id number] Arrival date & time: 03/26/17  1358     History   Chief Complaint Chief Complaint  Patient presents with  . Cough    HPI John Gamble is a 83 y.o. male.  Patient is an 83 year old male who presents with 2 complaints. He has a cough which has been going on for several days. He has a postnasal drip with some rhinorrhea. No fevers. He hasn't had any significant coughing since he's been here in emergency department he says. No fevers. No shortness of breath. He hasn't been taking anything at home for the symptoms.  He also complains of pain to his right leg. He states it starts in his lower back and radiates down the lateral aspect of the right leg passing the hip and knee. He denies any numbness to the leg. No weakness to the leg. He states this started after he was doing some intensive physical therapy including riding a bike. He states for the bike for 16 miles. His orthopedist is with [facility].      Past Medical History:  Diagnosis Date  . Coronary artery disease   . Hyperlipidemia   . Hypertension   . MI, old   . OA (osteoarthritis)   . Pneumonia    hx of 2013     Patient Active Problem List   Diagnosis Date Noted  . Abnormal nuclear stress test 05/06/2015  . Primary osteoarthritis of knee 09/19/2014  . S/P knee replacement 09/19/2014  . S/P total knee arthroplasty 09/19/2014  . Lower gastrointestinal bleed 01/30/2012  . Coronary artery disease   . MI, old   . Hypertension   . Hyperlipidemia     Past Surgical History:  Procedure Laterality Date  . CARDIAC CATHETERIZATION  01/26/1999   EF 55%  . CARDIAC CATHETERIZATION N/A 05/06/2015   Procedure: Left Heart Cath and Coronary Angiography;  Surgeon: Peter M [name], MD;  Location: [facility];  Service: Cardiovascular;  Laterality: N/A;  . CARDIOVASCULAR STRESS TEST  02/22/2010   EF 61%  . corneal implant     left  . CORONARY STENT PLACEMENT   2000   LEFT CIRCUMFLEX CORONARY  . INGUINAL HERNIA REPAIR    . REPLACEMENT TOTAL KNEE    . TOTAL KNEE ARTHROPLASTY Left 09/19/2014   Procedure: LEFT TOTAL KNEE ARTHROPLASTY;  Surgeon: Sydnee Cabal, MD;  Location: [facility];  Service: Orthopedics;  Laterality: Left;       Home Medications    Prior to Admission medications   Medication Sig Start Date End Date Taking? Authorizing Provider  acyclovir (ZOVIRAX) 800 MG tablet Take 800 mg by mouth daily.  03/03/14   [provider]  amLODipine (NORVASC) 5 MG tablet Take 1 tablet (5 mg total) by mouth daily. 08/09/16 07/30/18  [name], Peter M, MD  aspirin EC 81 MG tablet Take 81 mg by mouth daily before supper.    [provider]  benzonatate (TESSALON) 100 MG capsule Take 1 capsule (100 mg total) by mouth every 8 (eight) hours. 03/26/17   Malvin Johns, MD  fluticasone (FLONASE) 50 MCG/ACT nasal spray Place 2 sprays into both nostrils daily as needed.     [provider]  Glucosamine-Chondroitin (OSTEO BI-FLEX REGULAR STRENGTH PO) Take 2 tablets by mouth 2 (two) times daily.     [provider]  hydrochlorothiazide (HYDRODIURIL) 25 MG tablet Take 0.5 tablets (12.5 mg total) by mouth daily. 03/16/16   [name], Peter M,  MD  Loteprednol Etabonate (LOTEMAX) 0.5 % GEL Place 1 drop into the left eye daily.    [provider]  metoprolol succinate (TOPROL XL) 50 MG 24 hr tablet Take 1 tablet (50 mg total) by mouth daily. Take with or immediately following a meal. Patient taking differently: Take 25 mg by mouth 2 (two) times daily. Take with or immediately following a meal. 05/03/16   [name], Peter M, MD  Multiple Vitamin (MULTIVITAMIN WITH MINERALS) TABS tablet Take 1 tablet by mouth daily. Centrum Silver    [provider]  olmesartan (BENICAR) 40 MG tablet Take 1 tablet (40 mg total) by mouth daily. 02/20/17   [name], Peter M, MD  Omega-3 Fatty Acids (FISH OIL) 1200 MG CAPS Take 1,200 mg by mouth daily.     [provider]  polyethylene glycol (MIRALAX / GLYCOLAX) packet Take 17 g by mouth daily. Mix with 8 oz liquid and drink    [provider]  rosuvastatin (CRESTOR) 20 MG tablet Take 20 mg by mouth daily.    [provider]  traMADol (ULTRAM) 50 MG tablet Take 1 tablet (50 mg total) by mouth every 6 (six) hours as needed. 03/26/17   Malvin Johns, MD  Travoprost, BAK Free, (TRAVATAN) 0.004 % SOLN ophthalmic solution Place 1 drop into the left eye at bedtime.    [provider]  Zinc 50 MG CAPS Take 50 mg by mouth daily with supper.     [provider]    Family History Family History  Problem Relation Age of Onset  . Cancer Mother   . Cancer Father     Social History Social History  Substance Use Topics  . Smoking status: Former Smoker    Packs/day: 0.50    Years: 50.00    Quit date: 02/23/1989  . Smokeless tobacco: Never Used  . Alcohol use No     Allergies   Atorvastatin; Oysters [shellfish allergy]; and Sulfa antibiotics   Review of Systems Review of Systems  Constitutional: Negative for chills, diaphoresis, fatigue and fever.  HENT: Negative for congestion, rhinorrhea and sneezing.   Eyes: Negative.   Respiratory: Positive for cough. Negative for chest tightness and shortness of breath.   Cardiovascular: Negative for chest pain and leg swelling.  Gastrointestinal: Negative for abdominal pain, blood in stool, diarrhea, nausea and vomiting.  Genitourinary: Negative for difficulty urinating, flank pain, frequency and hematuria.  Musculoskeletal: Positive for arthralgias and back pain.  Skin: Negative for rash.  Neurological: Negative for dizziness, speech difficulty, weakness, numbness and headaches.     Physical Exam Updated Vital Signs BP (!) 155/62   Pulse (!) 50   Temp 97.6 F (36.4 C) (Oral)   Resp (!) 28   Ht 6\' 1"  (1.854 m)   Wt 89.8 kg (198 lb)   SpO2 98%   BMI 26.12 kg/m   Physical Exam  Constitutional:  He is oriented to person, place, and time. He appears well-developed and well-nourished.  HENT:  Head: Normocephalic and atraumatic.  Eyes: Pupils are equal, round, and reactive to light.  Neck: Normal range of motion. Neck supple.  Cardiovascular: Normal rate, regular rhythm and normal heart sounds.   Pulmonary/Chest: Effort normal and breath sounds normal. No respiratory distress. He has no wheezes. He has no rales. He exhibits no tenderness.  Abdominal: Soft. Bowel sounds are normal. There is no tenderness. There is no rebound and no guarding.  Musculoskeletal: Normal range of motion. He exhibits no edema.  Mild tenderness  to right lumbar paraspinal area and right lateral hip.  No pain on range of motion the hip joint. There is pain to the lateral leg and the lateral knee. There doesn't seem to be any pain on range of motion the knee joint as well. No swelling or effusion is noted. He has normal sensation and motor function in the leg. Pedal pulses are intact.  Lymphadenopathy:    He has no cervical adenopathy.  Neurological: He is alert and oriented to person, place, and time.  Skin: Skin is warm and dry. No rash noted.  Psychiatric: He has a normal mood and affect.     ED Treatments / Results  Labs (all labs ordered are listed, but only abnormal results are displayed) Labs Reviewed - No data to display  EKG  EKG Interpretation None       Radiology Dg Chest 2 View  Result Date: 03/26/2017 CLINICAL DATA:  Productive cough and runny nose. History of pneumonia. EXAM: CHEST  2 VIEW COMPARISON:  Chest radiograph April 30, 2015 FINDINGS: The cardiac silhouette is mildly enlarged. Coronary artery stent noted. Mediastinal silhouette is normal. Trace calcific atherosclerosis aortic knob. No pleural effusion or focal consolidation. No pneumothorax. Mild degenerative change of the thoracic spine. IMPRESSION: Mild cardiomegaly.  No acute pulmonary process. Aortic Atherosclerosis  (ICD10-I70.0). Electronically Signed   By: Elon Alas M.D.   On: 03/26/2017 15:56   Dg Knee 2 Views Right  Result Date: 03/26/2017 CLINICAL DATA:  Right knee pain after bike ride 1 week ago. Initial encounter. EXAM: RIGHT KNEE - 2 VIEW COMPARISON:  None. FINDINGS: Right knee prosthesis seen in appropriate position. No evidence of acute fracture or dislocation. No evidence of knee joint effusion. No focal lytic or sclerotic bone lesions identified. Peripheral vascular calcification noted. IMPRESSION: No acute findings. Electronically Signed   By: Earle Gell M.D.   On: 03/26/2017 15:55   Dg Hip Unilat With Pelvis 2-3 Views Right  Result Date: 03/26/2017 CLINICAL DATA:  Right hip pain after bike ride 1 week ago. Initial encounter. EXAM: DG HIP (WITH OR WITHOUT PELVIS) 2-3V RIGHT COMPARISON:  None. FINDINGS: There is no evidence of hip fracture or dislocation. There is no evidence of arthropathy or other focal bone abnormality. IMPRESSION: Negative. Electronically Signed   By: Earle Gell M.D.   On: 03/26/2017 15:54    Procedures Procedures (including critical care time)  Medications Ordered in ED Medications - No data to display   Initial Impression / Assessment and Plan / ED Course  I have reviewed the triage vital signs and the nursing notes.  Pertinent labs & imaging results that were available during my care of the patient were reviewed by me and considered in my medical decision making (see chart for details).     Patient's lungs are clear. He has no hypoxia. No increased work of breathing. No current coughing. His chest x-ray is negative for pneumonia. I will give him a prescription for Tessalon Perles for coughing.  He also has radicular back pain. There is no discrete spinal tenderness. He had imaging studies of his hip and knee which were ordered in triage. These are negative for acute abnormalities. Don't feel this point that we need to bring him back for lumbar spine  x-rays. He has no recent trauma. I feel at some point if his symptoms are not improving he may need an MRI at this point we will give him a prescription for tramadol. He will follow-up with his orthopedic  physician. He has no neurologic deficits or signs of cauda equina.  Final Clinical Impressions(s) / ED Diagnoses   Final diagnoses:  Cough  Lumbar radicular pain    New Prescriptions New Prescriptions   BENZONATATE (TESSALON) 100 MG CAPSULE    Take 1 capsule (100 mg total) by mouth every 8 (eight) hours.   TRAMADOL (ULTRAM) 50 MG TABLET    Take 1 tablet (50 mg total) by mouth every 6 (six) hours as needed.     Malvin Johns, MD 03/26/17 [phone]

## 2017-04-11 ENCOUNTER — Telehealth: Payer: Self-pay | Admitting: Cardiology

## 2017-04-11 NOTE — Telephone Encounter (Signed)
New message       *STAT* If patient is at the pharmacy, call can be transferred to refill team.   1. Which medications need to be refilled? (please list name of each medication and dose if known)  Metoprolol 50mg  2. Which pharmacy/location (including street and city if local pharmacy) is medication to be sent to?  drug 3. Do they need a 30 day or 90 day supply? 90 day

## 2017-04-12 MED ORDER — METOPROLOL SUCCINATE ER 50 MG PO TB24: 50.0000 mg | ORAL_TABLET | Freq: Every day | ORAL | 2 refills | Status: DC

## 2017-04-12 NOTE — Telephone Encounter (Signed)
Rx sent electronically to preferred pharmacy

## 2017-04-26 ENCOUNTER — Other Ambulatory Visit: Payer: Self-pay | Admitting: *Deleted

## 2017-04-26 MED ORDER — HYDROCHLOROTHIAZIDE 25 MG PO TABS: 12.5000 mg | ORAL_TABLET | Freq: Every day | ORAL | 11 refills | Status: DC

## 2017-08-20 NOTE — Progress Notes (Signed)
Cardiology Office Note    Date:  08/21/2017   ID:  John Gamble, DOB 1930/10/22, MRN [id number]  PCP:  Algis Greenhouse, MD  Cardiologist:  Tameron Lama [name], MD   Chief Complaint  Patient presents with  . Follow-up  . Coronary Artery Disease  . Hypertension      History of Present Illness: John Gamble is a 84 y.o. male who presents for follow up CAD. He has a history of coronary disease and is status post stenting of the left circumflex coronary in 2000. He had a normal stress Myoview in July of 2011. He also has a history of hypertension and hyperlipidemia.   In September 2016 he had a nuclear stress test that was abnormal. This led to a cardiac cath.. Stents were patent but he did have ostial RCA (nondominant) and diagonal disease. Since he was asymptomatic medical therapy was recommended.   He continues to do  very well from a cardiac standpoint without chest pain or SOB. He goes to the gym 5 days a week and notes no limitation. He is able to ride a bike for one hour (16 miles). His BP has been well controlled. He notes his A1c went up to 7.5%. It was recommended he go on medication but he wanted to work on diet first.    Past Medical History:  Diagnosis Date  . Coronary artery disease   . Hyperlipidemia   . Hypertension   . MI, old   . OA (osteoarthritis)   . Pneumonia    hx of 2013     Past Surgical History:  Procedure Laterality Date  . CARDIAC CATHETERIZATION  01/26/1999   EF 55%  . CARDIAC CATHETERIZATION N/A 05/06/2015   Procedure: Left Heart Cath and Coronary Angiography;  Surgeon: Marshelle Bilger M [name], MD;  Location: [facility];  Service: Cardiovascular;  Laterality: N/A;  . CARDIOVASCULAR STRESS TEST  02/22/2010   EF 61%  . corneal implant     left  . CORONARY STENT PLACEMENT  2000   LEFT CIRCUMFLEX CORONARY  . INGUINAL HERNIA REPAIR    . REPLACEMENT TOTAL KNEE    . TOTAL KNEE ARTHROPLASTY Left 09/19/2014   Procedure: LEFT TOTAL KNEE ARTHROPLASTY;   Surgeon: Sydnee Cabal, MD;  Location: [facility];  Service: Orthopedics;  Laterality: Left;     Current Outpatient Medications  Medication Sig Dispense Refill  . amLODipine (NORVASC) 5 MG tablet Take 1 tablet (5 mg total) by mouth daily. 180 tablet 3  . aspirin EC 81 MG tablet Take 81 mg by mouth daily before supper.    . Glucosamine-Chondroitin (OSTEO BI-FLEX REGULAR STRENGTH PO) Take 2 tablets by mouth 2 (two) times daily.     . hydrochlorothiazide (HYDRODIURIL) 25 MG tablet Take 0.5 tablets (12.5 mg total) by mouth daily. 30 tablet 11  . Loteprednol Etabonate (LOTEMAX) 0.5 % GEL Place 1 drop into the left eye daily.    . metoprolol succinate (TOPROL XL) 50 MG 24 hr tablet Take 1 tablet (50 mg total) by mouth daily. Take with or immediately following a meal. 90 tablet 2  . Multiple Vitamin (MULTIVITAMIN WITH MINERALS) TABS tablet Take 1 tablet by mouth daily. Centrum Silver    . olmesartan (BENICAR) 40 MG tablet Take 1 tablet (40 mg total) by mouth daily. 90 tablet 3  . Omega-3 Fatty Acids (FISH OIL) 1200 MG CAPS Take 1,200 mg by mouth daily.    . polyethylene glycol (MIRALAX / GLYCOLAX) packet Take  17 g by mouth daily. Mix with 8 oz liquid and drink    . rosuvastatin (CRESTOR) 20 MG tablet Take 20 mg by mouth daily.    . Travoprost, BAK Free, (TRAVATAN) 0.004 % SOLN ophthalmic solution Place 1 drop into the left eye at bedtime.    . Zinc 50 MG CAPS Take 50 mg by mouth daily with supper.     Marland Kitchen acyclovir (ZOVIRAX) 800 MG tablet Take 800 mg by mouth daily.     . benzonatate (TESSALON) 100 MG capsule Take 1 capsule (100 mg total) by mouth every 8 (eight) hours. 21 capsule 0  . fluticasone (FLONASE) 50 MCG/ACT nasal spray Place 2 sprays into both nostrils daily as needed.     . traMADol (ULTRAM) 50 MG tablet Take 1 tablet (50 mg total) by mouth every 6 (six) hours as needed. 15 tablet 0   No current facility-administered medications for this visit.     Allergies:   Atorvastatin; Oysters  [shellfish allergy]; and Sulfa antibiotics    Social History:  The patient  reports that he quit smoking about 28 years ago. He has a 25.00 pack-year smoking history. he has never used smokeless tobacco. He reports that he does not drink alcohol or use drugs.   Family History:  The patient's family history includes Cancer in his father and mother.    ROS:  Please see the history of present illness.   Otherwise, review of systems are positive for none.   All other systems are reviewed and negative.    PHYSICAL EXAM: VS:  BP 136/72   Pulse (!) 50   Ht 6\' 1"  (1.854 m)   Wt 196 lb (88.9 kg)   BMI 25.86 kg/m  , BMI Body mass index is 25.86 kg/m. GENERAL:  Well appearing WM in NAD HEENT:  PERRL, EOMI, sclera are clear. Oropharynx is clear. NECK:  No jugular venous distention, carotid upstroke brisk and symmetric, no bruits, no thyromegaly or adenopathy LUNGS:  Clear to auscultation bilaterally CHEST:  Unremarkable HEART:  RRR,  PMI not displaced or sustained,S1 and S2 within normal limits, no S3, no S4: no clicks, no rubs, no murmurs ABD:  Soft, nontender. BS +, no masses or bruits. No hepatomegaly, no splenomegaly EXT:  2 + pulses throughout, no edema, no cyanosis no clubbing SKIN:  Warm and dry.  No rashes NEURO:  Alert and oriented x 3. Cranial nerves II through XII intact. PSYCH:  Cognitively intact     EKG:  EKG is not ordered today.    Recent Labs: No results found for requested labs within last 8760 hours.    Lipid Panel    Component Value Date/Time   CHOL 136 06/24/2013 0908   TRIG 106.0 06/24/2013 0908   HDL 38.80 (L) 06/24/2013 0908   CHOLHDL 4 06/24/2013 0908   VLDL 21.2 06/24/2013 0908   LDLCALC 76 06/24/2013 0908      Wt Readings from Last 3 Encounters:  08/21/17 196 lb (88.9 kg)  03/26/17 198 lb (89.8 kg)  02/20/17 193 lb 3.2 oz (87.6 kg)      Other studies Reviewed: Additional studies/ records that were reviewed today include:  November 2017.  Cholesterol 164, triglycerides 300, HDL 32 LDL 83. A1c 6.7%. Dated 12/28/16: glucose 150, A1c 7%. Cholesterol 94, triglyderdes 198, HDL 30, LDL 38. Other chemistries normal.     ASSESSMENT AND PLAN:  1. Coronary disease status post remote stenting of the left circumflex in 2000 with a bare-metal stent.  Cath results in 2016.  Patent stents in LCx. New disease in diagonal and ostial nondominant RCA  that correlate with stress test results. He is asymptomatic. Continue with his medical therapy.   2. Hypertension, well controlled. Continue current therapy  3. Hyperlipidemia- cholesterol well controlled.   4. Diabetes mellitus. Reviewed recommendations for low carb diet and weight loss. If sugars to not improve he will need to go on oral therapy.   Current medicines are reviewed at length with the patient today.  The patient does not have concerns regarding medicines.  The following changes have been made:  See above.  Labs/ tests ordered today include: none   No orders of the defined types were placed in this encounter.    Disposition:   FU with me in 6 months   Signed, Soloman Mckeithan [name], MD  08/21/2017 9:54 AM    [facility] [address], [place], [place]  [address] Phone: [phone]; Fax: [phone]

## 2017-08-21 ENCOUNTER — Ambulatory Visit: Payer: Medicare Other | Admitting: Cardiology

## 2017-08-21 ENCOUNTER — Encounter: Payer: Self-pay | Admitting: Cardiology

## 2017-08-21 VITALS — BP 136/72 | HR 50 | Ht 73.0 in | Wt 196.0 lb

## 2017-08-21 DIAGNOSIS — I2510 Atherosclerotic heart disease of native coronary artery without angina pectoris: Secondary | ICD-10-CM

## 2017-08-21 DIAGNOSIS — I10 Essential (primary) hypertension: Secondary | ICD-10-CM | POA: Diagnosis not present

## 2017-08-21 DIAGNOSIS — E782 Mixed hyperlipidemia: Secondary | ICD-10-CM

## 2017-08-21 DIAGNOSIS — E119 Type 2 diabetes mellitus without complications: Secondary | ICD-10-CM | POA: Insufficient documentation

## 2017-08-21 NOTE — Patient Instructions (Signed)
Work on reducing sweets and simple carbohydrate intake  Continue your other therapy  I will see you in 6 months

## 2017-11-06 ENCOUNTER — Other Ambulatory Visit: Payer: Self-pay | Admitting: Cardiology

## 2017-11-06 MED ORDER — OLMESARTAN MEDOXOMIL 40 MG PO TABS: 40.0000 mg | ORAL_TABLET | Freq: Every day | ORAL | 2 refills | Status: DC

## 2017-11-06 NOTE — Telephone Encounter (Signed)
New message     *STAT* If patient is at the pharmacy, call can be transferred to refill team.   1. Which medications need to be refilled? (please list name of each medication and dose if known) amLODipine (NORVASC) 5 MG tablet and olmesartan (BENICAR) 40 MG tablet  2. Which pharmacy/location (including street and city if local pharmacy) is medication to be sent [place], [place] [facility] [place]  3. Do they need a 30 day or 90 day supply? [place]

## 2017-11-06 NOTE — Telephone Encounter (Signed)
Rx(s) sent to pharmacy electronically.  

## 2017-11-09 ENCOUNTER — Telehealth: Payer: Self-pay | Admitting: Cardiology

## 2017-11-09 MED ORDER — AMLODIPINE BESYLATE 5 MG PO TABS: 5.0000 mg | ORAL_TABLET | Freq: Every day | ORAL | 3 refills | Status: DC

## 2017-11-09 NOTE — Telephone Encounter (Signed)
New Message:      *STAT* If patient is at the pharmacy, call can be transferred to refill team.   1. Which medications need to be refilled? (please list name of each medication and dose if known) amLODipine (NORVASC) 5 MG tablet  2. Which pharmacy/location (including street and city if local pharmacy) is medication to be sent [place] [place]  3. Do they need a 30 day or 90 day supply? [place]

## 2018-01-08 ENCOUNTER — Other Ambulatory Visit: Payer: Self-pay | Admitting: Cardiology

## 2018-01-08 MED ORDER — METOPROLOL SUCCINATE ER 50 MG PO TB24: 50.0000 mg | ORAL_TABLET | Freq: Every day | ORAL | 2 refills | Status: DC

## 2018-01-08 NOTE — Telephone Encounter (Signed)
Pt's wife is calling requesting a refill on metoprolol succinate 50 mg tablet. Wife stated that this medication was not sent in with the rest of the medications that was requested. Please address

## 2018-01-08 NOTE — Telephone Encounter (Signed)
Rx(s) sent to pharmacy electronically.  

## 2018-03-23 NOTE — Progress Notes (Signed)
Cardiology Office Note    Date:  03/29/2018   ID:  John Gamble, DOB 1931/05/05, MRN [id number]  PCP:  Algis Greenhouse, MD  Cardiologist:  Bhakti Labella [name], MD   Chief Complaint  Patient presents with  . Follow-up    6 months  . Coronary Artery Disease      History of Present Illness: John Gamble is a 84 y.o. male who presents for follow up CAD. He has a history of coronary disease and is status post stenting of the left circumflex coronary in 2000. He had a normal stress Myoview in July of 2011. He also has a history of hypertension and hyperlipidemia.   In September 2016 he had a nuclear stress test that was abnormal. This led to a cardiac cath.. Stents were patent but he did have ostial RCA (nondominant) and diagonal disease. Since he was asymptomatic medical therapy was recommended.   He continues to do very well from a cardiac standpoint without chest pain or SOB. He goes to the gym 5 days a week. He does note some lightheadedness at times.  He is able to ride a bike for one hour (16 miles). His BP has been well controlled. He notes his A1c improved from 7.5 to 6.5%. Marland Kitchen    Past Medical History:  Diagnosis Date  . Coronary artery disease   . Hyperlipidemia   . Hypertension   . MI, old   . OA (osteoarthritis)   . Pneumonia    hx of 2013     Past Surgical History:  Procedure Laterality Date  . CARDIAC CATHETERIZATION  01/26/1999   EF 55%  . CARDIAC CATHETERIZATION N/A 05/06/2015   Procedure: Left Heart Cath and Coronary Angiography;  Surgeon: Cowan Pilar M [name], MD;  Location: [facility];  Service: Cardiovascular;  Laterality: N/A;  . CARDIOVASCULAR STRESS TEST  02/22/2010   EF 61%  . corneal implant     left  . CORONARY STENT PLACEMENT  2000   LEFT CIRCUMFLEX CORONARY  . INGUINAL HERNIA REPAIR    . REPLACEMENT TOTAL KNEE    . TOTAL KNEE ARTHROPLASTY Left 09/19/2014   Procedure: LEFT TOTAL KNEE ARTHROPLASTY;  Surgeon: Sydnee Cabal, MD;  Location: [facility];   Service: Orthopedics;  Laterality: Left;     Current Outpatient Medications  Medication Sig Dispense Refill  . acyclovir (ZOVIRAX) 800 MG tablet Take 800 mg by mouth daily.     Marland Kitchen amLODipine (NORVASC) 5 MG tablet Take 1 tablet (5 mg total) by mouth daily. 90 tablet 3  . aspirin EC 81 MG tablet Take 81 mg by mouth daily before supper.    . benzonatate (TESSALON) 100 MG capsule Take 1 capsule (100 mg total) by mouth every 8 (eight) hours. 21 capsule 0  . fluticasone (FLONASE) 50 MCG/ACT nasal spray Place 2 sprays into both nostrils daily as needed.     . Glucosamine-Chondroitin (OSTEO BI-FLEX REGULAR STRENGTH PO) Take 2 tablets by mouth 2 (two) times daily.     . hydrochlorothiazide (HYDRODIURIL) 25 MG tablet Take 0.5 tablets (12.5 mg total) by mouth daily. 30 tablet 11  . Loteprednol Etabonate (LOTEMAX) 0.5 % GEL Place 1 drop into the left eye daily.    . metoprolol succinate (TOPROL XL) 50 MG 24 hr tablet Take 1 tablet (50 mg total) by mouth daily. Take with or immediately following a meal. (Patient taking differently: Take 25 mg by mouth 2 (two) times daily. Take with or immediately following a  meal.) 90 tablet 2  . Multiple Vitamin (MULTIVITAMIN WITH MINERALS) TABS tablet Take 1 tablet by mouth daily. Centrum Silver    . olmesartan (BENICAR) 40 MG tablet Take 1 tablet (40 mg total) by mouth daily. 90 tablet 2  . Omega-3 Fatty Acids (FISH OIL) 1200 MG CAPS Take 1,200 mg by mouth daily.    . polyethylene glycol (MIRALAX / GLYCOLAX) packet Take 17 g by mouth daily. Mix with 8 oz liquid and drink    . rosuvastatin (CRESTOR) 20 MG tablet Take 20 mg by mouth daily.    . traMADol (ULTRAM) 50 MG tablet Take 1 tablet (50 mg total) by mouth every 6 (six) hours as needed. 15 tablet 0  . Travoprost, BAK Free, (TRAVATAN) 0.004 % SOLN ophthalmic solution Place 1 drop into the left eye at bedtime.    . Zinc 50 MG CAPS Take 50 mg by mouth daily with supper.      No current facility-administered medications  for this visit.     Allergies:   Atorvastatin; Oysters [shellfish allergy]; and Sulfa antibiotics    Social History:  The patient  reports that he quit smoking about 29 years ago. He has a 25.00 pack-year smoking history. He has never used smokeless tobacco. He reports that he does not drink alcohol or use drugs.   Family History:  The patient's family history includes Cancer in his father and mother.    ROS:  Please see the history of present illness.   Otherwise, review of systems are positive for none.   All other systems are reviewed and negative.    PHYSICAL EXAM: VS:  BP (!) 110/58 (BP Location: Left Arm, Patient Position: Sitting, Cuff Size: Normal)   Pulse (!) 43   Ht 6' (1.829 m)   Wt 191 lb (86.6 kg)   BMI 25.90 kg/m  , BMI Body mass index is 25.9 kg/m. GENERAL:  Well appearing WM in NAD HEENT:  PERRL, EOMI, sclera are clear. Oropharynx is clear. NECK:  No jugular venous distention, carotid upstroke brisk and symmetric, no bruits, no thyromegaly or adenopathy LUNGS:  Clear to auscultation bilaterally CHEST:  Unremarkable HEART:  RRR,  PMI not displaced or sustained,S1 and S2 within normal limits, no S3, no S4: no clicks, no rubs, no murmurs ABD:  Soft, nontender. BS +, no masses or bruits. No hepatomegaly, no splenomegaly EXT:  2 + pulses throughout, no edema, no cyanosis no clubbing SKIN:  Warm and dry.  No rashes NEURO:  Alert and oriented x 3. Cranial nerves II through XII intact. PSYCH:  Cognitively intact   EKG:  EKG is  ordered today. Sinus brady rate 43. Otherwise normal. I have personally reviewed and interpreted this study.  Recent Labs: No results found for requested labs within last 8760 hours.    Lipid Panel    Component Value Date/Time   CHOL 136 06/24/2013 0908   TRIG 106.0 06/24/2013 0908   HDL 38.80 (L) 06/24/2013 0908   CHOLHDL 4 06/24/2013 0908   VLDL 21.2 06/24/2013 0908   LDLCALC 76 06/24/2013 0908      Wt Readings from Last 3  Encounters:  03/29/18 191 lb (86.6 kg)  08/21/17 196 lb (88.9 kg)  03/26/17 198 lb (89.8 kg)      Other studies Reviewed: Additional studies/ records that were reviewed today include:  November 2017. Cholesterol 164, triglycerides 300, HDL 32 LDL 83. A1c 6.7%. Dated 12/28/16: glucose 150, A1c 7%. Cholesterol 94, triglyderdes 198, HDL 30, LDL  38. Other chemistries normal.  Dated 07/06/17. Glucose 155, otherwise CMET normal. Cholesterol 109, triglycerides 164, HDL 35, LDL 59.  A1c 6.5%.   ASSESSMENT AND PLAN:  1. Coronary disease status post remote stenting of the left circumflex in 2000 with a bare-metal stent. Cath results in 2016.  Patent stents in LCx. New disease in diagonal and ostial nondominant RCA  that correlate with stress test results. He is asymptomatic. Continue with his medical therapy.   2. Hypertension, well controlled. Continue current therapy  3. Hyperlipidemia- cholesterol well controlled.   4. Diabetes mellitus. Reviewed recommendations for low carb diet and weight loss.  5. Sinus bradycardia. Will reduce Toprol to 25 mg daily. I would like to see HR > 55. If still slow may need to stop Toprol altogether.    Current medicines are reviewed at length with the patient today.  The patient does not have concerns regarding medicines.  The following changes have been made:  See above.  Labs/ tests ordered today include: none   No orders of the defined types were placed in this encounter.    Disposition:   FU with me in 6 months   Signed, Kajsa Butrum [name], MD  03/29/2018 9:53 AM    [facility] [place], [place], [place]  [address] Phone: [phone]; Fax: [phone]

## 2018-03-29 ENCOUNTER — Ambulatory Visit: Payer: Medicare Other | Admitting: Cardiology

## 2018-03-29 ENCOUNTER — Encounter: Payer: Self-pay | Admitting: Cardiology

## 2018-03-29 VITALS — BP 110/58 | HR 43 | Ht 72.0 in | Wt 191.0 lb

## 2018-03-29 DIAGNOSIS — I2510 Atherosclerotic heart disease of native coronary artery without angina pectoris: Secondary | ICD-10-CM | POA: Diagnosis not present

## 2018-03-29 DIAGNOSIS — I10 Essential (primary) hypertension: Secondary | ICD-10-CM

## 2018-03-29 DIAGNOSIS — E782 Mixed hyperlipidemia: Secondary | ICD-10-CM

## 2018-03-29 MED ORDER — METOPROLOL SUCCINATE ER 25 MG PO TB24: 25.0000 mg | ORAL_TABLET | Freq: Every day | ORAL | 11 refills | Status: DC

## 2018-03-29 NOTE — Patient Instructions (Signed)
Reduce Toprol XL to 25 mg once a day. If HR stays less than 55 we should stop it altogether.  Continue your other therapy

## 2018-04-17 ENCOUNTER — Telehealth: Payer: Self-pay | Admitting: Cardiology

## 2018-04-17 NOTE — Telephone Encounter (Signed)
Agree with stopping metoprolol  Ymani Porcher [name] MD, [facility]

## 2018-04-17 NOTE — Telephone Encounter (Signed)
Spoke with patient and he did decrease the Metoprolol as recommended by Dr [name] at office visit. Patient heart rate remained below 55 so he stopped altogether. HR now running from 57-69. Patient would like for Dr [name] to review. Will forward to Dr [name]

## 2018-04-17 NOTE — Telephone Encounter (Signed)
New message   Pt c/o medication issue:  1. Name of Medication: metoprolol succinate (TOPROL-XL) 25 MG 24 hr tablet  2. How are you currently taking this medication (dosage and times per day)? 1 tablet daily  3. Are you having a reaction (difficulty breathing--STAT)? No   4. What is your medication issue? Patient's wife wants to know if this medication needs to be decreased or if her husband needs to take at all.

## 2018-04-17 NOTE — Telephone Encounter (Signed)
Returned to call to patient Dr.Jordan agreed with stopping metoprolol.

## 2018-05-16 ENCOUNTER — Telehealth: Payer: Self-pay | Admitting: Cardiology

## 2018-05-16 NOTE — Telephone Encounter (Signed)
New message:       *STAT* If patient is at the pharmacy, call can be transferred to refill team.   1. Which medications need to be refilled? (please list name of each medication and dose if known) hydrochlorothiazide (HYDRODIURIL) 25 MG tablet  2. Which pharmacy/location (including street and city if local pharmacy) is medication to be sent [place], [place]  3. Do they need a 30 day or 90 day supply? [place]

## 2018-05-17 MED ORDER — HYDROCHLOROTHIAZIDE 25 MG PO TABS: 12.5000 mg | ORAL_TABLET | Freq: Every day | ORAL | 5 refills | Status: DC

## 2018-08-30 ENCOUNTER — Encounter: Payer: Self-pay | Admitting: Cardiology

## 2018-09-23 NOTE — Progress Notes (Signed)
Cardiology Office Note    Date:  09/24/2018   ID:  John Gamble, DOB [birth year], MRN [id number]  PCP:  Algis Greenhouse, MD  Cardiologist:  Larenzo Caples [name], MD   Chief Complaint  Patient presents with  . Coronary Artery Disease      History of Present Illness: John Gamble is a 85 y.o. male who presents for follow up CAD. He has a history of coronary disease and is status post stenting of the left circumflex coronary in 2000. He had a normal stress Myoview in July of 2011. He also has a history of hypertension and hyperlipidemia.   In September 2016 he had a nuclear stress test that was abnormal. This led to a cardiac cath.. Stents were patent but he did have ostial RCA (nondominant) and diagonal disease. Since he was asymptomatic medical therapy was recommended.   He continues to do very well from a cardiac standpoint. He denies any chest pain, SOB, palpitations, dizziness. He is off Toprol now due to bradycardia. He monitors his BP daily and it is well controlled. Goes to the gym 5 days a week and rides a bike for 30 minutes and walks on the treadmill for 20 minutes. He does note some balance problems ever since he had his knees replaced.   Past Medical History:  Diagnosis Date  . Coronary artery disease   . Hyperlipidemia   . Hypertension   . MI, old   . OA (osteoarthritis)   . Pneumonia    hx of 2013     Past Surgical History:  Procedure Laterality Date  . CARDIAC CATHETERIZATION  01/26/1999   EF 55%  . CARDIAC CATHETERIZATION N/A 05/06/2015   Procedure: Left Heart Cath and Coronary Angiography;  Surgeon: Sivan Quast M [name], MD;  Location: [place];  Service: Cardiovascular;  Laterality: N/A;  . CARDIOVASCULAR STRESS TEST  02/22/2010   EF 61%  . corneal implant     left  . CORONARY STENT PLACEMENT  2000   LEFT CIRCUMFLEX CORONARY  . INGUINAL HERNIA REPAIR    . REPLACEMENT TOTAL KNEE    . TOTAL KNEE ARTHROPLASTY Left 09/19/2014   Procedure: LEFT TOTAL KNEE  ARTHROPLASTY;  Surgeon: Sydnee Cabal, MD;  Location: [facility];  Service: Orthopedics;  Laterality: Left;     Current Outpatient Medications  Medication Sig Dispense Refill  . acyclovir (ZOVIRAX) 800 MG tablet Take 800 mg by mouth daily.     Marland Kitchen amLODipine (NORVASC) 5 MG tablet Take 1 tablet (5 mg total) by mouth daily. 90 tablet 3  . aspirin EC 81 MG tablet Take 81 mg by mouth daily before supper.    . Glucosamine-Chondroitin (OSTEO BI-FLEX REGULAR STRENGTH PO) Take 2 tablets by mouth 2 (two) times daily.     . hydrochlorothiazide (HYDRODIURIL) 25 MG tablet Take 0.5 tablets (12.5 mg total) by mouth daily. 30 tablet 5  . Loteprednol Etabonate (LOTEMAX) 0.5 % GEL Place 1 drop into the left eye daily.    . Multiple Vitamin (MULTIVITAMIN WITH MINERALS) TABS tablet Take 1 tablet by mouth daily. Centrum Silver    . olmesartan (BENICAR) 40 MG tablet Take 1 tablet (40 mg total) by mouth daily. 90 tablet 2  . Omega-3 Fatty Acids (FISH OIL) 1200 MG CAPS Take 1,200 mg by mouth daily.    . polyethylene glycol (MIRALAX / GLYCOLAX) packet Take 17 g by mouth daily. Mix with 8 oz liquid and drink    . rosuvastatin (CRESTOR) 20  MG tablet Take 20 mg by mouth daily.    . traMADol (ULTRAM) 50 MG tablet Take 1 tablet (50 mg total) by mouth every 6 (six) hours as needed. 15 tablet 0  . Travoprost, BAK Free, (TRAVATAN) 0.004 % SOLN ophthalmic solution Place 1 drop into the left eye at bedtime.    . Zinc 50 MG CAPS Take 50 mg by mouth daily with supper.      No current facility-administered medications for this visit.     Allergies:   Atorvastatin; Oysters [shellfish allergy]; and Sulfa antibiotics    Social History:  The patient  reports that he quit smoking about 29 years ago. He has a 25.00 pack-year smoking history. He has never used smokeless tobacco. He reports that he does not drink alcohol or use drugs.   Family History:  The patient's family history includes Cancer in his father and mother.    ROS:   Please see the history of present illness.   Otherwise, review of systems are positive for none.   All other systems are reviewed and negative.    PHYSICAL EXAM: VS:  BP 132/68 (BP Location: Left Arm)   Pulse 74   Ht 6' (1.829 m)   Wt 191 lb 9.6 oz (86.9 kg)   SpO2 96%   BMI 25.99 kg/m  , BMI Body mass index is 25.99 kg/m. GENERAL:  Well appearing WM in NAD HEENT:  PERRL, EOMI, sclera are clear. Oropharynx is clear. NECK:  No jugular venous distention, carotid upstroke brisk and symmetric, no bruits, no thyromegaly or adenopathy LUNGS:  Clear to auscultation bilaterally CHEST:  Unremarkable HEART:  RRR,  PMI not displaced or sustained,S1 and S2 within normal limits, no S3, no S4: no clicks, no rubs, no murmurs ABD:  Soft, nontender. BS +, no masses or bruits. No hepatomegaly, no splenomegaly EXT:  2 + pulses throughout, no edema, no cyanosis no clubbing SKIN:  Warm and dry.  No rashes NEURO:  Alert and oriented x 3. Cranial nerves II through XII intact. PSYCH:  Cognitively intact     EKG:  EKG is not  ordered today.   Recent Labs: No results found for requested labs within last 8760 hours.    Lipid Panel    Component Value Date/Time   CHOL 136 06/24/2013 0908   TRIG 106.0 06/24/2013 0908   HDL 38.80 (L) 06/24/2013 0908   CHOLHDL 4 06/24/2013 0908   VLDL 21.2 06/24/2013 0908   LDLCALC 76 06/24/2013 0908      Wt Readings from Last 3 Encounters:  09/24/18 191 lb 9.6 oz (86.9 kg)  03/29/18 191 lb (86.6 kg)  08/21/17 196 lb (88.9 kg)      Other studies Reviewed: Additional studies/ records that were reviewed today include:  November 2017. Cholesterol 164, triglycerides 300, HDL 32 LDL 83. A1c 6.7%. Dated 12/28/16: glucose 150, A1c 7%. Cholesterol 94, triglyderdes 198, HDL 30, LDL 38. Other chemistries normal.  Dated 07/06/17. Glucose 155, otherwise CMET normal. Cholesterol 109, triglycerides 164, HDL 35, LDL 59.  A1c 6.5%. Dated 07/17/18: cholesterol 108,  triglycerides 167, HDL 38, LDL 55. Glucose 142. Otherwise CMET normal. A1c 6.7%   ASSESSMENT AND PLAN:  1. Coronary disease status post remote stenting of the left circumflex in 2000 with a bare-metal stent. Cath results in 2016.  Patent stents in LCx. New disease in diagonal and ostial nondominant RCA  that correlate with stress test results. He remains asymptomatic. Continue with  medical therapy.   2.  Hypertension, well controlled. Continue current therapy  3. Hyperlipidemia- cholesterol well controlled. Triglycerides mildly elevated probable related to sugar.  4. Diabetes mellitus. A1c 6.7% and stable. Reviewed recommendations for low carb diet and weight loss. Per Dr. Garlon Hatchet.  5. Sinus bradycardia. Resolved off Toprol..    Current medicines are reviewed at length with the patient today.  The patient does not have concerns regarding medicines.  The following changes have been made:  See above.  Labs/ tests ordered today include: none   No orders of the defined types were placed in this encounter.    Disposition:   FU with me in 6 months   Signed, Bethany Cumming [name], MD  09/24/2018 11:26 AM    [facility] [place], [place], [place]  [address] Phone: [phone]; Fax: [phone]

## 2018-09-24 ENCOUNTER — Ambulatory Visit: Payer: Medicare Other | Admitting: Cardiology

## 2018-09-24 ENCOUNTER — Encounter (INDEPENDENT_AMBULATORY_CARE_PROVIDER_SITE_OTHER): Payer: Self-pay

## 2018-09-24 ENCOUNTER — Encounter: Payer: Self-pay | Admitting: Cardiology

## 2018-09-24 VITALS — BP 132/68 | HR 74 | Ht 72.0 in | Wt 191.6 lb

## 2018-09-24 DIAGNOSIS — I10 Essential (primary) hypertension: Secondary | ICD-10-CM | POA: Diagnosis not present

## 2018-09-24 DIAGNOSIS — I2510 Atherosclerotic heart disease of native coronary artery without angina pectoris: Secondary | ICD-10-CM

## 2018-09-24 DIAGNOSIS — E782 Mixed hyperlipidemia: Secondary | ICD-10-CM | POA: Diagnosis not present

## 2018-11-13 ENCOUNTER — Telehealth: Payer: Self-pay | Admitting: Cardiology

## 2018-11-13 MED ORDER — AMLODIPINE BESYLATE 5 MG PO TABS: 5.0000 mg | ORAL_TABLET | Freq: Every day | ORAL | 0 refills | Status: DC

## 2018-11-13 MED ORDER — OLMESARTAN MEDOXOMIL 40 MG PO TABS: 40.0000 mg | ORAL_TABLET | Freq: Every day | ORAL | 0 refills | Status: DC

## 2018-11-13 NOTE — Telephone Encounter (Signed)
Wife of the pt is waiting to hear about medications. The drugstore       ([place], [place]) has contacted Dr. Doug Sou office via fax but has gotten no response.      *STAT* If patient is at the pharmacy, call can be transferred to refill team.   1. Which medications need to be refilled? (please list name of each medication and dose if known)  amLODipine (NORVASC) 5 MG tablet olmesartan (BENICAR) 40 MG tablet  2. Which pharmacy/location (including street and city if local pharmacy) is medication to be sent to? [place]   3. Do they need a 30 day or 90 day supply? [place]

## 2019-02-15 ENCOUNTER — Other Ambulatory Visit: Payer: Self-pay | Admitting: Cardiology

## 2019-02-15 MED ORDER — AMLODIPINE BESYLATE 5 MG PO TABS: 5.0000 mg | ORAL_TABLET | Freq: Every day | ORAL | 0 refills | Status: DC

## 2019-02-15 MED ORDER — OLMESARTAN MEDOXOMIL 40 MG PO TABS: 40.0000 mg | ORAL_TABLET | Freq: Every day | ORAL | 0 refills | Status: DC

## 2019-04-14 NOTE — Progress Notes (Signed)
Cardiology Office Note    Date:  04/16/2019   ID:  John Gamble, DOB Aug 01, 1931, MRN [id number]  PCP:  Algis Greenhouse, MD  Cardiologist:  Yelitza Reach [name], MD   Chief Complaint  Patient presents with  . Coronary Artery Disease      History of Present Illness: EYAS MORON is a 85 y.o. male who presents for follow up CAD. He has a history of coronary disease and is status post stenting of the left circumflex coronary in 2000. He had a normal stress Myoview in July of 2011. He also has a history of hypertension and hyperlipidemia.   In September 2016 he had a nuclear stress test that was abnormal. This led to a cardiac cath.. Stents were patent but he did have ostial RCA (nondominant) and diagonal disease. Since he was asymptomatic medical therapy was recommended.   He continues to do very well from a cardiac standpoint. He denies any chest pain, SOB, palpitations.  He is off Toprol now due to bradycardia. He monitors his BP daily and it is well controlled. Walks 5 days a week for 45 minutes. About 3 months [place] track and decided to walk an extra lap. He fell flat on his face. Had a CT done without acute findings. No dizziness since then.   Past Medical History:  Diagnosis Date  . Coronary artery disease   . Hyperlipidemia   . Hypertension   . MI, old   . OA (osteoarthritis)   . Pneumonia    hx of 2013     Past Surgical History:  Procedure Laterality Date  . CARDIAC CATHETERIZATION  01/26/1999   EF 55%  . CARDIAC CATHETERIZATION N/A 05/06/2015   Procedure: Left Heart Cath and Coronary Angiography;  Surgeon: Margeaux Swantek M [name], MD;  Location: [place];  Service: Cardiovascular;  Laterality: N/A;  . CARDIOVASCULAR STRESS TEST  02/22/2010   EF 61%  . corneal implant     left  . CORONARY STENT PLACEMENT  2000   LEFT CIRCUMFLEX CORONARY  . INGUINAL HERNIA REPAIR    . REPLACEMENT TOTAL KNEE    . TOTAL KNEE ARTHROPLASTY Left 09/19/2014   Procedure: LEFT  TOTAL KNEE ARTHROPLASTY;  Surgeon: Sydnee Cabal, MD;  Location: [facility];  Service: Orthopedics;  Laterality: Left;     Current Outpatient Medications  Medication Sig Dispense Refill  . acyclovir (ZOVIRAX) 800 MG tablet Take 800 mg by mouth daily.     Marland Kitchen amLODipine (NORVASC) 5 MG tablet Take 1 tablet (5 mg total) by mouth daily. 90 tablet 0  . aspirin EC 81 MG tablet Take 81 mg by mouth daily before supper.    . Glucosamine-Chondroitin (OSTEO BI-FLEX REGULAR STRENGTH PO) Take 2 tablets by mouth 2 (two) times daily.     . hydrochlorothiazide (HYDRODIURIL) 25 MG tablet Take 0.5 tablets (12.5 mg total) by mouth daily. 30 tablet 5  . Loteprednol Etabonate (LOTEMAX) 0.5 % GEL Place 1 drop into the left eye daily.    . Multiple Vitamin (MULTIVITAMIN WITH MINERALS) TABS tablet Take 1 tablet by mouth daily. Centrum Silver    . olmesartan (BENICAR) 40 MG tablet Take 1 tablet (40 mg total) by mouth daily. 90 tablet 0  . Omega-3 Fatty Acids (FISH OIL) 1200 MG CAPS Take 1,200 mg by mouth daily.    . polyethylene glycol (MIRALAX / GLYCOLAX) packet Take 17 g by mouth daily. Mix with 8 oz liquid and drink    .  rosuvastatin (CRESTOR) 20 MG tablet Take 20 mg by mouth daily.    . traMADol (ULTRAM) 50 MG tablet Take 1 tablet (50 mg total) by mouth every 6 (six) hours as needed. 15 tablet 0  . Travoprost, BAK Free, (TRAVATAN) 0.004 % SOLN ophthalmic solution Place 1 drop into the left eye at bedtime.    . Zinc 50 MG CAPS Take 50 mg by mouth daily with supper.      No current facility-administered medications for this visit.     Allergies:   Atorvastatin, Oysters [shellfish allergy], and Sulfa antibiotics    Social History:  The patient  reports that he quit smoking about 30 years ago. He has a 25.00 pack-year smoking history. He has never used smokeless tobacco. He reports that he does not drink alcohol or use drugs.   Family History:  The patient's family history includes Cancer in his father and mother.     ROS:  Please see the history of present illness.   Otherwise, review of systems are positive for none.   All other systems are reviewed and negative.    PHYSICAL EXAM: VS:  BP 140/82   Pulse 76   Ht 6\' 1"  (1.854 m)   Wt 188 lb (85.3 kg)   SpO2 96%   BMI 24.80 kg/m  , BMI Body mass index is 24.8 kg/m. GENERAL:  Well appearing WM in NAD HEENT:  PERRL, EOMI, sclera are clear. Oropharynx is clear. NECK:  No jugular venous distention, carotid upstroke brisk and symmetric, no bruits, no thyromegaly or adenopathy LUNGS:  Clear to auscultation bilaterally CHEST:  Unremarkable HEART:  RRR,  PMI not displaced or sustained,S1 and S2 within normal limits, no S3, no S4: no clicks, no rubs, no murmurs ABD:  Soft, nontender. BS +, no masses or bruits. No hepatomegaly, no splenomegaly EXT:  2 + pulses throughout, no edema, no cyanosis no clubbing SKIN:  Warm and dry.  No rashes NEURO:  Alert and oriented x 3. Cranial nerves II through XII intact. PSYCH:  Cognitively intact     EKG:  EKG is ordered today. NSR rate 76. Incomplete RBBB. Cannot rule out inferior infarct. I have personally reviewed and interpreted this study.   Recent Labs: No results found for requested labs within last 8760 hours.    Lipid Panel    Component Value Date/Time   CHOL 136 06/24/2013 0908   TRIG 106.0 06/24/2013 0908   HDL 38.80 (L) 06/24/2013 0908   CHOLHDL 4 06/24/2013 0908   VLDL 21.2 06/24/2013 0908   LDLCALC 76 06/24/2013 0908      Wt Readings from Last 3 Encounters:  04/16/19 188 lb (85.3 kg)  09/24/18 191 lb 9.6 oz (86.9 kg)  03/29/18 191 lb (86.6 kg)      Other studies Reviewed: Additional studies/ records that were reviewed today include:  November 2017. Cholesterol 164, triglycerides 300, HDL 32 LDL 83. A1c 6.7%. Dated 12/28/16: glucose 150, A1c 7%. Cholesterol 94, triglyderdes 198, HDL 30, LDL 38. Other chemistries normal.  Dated 07/06/17. Glucose 155, otherwise CMET normal. Cholesterol  109, triglycerides 164, HDL 35, LDL 59.  A1c 6.5%. Dated 07/17/18: cholesterol 108, triglycerides 167, HDL 38, LDL 55. Glucose 142. Otherwise CMET normal. A1c 6.7%   ASSESSMENT AND PLAN:  1. Coronary disease status post remote stenting of the left circumflex in 2000 with a bare-metal stent. Cath results in 2016.  Patent stents in LCx. New disease in diagonal and ostial nondominant RCA  that correlate with  stress test results. He remains asymptomatic. Continue with  medical therapy.   2. Hypertension, well controlled. Continue HCTZ and olmesartan.   3. Hyperlipidemia- cholesterol well controlled. Triglycerides improved.  4. Diabetes mellitus. A1c 6.7% and stable. Reviewed recommendations for low carb diet and weight loss. Per Dr. Garlon Hatchet.  5. Sinus bradycardia. Resolved off Toprol..    Current medicines are reviewed at length with the patient today.  The patient does not have concerns regarding medicines.  The following changes have been made:  See above.  Labs/ tests ordered today include: none   No orders of the defined types were placed in this encounter.    Disposition:   FU with me in 6 months   Signed, Kionna Brier [name], MD  04/16/2019 4:01 PM    [facility] [place], [place], [place]  [address] Phone: [phone]; Fax: [phone]

## 2019-04-16 ENCOUNTER — Other Ambulatory Visit: Payer: Self-pay

## 2019-04-16 ENCOUNTER — Encounter: Payer: Self-pay | Admitting: Cardiology

## 2019-04-16 ENCOUNTER — Ambulatory Visit (INDEPENDENT_AMBULATORY_CARE_PROVIDER_SITE_OTHER): Payer: Medicare Other | Admitting: Cardiology

## 2019-04-16 VITALS — BP 140/82 | HR 76 | Ht 73.0 in | Wt 188.0 lb

## 2019-04-16 DIAGNOSIS — I10 Essential (primary) hypertension: Secondary | ICD-10-CM | POA: Diagnosis not present

## 2019-04-16 DIAGNOSIS — I2510 Atherosclerotic heart disease of native coronary artery without angina pectoris: Secondary | ICD-10-CM | POA: Diagnosis not present

## 2019-04-16 DIAGNOSIS — E782 Mixed hyperlipidemia: Secondary | ICD-10-CM | POA: Diagnosis not present

## 2019-05-07 ENCOUNTER — Other Ambulatory Visit: Payer: Self-pay | Admitting: Cardiology

## 2019-05-07 NOTE — Telephone Encounter (Signed)
New Message   *STAT* If patient is at the pharmacy, call can be transferred to refill team.   1. Which medications need to be refilled? (please list name of each medication and dose if known) olmesartan (BENICAR) 40 MG tablet   hydrochlorothiazide (HYDRODIURIL) 25 MG tablet  2. Which pharmacy/location (including street and city if local pharmacy) is medication to be sent to [place], [place]  3. Do they need a 30 day or 90 day supply? 90 day

## 2019-05-08 ENCOUNTER — Other Ambulatory Visit: Payer: Self-pay | Admitting: *Deleted

## 2019-05-08 MED ORDER — OLMESARTAN MEDOXOMIL 40 MG PO TABS: 40.0000 mg | ORAL_TABLET | Freq: Every day | ORAL | 1 refills | Status: DC

## 2019-05-08 MED ORDER — HYDROCHLOROTHIAZIDE 25 MG PO TABS: 12.5000 mg | ORAL_TABLET | Freq: Every day | ORAL | 1 refills | Status: DC

## 2019-05-08 NOTE — Telephone Encounter (Signed)
Requested Prescriptions   Signed Prescriptions Disp Refills  . hydrochlorothiazide (HYDRODIURIL) 25 MG tablet 45 tablet 1    Sig: Take 0.5 tablets (12.5 mg total) by mouth daily.    Authorizing Provider: [name], PETER M    Ordering User: Britt Bottom olmesartan (BENICAR) 40 MG tablet 90 tablet 1    Sig: Take 1 tablet (40 mg total) by mouth daily.    Authorizing Provider: [name], PETER M    Ordering User: Britt Bottom

## 2019-05-10 NOTE — Telephone Encounter (Signed)
Wife called and states that the pharmacy still does not have this refill request.  The patient only has one day left of medication.

## 2019-05-10 NOTE — Telephone Encounter (Signed)
Returned call to pr wife she states that she will call [place], [place] and see if this is filled. She will CB if they do not have these rx's

## 2019-05-27 ENCOUNTER — Other Ambulatory Visit: Payer: Self-pay | Admitting: Cardiology

## 2019-05-27 MED ORDER — AMLODIPINE BESYLATE 5 MG PO TABS: 5.0000 mg | ORAL_TABLET | Freq: Every day | ORAL | 0 refills | Status: DC

## 2019-05-27 NOTE — Telephone Encounter (Signed)
Medications sent to pharmacy

## 2019-05-27 NOTE — Telephone Encounter (Signed)
New Message     *STAT* If patient is at the pharmacy, call can be transferred to refill team.   1. Which medications need to be refilled? (please list name of each medication and dose if known) Amlodipine  2. Which pharmacy/location (including street and city if local pharmacy) is medication to be sent to? [facility]  3. Do they need a 30 day or 90 day supply? 90 day supply

## 2019-08-22 ENCOUNTER — Telehealth: Payer: Self-pay | Admitting: Cardiology

## 2019-08-22 NOTE — Telephone Encounter (Signed)
New Message   *STAT* If patient is at the pharmacy, call can be transferred to refill team.   1. Which medications need to be refilled? (please list name of each medication and dose if known) amLODipine (NORVASC) 5 MG tablet  2. Which pharmacy/location (including street and city if local pharmacy) is medication to be sent to? [place], [place]  3. Do they need a 30 day or 90 day supply? 90 day

## 2019-08-23 MED ORDER — AMLODIPINE BESYLATE 5 MG PO TABS: 5.0000 mg | ORAL_TABLET | Freq: Every day | ORAL | 2 refills | Status: DC

## 2019-08-23 NOTE — Telephone Encounter (Signed)
Pt's medication was sent to pt's pharmacy as requested. Confirmation received.  °

## 2019-10-17 NOTE — Progress Notes (Signed)
Cardiology Office Note    Date:  10/23/2019   ID:  John Gamble, DOB 1931/01/05, MRN [id number]  PCP:  Algis Greenhouse, MD  Cardiologist:  Shadeed Colberg [name], MD   Chief Complaint  Patient presents with  . Atrial Fibrillation      History of Present Illness: John Gamble is a 86 y.o. male who presents for follow up CAD. He has a history of coronary disease and is status post stenting of the left circumflex coronary in 2000. He had a normal stress Myoview in July of 2011. He also has a history of hypertension and hyperlipidemia.   In September 2016 he had a nuclear stress test that was abnormal. This led to a cardiac cath.. Stents were patent but he did have ostial RCA (nondominant) and diagonal disease. Since he was asymptomatic medical therapy was recommended.   He did have Covid 19 infection in January 2021. He was admitted to [facility] with hypoxia and bilateral PNA. Treated with oxygen, steroids and Remdisivir. He is better now. Back going to the gym 3 days a week. Still coughs up a little phlegm.BP has been excellent. Dr Garlon Hatchet recommended holding HCTZ. He denies any chest pain or edema. Breathing is OK now.    Past Medical History:  Diagnosis Date  . Coronary artery disease   . Hyperlipidemia   . Hypertension   . MI, old   . OA (osteoarthritis)   . Pneumonia    hx of 2013     Past Surgical History:  Procedure Laterality Date  . CARDIAC CATHETERIZATION  01/26/1999   EF 55%  . CARDIAC CATHETERIZATION N/A 05/06/2015   Procedure: Left Heart Cath and Coronary Angiography;  Surgeon: Jashley Yellin M [name], MD;  Location: [facility];  Service: Cardiovascular;  Laterality: N/A;  . CARDIOVASCULAR STRESS TEST  02/22/2010   EF 61%  . corneal implant     left  . CORONARY STENT PLACEMENT  2000   LEFT CIRCUMFLEX CORONARY  . INGUINAL HERNIA REPAIR    . REPLACEMENT TOTAL KNEE    . TOTAL KNEE ARTHROPLASTY Left 09/19/2014   Procedure: LEFT TOTAL KNEE ARTHROPLASTY;  Surgeon: Sydnee Cabal,  MD;  Location: [facility];  Service: Orthopedics;  Laterality: Left;     Current Outpatient Medications  Medication Sig Dispense Refill  . acyclovir (ZOVIRAX) 800 MG tablet Take 800 mg by mouth daily.     Marland Kitchen amLODipine (NORVASC) 5 MG tablet Take 1 tablet (5 mg total) by mouth daily. 90 tablet 2  . aspirin EC 81 MG tablet Take 81 mg by mouth daily before supper.    . Glucosamine-Chondroitin (OSTEO BI-FLEX REGULAR STRENGTH PO) Take 2 tablets by mouth 2 (two) times daily.     . Loteprednol Etabonate (LOTEMAX) 0.5 % GEL Place 1 drop into the left eye daily.    . Multiple Vitamin (MULTIVITAMIN WITH MINERALS) TABS tablet Take 1 tablet by mouth daily. Centrum Silver    . olmesartan (BENICAR) 40 MG tablet Take 1 tablet (40 mg total) by mouth daily. 90 tablet 1  . Omega-3 Fatty Acids (FISH OIL) 1200 MG CAPS Take 1,200 mg by mouth daily.    . polyethylene glycol (MIRALAX / GLYCOLAX) packet Take 17 g by mouth daily. Mix with 8 oz liquid and drink    . rosuvastatin (CRESTOR) 20 MG tablet Take 20 mg by mouth daily.    . traMADol (ULTRAM) 50 MG tablet Take 1 tablet (50 mg total) by mouth every 6 (six)  hours as needed. 15 tablet 0  . Travoprost, BAK Free, (TRAVATAN) 0.004 % SOLN ophthalmic solution Place 1 drop into the left eye at bedtime.    . Zinc 50 MG CAPS Take 50 mg by mouth daily with supper.     . metFORMIN (GLUCOPHAGE-XR) 500 MG 24 hr tablet Take 500 mg by mouth daily.     No current facility-administered medications for this visit.    Allergies:   Atorvastatin, Oysters [shellfish allergy], and Sulfa antibiotics    Social History:  The patient  reports that he quit smoking about 30 years ago. He has a 25.00 pack-year smoking history. He has never used smokeless tobacco. He reports that he does not drink alcohol or use drugs.   Family History:  The patient's family history includes Cancer in his father and mother.    ROS:  Please see the history of present illness.   Otherwise, review of systems  are positive for none.   All other systems are reviewed and negative.    PHYSICAL EXAM: VS:  BP 140/70   Pulse 74   Ht 6\' 1"  (1.854 m)   Wt 183 lb (83 kg)   SpO2 98%   BMI 24.14 kg/m  , BMI Body mass index is 24.14 kg/m. GENERAL:  Well appearing WM in NAD HEENT:  PERRL, EOMI, sclera are clear. Oropharynx is clear. NECK:  No jugular venous distention, carotid upstroke brisk and symmetric, no bruits, no thyromegaly or adenopathy LUNGS:  Clear to auscultation bilaterally CHEST:  Unremarkable HEART:  RRR,  PMI not displaced or sustained,S1 and S2 within normal limits, no S3, no S4: no clicks, no rubs, no murmurs ABD:  Soft, nontender. BS +, no masses or bruits. No hepatomegaly, no splenomegaly EXT:  2 + pulses throughout, no edema, no cyanosis no clubbing SKIN:  Warm and dry.  No rashes NEURO:  Alert and oriented x 3. Cranial nerves II through XII intact. PSYCH:  Cognitively intact     EKG:  EKG is not ordered today.    Recent Labs: No results found for requested labs within last 8760 hours.    Lipid Panel    Component Value Date/Time   CHOL 136 06/24/2013 0908   TRIG 106.0 06/24/2013 0908   HDL 38.80 (L) 06/24/2013 0908   CHOLHDL 4 06/24/2013 0908   VLDL 21.2 06/24/2013 0908   LDLCALC 76 06/24/2013 0908      Wt Readings from Last 3 Encounters:  10/23/19 183 lb (83 kg)  04/16/19 188 lb (85.3 kg)  09/24/18 191 lb 9.6 oz (86.9 kg)      Other studies Reviewed: Additional studies/ records that were reviewed today include:  November 2017. Cholesterol 164, triglycerides 300, HDL 32 LDL 83. A1c 6.7%. Dated 12/28/16: glucose 150, A1c 7%. Cholesterol 94, triglyderdes 198, HDL 30, LDL 38. Other chemistries normal.  Dated 07/06/17. Glucose 155, otherwise CMET normal. Cholesterol 109, triglycerides 164, HDL 35, LDL 59.  A1c 6.5%. Dated 07/17/18: cholesterol 108, triglycerides 167, HDL 38, LDL 55. Glucose 142. Otherwise CMET normal. A1c 6.7% Dated 07/22/19: cholesterol 103,  triglycerides 187, HDL 36, LDL 53.  Dated 08/17/19: glucose 147. Otherwise CMET normal. Hgb 13.9.   ASSESSMENT AND PLAN:  1. Coronary disease status post remote stenting of the left circumflex in 2000 with a bare-metal stent. Cath results in 2016.  Patent stents in LCx. New disease in diagonal and ostial nondominant RCA  that correlate with stress test results. He remains asymptomatic. Continue with  medical therapy.  2. Hypertension, well controlled. Continue amlodipine  and olmesartan. HCTZ on hold  3. Hyperlipidemia- cholesterol well controlled. Triglycerides improved.  4. Diabetes mellitus. A1c 6.7% and stable. Reviewed recommendations for low carb diet and weight loss. Per Dr. Garlon Hatchet.  5. Sinus bradycardia. Resolved off Toprol..   6. Covid 19 infection in January with PNA. Clinically improved.    Current medicines are reviewed at length with the patient today.  The patient does not have concerns regarding medicines.  The following changes have been made:  See above.  Labs/ tests ordered today include: none   No orders of the defined types were placed in this encounter.    Disposition:   FU with me in 6 months   Signed, Qiara Minetti [name], MD  10/23/2019 11:34 AM    [facility] [place], [place], [place]  [address] Phone: [phone]; Fax: [phone]

## 2019-10-23 ENCOUNTER — Ambulatory Visit: Payer: Medicare Other | Admitting: Cardiology

## 2019-10-23 ENCOUNTER — Other Ambulatory Visit: Payer: Self-pay

## 2019-10-23 ENCOUNTER — Encounter: Payer: Self-pay | Admitting: Cardiology

## 2019-10-23 VITALS — BP 140/70 | HR 74 | Ht 73.0 in | Wt 183.0 lb

## 2019-10-23 DIAGNOSIS — I2510 Atherosclerotic heart disease of native coronary artery without angina pectoris: Secondary | ICD-10-CM | POA: Diagnosis not present

## 2019-10-23 DIAGNOSIS — E782 Mixed hyperlipidemia: Secondary | ICD-10-CM | POA: Diagnosis not present

## 2019-10-23 DIAGNOSIS — I10 Essential (primary) hypertension: Secondary | ICD-10-CM | POA: Diagnosis not present

## 2019-11-06 ENCOUNTER — Other Ambulatory Visit: Payer: Self-pay | Admitting: Cardiology

## 2019-11-06 NOTE — Telephone Encounter (Signed)
Rx request sent to pharmacy.  

## 2020-04-18 NOTE — Progress Notes (Signed)
Cardiology Office Note    Date:  04/23/2020   ID:  ABSHIR PAOLINI, DOB 1931/02/26, MRN [id number]  PCP:  Algis Greenhouse, MD  Cardiologist:  Dontez Hauss [name], MD   Chief Complaint  Patient presents with   Coronary Artery Disease      History of Present Illness: KASHUS KARLEN is a 86 y.o. male who presents for follow up CAD. He has a history of coronary disease and is status post stenting of the left circumflex coronary in 2000. He had a normal stress Myoview in July of 2011. He also has a history of hypertension and hyperlipidemia.   In September 2016 he had a nuclear stress test that was abnormal. This led to a cardiac cath.. Stents were patent but he did have ostial RCA (nondominant) and diagonal disease. Since he was asymptomatic medical therapy was recommended.   He did have Covid 19 infection in January 2021. He was admitted to [facility] with hypoxia and bilateral PNA.  He has no residual symptoms. . he brings a diary of his BP readings and his BP has been excellent. He denies any chest pain or edema. No SOB.    Past Medical History:  Diagnosis Date   Coronary artery disease    Hyperlipidemia    Hypertension    MI, old    OA (osteoarthritis)    Pneumonia    hx of 2013     Past Surgical History:  Procedure Laterality Date   CARDIAC CATHETERIZATION  01/26/1999   EF 55%   CARDIAC CATHETERIZATION N/A 05/06/2015   Procedure: Left Heart Cath and Coronary Angiography;  Surgeon: Jarah Pember M [name], MD;  Location: [facility];  Service: Cardiovascular;  Laterality: N/A;   CARDIOVASCULAR STRESS TEST  02/22/2010   EF 61%   corneal implant     left   CORONARY STENT PLACEMENT  2000   LEFT CIRCUMFLEX CORONARY   INGUINAL HERNIA REPAIR     REPLACEMENT TOTAL KNEE     TOTAL KNEE ARTHROPLASTY Left 09/19/2014   Procedure: LEFT TOTAL KNEE ARTHROPLASTY;  Surgeon: Sydnee Cabal, MD;  Location: [facility];  Service: Orthopedics;  Laterality: Left;     Current Outpatient  Medications  Medication Sig Dispense Refill   acyclovir (ZOVIRAX) 800 MG tablet Take 800 mg by mouth daily.      amLODipine (NORVASC) 5 MG tablet Take 1 tablet (5 mg total) by mouth daily. 90 tablet 2   aspirin EC 81 MG tablet Take 81 mg by mouth daily before supper.     Glucosamine-Chondroitin (OSTEO BI-FLEX REGULAR STRENGTH PO) Take 2 tablets by mouth 2 (two) times daily.      Loteprednol Etabonate (LOTEMAX) 0.5 % GEL Place 1 drop into the left eye daily.     metFORMIN (GLUCOPHAGE-XR) 500 MG 24 hr tablet Take 500 mg by mouth daily.     Multiple Vitamin (MULTIVITAMIN WITH MINERALS) TABS tablet Take 1 tablet by mouth daily. Centrum Silver     olmesartan (BENICAR) 40 MG tablet TAKE ONE (1) TABLET ONCE DAILY 90 tablet 1   Omega-3 Fatty Acids (FISH OIL) 1200 MG CAPS Take 1,200 mg by mouth daily.     polyethylene glycol (MIRALAX / GLYCOLAX) packet Take 17 g by mouth daily. Mix with 8 oz liquid and drink     rosuvastatin (CRESTOR) 20 MG tablet Take 20 mg by mouth daily.     Travoprost, BAK Free, (TRAVATAN) 0.004 % SOLN ophthalmic solution Place 1 drop into the left  eye at bedtime.     Zinc 50 MG CAPS Take 50 mg by mouth daily with supper.      No current facility-administered medications for this visit.    Allergies:   Atorvastatin, Oysters [shellfish allergy], and Sulfa antibiotics    Social History:  The patient  reports that he quit smoking about 31 years ago. He has a 25.00 pack-year smoking history. He has never used smokeless tobacco. He reports that he does not drink alcohol and does not use drugs.   Family History:  The patient's family history includes Cancer in his father and mother.    ROS:  Please see the history of present illness.   Otherwise, review of systems are positive for none.   All other systems are reviewed and negative.    PHYSICAL EXAM: VS:  BP (!) 150/60    Pulse 68    Temp (!) 96.4 F (35.8 C)    Ht 6\' 1"  (1.854 m)    Wt 185 lb 3.2 oz (84 kg)    SpO2  97%    BMI 24.43 kg/m  , BMI Body mass index is 24.43 kg/m. GENERAL:  Well appearing WM in NAD HEENT:  PERRL, EOMI, sclera are clear. Oropharynx is clear. NECK:  No jugular venous distention, carotid upstroke brisk and symmetric, no bruits, no thyromegaly or adenopathy LUNGS:  Clear to auscultation bilaterally CHEST:  Unremarkable HEART:  RRR,  PMI not displaced or sustained,S1 and S2 within normal limits, no S3, no S4: no clicks, no rubs, no murmurs ABD:  Soft, nontender. BS +, no masses or bruits. No hepatomegaly, no splenomegaly EXT:  2 + pulses throughout, no edema, no cyanosis no clubbing SKIN:  Warm and dry.  No rashes NEURO:  Alert and oriented x 3. Cranial nerves II through XII intact. PSYCH:  Cognitively intact     EKG:  EKG is  ordered today. NSR with occ. PVC. Otherwise normal. I have personally reviewed and interpreted this study.    Recent Labs: No results found for requested labs within last 8760 hours.    Lipid Panel    Component Value Date/Time   CHOL 136 06/24/2013 0908   TRIG 106.0 06/24/2013 0908   HDL 38.80 (L) 06/24/2013 0908   CHOLHDL 4 06/24/2013 0908   VLDL 21.2 06/24/2013 0908   LDLCALC 76 06/24/2013 0908      Wt Readings from Last 3 Encounters:  04/23/20 185 lb 3.2 oz (84 kg)  10/23/19 183 lb (83 kg)  04/16/19 188 lb (85.3 kg)      Other studies Reviewed: Additional studies/ records that were reviewed today include:  November 2017. Cholesterol 164, triglycerides 300, HDL 32 LDL 83. A1c 6.7%. Dated 12/28/16: glucose 150, A1c 7%. Cholesterol 94, triglyderdes 198, HDL 30, LDL 38. Other chemistries normal.  Dated 07/06/17. Glucose 155, otherwise CMET normal. Cholesterol 109, triglycerides 164, HDL 35, LDL 59.  A1c 6.5%. Dated 07/17/18: cholesterol 108, triglycerides 167, HDL 38, LDL 55. Glucose 142. Otherwise CMET normal. A1c 6.7% Dated 07/22/19: cholesterol 103, triglycerides 187, HDL 36, LDL 53.  Dated 08/17/19: glucose 147. Otherwise CMET  normal. Hgb 13.9.  Dated 01/23/20: Glucose 127. Otherwise CMET normal.  ASSESSMENT AND PLAN:  1. Coronary disease status post remote stenting of the left circumflex in 2000 with a bare-metal stent. Cath  in 2016 showed  Patent stents in LCx. New disease in diagonal and ostial nondominant RCA  that correlate with stress test results. He remains asymptomatic. Continue with  medical  therapy.   2. Hypertension, well controlled. Continue amlodipine  and olmesartan.  3. Hyperlipidemia- cholesterol well controlled on Crestor.   4. Diabetes mellitus.  Reviewed recommendations for low carb diet and weight loss. Per Dr. Garlon Hatchet.  5. Sinus bradycardia. Improved off Toprol..     Current medicines are reviewed at length with the patient today.  The patient does not have concerns regarding medicines.  The following changes have been made:  See above.  Labs/ tests ordered today include: none   No orders of the defined types were placed in this encounter.    Disposition:   FU with me in 6 months   Signed, Dayyan Krist [name], MD  04/23/2020 10:26 AM    [facility], [place], [place]  [address] Phone: [phone]; Fax: [phone]

## 2020-04-23 ENCOUNTER — Other Ambulatory Visit: Payer: Self-pay

## 2020-04-23 ENCOUNTER — Ambulatory Visit: Payer: Medicare Other | Admitting: Cardiology

## 2020-04-23 ENCOUNTER — Encounter: Payer: Self-pay | Admitting: Cardiology

## 2020-04-23 VITALS — BP 150/60 | HR 68 | Temp 96.4°F | Ht 73.0 in | Wt 185.2 lb

## 2020-04-23 DIAGNOSIS — I10 Essential (primary) hypertension: Secondary | ICD-10-CM | POA: Diagnosis not present

## 2020-04-23 DIAGNOSIS — E782 Mixed hyperlipidemia: Secondary | ICD-10-CM

## 2020-04-23 DIAGNOSIS — I2510 Atherosclerotic heart disease of native coronary artery without angina pectoris: Secondary | ICD-10-CM | POA: Diagnosis not present

## 2020-05-08 ENCOUNTER — Other Ambulatory Visit: Payer: Self-pay | Admitting: Cardiology

## 2020-05-08 NOTE — Telephone Encounter (Signed)
Rx has been sent to the pharmacy electronically. ° °

## 2020-06-04 ENCOUNTER — Other Ambulatory Visit: Payer: Self-pay | Admitting: Cardiology

## 2020-10-02 NOTE — H&P (View-Only) (Signed)
Cardiology Office Note    Date:  10/05/2020   ID:  John Gamble, DOB Sep 14, 1930, MRN [id number]  PCP:  John Greenhouse, MD  Cardiologist:  John Pavlak [name], MD   Chief Complaint  Patient presents with  . Coronary Artery Disease      History of Present Illness: John Gamble is a 87 y.o. male who presents for follow up CAD. He has a history of coronary disease and is status post stenting of the left circumflex coronary in 2000. He had a normal stress Myoview in July of 2011. He also has a history of hypertension and hyperlipidemia.   In September 2016 he had a nuclear stress test that was abnormal. This led to a cardiac cath.. Stents were patent but he did have ostial RCA (nondominant) and diagonal disease. Since he was asymptomatic medical therapy was recommended.   He did have Covid 19 infection in January 2021. He was admitted to [facility] with hypoxia and bilateral PNA.  He brings a diary of his BP readings and his BP has been excellent. He notes that since his Covid infection his energy is not as good and he has more dyspnea on exertion. Before he was able to ride his exercise bike for 30 minutes straight. Now he has to stop a couple of times. No chest pain. Sometimes has a little left arm pain.    Past Medical History:  Diagnosis Date  . Coronary artery disease   . Hyperlipidemia   . Hypertension   . MI, old   . OA (osteoarthritis)   . Pneumonia    hx of 2013     Past Surgical History:  Procedure Laterality Date  . CARDIAC CATHETERIZATION  01/26/1999   EF 55%  . CARDIAC CATHETERIZATION N/A 05/06/2015   Procedure: Left Heart Cath and Coronary Angiography;  Surgeon: John Zuniga M [name], MD;  Location: [place] [facility];  Service: Cardiovascular;  Laterality: N/A;  . CARDIOVASCULAR STRESS TEST  02/22/2010   EF 61%  . corneal implant     left  . CORONARY STENT PLACEMENT  2000   LEFT CIRCUMFLEX CORONARY  . INGUINAL HERNIA REPAIR    . REPLACEMENT TOTAL KNEE    . TOTAL KNEE  ARTHROPLASTY Left 09/19/2014   Procedure: LEFT TOTAL KNEE ARTHROPLASTY;  Surgeon: John Cabal, MD;  Location: [facility];  Service: Orthopedics;  Laterality: Left;     Current Outpatient Medications  Medication Sig Dispense Refill  . acyclovir (ZOVIRAX) 800 MG tablet Take 800 mg by mouth daily.     Marland Kitchen amLODipine (NORVASC) 5 MG tablet TAKE ONE (1) TABLET ONCE DAILY 90 tablet 2  . aspirin EC 81 MG tablet Take 81 mg by mouth daily before supper.    . Glucosamine-Chondroitin (OSTEO BI-FLEX REGULAR STRENGTH PO) Take 2 tablets by mouth 2 (two) times daily.    Marland Kitchen glucose blood (ONETOUCH ULTRA) test strip USE 1 STRIP DAILY.    Marland Kitchen Lancets (ONETOUCH DELICA PLUS [id number]) MISC SMARTSIG:1 Unit(s) Topical Daily    . Loteprednol Etabonate 0.5 % GEL Place 1 drop into the left eye daily.    . metFORMIN (GLUCOPHAGE-XR) 500 MG 24 hr tablet Take 500 mg by mouth daily.    . Multiple Vitamin (MULTIVITAMIN WITH MINERALS) TABS tablet Take 1 tablet by mouth daily. Centrum Silver    . olmesartan (BENICAR) 40 MG tablet TAKE ONE (1) TABLET ONCE DAILY 90 tablet 2  . Omega-3 Fatty Acids (FISH OIL) 1200 MG CAPS Take 1,200 mg  by mouth daily.    Glory Rosebush ULTRA test strip daily.    . polyethylene glycol (MIRALAX / GLYCOLAX) packet Take 17 g by mouth daily. Mix with 8 oz liquid and drink    . prednisoLONE acetate (PRED FORTE) 1 % ophthalmic suspension Place 1 drop into the left eye daily.    . rosuvastatin (CRESTOR) 20 MG tablet Take 20 mg by mouth daily.    . Travoprost, BAK Free, (TRAVATAN) 0.004 % SOLN ophthalmic solution Place 1 drop into the left eye at bedtime.    . Zinc 50 MG CAPS Take 50 mg by mouth daily with supper.      No current facility-administered medications for this visit.    Allergies:   Atorvastatin, Oysters [shellfish allergy], and Sulfa antibiotics    Social History:  The patient  reports that he quit smoking about 31 years ago. He has a 25.00 pack-year smoking history. He has never used smokeless  tobacco. He reports that he does not drink alcohol and does not use drugs.   Family History:  The patient's family history includes Cancer in his father and mother.    ROS:  Please see the history of present illness.   Otherwise, review of systems are positive for none.   All other systems are reviewed and negative.    PHYSICAL EXAM: VS:  BP 130/74   Pulse 64   Ht 6\' 1"  (1.854 m)   Wt 184 lb (83.5 kg)   SpO2 97%   BMI 24.28 kg/m  , BMI Body mass index is 24.28 kg/m. GENERAL:  Well appearing WM in NAD HEENT:  PERRL, EOMI, sclera are clear. Oropharynx is clear. NECK:  No jugular venous distention, carotid upstroke brisk and symmetric, no bruits, no thyromegaly or adenopathy LUNGS:  Clear to auscultation bilaterally CHEST:  Unremarkable HEART:  RRR,  PMI not displaced or sustained,S1 and S2 within normal limits, no S3, no S4: no clicks, no rubs, no murmurs ABD:  Soft, nontender. BS +, no masses or bruits. No hepatomegaly, no splenomegaly EXT:  2 + pulses throughout, no edema, no cyanosis no clubbing SKIN:  Warm and dry.  No rashes NEURO:  Alert and oriented x 3. Cranial nerves II through XII intact. PSYCH:  Cognitively intact  EKG:  EKG is  ordered today. NSR with occ. PVC. Otherwise normal. I have personally reviewed and interpreted this study.    Recent Labs: No results found for requested labs within last 8760 hours.    Lipid Panel    Component Value Date/Time   CHOL 136 06/24/2013 0908   TRIG 106.0 06/24/2013 0908   HDL 38.80 (L) 06/24/2013 0908   CHOLHDL 4 06/24/2013 0908   VLDL 21.2 06/24/2013 0908   LDLCALC 76 06/24/2013 0908      Wt Readings from Last 3 Encounters:  10/05/20 184 lb (83.5 kg)  04/23/20 185 lb 3.2 oz (84 kg)  10/23/19 183 lb (83 kg)      Other studies Reviewed: Additional studies/ records that were reviewed today include:  November 2017. Cholesterol 164, triglycerides 300, HDL 32 LDL 83. A1c 6.7%. Dated 12/28/16: glucose 150, A1c 7%.  Cholesterol 94, triglyderdes 198, HDL 30, LDL 38. Other chemistries normal.  Dated 07/06/17. Glucose 155, otherwise CMET normal. Cholesterol 109, triglycerides 164, HDL 35, LDL 59.  A1c 6.5%. Dated 07/17/18: cholesterol 108, triglycerides 167, HDL 38, LDL 55. Glucose 142. Otherwise CMET normal. A1c 6.7% Dated 07/22/19: cholesterol 103, triglycerides 187, HDL 36, LDL 53.  Dated 08/17/19:  glucose 147. Otherwise CMET normal. Hgb 13.9.  Dated 01/23/20: Glucose 127. Otherwise CMET normal. Dated 07/27/20: glucose 165, otherwise CMET normal. Cholesterol 108, triglycerides 155, HDL 35, LDL 54. A1c 6.6%.   ASSESSMENT AND PLAN:  1. Coronary disease status post remote stenting of the left circumflex in 2000 with a bare-metal stent. Cath  in 2016 showed  Patent stents in LCx. New disease in diagonal and ostial nondominant RCA  that correlate with stress test results. He is experiencing some DOE since his Covid PNA. We discussed possible repeat stress testing to make sure this is not related to ischemia. He would like to hold off on this given his age. I do suspect that his symptoms are more related to his Covid infection.  Continue with  medical therapy.   2. Hypertension, well controlled. Continue amlodipine  and olmesartan.  3. Hyperlipidemia- cholesterol well controlled on Crestor.   4. Diabetes mellitus.  Reviewed recommendations for low carb diet and weight loss. Per Dr. Garlon Hatchet.  5. Sinus bradycardia. Improved off Toprol..     Current medicines are reviewed at length with the patient today.  The patient does not have concerns regarding medicines.  The following changes have been made:  See above.  Labs/ tests ordered today include: none   No orders of the defined types were placed in this encounter.    Disposition:   FU with me in 6 months   Signed, Kelechi Orgeron [name], MD  10/05/2020 11:05 AM    [facility] [place], [place], [place]  [address] Phone: [phone];  Fax: [phone]

## 2020-10-02 NOTE — Progress Notes (Signed)
Cardiology Office Note    Date:  10/05/2020   ID:  John Gamble, DOB Oct 01, 1930, MRN [id number]  PCP:  John Greenhouse, MD  Cardiologist:  John Strength [name], MD   Chief Complaint  Patient presents with  . Coronary Artery Disease      History of Present Illness: John Gamble is a 87 y.o. male who presents for follow up CAD. He has a history of coronary disease and is status post stenting of the left circumflex coronary in 2000. He had a normal stress Myoview in July of 2011. He also has a history of hypertension and hyperlipidemia.   In September 2016 he had a nuclear stress test that was abnormal. This led to a cardiac cath.. Stents were patent but he did have ostial RCA (nondominant) and diagonal disease. Since he was asymptomatic medical therapy was recommended.   He did have Covid 19 infection in January 2021. He was admitted to [facility] with hypoxia and bilateral PNA.  He brings a diary of his BP readings and his BP has been excellent. He notes that since his Covid infection his energy is not as good and he has more dyspnea on exertion. Before he was able to ride his exercise bike for 30 minutes straight. Now he has to stop a couple of times. No chest pain. Sometimes has a little left arm pain.    Past Medical History:  Diagnosis Date  . Coronary artery disease   . Hyperlipidemia   . Hypertension   . MI, old   . OA (osteoarthritis)   . Pneumonia    hx of 2013     Past Surgical History:  Procedure Laterality Date  . CARDIAC CATHETERIZATION  01/26/1999   EF 55%  . CARDIAC CATHETERIZATION N/A 05/06/2015   Procedure: Left Heart Cath and Coronary Angiography;  Surgeon: John Vosler M [name], MD;  Location: [place] [facility];  Service: Cardiovascular;  Laterality: N/A;  . CARDIOVASCULAR STRESS TEST  02/22/2010   EF 61%  . corneal implant     left  . CORONARY STENT PLACEMENT  2000   LEFT CIRCUMFLEX CORONARY  . INGUINAL HERNIA REPAIR    . REPLACEMENT TOTAL KNEE    . TOTAL KNEE  ARTHROPLASTY Left 09/19/2014   Procedure: LEFT TOTAL KNEE ARTHROPLASTY;  Surgeon: John Cabal, MD;  Location: [facility];  Service: Orthopedics;  Laterality: Left;     Current Outpatient Medications  Medication Sig Dispense Refill  . acyclovir (ZOVIRAX) 800 MG tablet Take 800 mg by mouth daily.     Marland Kitchen amLODipine (NORVASC) 5 MG tablet TAKE ONE (1) TABLET ONCE DAILY 90 tablet 2  . aspirin EC 81 MG tablet Take 81 mg by mouth daily before supper.    . Glucosamine-Chondroitin (OSTEO BI-FLEX REGULAR STRENGTH PO) Take 2 tablets by mouth 2 (two) times daily.    Marland Kitchen glucose blood (ONETOUCH ULTRA) test strip USE 1 STRIP DAILY.    Marland Kitchen Lancets (ONETOUCH DELICA PLUS [id number]) MISC SMARTSIG:1 Unit(s) Topical Daily    . Loteprednol Etabonate 0.5 % GEL Place 1 drop into the left eye daily.    . metFORMIN (GLUCOPHAGE-XR) 500 MG 24 hr tablet Take 500 mg by mouth daily.    . Multiple Vitamin (MULTIVITAMIN WITH MINERALS) TABS tablet Take 1 tablet by mouth daily. Centrum Silver    . olmesartan (BENICAR) 40 MG tablet TAKE ONE (1) TABLET ONCE DAILY 90 tablet 2  . Omega-3 Fatty Acids (FISH OIL) 1200 MG CAPS Take 1,200 mg  by mouth daily.    John Gamble ULTRA test strip daily.    . polyethylene glycol (MIRALAX / GLYCOLAX) packet Take 17 g by mouth daily. Mix with 8 oz liquid and drink    . prednisoLONE acetate (PRED FORTE) 1 % ophthalmic suspension Place 1 drop into the left eye daily.    . rosuvastatin (CRESTOR) 20 MG tablet Take 20 mg by mouth daily.    . Travoprost, BAK Free, (TRAVATAN) 0.004 % SOLN ophthalmic solution Place 1 drop into the left eye at bedtime.    . Zinc 50 MG CAPS Take 50 mg by mouth daily with supper.      No current facility-administered medications for this visit.    Allergies:   Atorvastatin, Oysters [shellfish allergy], and Sulfa antibiotics    Social History:  The patient  reports that he quit smoking about 31 years ago. He has a 25.00 pack-year smoking history. He has never used smokeless  tobacco. He reports that he does not drink alcohol and does not use drugs.   Family History:  The patient's family history includes Cancer in his father and mother.    ROS:  Please see the history of present illness.   Otherwise, review of systems are positive for none.   All other systems are reviewed and negative.    PHYSICAL EXAM: VS:  BP 130/74   Pulse 64   Ht 6\' 1"  (1.854 m)   Wt 184 lb (83.5 kg)   SpO2 97%   BMI 24.28 kg/m  , BMI Body mass index is 24.28 kg/m. GENERAL:  Well appearing WM in NAD HEENT:  PERRL, EOMI, sclera are clear. Oropharynx is clear. NECK:  No jugular venous distention, carotid upstroke brisk and symmetric, no bruits, no thyromegaly or adenopathy LUNGS:  Clear to auscultation bilaterally CHEST:  Unremarkable HEART:  RRR,  PMI not displaced or sustained,S1 and S2 within normal limits, no S3, no S4: no clicks, no rubs, no murmurs ABD:  Soft, nontender. BS +, no masses or bruits. No hepatomegaly, no splenomegaly EXT:  2 + pulses throughout, no edema, no cyanosis no clubbing SKIN:  Warm and dry.  No rashes NEURO:  Alert and oriented x 3. Cranial nerves II through XII intact. PSYCH:  Cognitively intact  EKG:  EKG is  ordered today. NSR with occ. PVC. Otherwise normal. I have personally reviewed and interpreted this study.    Recent Labs: No results found for requested labs within last 8760 hours.    Lipid Panel    Component Value Date/Time   CHOL 136 06/24/2013 0908   TRIG 106.0 06/24/2013 0908   HDL 38.80 (L) 06/24/2013 0908   CHOLHDL 4 06/24/2013 0908   VLDL 21.2 06/24/2013 0908   LDLCALC 76 06/24/2013 0908      Wt Readings from Last 3 Encounters:  10/05/20 184 lb (83.5 kg)  04/23/20 185 lb 3.2 oz (84 kg)  10/23/19 183 lb (83 kg)      Other studies Reviewed: Additional studies/ records that were reviewed today include:  November 2017. Cholesterol 164, triglycerides 300, HDL 32 LDL 83. A1c 6.7%. Dated 12/28/16: glucose 150, A1c 7%.  Cholesterol 94, triglyderdes 198, HDL 30, LDL 38. Other chemistries normal.  Dated 07/06/17. Glucose 155, otherwise CMET normal. Cholesterol 109, triglycerides 164, HDL 35, LDL 59.  A1c 6.5%. Dated 07/17/18: cholesterol 108, triglycerides 167, HDL 38, LDL 55. Glucose 142. Otherwise CMET normal. A1c 6.7% Dated 07/22/19: cholesterol 103, triglycerides 187, HDL 36, LDL 53.  Dated 08/17/19:  glucose 147. Otherwise CMET normal. Hgb 13.9.  Dated 01/23/20: Glucose 127. Otherwise CMET normal. Dated 07/27/20: glucose 165, otherwise CMET normal. Cholesterol 108, triglycerides 155, HDL 35, LDL 54. A1c 6.6%.   ASSESSMENT AND PLAN:  1. Coronary disease status post remote stenting of the left circumflex in 2000 with a bare-metal stent. Cath  in 2016 showed  Patent stents in LCx. New disease in diagonal and ostial nondominant RCA  that correlate with stress test results. He is experiencing some DOE since his Covid PNA. We discussed possible repeat stress testing to make sure this is not related to ischemia. He would like to hold off on this given his age. I do suspect that his symptoms are more related to his Covid infection.  Continue with  medical therapy.   2. Hypertension, well controlled. Continue amlodipine  and olmesartan.  3. Hyperlipidemia- cholesterol well controlled on Crestor.   4. Diabetes mellitus.  Reviewed recommendations for low carb diet and weight loss. Per Dr. Garlon Hatchet.  5. Sinus bradycardia. Improved off Toprol..     Current medicines are reviewed at length with the patient today.  The patient does not have concerns regarding medicines.  The following changes have been made:  See above.  Labs/ tests ordered today include: none   No orders of the defined types were placed in this encounter.    Disposition:   FU with me in 6 months   Signed, Naavya Postma [name], MD  10/05/2020 11:05 AM    [facility] [place], [place], [place]  [address] Phone: [phone];  Fax: [phone]

## 2020-10-02 NOTE — H&P (View-Only) (Signed)
Cardiology Office Note    Date:  10/05/2020   ID:  John Gamble, DOB 1931/01/21, MRN [id number]  PCP:  Algis Greenhouse, MD  Cardiologist:  Perri Lamagna [name], MD   Chief Complaint  Patient presents with  . Coronary Artery Disease      History of Present Illness: John John Gamble is a 87 y.o. male who presents for follow up CAD. He has a history of coronary disease and is status post stenting of the left circumflex coronary in 2000. He had a normal stress Myoview in July of 2011. He also has a history of hypertension and hyperlipidemia.   In September 2016 he had a nuclear stress test that was abnormal. This led to a cardiac cath.. Stents were patent but he did have ostial RCA (nondominant) and diagonal disease. Since he was asymptomatic medical therapy was recommended.   He did have Covid 19 infection in January 2021. He was admitted to [facility] with hypoxia and bilateral PNA.  He brings a diary of his BP readings and his BP has been excellent. He notes that since his Covid infection his energy is not as good and he has more dyspnea on exertion. Before he was able to ride his exercise bike for 30 minutes straight. Now he has to stop a couple of times. No chest pain. Sometimes has a little left arm pain.    Past Medical History:  Diagnosis Date  . Coronary artery disease   . Hyperlipidemia   . Hypertension   . MI, old   . OA (osteoarthritis)   . Pneumonia    hx of 2013     Past Surgical History:  Procedure Laterality Date  . CARDIAC CATHETERIZATION  01/26/1999   EF 55%  . CARDIAC CATHETERIZATION N/A 05/06/2015   Procedure: Left Heart Cath and Coronary Angiography;  Surgeon: John Gamble M [name], MD;  Location: [place] [facility];  Service: Cardiovascular;  Laterality: N/A;  . CARDIOVASCULAR STRESS TEST  02/22/2010   EF 61%  . corneal implant     left  . CORONARY STENT PLACEMENT  2000   LEFT CIRCUMFLEX CORONARY  . INGUINAL HERNIA REPAIR    . REPLACEMENT TOTAL KNEE    . TOTAL KNEE  ARTHROPLASTY Left 09/19/2014   Procedure: LEFT TOTAL KNEE ARTHROPLASTY;  Surgeon: John Cabal, MD;  Location: [facility];  Service: Orthopedics;  Laterality: Left;     Current Outpatient Medications  Medication Sig Dispense Refill  . acyclovir (ZOVIRAX) 800 MG tablet Take 800 mg by mouth daily.     Marland Kitchen amLODipine (NORVASC) 5 MG tablet TAKE ONE (1) TABLET ONCE DAILY 90 tablet 2  . aspirin EC 81 MG tablet Take 81 mg by mouth daily before supper.    . Glucosamine-Chondroitin (OSTEO BI-FLEX REGULAR STRENGTH PO) Take 2 tablets by mouth 2 (two) times daily.    Marland Kitchen glucose blood (ONETOUCH ULTRA) test strip USE 1 STRIP DAILY.    Marland Kitchen Lancets (ONETOUCH DELICA PLUS [id number]) MISC SMARTSIG:1 Unit(s) Topical Daily    . Loteprednol Etabonate 0.5 % GEL Place 1 drop into the left eye daily.    . metFORMIN (GLUCOPHAGE-XR) 500 MG 24 hr tablet Take 500 mg by mouth daily.    . Multiple Vitamin (MULTIVITAMIN WITH MINERALS) TABS tablet Take 1 tablet by mouth daily. Centrum Silver    . olmesartan (BENICAR) 40 MG tablet TAKE ONE (1) TABLET ONCE DAILY 90 tablet 2  . Omega-3 Fatty Acids (FISH OIL) 1200 MG CAPS Take 1,200 mg  by mouth daily.    Glory Rosebush ULTRA test strip daily.    . polyethylene glycol (MIRALAX / GLYCOLAX) packet Take 17 g by mouth daily. Mix with 8 oz liquid and drink    . prednisoLONE acetate (PRED FORTE) 1 % ophthalmic suspension Place 1 drop into the left eye daily.    . rosuvastatin (CRESTOR) 20 MG tablet Take 20 mg by mouth daily.    . Travoprost, BAK Free, (TRAVATAN) 0.004 % SOLN ophthalmic solution Place 1 drop into the left eye at bedtime.    . Zinc 50 MG CAPS Take 50 mg by mouth daily with supper.      No current facility-administered medications for this visit.    Allergies:   Atorvastatin, Oysters [shellfish allergy], and Sulfa antibiotics    Social History:  The patient  reports that he quit smoking about 31 years ago. He has a 25.00 pack-year smoking history. He has never used smokeless  tobacco. He reports that he does not drink alcohol and does not use drugs.   Family History:  The patient's family history includes Cancer in his father and mother.    ROS:  Please see the history of present illness.   Otherwise, review of systems are positive for none.   All other systems are reviewed and negative.    PHYSICAL EXAM: VS:  BP 130/74   Pulse 64   Ht 6\' 1"  (1.854 m)   Wt 184 lb (83.5 kg)   SpO2 97%   BMI 24.28 kg/m  , BMI Body mass index is 24.28 kg/m. GENERAL:  Well appearing WM in NAD HEENT:  PERRL, EOMI, sclera are clear. Oropharynx is clear. NECK:  No jugular venous distention, carotid upstroke brisk and symmetric, no bruits, no thyromegaly or adenopathy LUNGS:  Clear to auscultation bilaterally CHEST:  Unremarkable HEART:  RRR,  PMI not displaced or sustained,S1 and S2 within normal limits, no S3, no S4: no clicks, no rubs, no murmurs ABD:  Soft, nontender. BS +, no masses or bruits. No hepatomegaly, no splenomegaly EXT:  2 + pulses throughout, no edema, no cyanosis no clubbing SKIN:  Warm and dry.  No rashes NEURO:  Alert and oriented x 3. Cranial nerves II through XII intact. PSYCH:  Cognitively intact  EKG:  EKG is  ordered today. NSR with occ. PVC. Otherwise normal. I have personally reviewed and interpreted this study.    Recent Labs: No results found for requested labs within last 8760 hours.    Lipid Panel    Component Value Date/Time   CHOL 136 06/24/2013 0908   TRIG 106.0 06/24/2013 0908   HDL 38.80 (L) 06/24/2013 0908   CHOLHDL 4 06/24/2013 0908   VLDL 21.2 06/24/2013 0908   LDLCALC 76 06/24/2013 0908      Wt Readings from Last 3 Encounters:  10/05/20 184 lb (83.5 kg)  04/23/20 185 lb 3.2 oz (84 kg)  10/23/19 183 lb (83 kg)      Other studies Reviewed: Additional studies/ records that were reviewed today include:  November 2017. Cholesterol 164, triglycerides 300, HDL 32 LDL 83. A1c 6.7%. Dated 12/28/16: glucose 150, A1c 7%.  Cholesterol 94, triglyderdes 198, HDL 30, LDL 38. Other chemistries normal.  Dated 07/06/17. Glucose 155, otherwise CMET normal. Cholesterol 109, triglycerides 164, HDL 35, LDL 59.  A1c 6.5%. Dated 07/17/18: cholesterol 108, triglycerides 167, HDL 38, LDL 55. Glucose 142. Otherwise CMET normal. A1c 6.7% Dated 07/22/19: cholesterol 103, triglycerides 187, HDL 36, LDL 53.  Dated 08/17/19:  glucose 147. Otherwise CMET normal. Hgb 13.9.  Dated 01/23/20: Glucose 127. Otherwise CMET normal. Dated 07/27/20: glucose 165, otherwise CMET normal. Cholesterol 108, triglycerides 155, HDL 35, LDL 54. A1c 6.6%.   ASSESSMENT AND PLAN:  1. Coronary disease status post remote stenting of the left circumflex in 2000 with a bare-metal stent. Cath  in 2016 showed  Patent stents in LCx. New disease in diagonal and ostial nondominant RCA  that correlate with stress test results. He is experiencing some DOE since his Covid PNA. We discussed possible repeat stress testing to make sure this is not related to ischemia. He would like to hold off on this given his age. I do suspect that his symptoms are more related to his Covid infection.  Continue with  medical therapy.   2. Hypertension, well controlled. Continue amlodipine  and olmesartan.  3. Hyperlipidemia- cholesterol well controlled on Crestor.   4. Diabetes mellitus.  Reviewed recommendations for low carb diet and weight loss. Per Dr. Garlon Hatchet.  5. Sinus bradycardia. Improved off Toprol..     Current medicines are reviewed at length with the patient today.  The patient does not have concerns regarding medicines.  The following changes have been made:  See above.  Labs/ tests ordered today include: none   No orders of the defined types were placed in this encounter.    Disposition:   FU with me in 6 months   Signed, Chanique Duca [name], MD  10/05/2020 11:05 AM    [facility] [place], [place], [place]  [address] Phone: [phone];  Fax: [phone]

## 2020-10-05 ENCOUNTER — Ambulatory Visit: Payer: Medicare Other | Admitting: Cardiology

## 2020-10-05 ENCOUNTER — Other Ambulatory Visit: Payer: Self-pay

## 2020-10-05 ENCOUNTER — Encounter: Payer: Self-pay | Admitting: Cardiology

## 2020-10-05 VITALS — BP 130/74 | HR 64 | Ht 73.0 in | Wt 184.0 lb

## 2020-10-05 DIAGNOSIS — I2510 Atherosclerotic heart disease of native coronary artery without angina pectoris: Secondary | ICD-10-CM | POA: Diagnosis not present

## 2020-10-05 DIAGNOSIS — E782 Mixed hyperlipidemia: Secondary | ICD-10-CM

## 2020-10-05 DIAGNOSIS — I10 Essential (primary) hypertension: Secondary | ICD-10-CM | POA: Diagnosis not present

## 2020-10-05 DIAGNOSIS — R0600 Dyspnea, unspecified: Secondary | ICD-10-CM

## 2020-10-05 DIAGNOSIS — R0609 Other forms of dyspnea: Secondary | ICD-10-CM

## 2020-10-12 ENCOUNTER — Other Ambulatory Visit: Payer: Self-pay | Admitting: Cardiology

## 2020-10-12 ENCOUNTER — Telehealth: Payer: Self-pay | Admitting: Cardiology

## 2020-10-12 DIAGNOSIS — I2510 Atherosclerotic heart disease of native coronary artery without angina pectoris: Secondary | ICD-10-CM

## 2020-10-12 DIAGNOSIS — R0609 Other forms of dyspnea: Secondary | ICD-10-CM

## 2020-10-12 DIAGNOSIS — I25118 Atherosclerotic heart disease of native coronary artery with other forms of angina pectoris: Secondary | ICD-10-CM

## 2020-10-12 DIAGNOSIS — R0600 Dyspnea, unspecified: Secondary | ICD-10-CM

## 2020-10-12 NOTE — Telephone Encounter (Signed)
Spoke to wife Dr.Jordan's advice given.Scheduler will call back with [place] appointment.

## 2020-10-12 NOTE — Telephone Encounter (Signed)
We discussed doing a Lexicographer. History of CAD with symptoms of DOE.  Peter [name] MD, [facility]

## 2020-10-12 NOTE — Telephone Encounter (Signed)
Spoke with the patient's wife who states that the patient would like to have the stress test done that they discussed with Dr. [name] last week at his office visit. Advised that I will reach out to Dr. [name] for orders and our schedulers would call to set up an appointment.

## 2020-10-12 NOTE — Telephone Encounter (Signed)
Pt wife called in and stated that pt has decided to go head and have the stress test.  She would like to know if Dr [name] could order the test and sch it?      Best number [phone]

## 2020-10-14 ENCOUNTER — Telehealth (HOSPITAL_COMMUNITY): Payer: Self-pay | Admitting: *Deleted

## 2020-10-14 NOTE — Telephone Encounter (Signed)
Patient given detailed instructions per Myocardial Perfusion Study Information Sheet for the test on 10/21/20 at 1015. Patient notified to arrive 15 minutes early and that it is imperative to arrive on time for appointment to keep from having the test rescheduled.  If you need to cancel or reschedule your appointment, please call the office within 24 hours of your appointment. . Patient verbalized understanding.John Gamble, [name]

## 2020-10-21 ENCOUNTER — Other Ambulatory Visit: Payer: Self-pay

## 2020-10-21 ENCOUNTER — Ambulatory Visit (HOSPITAL_COMMUNITY): Payer: Medicare Other | Attending: Cardiovascular Disease

## 2020-10-21 VITALS — Ht 73.0 in | Wt 184.0 lb

## 2020-10-21 DIAGNOSIS — R0609 Other forms of dyspnea: Secondary | ICD-10-CM

## 2020-10-21 DIAGNOSIS — R0600 Dyspnea, unspecified: Secondary | ICD-10-CM | POA: Diagnosis not present

## 2020-10-21 DIAGNOSIS — I2510 Atherosclerotic heart disease of native coronary artery without angina pectoris: Secondary | ICD-10-CM | POA: Insufficient documentation

## 2020-10-21 DIAGNOSIS — I25118 Atherosclerotic heart disease of native coronary artery with other forms of angina pectoris: Secondary | ICD-10-CM | POA: Diagnosis present

## 2020-10-21 LAB — MYOCARDIAL PERFUSION IMAGING
LV dias vol: 202 mL (ref 62–150)
LV sys vol: 157 mL
Peak HR: 113 {beats}/min
Rest HR: 96 {beats}/min
SDS: 6
SRS: 10
SSS: 19
TID: 1.05

## 2020-10-21 MED ORDER — REGADENOSON 0.4 MG/5ML IV SOLN
0.4000 mg | Freq: Once | INTRAVENOUS | Status: AC
  Administered 2020-10-21: 0.4 mg via INTRAVENOUS

## 2020-10-21 MED ORDER — TECHNETIUM TC 99M TETROFOSMIN IV KIT
9.6000 | PACK | Freq: Once | INTRAVENOUS | Status: AC | PRN
  Administered 2020-10-21: 9.6 via INTRAVENOUS
  Filled 2020-10-21: qty 10

## 2020-10-21 MED ORDER — TECHNETIUM TC 99M TETROFOSMIN IV KIT
31.8000 | PACK | Freq: Once | INTRAVENOUS | Status: AC | PRN
  Administered 2020-10-21: 31.8 via INTRAVENOUS
  Filled 2020-10-21: qty 32

## 2020-10-22 ENCOUNTER — Other Ambulatory Visit: Payer: Self-pay

## 2020-10-22 ENCOUNTER — Telehealth: Payer: Self-pay

## 2020-10-22 DIAGNOSIS — Z01812 Encounter for preprocedural laboratory examination: Secondary | ICD-10-CM

## 2020-10-22 DIAGNOSIS — I10 Essential (primary) hypertension: Secondary | ICD-10-CM

## 2020-10-22 DIAGNOSIS — I25118 Atherosclerotic heart disease of native coronary artery with other forms of angina pectoris: Secondary | ICD-10-CM

## 2020-10-22 MED ORDER — DAPAGLIFLOZIN PROPANEDIOL 10 MG PO TABS: 10.0000 mg | ORAL_TABLET | Freq: Every day | ORAL | 3 refills | Status: DC

## 2020-10-22 MED ORDER — ENTRESTO 49-51 MG PO TABS: 1.0000 | ORAL_TABLET | Freq: Two times a day (BID) | ORAL | 3 refills | Status: DC

## 2020-10-22 NOTE — Telephone Encounter (Signed)
    [place] [address] Dept: [phone] Loc: [place]  10/22/2020  You are scheduled for a Cardiac Cath on Tuesday 3/29/22with Dr.Jordan.  1. Please arrive at the [facility] (Main Entrance A) at [facility]: [address] [place], [place] [address] at 10:00 am (This time is two hours before your procedure to ensure your preparation). Free valet parking service is available.   Special note: Every effort is made to have your procedure done on time. Please understand that emergencies sometimes delay scheduled procedures.  2. Diet: Do not eat solid foods after midnight.  The patient may have clear liquids until 5am upon the day of the procedure.  3. Labs: You will need to have blood drawn on  Friday 10/23/20.You do not need to be fasting.     Covid Test Friday 10/23/20 at 11:45 am  [place] [employer] until after cath.  4. Medication instructions in preparation for your procedure:     Hold Metformin day of cath and Hold 2 days after cath.       On the morning of your procedure, take your Aspirin 81 mg.  and any morning medicines NOT listed above.  You may use sips of water.  5. Plan for one night stay--bring personal belongings. 6. Bring a current list of your medications and current insurance cards. 7. You MUST have a responsible person to drive you home. 8. Someone MUST be with you the first 24 hours after you arrive home or your discharge will be delayed. 9. Please wear clothes that are easy to get on and off and wear slip-on shoes.  Thank you for allowing [place] to care for you!   -- [facility]

## 2020-10-23 ENCOUNTER — Other Ambulatory Visit: Payer: Self-pay | Admitting: Cardiology

## 2020-10-23 ENCOUNTER — Other Ambulatory Visit: Payer: Self-pay

## 2020-10-23 ENCOUNTER — Other Ambulatory Visit (HOSPITAL_COMMUNITY)
Admission: RE | Admit: 2020-10-23 | Discharge: 2020-10-23 | Disposition: A | Discharge status: Home / Self Care | Payer: Medicare Other | Source: Ambulatory Visit | Attending: Cardiology | Admitting: Cardiology

## 2020-10-23 ENCOUNTER — Telehealth: Payer: Self-pay

## 2020-10-23 DIAGNOSIS — I2510 Atherosclerotic heart disease of native coronary artery without angina pectoris: Secondary | ICD-10-CM

## 2020-10-23 DIAGNOSIS — Z20822 Contact with and (suspected) exposure to covid-19: Secondary | ICD-10-CM | POA: Insufficient documentation

## 2020-10-23 DIAGNOSIS — R0609 Other forms of dyspnea: Secondary | ICD-10-CM

## 2020-10-23 DIAGNOSIS — R0600 Dyspnea, unspecified: Secondary | ICD-10-CM

## 2020-10-23 DIAGNOSIS — Z01812 Encounter for preprocedural laboratory examination: Secondary | ICD-10-CM | POA: Diagnosis present

## 2020-10-23 LAB — CBC WITH DIFFERENTIAL/PLATELET
Basophils Absolute: 0.1 10*3/uL (ref 0.0–0.2)
Basos: 1 %
EOS (ABSOLUTE): 0.2 10*3/uL (ref 0.0–0.4)
Eos: 2 %
Hematocrit: 47.1 % (ref 37.5–51.0)
Hemoglobin: 15.4 g/dL (ref 13.0–17.7)
Immature Grans (Abs): 0 10*3/uL (ref 0.0–0.1)
Immature Granulocytes: 0 %
Lymphocytes Absolute: 2.6 10*3/uL (ref 0.7–3.1)
Lymphs: 31 %
MCH: 30.5 pg (ref 26.6–33.0)
MCHC: 32.7 g/dL (ref 31.5–35.7)
MCV: 93 fL (ref 79–97)
Monocytes Absolute: 0.6 10*3/uL (ref 0.1–0.9)
Monocytes: 8 %
Neutrophils Absolute: 4.8 10*3/uL (ref 1.4–7.0)
Neutrophils: 58 %
Platelets: 268 10*3/uL (ref 150–450)
RBC: 5.05 x10E6/uL (ref 4.14–5.80)
RDW: 12.8 % (ref 11.6–15.4)
WBC: 8.3 10*3/uL (ref 3.4–10.8)

## 2020-10-23 LAB — BASIC METABOLIC PANEL
BUN/Creatinine Ratio: 18 (ref 10–24)
BUN: 17 mg/dL (ref 8–27)
CO2: 24 mmol/L (ref 20–29)
Calcium: 10.1 mg/dL (ref 8.6–10.2)
Chloride: 104 mmol/L (ref 96–106)
Creatinine, Ser: 0.93 mg/dL (ref 0.76–1.27)
Glucose: 119 mg/dL — ABNORMAL HIGH (ref 65–99)
Potassium: 4.5 mmol/L (ref 3.5–5.2)
Sodium: 142 mmol/L (ref 134–144)
eGFR: 79 mL/min/{1.73_m2} (ref >59)

## 2020-10-23 LAB — SARS CORONAVIRUS 2 (TAT 6-24 HRS): SARS Coronavirus 2: NEGATIVE

## 2020-10-23 LAB — PT AND PTT
INR: 1 (ref 0.9–1.2)
Prothrombin Time: 10.7 s (ref 9.1–12.0)
aPTT: 27 s (ref 24–33)

## 2020-10-23 LAB — SPECIMEN STATUS REPORT

## 2020-10-23 MED ORDER — ENTRESTO 49-51 MG PO TABS: 1.0000 | ORAL_TABLET | Freq: Two times a day (BID) | ORAL | 3 refills | Status: DC

## 2020-10-23 MED ORDER — DAPAGLIFLOZIN PROPANEDIOL 10 MG PO TABS: 10.0000 mg | ORAL_TABLET | Freq: Every day | ORAL | 3 refills | Status: DC

## 2020-10-23 MED ORDER — SODIUM CHLORIDE 0.9% FLUSH: 3.0000 mL | Freq: Two times a day (BID) | INTRAVENOUS | Status: DC

## 2020-10-23 NOTE — Telephone Encounter (Signed)
Patient and wife came to office this morning.Cath instructions reviewed and given to patient.He will stop Olmesartan and start Entresto 49/51 mg twice a day and Farxiga 10 mg daily on 10/24/20.Scheduler will call back with echo appointment.

## 2020-10-26 ENCOUNTER — Telehealth: Payer: Self-pay | Admitting: *Deleted

## 2020-10-26 NOTE — Telephone Encounter (Signed)
Pt contacted pre-catheterization scheduled at [facility] for: Tuesday October 27, 2020 12 Noon Verified arrival time and place: [place] [facility]) at: 10 AM   No solid food after midnight prior to cath, clear liquids until 5 AM day of procedure.  Hold: Metformin-day of procedure and 48 hours post procedure Farixiga-AM of procedure   Except hold medications AM meds can be  taken pre-cath with sips of water including: ASA 81 mg   Confirmed patient has responsible adult to drive home post procedure and be with patient first 24 hours after arriving home: yes  You are allowed ONE visitor in the waiting room during the time you are at the hospital for your procedure. Both you and your visitor must wear a mask once you enter the hospital.   Reviewed procedure/mask/visitor instructions with patient's wife (pt verbal permission).

## 2020-10-27 ENCOUNTER — Other Ambulatory Visit: Payer: Self-pay

## 2020-10-27 ENCOUNTER — Ambulatory Visit (HOSPITAL_COMMUNITY)
Admission: RE | Admit: 2020-10-27 | Discharge: 2020-10-27 | Disposition: A | Discharge status: Home / Self Care | Payer: Medicare Other | Attending: Cardiology | Admitting: Cardiology

## 2020-10-27 ENCOUNTER — Encounter (HOSPITAL_COMMUNITY)
Admission: RE | Disposition: A | Discharge status: Home / Self Care | Payer: Self-pay | Source: Home / Self Care | Attending: Cardiology

## 2020-10-27 DIAGNOSIS — Z7982 Long term (current) use of aspirin: Secondary | ICD-10-CM | POA: Diagnosis not present

## 2020-10-27 DIAGNOSIS — E785 Hyperlipidemia, unspecified: Secondary | ICD-10-CM | POA: Diagnosis not present

## 2020-10-27 DIAGNOSIS — R0600 Dyspnea, unspecified: Secondary | ICD-10-CM | POA: Diagnosis not present

## 2020-10-27 DIAGNOSIS — Z888 Allergy status to other drugs, medicaments and biological substances status: Secondary | ICD-10-CM | POA: Insufficient documentation

## 2020-10-27 DIAGNOSIS — Z882 Allergy status to sulfonamides status: Secondary | ICD-10-CM | POA: Diagnosis not present

## 2020-10-27 DIAGNOSIS — Z91013 Allergy to seafood: Secondary | ICD-10-CM | POA: Diagnosis not present

## 2020-10-27 DIAGNOSIS — I10 Essential (primary) hypertension: Secondary | ICD-10-CM | POA: Diagnosis present

## 2020-10-27 DIAGNOSIS — Z7984 Long term (current) use of oral hypoglycemic drugs: Secondary | ICD-10-CM | POA: Diagnosis not present

## 2020-10-27 DIAGNOSIS — R001 Bradycardia, unspecified: Secondary | ICD-10-CM | POA: Insufficient documentation

## 2020-10-27 DIAGNOSIS — R0609 Other forms of dyspnea: Secondary | ICD-10-CM | POA: Diagnosis not present

## 2020-10-27 DIAGNOSIS — E119 Type 2 diabetes mellitus without complications: Secondary | ICD-10-CM

## 2020-10-27 DIAGNOSIS — Z8616 Personal history of COVID-19: Secondary | ICD-10-CM | POA: Insufficient documentation

## 2020-10-27 DIAGNOSIS — Z87891 Personal history of nicotine dependence: Secondary | ICD-10-CM | POA: Diagnosis not present

## 2020-10-27 DIAGNOSIS — I252 Old myocardial infarction: Secondary | ICD-10-CM

## 2020-10-27 DIAGNOSIS — Z79899 Other long term (current) drug therapy: Secondary | ICD-10-CM | POA: Diagnosis not present

## 2020-10-27 DIAGNOSIS — Z955 Presence of coronary angioplasty implant and graft: Secondary | ICD-10-CM | POA: Diagnosis not present

## 2020-10-27 DIAGNOSIS — I2510 Atherosclerotic heart disease of native coronary artery without angina pectoris: Secondary | ICD-10-CM | POA: Diagnosis present

## 2020-10-27 DIAGNOSIS — R9439 Abnormal result of other cardiovascular function study: Secondary | ICD-10-CM | POA: Diagnosis present

## 2020-10-27 HISTORY — PX: RIGHT/LEFT HEART CATH AND CORONARY ANGIOGRAPHY: CATH118266

## 2020-10-27 LAB — POCT I-STAT EG7
Acid-base deficit: 3 mmol/L — ABNORMAL HIGH (ref 0.0–2.0)
Bicarbonate: 22.1 mmol/L (ref 20.0–28.0)
Calcium, Ion: 1.28 mmol/L (ref 1.15–1.40)
HCT: 46 % (ref 39.0–52.0)
Hemoglobin: 15.6 g/dL (ref 13.0–17.0)
O2 Saturation: 75 %
Potassium: 3.9 mmol/L (ref 3.5–5.1)
Sodium: 142 mmol/L (ref 135–145)
TCO2: 23 mmol/L (ref 22–32)
pCO2, Ven: 40.5 mmHg — ABNORMAL LOW (ref 44.0–60.0)
pH, Ven: 7.346 (ref 7.250–7.430)
pO2, Ven: 42 mmHg (ref 32.0–45.0)

## 2020-10-27 LAB — POCT I-STAT 7, (LYTES, BLD GAS, ICA,H+H)
Acid-base deficit: 4 mmol/L — ABNORMAL HIGH (ref 0.0–2.0)
Bicarbonate: 21 mmol/L (ref 20.0–28.0)
Calcium, Ion: 1.29 mmol/L (ref 1.15–1.40)
HCT: 48 % (ref 39.0–52.0)
Hemoglobin: 16.3 g/dL (ref 13.0–17.0)
O2 Saturation: 98 %
Potassium: 3.9 mmol/L (ref 3.5–5.1)
Sodium: 143 mmol/L (ref 135–145)
TCO2: 22 mmol/L (ref 22–32)
pCO2 arterial: 37.6 mmHg (ref 32.0–48.0)
pH, Arterial: 7.355 (ref 7.350–7.450)
pO2, Arterial: 109 mmHg — ABNORMAL HIGH (ref 83.0–108.0)

## 2020-10-27 LAB — GLUCOSE, CAPILLARY: Glucose-Capillary: 160 mg/dL — ABNORMAL HIGH (ref 70–99)

## 2020-10-27 SURGERY — RIGHT/LEFT HEART CATH AND CORONARY ANGIOGRAPHY
Anesthesia: LOCAL

## 2020-10-27 MED ORDER — FENTANYL CITRATE (PF) 100 MCG/2ML IJ SOLN
INTRAMUSCULAR | Status: DC | PRN
  Administered 2020-10-27: 25 ug via INTRAVENOUS

## 2020-10-27 MED ORDER — SODIUM CHLORIDE 0.9 % IV SOLN: INTRAVENOUS | Status: DC

## 2020-10-27 MED ORDER — ONDANSETRON HCL 4 MG/2ML IJ SOLN: 4.0000 mg | Freq: Four times a day (QID) | INTRAMUSCULAR | Status: DC | PRN

## 2020-10-27 MED ORDER — HEPARIN (PORCINE) IN NACL 1000-0.9 UT/500ML-% IV SOLN
INTRAVENOUS | Status: DC | PRN
  Administered 2020-10-27 (×2): 500 mL

## 2020-10-27 MED ORDER — ACETAMINOPHEN 325 MG PO TABS: 650.0000 mg | ORAL_TABLET | ORAL | Status: DC | PRN

## 2020-10-27 MED ORDER — VERAPAMIL HCL 2.5 MG/ML IV SOLN
INTRAVENOUS | Status: DC | PRN
  Administered 2020-10-27: 10 mL via INTRA_ARTERIAL

## 2020-10-27 MED ORDER — HEPARIN SODIUM (PORCINE) 1000 UNIT/ML IJ SOLN
INTRAMUSCULAR | Status: DC | PRN
  Administered 2020-10-27: 3000 [IU] via INTRAVENOUS

## 2020-10-27 MED ORDER — SODIUM CHLORIDE 0.9% FLUSH
3.0000 mL | INTRAVENOUS | Status: DC | PRN
Start: 2020-10-28 — End: 2020-10-27

## 2020-10-27 MED ORDER — CLOPIDOGREL BISULFATE 75 MG PO TABS: 75.0000 mg | ORAL_TABLET | Freq: Every day | ORAL | 3 refills | Status: DC

## 2020-10-27 MED ORDER — MIDAZOLAM HCL 2 MG/2ML IJ SOLN
INTRAMUSCULAR | Status: DC | PRN
  Administered 2020-10-27: 1 mg via INTRAVENOUS

## 2020-10-27 MED ORDER — ASPIRIN 81 MG PO CHEW: 81.0000 mg | CHEWABLE_TABLET | ORAL | Status: DC

## 2020-10-27 MED ORDER — SODIUM CHLORIDE 0.9% FLUSH: 3.0000 mL | Freq: Two times a day (BID) | INTRAVENOUS | Status: DC

## 2020-10-27 MED ORDER — CLOPIDOGREL BISULFATE 300 MG PO TABS
600.0000 mg | ORAL_TABLET | Freq: Once | ORAL | Status: AC
  Administered 2020-10-27: 600 mg via ORAL
  Filled 2020-10-27: qty 2

## 2020-10-27 MED ORDER — MIDAZOLAM HCL 2 MG/2ML IJ SOLN
INTRAMUSCULAR | Status: AC
  Filled 2020-10-27: qty 2

## 2020-10-27 MED ORDER — HEPARIN SODIUM (PORCINE) 1000 UNIT/ML IJ SOLN
INTRAMUSCULAR | Status: AC
  Filled 2020-10-27: qty 1

## 2020-10-27 MED ORDER — FENTANYL CITRATE (PF) 100 MCG/2ML IJ SOLN
INTRAMUSCULAR | Status: AC
  Filled 2020-10-27: qty 2

## 2020-10-27 MED ORDER — SODIUM CHLORIDE 0.9% FLUSH: 3.0000 mL | INTRAVENOUS | Status: DC | PRN

## 2020-10-27 MED ORDER — SODIUM CHLORIDE 0.9 % WEIGHT BASED INFUSION: 1.0000 mL/kg/h | INTRAVENOUS | Status: DC

## 2020-10-27 MED ORDER — SODIUM CHLORIDE 0.9 % IV SOLN: 250.0000 mL | INTRAVENOUS | Status: DC | PRN

## 2020-10-27 MED ORDER — LIDOCAINE HCL (PF) 1 % IJ SOLN
INTRAMUSCULAR | Status: DC | PRN
  Administered 2020-10-27: 15 mL
  Administered 2020-10-27 (×2): 2 mL

## 2020-10-27 MED ORDER — IOHEXOL 350 MG/ML SOLN
INTRAVENOUS | Status: DC | PRN
  Administered 2020-10-27: 65 mL

## 2020-10-27 MED ORDER — LIDOCAINE HCL (PF) 1 % IJ SOLN
INTRAMUSCULAR | Status: AC
  Filled 2020-10-27: qty 30

## 2020-10-27 MED ORDER — VERAPAMIL HCL 2.5 MG/ML IV SOLN
INTRAVENOUS | Status: AC
  Filled 2020-10-27: qty 2

## 2020-10-27 SURGICAL SUPPLY — 13 items
CATH 5FR JL3.5 JR4 ANG PIG MP (CATHETERS) ×2 IMPLANT
CATH SWAN GANZ 7F STRAIGHT (CATHETERS) ×2 IMPLANT
DEVICE RAD COMP TR BAND LRG (VASCULAR PRODUCTS) ×2 IMPLANT
GLIDESHEATH SLEND SS 6F .021 (SHEATH) ×2 IMPLANT
GLIDESHEATH SLENDER 7FR .021G (SHEATH) ×2 IMPLANT
GUIDEWIRE .025 260CM (WIRE) ×2 IMPLANT
GUIDEWIRE INQWIRE 1.5J.035X260 (WIRE) ×1 IMPLANT
INQWIRE 1.5J .035X260CM (WIRE) ×2
KIT HEART LEFT (KITS) ×2 IMPLANT
PACK CARDIAC CATHETERIZATION (CUSTOM PROCEDURE TRAY) ×2 IMPLANT
SHEATH PINNACLE 7F 10CM (SHEATH) ×2 IMPLANT
TRANSDUCER W/STOPCOCK (MISCELLANEOUS) ×2 IMPLANT
TUBING CIL FLEX 10 FLL-RA (TUBING) ×2 IMPLANT

## 2020-10-27 NOTE — Interval H&P Note (Signed)
History and Physical Interval Note:  10/27/2020 12:53 PM  John Gamble  has presented today for surgery, with the diagnosis of sob.  The various methods of treatment have been discussed with the patient and family. After consideration of risks, benefits and other options for treatment, the patient has consented to  Procedure(s): RIGHT/LEFT HEART CATH AND CORONARY ANGIOGRAPHY (N/A) as a surgical intervention.  The patient's history has been reviewed, patient examined, no change in status, stable for surgery.  I have reviewed the patient's chart and labs.  Questions were answered to the patient's satisfaction.    Cath Lab Visit (complete for each Cath Lab visit)  Clinical Evaluation Leading to the Procedure:   ACS: No.  Non-ACS:    Anginal Classification: CCS III  Anti-ischemic medical therapy: Minimal Therapy (1 class of medications)  Non-Invasive Test Results: High-risk stress test findings: cardiac mortality >3%/year  Prior CABG: No previous CABG       John Gamble [facility] 10/27/2020 12:53 PM

## 2020-10-27 NOTE — Discharge Instructions (Signed)
Get Covid test on Thursday 10/29/20 before 11AM.  Arrive at [facility] on Friday 10/30/20 @ 9:30.  Nothing to eat or drink after midnight.  Follow same instructions as cath instructions.  You may or may not spend the night

## 2020-10-27 NOTE — Progress Notes (Addendum)
TR BAND REMOVAL  LOCATION:    right radial  DEFLATED PER PROTOCOL:    Yes.    TIME BAND OFF / DRESSING APPLIED:    1630   SITE UPON ARRIVAL:    Level 0  SITE AFTER BAND REMOVAL:    Level 0  CIRCULATION SENSATION AND MOVEMENT:    Within Normal Limits   Yes.    COMMENTS:

## 2020-10-28 ENCOUNTER — Encounter (HOSPITAL_COMMUNITY): Payer: Self-pay | Admitting: Cardiology

## 2020-10-29 ENCOUNTER — Emergency Department (HOSPITAL_COMMUNITY)
Admission: EM | Admit: 2020-10-29 | Discharge: 2020-10-29 | Disposition: A | Discharge status: Home / Self Care | Payer: Medicare Other | Attending: Emergency Medicine | Admitting: Emergency Medicine

## 2020-10-29 ENCOUNTER — Other Ambulatory Visit: Payer: Self-pay

## 2020-10-29 ENCOUNTER — Other Ambulatory Visit (HOSPITAL_COMMUNITY): Payer: Medicare Other

## 2020-10-29 DIAGNOSIS — Z96652 Presence of left artificial knee joint: Secondary | ICD-10-CM | POA: Insufficient documentation

## 2020-10-29 DIAGNOSIS — L539 Erythematous condition, unspecified: Secondary | ICD-10-CM | POA: Diagnosis not present

## 2020-10-29 DIAGNOSIS — Z7982 Long term (current) use of aspirin: Secondary | ICD-10-CM | POA: Insufficient documentation

## 2020-10-29 DIAGNOSIS — E119 Type 2 diabetes mellitus without complications: Secondary | ICD-10-CM | POA: Diagnosis not present

## 2020-10-29 DIAGNOSIS — R310 Gross hematuria: Secondary | ICD-10-CM | POA: Insufficient documentation

## 2020-10-29 DIAGNOSIS — Z79899 Other long term (current) drug therapy: Secondary | ICD-10-CM | POA: Diagnosis not present

## 2020-10-29 DIAGNOSIS — R109 Unspecified abdominal pain: Secondary | ICD-10-CM | POA: Insufficient documentation

## 2020-10-29 DIAGNOSIS — I2510 Atherosclerotic heart disease of native coronary artery without angina pectoris: Secondary | ICD-10-CM | POA: Diagnosis not present

## 2020-10-29 DIAGNOSIS — I10 Essential (primary) hypertension: Secondary | ICD-10-CM | POA: Insufficient documentation

## 2020-10-29 DIAGNOSIS — Z7902 Long term (current) use of antithrombotics/antiplatelets: Secondary | ICD-10-CM | POA: Diagnosis not present

## 2020-10-29 DIAGNOSIS — Z87891 Personal history of nicotine dependence: Secondary | ICD-10-CM | POA: Diagnosis not present

## 2020-10-29 DIAGNOSIS — R319 Hematuria, unspecified: Secondary | ICD-10-CM | POA: Diagnosis present

## 2020-10-29 LAB — COMPREHENSIVE METABOLIC PANEL
ALT: 16 U/L (ref 0–44)
AST: 19 U/L (ref 15–41)
Albumin: 3.6 g/dL (ref 3.5–5.0)
Alkaline Phosphatase: 70 U/L (ref 38–126)
Anion gap: 6 (ref 5–15)
BUN: 22 mg/dL (ref 8–23)
CO2: 22 mmol/L (ref 22–32)
Calcium: 9 mg/dL (ref 8.9–10.3)
Chloride: 111 mmol/L (ref 98–111)
Creatinine, Ser: 1.01 mg/dL (ref 0.61–1.24)
GFR, Estimated: >60 mL/min (ref >60)
Glucose, Bld: 169 mg/dL — ABNORMAL HIGH (ref 70–99)
Potassium: 4.1 mmol/L (ref 3.5–5.1)
Sodium: 139 mmol/L (ref 135–145)
Total Bilirubin: 0.9 mg/dL (ref 0.3–1.2)
Total Protein: 5.8 g/dL — ABNORMAL LOW (ref 6.5–8.1)

## 2020-10-29 LAB — URINALYSIS, ROUTINE W REFLEX MICROSCOPIC

## 2020-10-29 LAB — CBC
HCT: 47.5 % (ref 39.0–52.0)
Hemoglobin: 15.7 g/dL (ref 13.0–17.0)
MCH: 31 pg (ref 26.0–34.0)
MCHC: 33.1 g/dL (ref 30.0–36.0)
MCV: 93.9 fL (ref 80.0–100.0)
Platelets: 257 10*3/uL (ref 150–400)
RBC: 5.06 MIL/uL (ref 4.22–5.81)
RDW: 13.6 % (ref 11.5–15.5)
WBC: 10.8 10*3/uL — ABNORMAL HIGH (ref 4.0–10.5)
nRBC: 0 % (ref 0.0–0.2)

## 2020-10-29 LAB — URINALYSIS, MICROSCOPIC (REFLEX)
RBC / HPF: >50 RBC/hpf (ref 0–5)
WBC, UA: >50 WBC/hpf (ref 0–5)

## 2020-10-29 MED ORDER — CEPHALEXIN 500 MG PO CAPS: 500.0000 mg | ORAL_CAPSULE | Freq: Four times a day (QID) | ORAL | 0 refills | Status: DC

## 2020-10-29 MED ORDER — NYSTATIN 100000 UNIT/GM EX POWD
Freq: Once | CUTANEOUS | Status: DC
  Filled 2020-10-29: qty 15

## 2020-10-29 NOTE — ED Provider Notes (Signed)
[facility] EMERGENCY DEPARTMENT Provider Note   CSN: [id number] Arrival date & time: 10/29/20  5956     History Chief Complaint  Patient presents with  . Dysuria    John Gamble is a 87 y.o. male.  87 year old male presented to ED with shortness of breath on 3/29 and was taken to Cath Lab.  Patient did not receive a stent at that time but was instead told to return on 4/1 for stent placement.  Since going home after the initial procedure patient endorses frank red bleed from penis particularly with urination which is associated with weak stream, small volumes and inability to fully empty his bladder.  He has had urinary leakage and accidents.  He has no history of this prior to the procedure.  He does not think that he got a urinary catheter during the procedure on Tuesday.  He endorses having a history of nocturia and his frequency of urination has not changed since onset of symptoms.  Denies dysuria, fever. he otherwise feels well.    Past Medical History:  Diagnosis Date  . Coronary artery disease   . Hyperlipidemia   . Hypertension   . MI, old   . OA (osteoarthritis)   . Pneumonia    hx of 2013     Patient Active Problem List   Diagnosis Date Noted  . Dyspnea on exertion   . Type 2 diabetes mellitus without complication, without long-term current use of insulin ([place]) 08/21/2017  . Abnormal nuclear stress test 05/06/2015  . Primary osteoarthritis of knee 09/19/2014  . S/P knee replacement 09/19/2014  . S/P total knee arthroplasty 09/19/2014  . Lower gastrointestinal bleed 01/30/2012  . Coronary artery disease   . MI, old   . Hypertension   . Hyperlipidemia     Past Surgical History:  Procedure Laterality Date  . CARDIAC CATHETERIZATION  01/26/1999   EF 55%  . CARDIAC CATHETERIZATION N/A 05/06/2015   Procedure: Left Heart Cath and Coronary Angiography;  Surgeon: Peter M [name], MD;  Location: [place] CV LAB;  Service: Cardiovascular;   Laterality: N/A;  . CARDIOVASCULAR STRESS TEST  02/22/2010   EF 61%  . corneal implant     left  . CORONARY STENT PLACEMENT  2000   LEFT CIRCUMFLEX CORONARY  . INGUINAL HERNIA REPAIR    . REPLACEMENT TOTAL KNEE    . RIGHT/LEFT HEART CATH AND CORONARY ANGIOGRAPHY N/A 10/27/2020   Procedure: RIGHT/LEFT HEART CATH AND CORONARY ANGIOGRAPHY;  Surgeon: [name], Peter M, MD;  Location: [place] [facility] CV LAB;  Service: Cardiovascular;  Laterality: N/A;  . TOTAL KNEE ARTHROPLASTY Left 09/19/2014   Procedure: LEFT TOTAL KNEE ARTHROPLASTY;  Surgeon: Sydnee Cabal, MD;  Location: [facility];  Service: Orthopedics;  Laterality: Left;      Family History  Problem Relation Age of Onset  . Cancer Mother   . Cancer Father     Social History   Tobacco Use  . Smoking status: Former Smoker    Packs/day: 0.50    Years: 50.00    Pack years: 25.00    Quit date: 02/23/1989    Years since quitting: 31.7  . Smokeless tobacco: Never Used  Vaping Use  . Vaping Use: Never used  Substance Use Topics  . Alcohol use: No  . Drug use: No    Home Medications Prior to Admission medications   Medication Sig Start Date End Date Taking? Authorizing Provider  acyclovir (ZOVIRAX) 800 MG tablet Take 800 mg by  mouth daily.  03/03/14   [provider]  amLODipine (NORVASC) 5 MG tablet TAKE ONE (1) TABLET ONCE DAILY Patient taking differently: Take 5 mg by mouth daily. 06/04/20   [name], Peter M, MD  aspirin EC 81 MG tablet Take 81 mg by mouth daily before supper.    [provider]  clopidogrel (PLAVIX) 75 MG tablet Take 1 tablet (75 mg total) by mouth daily. 10/27/20 10/27/21  [name], Peter M, MD  dapagliflozin propanediol (FARXIGA) 10 MG TABS tablet Take 1 tablet (10 mg total) by mouth daily before breakfast. 10/23/20   [name], Peter M, MD  dorzolamide (TRUSOPT) 2 % ophthalmic solution Place 1 drop into the right eye at bedtime.    [provider]  Glucosamine-Chondroitin 250-200 MG TABS Take 2  tablets by mouth 2 (two) times daily.    [provider]  glucose blood (ONETOUCH ULTRA) test strip USE 1 STRIP DAILY. 08/20/20   [provider]  Lancets (ONETOUCH DELICA PLUS [id number]) [place] SMARTSIG:1 Unit(s) Topical Daily 07/01/20   [provider]  Multiple Vitamin (MULTIVITAMIN WITH MINERALS) TABS tablet Take 1 tablet by mouth daily. Centrum Silver    [provider]  Omega-3 Fatty Acids (FISH OIL) 1200 MG CAPS Take 1,200 mg by mouth daily.    [provider]  [facility] ULTRA test strip daily. 08/20/20   [provider]  polyethylene glycol (MIRALAX / GLYCOLAX) packet Take 17 g by mouth daily. Mix with 8 oz liquid and drink    [provider]  prednisoLONE acetate (PRED FORTE) 1 % ophthalmic suspension Place 1 drop into the left eye daily. 09/12/20   [provider]  rosuvastatin (CRESTOR) 20 MG tablet Take 20 mg by mouth daily.    [provider]  sacubitril-valsartan (ENTRESTO) 49-51 MG Take 1 tablet by mouth 2 (two) times daily. 10/23/20   [name], Peter M, MD  Zinc 50 MG CAPS Take 50 mg by mouth daily with supper.     [provider]    Allergies    Atorvastatin, Oysters [shellfish allergy], and Sulfa antibiotics  Review of Systems   Review of Systems  Constitutional: Negative for activity change, appetite change and fever.  Respiratory: Negative for chest tightness.   Cardiovascular: Negative for chest pain.  Gastrointestinal: Positive for abdominal pain.  Genitourinary: Positive for decreased urine volume, difficulty urinating, frequency, genital sores, hematuria and urgency. Negative for dysuria, penile discharge and penile pain.  Skin: Negative for rash.    Physical Exam Updated Vital Signs BP 128/62 (BP Location: Right Arm)   Pulse (!) 30   Temp 97.9 F (36.6 C) (Oral)   Resp (!) 23   SpO2 97%   Physical Exam Vitals and nursing note reviewed. Exam conducted with a chaperone present.   Constitutional:      General: He is not in acute distress.    Appearance: Normal appearance. He is not ill-appearing or toxic-appearing.  Cardiovascular:     Rate and Rhythm: Normal rate and regular rhythm.     Pulses: Normal pulses.  Pulmonary:     Effort: Pulmonary effort is normal.  Abdominal:     General: Abdomen is flat.     Palpations: Abdomen is soft.     Tenderness: There is abdominal tenderness (mild tenderness at suprapubic area).  Genitourinary:    Penis: Uncircumcised. Erythema present. No phimosis or tenderness.      Testes: Normal.        Right: Tenderness or swelling not present.  Left: Tenderness or swelling not present.     Comments: Positive for ecchymosis/petechia in right groin area from recent percutaneous intervention. No frank blood or discharge from urethra at this time.  There is fresh blood in his depends.  No blood from rectum either. Neurological:     Mental Status: He is alert.    ED Results / Procedures / Treatments   Labs (all labs ordered are listed, but only abnormal results are displayed) Labs Reviewed  CBC - Abnormal; Notable for the following components:      Result Value   WBC 10.8 (*)    All other components within normal limits  URINALYSIS, ROUTINE W REFLEX MICROSCOPIC - Abnormal; Notable for the following components:   Color, Urine RED (*)    APPearance TURBID (*)    Glucose, UA   (*)    Value: TEST NOT REPORTED DUE TO COLOR INTERFERENCE OF URINE PIGMENT   Hgb urine dipstick   (*)    Value: TEST NOT REPORTED DUE TO COLOR INTERFERENCE OF URINE PIGMENT   Bilirubin Urine   (*)    Value: TEST NOT REPORTED DUE TO COLOR INTERFERENCE OF URINE PIGMENT   Ketones, ur   (*)    Value: TEST NOT REPORTED DUE TO COLOR INTERFERENCE OF URINE PIGMENT   Protein, ur   (*)    Value: TEST NOT REPORTED DUE TO COLOR INTERFERENCE OF URINE PIGMENT   Nitrite   (*)    Value: TEST NOT REPORTED DUE TO COLOR INTERFERENCE OF URINE PIGMENT   Leukocytes,Ua    (*)    Value: TEST NOT REPORTED DUE TO COLOR INTERFERENCE OF URINE PIGMENT   All other components within normal limits  URINALYSIS, MICROSCOPIC (REFLEX) - Abnormal; Notable for the following components:   Bacteria, UA MANY (*)    Non Squamous Epithelial PRESENT (*)    All other components within normal limits  URINE CULTURE  COMPREHENSIVE METABOLIC PANEL    EKG EKG Interpretation  Date/Time:  Thursday October 29 2020 09:29:09 EDT Ventricular Rate:  97 PR Interval:  182 QRS Duration: 103 QT Interval:  368 QTC Calculation: 468 R Axis:   -52 Text Interpretation: Sinus rhythm Ventricular premature complex Incomplete RBBB and LAFB Abnormal R-wave progression, late transition LVH with secondary repolarization abnormality No significant change since last tracing Confirmed by Deno Etienne [phone]) on 10/29/2020 11:07:22 AM   Radiology CARDIAC CATHETERIZATION  Result Date: 10/28/2020  Ost LAD to Prox LAD lesion is 95% stenosed.  1st Diag lesion is 70% stenosed.  Previously placed Ost Cx to Prox Cx stent (unknown type) is widely patent.  Previously placed LPDA stent (unknown type) is widely patent.  Ost RCA to Prox RCA lesion is 90% stenosed.  There is severe left ventricular systolic dysfunction.  LV end diastolic pressure is normal.  The left ventricular ejection fraction is 25-35% by visual estimate.  1. Severe 2 vessel obstructive CAD.    - 90% ostial LAD. Severely calcified. 70% first diagonal    - patent stents in the proximal LCx and left PDA    - 90% ostial nondominant RCA 2. Severe LV dysfunction with regional wall motion abnormalities. 3. Normal LV filling pressures 4. Normal right heart pressures. 5. Preserved cardiac output. Plan: recommend staged complex  PCI of the ostial LAD with atherectomy and stenting. The diagonal and RCA disease are old and involve small vessels. While CABG would be an option given his advanced age I would favor a percutaneous approach.  Procedures Procedures   Medications Ordered in ED Medications  nystatin (MYCOSTATIN/NYSTOP) topical powder (has no administration in time range)    ED Course  I have reviewed the triage vital signs and the nursing notes.  Pertinent labs & imaging results that were available during my care of the patient were reviewed by me and considered in my medical decision making (see chart for details).    MDM Rules/Calculators/A&P                         Patient has gross hematuria s/p cath likely related to anticoagulation but could be revealing an underlying pathology such as bladder cancer.  Patient had post-void residual of 0cc so no foley was placed today.  Prescribed keflex x10 days and urgent urology follow up. Patient was discharged home in stable condition.  Follow up for cardiology procedure tomorrow.   Final Clinical Impression(s) / ED Diagnoses Final diagnoses:  None    Rx / DC Orders ED Discharge Orders    None       Richarda Osmond, DO 10/29/20 [place], DO 10/29/20 1448

## 2020-10-29 NOTE — ED Triage Notes (Signed)
Pt reports difficulty urinating and dark brown urine since pt reports for the last 4-48mo. Pt also states had a heart procedure done Tues by Dr. [name] was told to return today for more tests but wanted his UTI checked first. Denies CP/palpitations. Reports has stent placement scheduled for tomorrow due to ongoing SOB.

## 2020-10-29 NOTE — Discharge Instructions (Addendum)
Your urine has blood which can be a side effect of medications given at your procedure, and possible infection. We are going to start antibiotics and want you to follow up with urology as soon as you can. You can proceed with your heart procedure tomorrow as long as your cardiologist would like to proceed.

## 2020-10-29 NOTE — Progress Notes (Signed)
Review chart for PCI procedure 4/1, noticed that patient was seen in ER for hematuria and started on antibiotic.  Spoke with Dr [name] and reviewed chart, patient's procedure will be rescheduled for Wednesday 4/6.  Spoke with patient to let him know of rescheduling the procedure.

## 2020-10-31 LAB — URINE CULTURE

## 2020-11-03 ENCOUNTER — Other Ambulatory Visit (HOSPITAL_COMMUNITY)
Admission: RE | Admit: 2020-11-03 | Discharge: 2020-11-03 | Disposition: A | Discharge status: Home / Self Care | Payer: Medicare Other | Source: Ambulatory Visit | Attending: Cardiology | Admitting: Cardiology

## 2020-11-03 ENCOUNTER — Telehealth: Payer: Self-pay | Admitting: *Deleted

## 2020-11-03 DIAGNOSIS — Z01812 Encounter for preprocedural laboratory examination: Secondary | ICD-10-CM | POA: Insufficient documentation

## 2020-11-03 DIAGNOSIS — Z20822 Contact with and (suspected) exposure to covid-19: Secondary | ICD-10-CM | POA: Insufficient documentation

## 2020-11-03 LAB — SARS CORONAVIRUS 2 (TAT 6-24 HRS): SARS Coronavirus 2: NEGATIVE

## 2020-11-03 NOTE — Telephone Encounter (Signed)
Spoke with patient's wife (DPR).

## 2020-11-03 NOTE — Telephone Encounter (Signed)
Pt wife called back , returning Shellee Milo call   Best number -  [phone]

## 2020-11-03 NOTE — Telephone Encounter (Signed)
Reviewed procedure/mask/visitor instructions with patient's wife (DPR).

## 2020-11-03 NOTE — Telephone Encounter (Addendum)
Pt contacted pre-coronary stent intervention  scheduled at [facility] for: Wednesday October 04, 2020 10 AM Verified arrival time and place: [facility]) at: 8 AM   No solid food after midnight prior to cath, clear liquids until 5 AM day of procedure.  Hold: Farxiga-AM of procedure  Except hold medications AM meds can be  taken pre-cath with sips of water including: ASA 81 mg Plavix 75 mg  Confirmed patient has responsible adult to drive home post procedure and be with patient first 24 hours after arriving home: yes  You are allowed ONE visitor in the waiting room during the time you are at the hospital for your procedure. Both you and your visitor must wear a mask once you enter the hospital.  Reviewed procedure/mask/visitor instructions with patient's wife (DPR).

## 2020-11-04 ENCOUNTER — Ambulatory Visit (HOSPITAL_COMMUNITY)
Admission: RE | Disposition: A | Discharge status: Home / Self Care | Payer: Self-pay | Source: Home / Self Care | Attending: Cardiology

## 2020-11-04 ENCOUNTER — Observation Stay (HOSPITAL_COMMUNITY)
Admission: RE | Admit: 2020-11-04 | Discharge: 2020-11-06 | Disposition: A | Discharge status: Home / Self Care | Payer: Medicare Other | Attending: Cardiology | Admitting: Cardiology

## 2020-11-04 ENCOUNTER — Other Ambulatory Visit: Payer: Self-pay

## 2020-11-04 DIAGNOSIS — E785 Hyperlipidemia, unspecified: Secondary | ICD-10-CM | POA: Diagnosis present

## 2020-11-04 DIAGNOSIS — Z8616 Personal history of COVID-19: Secondary | ICD-10-CM | POA: Diagnosis not present

## 2020-11-04 DIAGNOSIS — Z87891 Personal history of nicotine dependence: Secondary | ICD-10-CM | POA: Insufficient documentation

## 2020-11-04 DIAGNOSIS — I10 Essential (primary) hypertension: Secondary | ICD-10-CM | POA: Diagnosis present

## 2020-11-04 DIAGNOSIS — Z96652 Presence of left artificial knee joint: Secondary | ICD-10-CM | POA: Diagnosis not present

## 2020-11-04 DIAGNOSIS — Z7982 Long term (current) use of aspirin: Secondary | ICD-10-CM | POA: Insufficient documentation

## 2020-11-04 DIAGNOSIS — R339 Retention of urine, unspecified: Secondary | ICD-10-CM

## 2020-11-04 DIAGNOSIS — I110 Hypertensive heart disease with heart failure: Secondary | ICD-10-CM | POA: Diagnosis not present

## 2020-11-04 DIAGNOSIS — Z955 Presence of coronary angioplasty implant and graft: Secondary | ICD-10-CM | POA: Insufficient documentation

## 2020-11-04 DIAGNOSIS — R319 Hematuria, unspecified: Secondary | ICD-10-CM

## 2020-11-04 DIAGNOSIS — Z79899 Other long term (current) drug therapy: Secondary | ICD-10-CM | POA: Insufficient documentation

## 2020-11-04 DIAGNOSIS — N390 Urinary tract infection, site not specified: Secondary | ICD-10-CM | POA: Insufficient documentation

## 2020-11-04 DIAGNOSIS — Z7984 Long term (current) use of oral hypoglycemic drugs: Secondary | ICD-10-CM | POA: Diagnosis not present

## 2020-11-04 DIAGNOSIS — E119 Type 2 diabetes mellitus without complications: Secondary | ICD-10-CM | POA: Diagnosis not present

## 2020-11-04 DIAGNOSIS — I2510 Atherosclerotic heart disease of native coronary artery without angina pectoris: Secondary | ICD-10-CM | POA: Diagnosis not present

## 2020-11-04 DIAGNOSIS — I25110 Atherosclerotic heart disease of native coronary artery with unstable angina pectoris: Secondary | ICD-10-CM | POA: Diagnosis not present

## 2020-11-04 DIAGNOSIS — I5043 Acute on chronic combined systolic (congestive) and diastolic (congestive) heart failure: Secondary | ICD-10-CM | POA: Insufficient documentation

## 2020-11-04 DIAGNOSIS — I200 Unstable angina: Secondary | ICD-10-CM | POA: Diagnosis present

## 2020-11-04 HISTORY — PX: CORONARY ATHERECTOMY: CATH118238

## 2020-11-04 HISTORY — PX: CORONARY STENT INTERVENTION: CATH118234

## 2020-11-04 HISTORY — PX: CORONARY ULTRASOUND/IVUS: CATH118244

## 2020-11-04 LAB — POCT ACTIVATED CLOTTING TIME: Activated Clotting Time: 487 seconds

## 2020-11-04 LAB — GLUCOSE, CAPILLARY: Glucose-Capillary: 153 mg/dL — ABNORMAL HIGH (ref 70–99)

## 2020-11-04 SURGERY — CORONARY STENT INTERVENTION
Anesthesia: LOCAL

## 2020-11-04 MED ORDER — POLYETHYLENE GLYCOL 3350 17 G PO PACK: 17.0000 g | PACK | Freq: Every day | ORAL | Status: DC | PRN

## 2020-11-04 MED ORDER — HEPARIN (PORCINE) IN NACL 1000-0.9 UT/500ML-% IV SOLN
INTRAVENOUS | Status: AC
  Filled 2020-11-04: qty 1000

## 2020-11-04 MED ORDER — PREDNISOLONE ACETATE 1 % OP SUSP
1.0000 [drp] | Freq: Every day | OPHTHALMIC | Status: DC
  Administered 2020-11-05 – 2020-11-06 (×2): 1 [drp] via OPHTHALMIC
  Filled 2020-11-04: qty 5

## 2020-11-04 MED ORDER — GLUCOSAMINE-CHONDROITIN 250-200 MG PO TABS: 2.0000 | ORAL_TABLET | Freq: Two times a day (BID) | ORAL | Status: DC

## 2020-11-04 MED ORDER — ADULT MULTIVITAMIN W/MINERALS CH
1.0000 | ORAL_TABLET | Freq: Every day | ORAL | Status: DC
  Administered 2020-11-05 – 2020-11-06 (×2): 1 via ORAL
  Filled 2020-11-04 (×2): qty 1

## 2020-11-04 MED ORDER — LIDOCAINE HCL (PF) 1 % IJ SOLN
INTRAMUSCULAR | Status: AC
  Filled 2020-11-04: qty 30

## 2020-11-04 MED ORDER — SODIUM CHLORIDE 0.9 % IV SOLN: INTRAVENOUS | Status: DC

## 2020-11-04 MED ORDER — FENTANYL CITRATE (PF) 100 MCG/2ML IJ SOLN
INTRAMUSCULAR | Status: DC | PRN
  Administered 2020-11-04: 25 ug via INTRAVENOUS

## 2020-11-04 MED ORDER — HEPARIN SODIUM (PORCINE) 1000 UNIT/ML IJ SOLN
INTRAMUSCULAR | Status: DC | PRN
  Administered 2020-11-04: 9000 [IU] via INTRAVENOUS

## 2020-11-04 MED ORDER — NITROGLYCERIN 1 MG/10 ML FOR IR/CATH LAB
INTRA_ARTERIAL | Status: DC | PRN
  Administered 2020-11-04 (×2): 200 ug via INTRACORONARY

## 2020-11-04 MED ORDER — ACYCLOVIR 400 MG PO TABS
800.0000 mg | ORAL_TABLET | Freq: Every day | ORAL | Status: DC
  Administered 2020-11-05 – 2020-11-06 (×2): 800 mg via ORAL
  Filled 2020-11-04: qty 1
  Filled 2020-11-04 (×2): qty 2

## 2020-11-04 MED ORDER — DAPAGLIFLOZIN PROPANEDIOL 10 MG PO TABS
10.0000 mg | ORAL_TABLET | Freq: Every day | ORAL | Status: DC
  Administered 2020-11-05: 10 mg via ORAL
  Filled 2020-11-04: qty 1

## 2020-11-04 MED ORDER — SACUBITRIL-VALSARTAN 49-51 MG PO TABS
1.0000 | ORAL_TABLET | Freq: Two times a day (BID) | ORAL | Status: DC
  Administered 2020-11-04 – 2020-11-06 (×3): 1 via ORAL
  Filled 2020-11-04 (×5): qty 1

## 2020-11-04 MED ORDER — CLOPIDOGREL BISULFATE 75 MG PO TABS: 75.0000 mg | ORAL_TABLET | ORAL | Status: DC

## 2020-11-04 MED ORDER — NITROGLYCERIN 1 MG/10 ML FOR IR/CATH LAB
INTRA_ARTERIAL | Status: AC
  Filled 2020-11-04: qty 10

## 2020-11-04 MED ORDER — ASPIRIN 81 MG PO CHEW: 81.0000 mg | CHEWABLE_TABLET | ORAL | Status: DC

## 2020-11-04 MED ORDER — MIDAZOLAM HCL 2 MG/2ML IJ SOLN
INTRAMUSCULAR | Status: DC | PRN
  Administered 2020-11-04: 1 mg via INTRAVENOUS

## 2020-11-04 MED ORDER — DORZOLAMIDE HCL 2 % OP SOLN
1.0000 [drp] | Freq: Every day | OPHTHALMIC | Status: DC
  Administered 2020-11-04: 1 [drp] via OPHTHALMIC
  Filled 2020-11-04: qty 10

## 2020-11-04 MED ORDER — VERAPAMIL HCL 2.5 MG/ML IV SOLN
INTRAVENOUS | Status: AC
  Filled 2020-11-04: qty 2

## 2020-11-04 MED ORDER — SODIUM CHLORIDE 0.9% FLUSH
3.0000 mL | Freq: Two times a day (BID) | INTRAVENOUS | Status: DC
  Administered 2020-11-04 – 2020-11-06 (×4): 3 mL via INTRAVENOUS

## 2020-11-04 MED ORDER — SODIUM CHLORIDE 0.9% FLUSH
3.0000 mL | INTRAVENOUS | Status: DC | PRN
  Administered 2020-11-04: 3 mL via INTRAVENOUS

## 2020-11-04 MED ORDER — OMEGA-3-ACID ETHYL ESTERS 1 G PO CAPS
1.0000 g | ORAL_CAPSULE | Freq: Every day | ORAL | Status: DC
  Administered 2020-11-05 – 2020-11-06 (×2): 1 g via ORAL
  Filled 2020-11-04 (×2): qty 1

## 2020-11-04 MED ORDER — IOHEXOL 350 MG/ML SOLN
INTRAVENOUS | Status: DC | PRN
  Administered 2020-11-04: 75 mL via INTRA_ARTERIAL

## 2020-11-04 MED ORDER — ASPIRIN EC 81 MG PO TBEC: 81.0000 mg | DELAYED_RELEASE_TABLET | Freq: Every day | ORAL | Status: DC

## 2020-11-04 MED ORDER — CEPHALEXIN 500 MG PO CAPS
500.0000 mg | ORAL_CAPSULE | Freq: Four times a day (QID) | ORAL | Status: DC
Start: 2020-11-04 — End: 2020-11-05
  Administered 2020-11-04 – 2020-11-05 (×3): 500 mg via ORAL
  Filled 2020-11-04 (×3): qty 1

## 2020-11-04 MED ORDER — FENTANYL CITRATE (PF) 100 MCG/2ML IJ SOLN
INTRAMUSCULAR | Status: AC
  Filled 2020-11-04: qty 2

## 2020-11-04 MED ORDER — IOHEXOL 350 MG/ML SOLN
INTRAVENOUS | Status: AC
  Filled 2020-11-04: qty 1

## 2020-11-04 MED ORDER — HEPARIN SODIUM (PORCINE) 1000 UNIT/ML IJ SOLN
INTRAMUSCULAR | Status: AC
  Filled 2020-11-04: qty 1

## 2020-11-04 MED ORDER — SODIUM CHLORIDE 0.9 % IV SOLN: 250.0000 mL | INTRAVENOUS | Status: DC | PRN

## 2020-11-04 MED ORDER — ACETAMINOPHEN 325 MG PO TABS
650.0000 mg | ORAL_TABLET | ORAL | Status: DC | PRN
  Administered 2020-11-04: 650 mg via ORAL

## 2020-11-04 MED ORDER — CLOPIDOGREL BISULFATE 75 MG PO TABS
75.0000 mg | ORAL_TABLET | Freq: Every day | ORAL | Status: DC
  Administered 2020-11-05 – 2020-11-06 (×2): 75 mg via ORAL
  Filled 2020-11-04 (×2): qty 1

## 2020-11-04 MED ORDER — ROSUVASTATIN CALCIUM 20 MG PO TABS
20.0000 mg | ORAL_TABLET | Freq: Every day | ORAL | Status: DC
  Administered 2020-11-05 – 2020-11-06 (×2): 20 mg via ORAL
  Filled 2020-11-04 (×2): qty 1

## 2020-11-04 MED ORDER — VERAPAMIL HCL 2.5 MG/ML IV SOLN
INTRAVENOUS | Status: DC | PRN
  Administered 2020-11-04: 10 mL via INTRA_ARTERIAL

## 2020-11-04 MED ORDER — MIDAZOLAM HCL 2 MG/2ML IJ SOLN
INTRAMUSCULAR | Status: AC
  Filled 2020-11-04: qty 2

## 2020-11-04 MED ORDER — LIDOCAINE HCL (PF) 1 % IJ SOLN
INTRAMUSCULAR | Status: DC | PRN
  Administered 2020-11-04: 2 mL via INTRADERMAL

## 2020-11-04 MED ORDER — SODIUM CHLORIDE 0.9% FLUSH: 3.0000 mL | INTRAVENOUS | Status: DC | PRN

## 2020-11-04 MED ORDER — ACETAMINOPHEN 325 MG PO TABS
ORAL_TABLET | ORAL | Status: AC
  Filled 2020-11-04: qty 2

## 2020-11-04 MED ORDER — ONDANSETRON HCL 4 MG/2ML IJ SOLN: 4.0000 mg | Freq: Four times a day (QID) | INTRAMUSCULAR | Status: DC | PRN

## 2020-11-04 MED ORDER — SODIUM CHLORIDE 0.9% FLUSH
3.0000 mL | Freq: Two times a day (BID) | INTRAVENOUS | Status: DC
  Administered 2020-11-04 – 2020-11-06 (×4): 3 mL via INTRAVENOUS

## 2020-11-04 SURGICAL SUPPLY — 22 items
BAG SNAP BAND KOVER 36X36 (MISCELLANEOUS) ×2 IMPLANT
BALLN SAPPHIRE ~~LOC~~ 3.75X12 (BALLOONS) ×2 IMPLANT
CATH OPTICROSS HD (CATHETERS) ×2 IMPLANT
CATH VISTA GUIDE 6FR XBLAD3.5 (CATHETERS) ×2 IMPLANT
COVER DOME SNAP 22 D (MISCELLANEOUS) ×2 IMPLANT
CROWN DIAMONDBACK CLASSIC 1.25 (BURR) ×2 IMPLANT
DEVICE RAD COMP TR BAND LRG (VASCULAR PRODUCTS) ×2 IMPLANT
ELECT DEFIB PAD ADLT CADENCE (PAD) ×2 IMPLANT
GLIDESHEATH SLEND SS 6F .021 (SHEATH) ×2 IMPLANT
GUIDEWIRE INQWIRE 1.5J.035X260 (WIRE) ×1 IMPLANT
INQWIRE 1.5J .035X260CM (WIRE) ×2
KIT ENCORE 26 ADVANTAGE (KITS) ×2 IMPLANT
KIT HEART LEFT (KITS) ×2 IMPLANT
LUBRICANT VIPERSLIDE CORONARY (MISCELLANEOUS) ×2 IMPLANT
PACK CARDIAC CATHETERIZATION (CUSTOM PROCEDURE TRAY) ×2 IMPLANT
SLED PULL BACK IVUS (MISCELLANEOUS) ×2 IMPLANT
STENT SYNERGY XD 3.50X16 (Permanent Stent) ×1 IMPLANT
SYNERGY XD 3.50X16 (Permanent Stent) ×2 IMPLANT
TRANSDUCER W/STOPCOCK (MISCELLANEOUS) ×2 IMPLANT
TUBING CIL FLEX 10 FLL-RA (TUBING) ×2 IMPLANT
WIRE ASAHI PROWATER 180CM (WIRE) ×2 IMPLANT
WIRE VIPERWIRE COR FLEX .012 (WIRE) ×2 IMPLANT

## 2020-11-04 NOTE — Interval H&P Note (Signed)
History and Physical Interval Note:  11/04/2020 10:00 AM  John Gamble  has presented today for surgery, with the diagnosis of cad.  The various methods of treatment have been discussed with the patient and family. After consideration of risks, benefits and other options for treatment, the patient has consented to  Procedure(s): CORONARY STENT INTERVENTION (N/A) as a surgical intervention.  The patient's history has been reviewed, patient examined, no change in status, stable for surgery.  I have reviewed the patient's chart and labs.  Questions were answered to the patient's satisfaction.    Cath Lab Visit (complete for each Cath Lab visit)  Clinical Evaluation Leading to the Procedure:   ACS: No.  Non-ACS:    Anginal Classification: CCS III  Anti-ischemic medical therapy: Maximal Therapy (2 or more classes of medications)  Non-Invasive Test Results: No non-invasive testing performed  Prior CABG: No previous CABG       John Gamble [facility] 11/04/2020 10:01 AM

## 2020-11-04 NOTE — Progress Notes (Signed)
TR BAND REMOVAL  LOCATION:  right radial  DEFLATED PER PROTOCOL:  Yes.    TIME BAND OFF / DRESSING APPLIED:   1630   SITE UPON ARRIVAL:   Level 0  SITE AFTER BAND REMOVAL:  Level 0  CIRCULATION SENSATION AND MOVEMENT:  Within Normal Limits  Yes.    COMMENTS:

## 2020-11-05 ENCOUNTER — Encounter (HOSPITAL_COMMUNITY): Payer: Self-pay | Admitting: Cardiology

## 2020-11-05 ENCOUNTER — Ambulatory Visit (HOSPITAL_BASED_OUTPATIENT_CLINIC_OR_DEPARTMENT_OTHER): Payer: Medicare Other

## 2020-11-05 DIAGNOSIS — I200 Unstable angina: Secondary | ICD-10-CM | POA: Diagnosis present

## 2020-11-05 DIAGNOSIS — I5043 Acute on chronic combined systolic (congestive) and diastolic (congestive) heart failure: Secondary | ICD-10-CM

## 2020-11-05 DIAGNOSIS — Z96652 Presence of left artificial knee joint: Secondary | ICD-10-CM | POA: Diagnosis not present

## 2020-11-05 DIAGNOSIS — I25110 Atherosclerotic heart disease of native coronary artery with unstable angina pectoris: Secondary | ICD-10-CM | POA: Diagnosis not present

## 2020-11-05 DIAGNOSIS — I110 Hypertensive heart disease with heart failure: Secondary | ICD-10-CM | POA: Diagnosis not present

## 2020-11-05 LAB — ECHOCARDIOGRAM COMPLETE
AR max vel: 2.9 cm2
AV Area VTI: 3.61 cm2
AV Area mean vel: 3.04 cm2
AV Mean grad: 3 mmHg
AV Peak grad: 5.7 mmHg
Ao pk vel: 1.19 m/s
Calc EF: 34.9 %
Height: 73 in
S' Lateral: 4.5 cm
Single Plane A2C EF: 30.4 %
Single Plane A4C EF: 40 %
Weight: 2838.4 oz

## 2020-11-05 LAB — URINALYSIS, ROUTINE W REFLEX MICROSCOPIC
Bacteria, UA: NONE SEEN
Bilirubin Urine: NEGATIVE
Glucose, UA: >=500 mg/dL — AB
Ketones, ur: 20 mg/dL — AB
Leukocytes,Ua: NEGATIVE
Nitrite: NEGATIVE
Protein, ur: 30 mg/dL — AB
RBC / HPF: >50 RBC/hpf — ABNORMAL HIGH (ref 0–5)
Specific Gravity, Urine: 1.015 (ref 1.005–1.030)
pH: 5 (ref 5.0–8.0)

## 2020-11-05 LAB — LIPID PANEL
Cholesterol: 91 mg/dL (ref 0–200)
HDL: 35 mg/dL — ABNORMAL LOW (ref >40)
LDL Cholesterol: 40 mg/dL (ref 0–99)
Total CHOL/HDL Ratio: 2.6 RATIO
Triglycerides: 80 mg/dL (ref <150)
VLDL: 16 mg/dL (ref 0–40)

## 2020-11-05 LAB — CBC
HCT: 44.8 % (ref 39.0–52.0)
Hemoglobin: 15.4 g/dL (ref 13.0–17.0)
MCH: 31 pg (ref 26.0–34.0)
MCHC: 34.4 g/dL (ref 30.0–36.0)
MCV: 90.1 fL (ref 80.0–100.0)
Platelets: 289 10*3/uL (ref 150–400)
RBC: 4.97 MIL/uL (ref 4.22–5.81)
RDW: 13.2 % (ref 11.5–15.5)
WBC: 12.4 10*3/uL — ABNORMAL HIGH (ref 4.0–10.5)
nRBC: 0 % (ref 0.0–0.2)

## 2020-11-05 LAB — BASIC METABOLIC PANEL
Anion gap: 10 (ref 5–15)
BUN: 15 mg/dL (ref 8–23)
CO2: 20 mmol/L — ABNORMAL LOW (ref 22–32)
Calcium: 8.9 mg/dL (ref 8.9–10.3)
Chloride: 105 mmol/L (ref 98–111)
Creatinine, Ser: 0.93 mg/dL (ref 0.61–1.24)
GFR, Estimated: >60 mL/min (ref >60)
Glucose, Bld: 256 mg/dL — ABNORMAL HIGH (ref 70–99)
Potassium: 3.4 mmol/L — ABNORMAL LOW (ref 3.5–5.1)
Sodium: 135 mmol/L (ref 135–145)

## 2020-11-05 LAB — HEMOGLOBIN A1C
Hgb A1c MFr Bld: 6.6 % — ABNORMAL HIGH (ref 4.8–5.6)
Mean Plasma Glucose: 142.72 mg/dL

## 2020-11-05 LAB — MAGNESIUM: Magnesium: 2.1 mg/dL (ref 1.7–2.4)

## 2020-11-05 LAB — LACTIC ACID, PLASMA: Lactic Acid, Venous: 1.7 mmol/L (ref 0.5–1.9)

## 2020-11-05 MED ORDER — SODIUM CHLORIDE 0.9 % IV SOLN
1.0000 g | INTRAVENOUS | Status: DC
  Administered 2020-11-05: 1 g via INTRAVENOUS
  Filled 2020-11-05 (×2): qty 10

## 2020-11-05 MED ORDER — METOPROLOL SUCCINATE ER 25 MG PO TB24
12.5000 mg | ORAL_TABLET | Freq: Every day | ORAL | Status: DC
  Administered 2020-11-05 – 2020-11-06 (×2): 12.5 mg via ORAL
  Filled 2020-11-05 (×2): qty 1

## 2020-11-05 MED ORDER — FINASTERIDE 5 MG PO TABS
5.0000 mg | ORAL_TABLET | Freq: Every day | ORAL | Status: DC
  Administered 2020-11-05 – 2020-11-06 (×2): 5 mg via ORAL
  Filled 2020-11-05 (×2): qty 1

## 2020-11-05 MED ORDER — POTASSIUM CHLORIDE CRYS ER 20 MEQ PO TBCR
60.0000 meq | EXTENDED_RELEASE_TABLET | Freq: Once | ORAL | Status: AC
  Administered 2020-11-05: 60 meq via ORAL
  Filled 2020-11-05: qty 3

## 2020-11-05 MED ORDER — SODIUM CHLORIDE 0.9 % IR SOLN: 3000.0000 mL | Status: DC

## 2020-11-05 MED ORDER — SPIRONOLACTONE 12.5 MG HALF TABLET
12.5000 mg | ORAL_TABLET | Freq: Every day | ORAL | Status: DC
  Administered 2020-11-05 – 2020-11-06 (×2): 12.5 mg via ORAL
  Filled 2020-11-05 (×2): qty 1

## 2020-11-05 MED ORDER — ALFUZOSIN HCL ER 10 MG PO TB24
10.0000 mg | ORAL_TABLET | Freq: Every day | ORAL | Status: DC
  Administered 2020-11-05 – 2020-11-06 (×2): 10 mg via ORAL
  Filled 2020-11-05 (×3): qty 1

## 2020-11-05 MED ORDER — CHLORHEXIDINE GLUCONATE CLOTH 2 % EX PADS: 6.0000 | MEDICATED_PAD | Freq: Every day | CUTANEOUS | Status: DC

## 2020-11-05 MED FILL — Heparin Sod (Porcine)-NaCl IV Soln 1000 Unit/500ML-0.9%: INTRAVENOUS | Qty: 1000 | Status: AC

## 2020-11-05 NOTE — Progress Notes (Signed)
   Straight catheterization produced 750cc urine with RN reporting 4-5 large clots. Repeat UA/UCx obtained. UA with >500 glucose, large blood, moderate ketones, and 3+ protein. No evidence of bacteria. UCx pending. With recent UTI, decision made to transition from keflex to CTX. Spoke with urology who plans to see later today. Will place 3-way bladder catheter and plan to start continuous irrigation. Additionally recommended to start alfuzosin 10mg  daily given allergy to sulfa.   Will plan to monitor overnight. DAPT to be continued in light of stent placement 11/04/20.   Abigail Butts, PA-C 11/05/20; 1:28 PM

## 2020-11-05 NOTE — TOC Benefit Eligibility Note (Signed)
Transition of Care [facility]) Benefit Eligibility Note    Patient Details  Name: PERLIE SCHEURING MRN: [id number] Date of Birth: 18-Jun-1931   Medication/Dose: Wilder Glade- 10mg . Daily  , Entresto49-51mg . bid  Covered?: Yes  Tier: 3 Drug  Prescription Coverage [facility]erred [facility]  Spoke with Person/Company/Phone Number:: Rosanne Ashing  [employer] [phone]  Co-Pay: $141.00(Farxiga)$47.00Delene Loll )  Prior Approval: No  Deductible:  (No Deductible)       [name] Phone Number: 11/05/2020, 11:06 AM

## 2020-11-05 NOTE — Progress Notes (Addendum)
Progress Note  Patient Name: John Gamble Date of Encounter: 11/05/2020  [facility] Cardiologist: Peter [name], MD   Subjective   Reports difficulty urinating overnight - hesitancy and mild dysuria. No complaints of chest pain or SOB. Denies palpitations.   Inpatient Medications    Scheduled Meds: . acyclovir  800 mg Oral Daily  . aspirin EC  81 mg Oral QAC supper  . cephALEXin  500 mg Oral QID  . clopidogrel  75 mg Oral Daily  . dapagliflozin propanediol  10 mg Oral QAC breakfast  . dorzolamide  1 drop Right Eye QHS  . metoprolol succinate  12.5 mg Oral Daily  . multivitamin with minerals  1 tablet Oral Daily  . omega-3 acid ethyl esters  1 g Oral Daily  . prednisoLONE acetate  1 drop Left Eye Daily  . rosuvastatin  20 mg Oral Daily  . sacubitril-valsartan  1 tablet Oral BID  . sodium chloride flush  3 mL Intravenous Q12H  . sodium chloride flush  3 mL Intravenous Q12H  . spironolactone  12.5 mg Oral Daily   Continuous Infusions: . sodium chloride     PRN Meds: sodium chloride, acetaminophen, ondansetron (ZOFRAN) IV, polyethylene glycol, sodium chloride flush   Vital Signs    Vitals:   11/05/20 0033 11/05/20 0533 11/05/20 0547 11/05/20 0809  BP: (!) 146/94  (!) 133/93 (!) 145/117  Pulse: (!) 102  (!) 110 (!) 112  Resp: 20  20 18   Temp: (!) 97.4 F (36.3 C)  97.9 F (36.6 C) (!) 97.3 F (36.3 C)  TempSrc: Oral Oral Oral Oral  SpO2: 93%  95%   Weight:   80.5 kg   Height:        Intake/Output Summary (Last 24 hours) at 11/05/2020 2426 Last data filed at 11/04/2020 2343 Gross per 24 hour  Intake --  Output 250 ml  Net -250 ml   Last 3 Weights 11/05/2020 11/04/2020 11/04/2020  Weight (lbs) 177 lb 6.4 oz 178 lb 4.8 oz 180 lb  Weight (kg) 80.468 kg 80.876 kg 81.647 kg      Telemetry    Sinus rhythm/sinus tachycardia with frequent ectopy - episodes of ventricular bigeminy, isolated and coupled PVCs, and several brief runs of NSVT (max 7 beats) - Personally  Reviewed  ECG    Sinus tachycardia, rate 111 bpm, frequent PVCs/PACs, no STE/D - Personally Reviewed  Physical Exam   GEN: Sitting in bedside chair in no acute distress.   Neck: No JVD Cardiac: IRIR, no murmurs, rubs, or gallops. Right radial cath side without ecchymosis or hematoma, dressing C/D/I Respiratory: Clear to auscultation bilaterally. GI: Soft, nontender, non-distended  MS: No edema; No deformity. Neuro:  Nonfocal  Psych: Normal affect   Labs    High Sensitivity Troponin:  No results for input(s): TROPONINIHS in the last 720 hours.    Chemistry Recent Labs  Lab 10/29/20 1052 11/05/20 0243  NA 139 135  K 4.1 3.4*  CL 111 105  CO2 22 20*  GLUCOSE 169* 256*  BUN 22 15  CREATININE 1.01 0.93  CALCIUM 9.0 8.9  PROT 5.8*  --   ALBUMIN 3.6  --   AST 19  --   ALT 16  --   ALKPHOS 70  --   BILITOT 0.9  --   GFRNONAA >60 >60  ANIONGAP 6 10     Hematology Recent Labs  Lab 11/05/20 0243  WBC 12.4*  RBC 4.97  HGB 15.4  HCT  44.8  MCV 90.1  MCH 31.0  MCHC 34.4  RDW 13.2  PLT 289    BNPNo results for input(s): BNP, PROBNP in the last 168 hours.   DDimer No results for input(s): DDIMER in the last 168 hours.   Radiology    CARDIAC CATHETERIZATION  Result Date: 11/04/2020  Ost LAD to Prox LAD lesion is 95% stenosed.  A drug-eluting stent was successfully placed using a SYNERGY XD 3.50X16.  Post intervention, there is a 0% residual stenosis.  1. Successful PCI of the ostial LAD with IVUS guidance, orbital atherectomy and DES x 1 Plan: DAPT for at least 6 months. Will observe overnight and anticipate DC in am.    Cardiac Studies   Coronary stent intervention 11/04/20:  Ost LAD to Prox LAD lesion is 95% stenosed.  A drug-eluting stent was successfully placed using a SYNERGY XD 3.50X16.  Post intervention, there is a 0% residual stenosis.  1. Successful PCI of the ostial LAD with IVUS guidance, orbital atherectomy and DES x 1  Plan: DAPT for at  least 6 months. Will observe overnight and anticipate DC in am  Patient Profile     87 y.o. male with a PMH of CAD s/p stenting to LCx in 2000, recently diagnosed chronic combined CHF, HTN, HLD, and OA, who presented for outpatient coronary intervention.   Assessment & Plan    1. CAD s/p PCI to LCx in 2000 and now PCI/DES to LAD 11/04/20: patient presented for planned staged intervention to LAD following recent [facility] 10/27/20 to further evaluate an abnormal NST. He underwent successful orbital atherectomy and DES to ostial LAD. He was recommended to continue aspirin and plavix uninterrupted for a minimum of 6 months. Right radial cath site was without hematoma with dressing C/D/I on the day of discharge. Historically not on a BBlocker due to bradycardia on Toprol-XL, though now with sinus tachycardia with PVCs/PACs. - Continue aspirin and plavix - Continue statin - Will start BBlocker as below  2. Chronic combined CHF/ICM: recently diagnosed on NST with EF 22%, confirmed on R/LHC to have estimated EF 25-35%. He is scheduled for a formal echocardiogram outpatient 11/20/20. Recently started on entresto and farxiga, though not on BBlocker due to documented history of bradycardia on Toprol-XL. He has had persistent sinus tachycardia with frequent ectopy on tele this admission. He has no volume overload complaints and appears euvolemic on exam.  - Will attempt to get echo prior to discharge today - Will re-challenge low dose BBlocker - start metoprolol succinate 12.5mg  daily - Will start spironolactone 12.5mg  daily given suspected EF <40% - Continue entresto and farxiga  3. HTN: BP intermittently elevated this admission. Decision made to start metoprolol succinate 12.5mg  daily given frequent ectopy and recently diagnosed cardiomyopathy, as well as low dose spironolactone given suspected EF <40%.  - Start metoprolol succinate 12.5mg  daiy - Start spironolactone 12.5mg  daily - Continue entresto - Will  need repeat BMET outpatient for close monitoring of electrolytes and kidney function  4. HLD: LDL 40 this admission; at goal of <70. Prior intolerance to atorvastatin - Continue crestor  5. New diagnosis of DM type 2: A1C 6.6 this admission. Recently started on farxiga - Recommend close PCP follow-up - Will need repeat A1C in 3 months - can be coordinated by PCP  6. Recent UTI: patient recently seen in ED for UTI and started on keflex to complete a 10 day course. Reported some urinary hesitancy and mild dysuria overnight, though no recurrent hematuria.  -  Will repeat UA/UCx to ensure sensitivity to keflex given evidence of polymicrobial bacteria on UCx 10/29/20 - Will check bladder scan - Continue keflex for now   Anticipate discharge home later this afternoon pending above.   For questions or updates, please contact [place] Please consult [email or web address] for contact info under        Signed, Abigail Butts, PA-C  11/05/2020, 9:22 AM    Patient seen and examined and agree with Roby Lofts, PA-C as detailed above.  In brief, the patient is a 87 y.o. male with a PMH of CAD s/p stenting to LCx in 2000, recently diagnosed chronic combined CHF, HTN, HLD, and OA, who presented for planned PCI with orbital atherectomy to LAD.   Currently, the patient is complaining of significant suprapubic pain with urinary retention on bladder scan. Was unable to void overnight. Notably has recent UTI for which he has been on keflex. Tele with frequent ectopy with PVCs and NSVT. Otherwise no chest pain or SOB. Cath site c/d/i.  GEN: NAD  Neck: No JVD Cardiac: Irregularly irregular, no murmurs, rubs, or gallops. Right radial site c/d/i Respiratory: Clear to auscultation bilaterally. GI: soft, tender in the suprapubic region MS: No edema; No deformity. Neuro:  Nonfocal  Psych: Normal affect   Plan: -S/p successful orbital atherectomy with PCI to LAD -Continue aspirin and plavix -Start  low dose beta blocker and monitor for bradycardia as patient is having frequent ventricular ectopy on the monitor -Continue crestor -Check TTE for newly diagnosed HFrEF -Continue entresto and start spironolactone 12.5mg  daily -Stop farxiga for now given UTI; can consider re-challenging as out-patient  -Given urinary retention, will straight cath, check UA and start finasteride (has allergy to sulfa and therefore cannot take flomax) -May need to change ABX for underlying UTI -Will need to be able to void on his own prior to discharge  Gwyndolyn Kaufman, MD

## 2020-11-05 NOTE — Progress Notes (Signed)
CARDIAC REHAB PHASE I   PRE:  Rate/Rhythm: 11 ST with PVCs  BP:  Sitting: 123/80      SaO2: 96 RA   Went to offer to walk with pt. Pt arousable to loud stimuli but shortly falls back to sleep. Pt not appropriate for ambulation or education at this time. Will f/u later as appropriate.  [id number] Rufina Falco, RN BSN 11/05/2020 10:18 AM

## 2020-11-05 NOTE — Progress Notes (Signed)
  Echocardiogram 2D Echocardiogram has been performed.  John Gamble 11/05/2020, 3:03 PM

## 2020-11-05 NOTE — Discharge Summary (Addendum)
Discharge Summary    Patient ID: CAYLIN RABY MRN: [id number]; DOB: 10-16-1930  Admit date: 11/04/2020 Discharge date: 11/06/2020  PCP:  Algis Greenhouse, MD   [place]  Cardiologist:  Peter [name], MD  Advanced Practice Provider:  No care team member to display Electrophysiologist:  None   Discharge Diagnoses    Principal Problem:   Acute on chronic combined systolic and diastolic CHF (congestive heart failure) [facility]) Active Problems:   Coronary artery disease   Hypertension   Hyperlipidemia   Unstable angina [facility])   Urinary retention   Hematuria    Diagnostic Studies/Procedures    Coronary stent intervention 11/04/20: Ost LAD to Prox LAD lesion is 95% stenosed. A drug-eluting stent was successfully placed using a SYNERGY XD 3.50X16. Post intervention, there is a 0% residual stenosis.   1. Successful PCI of the ostial LAD with IVUS guidance, orbital atherectomy and DES x 1   Plan: DAPT for at least 6 months. Will observe overnight and anticipate DC in am.   _____________   History of Present Illness     AMITAI DELAUGHTER is a 87 y.o. male with a PMH of CAD s/p stenting to LCx in 2000, recently diagnosed chronic combined CHF, HTN, HLD, and OA. He was seen outpatient by Dr. [name] 10/05/20 with complaints of DOE which were felt to be possibly related to his COVID-19 infection the year prior. He had no complaints of chest pain at the time. He was recommended to undergo a NST which occurred 10/21/20 showing EF 22% and findings c/w prior large anterior apical MI but no ischemia. He underwent a [facility] 10/27/20 which showed severe 2-vessel obstructive CAD with 90% ostial LAD stenosis, 70% 1st diagonal stenosis, 90% ostial non-dominant RCA stenosis, and patent pLCx and left PDA stents; EF estimated to be 25-35% with normal LVEDP and right heart pressures. He was planned for a staged complex PCI of the ostial LAD with atherectomy and stenting with plans to medically  managed RCA and diagonal stenosis. He presented 11/04/20 for staged intervention.   Hospital Course     Consultants: None   1. CAD s/p PCI to LCx in 2000 and now PCI/DES to LAD 11/04/20: patient presented for planned staged intervention to LAD following recent [facility] 10/27/20 to further evaluate an abnormal NST. He underwent successful orbital atherectomy and DES to ostial LAD. He was recommended to continue aspirin and plavix uninterrupted for a minimum of 6 months. Right radial cath site was without hematoma with dressing C/D/I on the day of discharge. Historically not on a BBlocker due to bradycardia on Toprol-XL, though now with sinus tachycardia with PVCs/PACs. - Continue aspirin and plavix - Continue statin - Continue BBlocker   2. Chronic combined CHF/ICM: recently diagnosed on NST with EF 22%, confirmed on R/LHC to have estimated EF 25-35%. Echo 11/05/20 showed EF 25-30%, moderately dilated LV, mild LVH, indeterminate LV diastolic function, and mild MR. Recently started on entresto and farxiga, though not on BBlocker due to documented history of bradycardia on Toprol-XL. He has had persistent sinus tachycardia with frequent ectopy on tele this admission and was restarted on low dose metoprolol succinate 12.5mg  daily.Additionally spironolactone 12.5mg  daily was started yesterday given EF <40. He has no volume overload complaints and appears euvolemic on exam.  - Continue metoprolol succinate, entresto, and spironolactone - Farxiga on hold in the setting of recent UTI and ongoing hematuria   3. HTN: BP intermittently elevated this admission. Decision made to start  metoprolol succinate 12.5mg  daily given frequent ectopy and recently diagnosed cardiomyopathy, as well as low dose spironolactone given suspected EF <40%.  - Managed in the context of #2. - Will need repeat BMET outpatient for close monitoring of electrolytes and kidney function   4. HLD: LDL 40 this admission; at goal of <70. Prior  intolerance to atorvastatin - Continue crestor   5. New diagnosis of DM type 2: A1C 6.6 this admission. Recently started on farxiga, though now on hold given recent UTI.  - Recommend close PCP follow-up - Can consider re-challenging farxiga outpatient.    6. Recent UTI with ongoing hematuria and urinary retention: patient recently seen in ED for UTI and started on keflex to complete a 10 day course. Reported some urinary hesitancy and mild dysuria overnight after [facility] 11/04/20. Straight cath with 750cc output with several clots observed. UA with large blood, glucose, and protein, though no bacteria seen. UCx with NGTD. Antibiotics were broadened to CTX briefly with plans to resume po keflex at discharge to complete a 10 day course. Discussed with Urology and 3 way foley placed for continuous irrigation overnight. Urology consulted and suspected urinary retention was likely post-procedural complication related to his BPH. Hematuria was attributed to traumatic in-and-out cath. No further clots with irrigation and urine remained clear after stopping irrigation. He was cleared for discharge home with close outpatient follow-up for TOV.  - Dr. Milford Cage updated to plans to discharge today. Urology to arrange outpatient follow-up. - PO keflex continued at discharge to complete a 10 day course - last dose 11/08/20.   Did the patient have an acute coronary syndrome (MI, NSTEMI, STEMI, etc) this admission?:  No                               Did the patient have a percutaneous coronary intervention (stent / angioplasty)?:  Yes.     Cath/PCI Registry Performance & Quality Measures: Aspirin prescribed? - Yes ADP Receptor Inhibitor (Plavix/Clopidogrel, Brilinta/Ticagrelor or Effient/Prasugrel) prescribed (includes medically managed patients)? - Yes High Intensity Statin (Lipitor 40-80mg  or Crestor 20-40mg ) prescribed? - Yes For EF <40%, was ACEI/ARB prescribed? - Yes For EF <40%, Aldosterone Antagonist  (Spironolactone or Eplerenone) prescribed? - Yes Cardiac Rehab Phase II ordered? - Yes       _____________  Discharge Vitals Blood pressure 119/68, pulse (!) 111, temperature 98.1 F (36.7 C), temperature source Oral, resp. rate 18, height 6\' 1"  (1.854 m), weight 80.5 kg, SpO2 95 %.  Filed Weights   11/04/20 0853 11/04/20 1643 11/05/20 0547  Weight: 81.6 kg 80.9 kg 80.5 kg    Labs & Radiologic Studies    CBC Recent Labs    11/05/20 0243 11/06/20 0756  WBC 12.4* 10.7*  NEUTROABS  --  8.1*  HGB 15.4 14.1  HCT 44.8 41.6  MCV 90.1 91.6  PLT 289 637   Basic Metabolic Panel Recent Labs    11/05/20 0243 11/06/20 0756  NA 135 139  K 3.4* 4.3  CL 105 108  CO2 20* 19*  GLUCOSE 256* 157*  BUN 15 23  CREATININE 0.93 1.20  CALCIUM 8.9 9.1  MG 2.1  --    Liver Function Tests No results for input(s): AST, ALT, ALKPHOS, BILITOT, PROT, ALBUMIN in the last 72 hours. No results for input(s): LIPASE, AMYLASE in the last 72 hours. High Sensitivity Troponin:   No results for input(s): TROPONINIHS in the last 720 hours.  BNP  Invalid input(s): POCBNP D-Dimer No results for input(s): DDIMER in the last 72 hours. Hemoglobin A1C Recent Labs    11/05/20 0243  HGBA1C 6.6*   Fasting Lipid Panel Recent Labs    11/05/20 0243  CHOL 91  HDL 35*  LDLCALC 40  TRIG 80  CHOLHDL 2.6   Thyroid Function Tests No results for input(s): TSH, T4TOTAL, T3FREE, THYROIDAB in the last 72 hours.  Invalid input(s): FREET3 _____________  CARDIAC CATHETERIZATION  Result Date: 11/04/2020  Ost LAD to Prox LAD lesion is 95% stenosed.  A drug-eluting stent was successfully placed using a SYNERGY XD 3.50X16.  Post intervention, there is a 0% residual stenosis.  1. Successful PCI of the ostial LAD with IVUS guidance, orbital atherectomy and DES x 1 Plan: DAPT for at least 6 months. Will observe overnight and anticipate DC in am.   CARDIAC CATHETERIZATION  Result Date: 10/28/2020  Ost LAD to  Prox LAD lesion is 95% stenosed.  1st Diag lesion is 70% stenosed.  Previously placed Ost Cx to Prox Cx stent (unknown type) is widely patent.  Previously placed LPDA stent (unknown type) is widely patent.  Ost RCA to Prox RCA lesion is 90% stenosed.  There is severe left ventricular systolic dysfunction.  LV end diastolic pressure is normal.  The left ventricular ejection fraction is 25-35% by visual estimate.  1. Severe 2 vessel obstructive CAD.    - 90% ostial LAD. Severely calcified. 70% first diagonal    - patent stents in the proximal LCx and left PDA    - 90% ostial nondominant RCA 2. Severe LV dysfunction with regional wall motion abnormalities. 3. Normal LV filling pressures 4. Normal right heart pressures. 5. Preserved cardiac output. Plan: recommend staged complex  PCI of the ostial LAD with atherectomy and stenting. The diagonal and RCA disease are old and involve small vessels. While CABG would be an option given his advanced age I would favor a percutaneous approach.   Myocardial Perfusion Imaging  Result Date: 10/21/2020  Nuclear stress EF: 22%. The left ventricular ejection fraction is severely decreased (<30%).  Defect 1: There is a defect present in the mid anterior, mid anteroseptal, mid inferolateral, mid anterolateral, apical anterior, apical inferior, apical lateral and apex location.  Findings consistent with prior large anterior apical myocardial infarction.  This is a high risk study. There is no evidence of ischemia . He has had a previous anterior apical MI    ECHOCARDIOGRAM COMPLETE  Result Date: 11/05/2020    ECHOCARDIOGRAM REPORT   Patient Name:   KAMAREE WHEATLEY Date of Exam: 11/05/2020 Medical Rec #:  [id number]    Height:       73.0 in Accession #:    [id number]   Weight:       177.4 lb Date of Birth:  26-Mar-1931    BSA:          2.045 m Patient Age:    42 years     BP:           145/117 mmHg Patient Gender: M            HR:           110 bpm. Exam Location:  Inpatient  Procedure: 2D Echo, Cardiac Doppler and Color Doppler Indications:    Congestive heart failure  History:        Patient has no prior history of Echocardiogram examinations.  CHF, CAD; Risk Factors:Hypertension, Dyslipidemia and Diabetes.                 S/p PCI. Ischemic cardiomyopathy.  Sonographer:    Clayton Lefort RDCS (AE) Referring Phys: [id number] Onycha  1. Diffuse hypokinesis worse in inferior base with abnormal septal motion . Left ventricular ejection fraction, by estimation, is 25 to 30%. The left ventricle has severely decreased function. The left ventricle demonstrates global hypokinesis. The left  ventricular internal cavity size was moderately dilated. There is mild left ventricular hypertrophy. Left ventricular diastolic parameters are indeterminate.  2. Right ventricular systolic function is normal. The right ventricular size is normal. There is normal pulmonary artery systolic pressure.  3. The mitral valve is normal in structure. Mild mitral valve regurgitation. No evidence of mitral stenosis.  4. The aortic valve is tricuspid. Aortic valve regurgitation is trivial. No aortic stenosis is present.  5. The inferior vena cava is dilated in size with >50% respiratory variability, suggesting right atrial pressure of 8 mmHg. FINDINGS  Left Ventricle: Diffuse hypokinesis worse in inferior base with abnormal septal motion. Left ventricular ejection fraction, by estimation, is 25 to 30%. The left ventricle has severely decreased function. The left ventricle demonstrates global hypokinesis. 3D left ventricular ejection fraction analysis performed but not reported based on interpreter judgement due to suboptimal quality. The left ventricular internal cavity size was moderately dilated. There is mild left ventricular hypertrophy.  Left ventricular diastolic parameters are indeterminate. Right Ventricle: The right ventricular size is normal. No increase in right ventricular wall  thickness. Right ventricular systolic function is normal. There is normal pulmonary artery systolic pressure. The tricuspid regurgitant velocity is 2.27 m/s, and  with an assumed right atrial pressure of 15 mmHg, the estimated right ventricular systolic pressure is 08.6 mmHg. Left Atrium: Left atrial size was normal in size. Right Atrium: Right atrial size was normal in size. Pericardium: There is no evidence of pericardial effusion. Mitral Valve: The mitral valve is normal in structure. There is mild thickening of the mitral valve leaflet(s). Mild mitral valve regurgitation. No evidence of mitral valve stenosis. Tricuspid Valve: The tricuspid valve is normal in structure. Tricuspid valve regurgitation is mild . No evidence of tricuspid stenosis. Aortic Valve: The aortic valve is tricuspid. Aortic valve regurgitation is trivial. No aortic stenosis is present. Aortic valve mean gradient measures 3.0 mmHg. Aortic valve peak gradient measures 5.7 mmHg. Aortic valve area, by VTI measures 3.61 cm. Pulmonic Valve: The pulmonic valve was normal in structure. Pulmonic valve regurgitation is not visualized. No evidence of pulmonic stenosis. Aorta: The aortic root is normal in size and structure. Venous: The inferior vena cava is dilated in size with greater than 50% respiratory variability, suggesting right atrial pressure of 8 mmHg. IAS/Shunts: No atrial level shunt detected by color flow Doppler.  LEFT VENTRICLE PLAX 2D LVIDd:         5.40 cm LVIDs:         4.50 cm LV PW:         1.60 cm LV IVS:        1.40 cm LVOT diam:     2.20 cm LV SV:         71 LV SV Index:   35 LVOT Area:     3.80 cm  LV Volumes (MOD) LV vol d, MOD A2C: 134.0 ml LV vol d, MOD A4C: 165.0 ml LV vol s, MOD A2C: 93.3 ml LV vol s, MOD A4C: 99.0  ml LV SV MOD A2C:     40.7 ml LV SV MOD A4C:     165.0 ml LV SV MOD BP:      52.4 ml RIGHT VENTRICLE             IVC RV Basal diam:  3.20 cm     IVC diam: 2.00 cm RV S prime:     12.50 cm/s TAPSE (M-mode): 1.2  cm LEFT ATRIUM             Index       RIGHT ATRIUM           Index LA diam:        4.00 cm 1.96 cm/m  RA Area:     12.90 cm LA Vol (A2C):   82.2 ml 40.20 ml/m RA Volume:   23.80 ml  11.64 ml/m LA Vol (A4C):   53.1 ml 25.97 ml/m LA Biplane Vol: 66.9 ml 32.72 ml/m  AORTIC VALVE AV Area (Vmax):    2.90 cm AV Area (Vmean):   3.04 cm AV Area (VTI):     3.61 cm AV Vmax:           119.00 cm/s AV Vmean:          85.800 cm/s AV VTI:            0.198 m AV Peak Grad:      5.7 mmHg AV Mean Grad:      3.0 mmHg LVOT Vmax:         90.70 cm/s LVOT Vmean:        68.700 cm/s LVOT VTI:          0.188 m LVOT/AV VTI ratio: 0.95  AORTA Ao Root diam: 4.10 cm Ao Asc diam:  3.90 cm TRICUSPID VALVE TR Peak grad:   20.6 mmHg TR Vmax:        227.00 cm/s  SHUNTS Systemic VTI:  0.19 m Systemic Diam: 2.20 cm Jenkins Rouge MD Electronically signed by Jenkins Rouge MD Signature Date/Time: 11/05/2020/3:13:33 PM    Final    Disposition   Pt is being discharged home today in good condition.  Follow-up Plans & Appointments     Follow-up Information     [name], Peter M, MD Follow up on 11/12/2020.   Specialty: Cardiology Why: Please arrive 15 minutes early for your 4:20pm post-hospital follow-up appointment. Contact information: [address] [place] [address] [phone]         [facility] Follow up.   Why: Dr. Elmyra Ricks office will contact you with a follow-up appointment to determine when your foley catheter can be removed.  Contact information: [place] [phone]        Algis Greenhouse, MD Follow up.   Specialty: Family Medicine Why: Please call to arrange a follow-up appointment to be seen within 1-2 weeks following discharge to discuss management of your newly diagnosed diabetes. Contact information: [address] [place] [address] [phone]                Discharge Instructions     Amb Referral to  Cardiac Rehabilitation   Complete by: As directed    Will send Cardiac Rehab Phase 2 referral to [place]   Diagnosis: Coronary Stents   After initial evaluation and assessments completed: Virtual Based Care may be provided alone or in conjunction with Phase 2 Cardiac Rehab based on patient barriers.: Yes  Discharge Medications   Allergies as of 11/06/2020       Reactions   Atorvastatin Other (See Comments)   myalgias   Oysters [shellfish Allergy] Nausea Only   Sulfa Antibiotics Nausea And Vomiting        Medication List     STOP taking these medications    amLODipine 5 MG tablet Commonly known as: NORVASC   dapagliflozin propanediol 10 MG Tabs tablet Commonly known as: Farxiga       TAKE these medications    acyclovir 800 MG tablet Commonly known as: ZOVIRAX Take 800 mg by mouth daily.   alfuzosin 10 MG 24 hr tablet Commonly known as: UROXATRAL Take 1 tablet (10 mg total) by mouth daily with breakfast. Start taking on: November 07, 2020   aspirin EC 81 MG tablet Take 81 mg by mouth daily before supper.   cephALEXin 500 MG capsule Commonly known as: KEFLEX Take 1 capsule (500 mg total) by mouth 4 (four) times daily for 10 days.   clopidogrel 75 MG tablet Commonly known as: Plavix Take 1 tablet (75 mg total) by mouth daily.   dorzolamide 2 % ophthalmic solution Commonly known as: TRUSOPT Place 1 drop into the right eye at bedtime.   Entresto 49-51 MG Generic drug: sacubitril-valsartan Take 1 tablet by mouth 2 (two) times daily.   Fish Oil 1200 MG Caps Take 1,200 mg by mouth daily.   Glucosamine-Chondroitin 250-200 MG Tabs Take 2 tablets by mouth 2 (two) times daily.   metoprolol succinate 25 MG 24 hr tablet Commonly known as: TOPROL-XL Take 1/2 tablet (12.5 mg total) by mouth daily. Start taking on: November 07, 2020   multivitamin with minerals Tabs tablet Take 1 tablet by mouth daily. Centrum Silver   OneTouch Delica Plus [id number]  Misc SMARTSIG:1 Unit(s) Topical Daily   OneTouch Ultra test strip Generic drug: glucose blood USE 1 STRIP DAILY.   OneTouch Ultra test strip Generic drug: glucose blood daily.   polyethylene glycol 17 g packet Commonly known as: MIRALAX / GLYCOLAX Take 17 g by mouth daily. Mix with 8 oz liquid and drink   prednisoLONE acetate 1 % ophthalmic suspension Commonly known as: PRED FORTE Place 1 drop into the left eye daily.   rosuvastatin 20 MG tablet Commonly known as: CRESTOR Take 20 mg by mouth daily.   spironolactone 25 MG tablet Commonly known as: ALDACTONE Take 1/2 tablet (12.5 mg total) by mouth daily. Start taking on: November 07, 2020   Zinc 50 MG Caps Take 50 mg by mouth daily with supper.           Outstanding Labs/Studies   Will need BMET at follow-up visit to ensure Cr and electrolyte are stable.   Duration of Discharge Encounter   Greater than 30 minutes including physician time.  Signed, Abigail Butts, PA-C 11/06/2020, 1:45 PM   Patient seen and examined and agree with Roby Lofts, PA-C as detailed above. Please see progress note from today.  Will discharge home with close Cardiology and Urology follow-up.

## 2020-11-05 NOTE — Progress Notes (Signed)
MD called Clair Gulling to see if foley was inserted.  It had not been, due to activity in room with echo, and awaiting for equipment to arrive to unit.  Per order, it was ordered as a 18 fr.  And MD wanted a 20 or 22 g. We will be working on getting the proper size request by Urology.

## 2020-11-05 NOTE — Plan of Care (Signed)
  Problem: Education: Goal: Understanding of CV disease, CV risk reduction, and recovery process will improve Outcome: Not Progressing Goal: Individualized Educational Video(s) Outcome: Not Progressing   Problem: Activity: Goal: Ability to return to baseline activity level will improve Outcome: Not Progressing   Problem: Cardiovascular: Goal: Ability to achieve and maintain adequate cardiovascular perfusion will improve Outcome: Not Progressing Goal: Vascular access site(s) Level 0-1 will be maintained Outcome: Not Progressing   Problem: Health Behavior/Discharge Planning: Goal: Ability to safely manage health-related needs after discharge will improve Outcome: Not Progressing   Problem: Education: Goal: Knowledge of General Education information will improve Description: Including pain rating scale, medication(s)/side effects and non-pharmacologic comfort measures Outcome: Not Progressing   Problem: Health Behavior/Discharge Planning: Goal: Ability to manage health-related needs will improve Outcome: Not Progressing   Problem: Clinical Measurements: Goal: Ability to maintain clinical measurements within normal limits will improve Outcome: Not Progressing Goal: Will remain free from infection Outcome: Not Progressing Goal: Diagnostic test results will improve Outcome: Not Progressing Goal: Respiratory complications will improve Outcome: Not Progressing Goal: Cardiovascular complication will be avoided Outcome: Not Progressing   Problem: Activity: Goal: Risk for activity intolerance will decrease Outcome: Not Progressing   Problem: Nutrition: Goal: Adequate nutrition will be maintained Outcome: Not Progressing   Problem: Coping: Goal: Level of anxiety will decrease Outcome: Not Progressing   Problem: Elimination: Goal: Will not experience complications related to bowel motility Outcome: Not Progressing Goal: Will not experience complications related to urinary  retention Outcome: Not Progressing   Problem: Pain Managment: Goal: General experience of comfort will improve Outcome: Not Progressing   Problem: Safety: Goal: Ability to remain free from injury will improve Outcome: Not Progressing   Problem: Skin Integrity: Goal: Risk for impaired skin integrity will decrease Outcome: Not Progressing

## 2020-11-05 NOTE — Care Management (Signed)
11-05-20 1027 Benefits check submitted for [place]. Case Manager will follow for cost. Graves-Bigelow, Ocie Cornfield, RN,BSN Case Manager

## 2020-11-06 ENCOUNTER — Other Ambulatory Visit (HOSPITAL_COMMUNITY): Payer: Self-pay

## 2020-11-06 ENCOUNTER — Encounter (HOSPITAL_COMMUNITY): Payer: Self-pay | Admitting: Cardiology

## 2020-11-06 DIAGNOSIS — Z955 Presence of coronary angioplasty implant and graft: Secondary | ICD-10-CM | POA: Diagnosis not present

## 2020-11-06 DIAGNOSIS — R339 Retention of urine, unspecified: Secondary | ICD-10-CM

## 2020-11-06 DIAGNOSIS — I2510 Atherosclerotic heart disease of native coronary artery without angina pectoris: Secondary | ICD-10-CM | POA: Diagnosis not present

## 2020-11-06 DIAGNOSIS — R319 Hematuria, unspecified: Secondary | ICD-10-CM

## 2020-11-06 DIAGNOSIS — I25110 Atherosclerotic heart disease of native coronary artery with unstable angina pectoris: Secondary | ICD-10-CM | POA: Diagnosis not present

## 2020-11-06 LAB — BASIC METABOLIC PANEL
Anion gap: 12 (ref 5–15)
BUN: 23 mg/dL (ref 8–23)
CO2: 19 mmol/L — ABNORMAL LOW (ref 22–32)
Calcium: 9.1 mg/dL (ref 8.9–10.3)
Chloride: 108 mmol/L (ref 98–111)
Creatinine, Ser: 1.2 mg/dL (ref 0.61–1.24)
GFR, Estimated: 59 mL/min — ABNORMAL LOW (ref >60)
Glucose, Bld: 157 mg/dL — ABNORMAL HIGH (ref 70–99)
Potassium: 4.3 mmol/L (ref 3.5–5.1)
Sodium: 139 mmol/L (ref 135–145)

## 2020-11-06 LAB — URINE CULTURE: Culture: NO GROWTH

## 2020-11-06 LAB — CBC WITH DIFFERENTIAL/PLATELET
Abs Immature Granulocytes: 0.06 10*3/uL (ref 0.00–0.07)
Basophils Absolute: 0.1 10*3/uL (ref 0.0–0.1)
Basophils Relative: 1 %
Eosinophils Absolute: 0 10*3/uL (ref 0.0–0.5)
Eosinophils Relative: 0 %
HCT: 41.6 % (ref 39.0–52.0)
Hemoglobin: 14.1 g/dL (ref 13.0–17.0)
Immature Granulocytes: 1 %
Lymphocytes Relative: 15 %
Lymphs Abs: 1.6 10*3/uL (ref 0.7–4.0)
MCH: 31.1 pg (ref 26.0–34.0)
MCHC: 33.9 g/dL (ref 30.0–36.0)
MCV: 91.6 fL (ref 80.0–100.0)
Monocytes Absolute: 0.9 10*3/uL (ref 0.1–1.0)
Monocytes Relative: 9 %
Neutro Abs: 8.1 10*3/uL — ABNORMAL HIGH (ref 1.7–7.7)
Neutrophils Relative %: 74 %
Platelets: 238 10*3/uL (ref 150–400)
RBC: 4.54 MIL/uL (ref 4.22–5.81)
RDW: 13.7 % (ref 11.5–15.5)
WBC: 10.7 10*3/uL — ABNORMAL HIGH (ref 4.0–10.5)
nRBC: 0 % (ref 0.0–0.2)

## 2020-11-06 LAB — GLUCOSE, CAPILLARY
Glucose-Capillary: 158 mg/dL — ABNORMAL HIGH (ref 70–99)
Glucose-Capillary: 172 mg/dL — ABNORMAL HIGH (ref 70–99)

## 2020-11-06 MED ORDER — METOPROLOL SUCCINATE ER 25 MG PO TB24
12.5000 mg | ORAL_TABLET | Freq: Every day | ORAL | 3 refills | Status: DC
  Filled 2020-11-06: qty 45, 90d supply, fill #0

## 2020-11-06 MED ORDER — ALFUZOSIN HCL ER 10 MG PO TB24
10.0000 mg | ORAL_TABLET | Freq: Every day | ORAL | 3 refills | Status: DC
  Filled 2020-11-06: qty 30, 30d supply, fill #0

## 2020-11-06 MED ORDER — SPIRONOLACTONE 25 MG PO TABS
12.5000 mg | ORAL_TABLET | Freq: Every day | ORAL | 3 refills | Status: DC
  Filled 2020-11-06: qty 45, 90d supply, fill #0

## 2020-11-06 NOTE — Progress Notes (Signed)
Progress note ` Foley irrigated clear, plan for d/c home with foley, follow up to be arranged for Tuesday next week for foley removal

## 2020-11-06 NOTE — Progress Notes (Signed)
CARDIAC REHAB PHASE I   Stent education completed with pt. Pt educated on importance of ASA and Plavix. Pt given hear healthy and diabetic diets. Reviewed site care, restrictions, and exercise guidelines. Stressed importance of safety as pt states he has had falls at home. Will refer to CRP II [place].  [phone] Rufina Falco, RN BSN 11/06/2020 1:41 PM

## 2020-11-06 NOTE — Progress Notes (Signed)
Heart Failure Navigator Progress Note  Assessed for Heart & Vascular TOC clinic readiness.  Pt was admitted for elective cardiac cath. Issues regarding hematuria-urology following, pt will go home with foley catheter. Pt has cardiology follow up appt with Dr. [name] 4/14.   EF 25-30%  Navigator available for reassessment of patient.   Pricilla Holm, RN, BSN Heart Failure Nurse Navigator [phone]

## 2020-11-06 NOTE — Consult Note (Signed)
Urology Consult   Physician requesting consult: Johney Frame  Reason for consult: Urinary retention and gross hematuria  History of Present Illness: John Gamble is a 87 y.o. white male who underwent recent cardiac catheterization procedure is required anticoagulation.  Had original procedure possibly 1 week ago and came back for second stage procedure 2 days ago.  Following most recent procedure patient unable to void requiring I&O catheterization with 700 cc residual.  Subsequent developed gross hematuria with some passage of clots at the time of his catheterization.  We are consulted for the gross hematuria and urinary retention.  The patient has been initiated on alfuzosin 10 mg daily as treatment for BPH related urinary retention.  Patient has indwelling three-way Foley catheter placed at my direction overnight and urine this morning is clear on minimal CBI drip.  There is no clots in the tubing.  Patient states that prior to his cardiac procedure he was having some hesitancy and slow stream but was not on any GU medications and has never seen a urologist.  He denies a history of voiding or storage urinary symptoms, hematuria, UTIs, STDs, urolithiasis, GU malignancy/trauma/surgery.  Past Medical History:  Diagnosis Date  . Coronary artery disease   . Hyperlipidemia   . Hypertension   . MI, old   . OA (osteoarthritis)   . Pneumonia    hx of 2013     Past Surgical History:  Procedure Laterality Date  . CARDIAC CATHETERIZATION  01/26/1999   EF 55%  . CARDIAC CATHETERIZATION N/A 05/06/2015   Procedure: Left Heart Cath and Coronary Angiography;  Surgeon: Peter M [name], MD;  Location: [facility];  Service: Cardiovascular;  Laterality: N/A;  . CARDIOVASCULAR STRESS TEST  02/22/2010   EF 61%  . corneal implant     left  . CORONARY ATHERECTOMY N/A 11/04/2020   Procedure: CORONARY ATHERECTOMY;  Surgeon: [name], Peter M, MD;  Location: [facility];  Service: Cardiovascular;   Laterality: N/A;  . CORONARY STENT INTERVENTION N/A 11/04/2020   Procedure: CORONARY STENT INTERVENTION;  Surgeon: [name], Peter M, MD;  Location: [facility];  Service: Cardiovascular;  Laterality: N/A;  . CORONARY STENT PLACEMENT  2000   LEFT CIRCUMFLEX CORONARY  . INGUINAL HERNIA REPAIR    . INTRAVASCULAR ULTRASOUND/IVUS N/A 11/04/2020   Procedure: Intravascular Ultrasound/IVUS;  Surgeon: [name], Peter M, MD;  Location: [facility];  Service: Cardiovascular;  Laterality: N/A;  . REPLACEMENT TOTAL KNEE    . RIGHT/LEFT HEART CATH AND CORONARY ANGIOGRAPHY N/A 10/27/2020   Procedure: RIGHT/LEFT HEART CATH AND CORONARY ANGIOGRAPHY;  Surgeon: [name], Peter M, MD;  Location: [facility];  Service: Cardiovascular;  Laterality: N/A;  . TOTAL KNEE ARTHROPLASTY Left 09/19/2014   Procedure: LEFT TOTAL KNEE ARTHROPLASTY;  Surgeon: Sydnee Cabal, MD;  Location: [facility];  Service: Orthopedics;  Laterality: Left;    Current Hospital Medications:  Home Meds:  No current facility-administered medications on file prior to encounter.   Current Outpatient Medications on File Prior to Encounter  Medication Sig Dispense Refill  . acyclovir (ZOVIRAX) 800 MG tablet Take 800 mg by mouth daily.     Marland Kitchen aspirin EC 81 MG tablet Take 81 mg by mouth daily before supper.    . clopidogrel (PLAVIX) 75 MG tablet Take 1 tablet (75 mg total) by mouth daily. 90 tablet 3  . Multiple Vitamin (MULTIVITAMIN WITH MINERALS) TABS tablet Take 1 tablet by mouth daily. Centrum Silver    . Omega-3  Fatty Acids (FISH OIL) 1200 MG CAPS Take 1,200 mg by mouth daily.    . sacubitril-valsartan (ENTRESTO) 49-51 MG Take 1 tablet by mouth 2 (two) times daily. 180 tablet 3  . amLODipine (NORVASC) 5 MG tablet TAKE ONE (1) TABLET ONCE DAILY (Patient taking differently: Take 5 mg by mouth daily.) 90 tablet 2  . dapagliflozin propanediol (FARXIGA) 10 MG TABS tablet Take 1 tablet (10 mg total) by mouth daily before breakfast. 90  tablet 3  . dorzolamide (TRUSOPT) 2 % ophthalmic solution Place 1 drop into the right eye at bedtime.    . Glucosamine-Chondroitin 250-200 MG TABS Take 2 tablets by mouth 2 (two) times daily.    Marland Kitchen glucose blood (ONETOUCH ULTRA) test strip USE 1 STRIP DAILY.    Marland Kitchen Lancets (ONETOUCH DELICA PLUS [id number]) MISC SMARTSIG:1 Unit(s) Topical Daily    . ONETOUCH ULTRA test strip daily.    . polyethylene glycol (MIRALAX / GLYCOLAX) packet Take 17 g by mouth daily. Mix with 8 oz liquid and drink    . prednisoLONE acetate (PRED FORTE) 1 % ophthalmic suspension Place 1 drop into the left eye daily.    . rosuvastatin (CRESTOR) 20 MG tablet Take 20 mg by mouth daily.    . Zinc 50 MG CAPS Take 50 mg by mouth daily with supper.        Scheduled Meds: . acyclovir  800 mg Oral Daily  . alfuzosin  10 mg Oral Q breakfast  . aspirin EC  81 mg Oral QAC supper  . Chlorhexidine Gluconate Cloth  6 each Topical Daily  . clopidogrel  75 mg Oral Daily  . dorzolamide  1 drop Right Eye QHS  . finasteride  5 mg Oral Daily  . metoprolol succinate  12.5 mg Oral Daily  . multivitamin with minerals  1 tablet Oral Daily  . omega-3 acid ethyl esters  1 g Oral Daily  . prednisoLONE acetate  1 drop Left Eye Daily  . rosuvastatin  20 mg Oral Daily  . sacubitril-valsartan  1 tablet Oral BID  . sodium chloride flush  3 mL Intravenous Q12H  . sodium chloride flush  3 mL Intravenous Q12H  . spironolactone  12.5 mg Oral Daily   Continuous Infusions: . sodium chloride    . cefTRIAXone (ROCEPHIN)  IV 1 g (11/05/20 1247)  . sodium chloride irrigation     PRN Meds:.sodium chloride, acetaminophen, ondansetron (ZOFRAN) IV, polyethylene glycol, sodium chloride flush  Allergies:  Allergies  Allergen Reactions  . Atorvastatin Other (See Comments)    myalgias  . Oysters [Shellfish Allergy] Nausea Only  . Sulfa Antibiotics Nausea And Vomiting    Family History  Problem Relation Age of Onset  . Cancer Mother   . Cancer  Father     Social History:  reports that he quit smoking about 31 years ago. He has a 25.00 pack-year smoking history. He has never used smokeless tobacco. He reports that he does not drink alcohol and does not use drugs.  ROS: A complete review of systems was performed.  All systems are negative except for pertinent findings as noted.  Physical Exam:  Vital signs in last 24 hours: Temp:  [97.7 F (36.5 C)-98.1 F (36.7 C)] 97.9 F (36.6 C) (04/08 0807) Pulse Rate:  [89-96] 89 (04/08 0807) Resp:  [18-19] 18 (04/08 0807) BP: (95-125)/(55-72) 103/71 (04/08 0807) SpO2:  [94 %-99 %] 94 % (04/08 0807) Constitutional:  Alert and oriented, No acute distress Neurologic: Grossly intact, no focal  deficits Psychiatric: Normal mood and affect  Laboratory Data:  Recent Labs    11/05/20 0243  WBC 12.4*  HGB 15.4  HCT 44.8  PLT 289    Recent Labs    11/05/20 0243  NA 135  K 3.4*  CL 105  GLUCOSE 256*  BUN 15  CALCIUM 8.9  CREATININE 0.93     Results for orders placed or performed during the hospital encounter of 11/04/20 (from the past 24 hour(s))  Urinalysis, Routine w reflex microscopic Urine, Catheterized     Status: Abnormal   Collection Time: 11/05/20 10:16 AM  Result Value Ref Range   Color, Urine YELLOW YELLOW   APPearance CLEAR CLEAR   Specific Gravity, Urine 1.015 1.005 - 1.030   pH 5.0 5.0 - 8.0   Glucose, UA >=500 (A) NEGATIVE mg/dL   Hgb urine dipstick LARGE (A) NEGATIVE   Bilirubin Urine NEGATIVE NEGATIVE   Ketones, ur 20 (A) NEGATIVE mg/dL   Protein, ur 30 (A) NEGATIVE mg/dL   Nitrite NEGATIVE NEGATIVE   Leukocytes,Ua NEGATIVE NEGATIVE   RBC / HPF >50 (H) 0 - 5 RBC/hpf   WBC, UA 0-5 0 - 5 WBC/hpf   Bacteria, UA NONE SEEN NONE SEEN  Lactic acid, plasma     Status: None   Collection Time: 11/05/20 11:02 AM  Result Value Ref Range   Lactic Acid, Venous 1.7 0.5 - 1.9 mmol/L  Glucose, capillary     Status: Abnormal   Collection Time: 11/06/20  8:04 AM   Result Value Ref Range   Glucose-Capillary 158 (H) 70 - 99 mg/dL   Recent Results (from the past 240 hour(s))  Urine culture     Status: Abnormal   Collection Time: 10/29/20  9:31 AM   Specimen: Urine, Clean Catch  Result Value Ref Range Status   Specimen Description URINE, CLEAN CATCH  Final   Special Requests   Final    NONE Performed at [facility], [address], [place], [place] [address]    Culture MULTIPLE SPECIES PRESENT, SUGGEST RECOLLECTION (A)  Final   Report Status 10/31/2020 FINAL  Final  SARS CORONAVIRUS 2 (TAT 6-24 HRS) Nasopharyngeal Nasopharyngeal Swab     Status: None   Collection Time: 11/03/20 10:00 AM   Specimen: Nasopharyngeal Swab  Result Value Ref Range Status   SARS Coronavirus 2 NEGATIVE NEGATIVE Final    Comment: (NOTE) SARS-CoV-2 target nucleic acids are NOT DETECTED.  The SARS-CoV-2 RNA is generally detectable in upper and lower respiratory specimens during the acute phase of infection. Negative results do not preclude SARS-CoV-2 infection, do not rule out co-infections with other pathogens, and should not be used as the sole basis for treatment or other patient management decisions. Negative results must be combined with clinical observations, patient history, and epidemiological information. The expected result is Negative.  Fact Sheet for Patients: SugarRoll.be  Fact Sheet for Healthcare Providers: https://www.woods-mathews.com/  This test is not yet approved or cleared by the [place] FDA and  has been authorized for detection and/or diagnosis of SARS-CoV-2 by FDA under an Emergency Use Authorization (EUA). This EUA will remain  in effect (meaning this test can be used) for the duration of the COVID-19 declaration under Se ction 564(b)(1) of the Act, 21 U.S.C. section 360bbb-3(b)(1), unless the authorization is terminated or revoked sooner.  Performed at [facility], [place] [address] [place], [place] [address]     Renal Function: Recent Labs    11/05/20 [id number]  CREATININE 0.93   Estimated Creatinine Clearance: 63.2 mL/min (by C-G formula based on SCr of 0.93 mg/dL).  Radiologic Imaging: CARDIAC CATHETERIZATION  Result Date: 11/04/2020  Ost LAD to Prox LAD lesion is 95% stenosed.  A drug-eluting stent was successfully placed using a SYNERGY XD [id number].  Post intervention, there is a 0% residual stenosis.  1. Successful PCI of the ostial LAD with IVUS guidance, orbital atherectomy and DES x 1 Plan: DAPT for at least 6 months. Will observe overnight and anticipate DC in am.   ECHOCARDIOGRAM COMPLETE  Result Date: 11/05/2020    ECHOCARDIOGRAM REPORT   Patient Name:   John Gamble Date of Exam: 11/05/2020 Medical Rec #:  [id number]    Height:       73.0 in Accession #:    [id number]   Weight:       177.4 lb Date of Birth:  06-22-1931    BSA:          2.045 m Patient Age:    32 years     BP:           145/117 mmHg Patient Gender: M            HR:           110 bpm. Exam Location:  Inpatient Procedure: 2D Echo, Cardiac Doppler and Color Doppler Indications:    Congestive heart failure  History:        Patient has no prior history of Echocardiogram examinations.                 CHF, CAD; Risk Factors:Hypertension, Dyslipidemia and Diabetes.                 S/p PCI. Ischemic cardiomyopathy.  Sonographer:    Clayton Lefort RDCS (AE) Referring Phys: [id number] [place]  1. Diffuse hypokinesis worse in inferior base with abnormal septal motion . Left ventricular ejection fraction, by estimation, is 25 to 30%. The left ventricle has severely decreased function. The left ventricle demonstrates global hypokinesis. The left  ventricular internal cavity size was moderately dilated. There is mild left ventricular hypertrophy. Left ventricular diastolic parameters are indeterminate.  2. Right ventricular systolic function is normal. The right ventricular size is normal. There is  normal pulmonary artery systolic pressure.  3. The mitral valve is normal in structure. Mild mitral valve regurgitation. No evidence of mitral stenosis.  4. The aortic valve is tricuspid. Aortic valve regurgitation is trivial. No aortic stenosis is present.  5. The inferior vena cava is dilated in size with >50% respiratory variability, suggesting right atrial pressure of 8 mmHg. FINDINGS  Left Ventricle: Diffuse hypokinesis worse in inferior base with abnormal septal motion. Left ventricular ejection fraction, by estimation, is 25 to 30%. The left ventricle has severely decreased function. The left ventricle demonstrates global hypokinesis. 3D left ventricular ejection fraction analysis performed but not reported based on interpreter judgement due to suboptimal quality. The left ventricular internal cavity size was moderately dilated. There is mild left ventricular hypertrophy.  Left ventricular diastolic parameters are indeterminate. Right Ventricle: The right ventricular size is normal. No increase in right ventricular wall thickness. Right ventricular systolic function is normal. There is normal pulmonary artery systolic pressure. The tricuspid regurgitant velocity is 2.27 m/s, and  with an assumed right atrial pressure of 15 mmHg, the estimated right ventricular systolic pressure is 84.1 mmHg. Left Atrium: Left atrial size was normal in size. Right Atrium: Right atrial size was normal  in size. Pericardium: There is no evidence of pericardial effusion. Mitral Valve: The mitral valve is normal in structure. There is mild thickening of the mitral valve leaflet(s). Mild mitral valve regurgitation. No evidence of mitral valve stenosis. Tricuspid Valve: The tricuspid valve is normal in structure. Tricuspid valve regurgitation is mild . No evidence of tricuspid stenosis. Aortic Valve: The aortic valve is tricuspid. Aortic valve regurgitation is trivial. No aortic stenosis is present. Aortic valve mean gradient  measures 3.0 mmHg. Aortic valve peak gradient measures 5.7 mmHg. Aortic valve area, by VTI measures 3.61 cm. Pulmonic Valve: The pulmonic valve was normal in structure. Pulmonic valve regurgitation is not visualized. No evidence of pulmonic stenosis. Aorta: The aortic root is normal in size and structure. Venous: The inferior vena cava is dilated in size with greater than 50% respiratory variability, suggesting right atrial pressure of 8 mmHg. IAS/Shunts: No atrial level shunt detected by color flow Doppler.  LEFT VENTRICLE PLAX 2D LVIDd:         5.40 cm LVIDs:         4.50 cm LV PW:         1.60 cm LV IVS:        1.40 cm LVOT diam:     2.20 cm LV SV:         71 LV SV Index:   35 LVOT Area:     3.80 cm  LV Volumes (MOD) LV vol d, MOD A2C: 134.0 ml LV vol d, MOD A4C: 165.0 ml LV vol s, MOD A2C: 93.3 ml LV vol s, MOD A4C: 99.0 ml LV SV MOD A2C:     40.7 ml LV SV MOD A4C:     165.0 ml LV SV MOD BP:      52.4 ml RIGHT VENTRICLE             IVC RV Basal diam:  3.20 cm     IVC diam: 2.00 cm RV S prime:     12.50 cm/s TAPSE (M-mode): 1.2 cm LEFT ATRIUM             Index       RIGHT ATRIUM           Index LA diam:        4.00 cm 1.96 cm/m  RA Area:     12.90 cm LA Vol (A2C):   82.2 ml 40.20 ml/m RA Volume:   23.80 ml  11.64 ml/m LA Vol (A4C):   53.1 ml 25.97 ml/m LA Biplane Vol: 66.9 ml 32.72 ml/m  AORTIC VALVE AV Area (Vmax):    2.90 cm AV Area (Vmean):   3.04 cm AV Area (VTI):     3.61 cm AV Vmax:           119.00 cm/s AV Vmean:          85.800 cm/s AV VTI:            0.198 m AV Peak Grad:      5.7 mmHg AV Mean Grad:      3.0 mmHg LVOT Vmax:         90.70 cm/s LVOT Vmean:        68.700 cm/s LVOT VTI:          0.188 m LVOT/AV VTI ratio: 0.95  AORTA Ao Root diam: 4.10 cm Ao Asc diam:  3.90 cm TRICUSPID VALVE TR Peak grad:   20.6 mmHg TR Vmax:        227.00 cm/s  SHUNTS Systemic VTI:  0.19 m Systemic Diam: 2.20 cm Jenkins Rouge MD Electronically signed by Jenkins Rouge MD Signature Date/Time: 11/05/2020/3:13:33 PM     Final     I independently reviewed the above imaging studies.  Impression/Recommendation 1.  Recent urinary retention likely procedure related with BPH.  Recommend continuing alfuzosin 10 mg daily 2.  Gross hematuria likely secondary to traumatic catheterization as patient states he was not having hematuria prior to the in and out cath.  Hematuria has resolved with placement of three-way Foley and light CBI irrigation suggesting this is a prostatic source.  Would recommend continuing Foley catheter and if patient ready for discharge home could be discharged home with Foley with plans for outpatient follow-up in our office early next week for Foley removal and voiding trial.  Continue the alfuzosin 10 mg daily in the interim.  Please let me know when patient to be discharged home so we can arrange for the outpatient follow-up   Remi Haggard 11/06/2020, 8:31 AM       CC:

## 2020-11-06 NOTE — Progress Notes (Signed)
Heart Failure Stewardship Pharmacist Progress Note   PCP: Algis Greenhouse, MD PCP-Cardiologist: Peter [name], MD    HPI:  87 yo M with PMH of CAD, CHF, HLD, HTN, and OSA. He presented to [facility] on 11/04/20 for an elective PCI to LAD. An ECHO was done on 11/05/20 and LVEF was 25-30% (LVEF 22% 10/21/20 on stress test; LVEF 50% 04/2015 on stress test). He then had urinary retention hematuria and urology was consulted. Plan to discharge with foley and follow up in urology clinic next week for removal and voiding trial.   Discharge HF Medications: Metoprolol XL 12.5 mg daily Entresto 49/51 mg BID Spironolactone 12.5 mg daily  Prior to admission HF Medications: Entresto 49/51 mg BID Farxiga 10 mg daily  Pertinent Lab Values: . Serum creatinine 1.20, BUN 23, Potassium 4.3, Sodium 139, Magnesium 2.1   Vital Signs: . Weight: 177 lbs (admission weight: 178 lbs) . Blood pressure: 110-120/70s  . Heart rate: 90s    Assessment/Plan: 1. Chronic systolic CHF (EF 33-54%), due to ICM. NYHA class II symptoms. - Continue metoprolol XL 12.5 mg daily - Continue Entresto 49/51 mg BID - Continue spironolactone 12.5 mg daily - Holding Leota with high risk for UTI given urinary complications  Kerby Nora, PharmD, BCPS Heart Failure Stewardship Pharmacist Phone [phone]

## 2020-11-06 NOTE — Discharge Instructions (Signed)
PLEASE REMEMBER TO BRING ALL OF YOUR MEDICATIONS TO EACH OF YOUR FOLLOW-UP OFFICE VISITS.  PLEASE ATTEND ALL SCHEDULED FOLLOW-UP APPOINTMENTS.   Activity: Increase activity slowly as tolerated. You may shower, but no soaking baths (or swimming) for 1 week. No driving for 24 hours. No lifting over 5 lbs for 1 week. No sexual activity for 1 week.   You May Return to Work: in 1 week (if applicable)  Wound Care: You may wash cath site gently with soap and water. Keep cath site clean and dry. If you notice pain, swelling, bleeding or pus at your cath site, please call [phone].  MEDICATION INSTRUCTIONS: - Metoprolol succinate (Toprol-XL) - take 12.5mg  daily. This medication is important for patient with heart failure to slow your heart rate down. - Spironolactone (Aldactone) - take 12.5mg  daily. This is an important heart failure medication to improve your overall mortality - Alfuzosin - take 10mg  daily. This medication helps to improve urine flow  - Please STOP your farxiga - this medication may have contributed to your UTI. We can consider restarting this medication outpatient if no further urinary issues. Please discuss alternative diabetes treatment options with your PCP - Please continue your aspirin and plavix as prescribed. Avoid missing doses of this medication which would put you at risk for developing a blockage in your stent and potentially having a heart attack - Please continue your antibiotics (keflex, otherwise known as cephazolin) as prescribed. Your last day of antibiotics is 11/08/20.

## 2020-11-06 NOTE — Progress Notes (Signed)
Cardiology Office Note    Date:  11/12/2020   ID:  John Gamble, DOB 01-05-1931, MRN [id number]  PCP:  Algis Greenhouse, MD  Cardiologist:  Aila Terra [name], MD   Chief Complaint  Patient presents with  . Coronary Artery Disease      History of Present Illness: John Gamble is a 87 y.o. male who presents for follow up CAD. He has a history of coronary disease and is status post stenting of the left circumflex coronary in 2000. He had a normal stress Myoview in July of 2011. He also has a history of hypertension and hyperlipidemia.   In September 2016 he had a nuclear stress test that was abnormal. This led to a cardiac cath.. Stents were patent but he did have ostial RCA (nondominant) and diagonal disease. Since he was asymptomatic medical therapy was recommended.   He did have Covid 19 infection in January 2021. He was admitted to [facility] with hypoxia and bilateral PNA.  He brings a diary of his BP readings and his BP has been excellent. He notes that since his Covid infection his energy is not as good and he has more dyspnea on exertion. Before he was able to ride his exercise bike for 30 minutes straight. Now he has to stop a couple of times. No chest pain. Sometimes has a little left arm pain.   He was evaluated with a Myoview study which was high risk with EF 25% and multiple perfusion abnormalities. This led to a cardiac cath which showed patent stents in the dominant LCx. There was critical ostial LAD disease with heavy calcification. Fortunately right heart and LV filling pressures were ok. Patient returned for complex PCI with atherectomy on 11/04/20. In the interim he did develop hematuria on DAPT. Was seen in the ED and started on Keflex for possible UTI. Patient reported improvement so we did proceed with PCI involving atherectomy and stenting of the ostial LAD with DES. An excellent result was obtained. Unfortunately he developed recurrent urinary complaints and had some obstruction. A  Foley was placed with come clots noted. He was irrigated with clearing urine. Placed on antibiotics with plans to leave foley in place with outpatient Urology follow up. It was felt he had BPH. Wilder Glade was held due to UTI. A1c was 6.6%. He was continued on Entresto and Toprol XL and aldactone were added.   On follow up today he is doing OK. Still has foley in place but urine is clear. Is scheduled to see urology next week. Dr Garlon Hatchet is following sugar. He does note improvement in his SOB. No edema. Tolerating all his new meds well.    Past Medical History:  Diagnosis Date  . Coronary artery disease   . Hyperlipidemia   . Hypertension   . MI, old   . OA (osteoarthritis)   . Pneumonia    hx of 2013     Past Surgical History:  Procedure Laterality Date  . CARDIAC CATHETERIZATION  01/26/1999   EF 55%  . CARDIAC CATHETERIZATION N/A 05/06/2015   Procedure: Left Heart Cath and Coronary Angiography;  Surgeon: Damir Leung M [name], MD;  Location: [facility];  Service: Cardiovascular;  Laterality: N/A;  . CARDIOVASCULAR STRESS TEST  02/22/2010   EF 61%  . corneal implant     left  . CORONARY ATHERECTOMY N/A 11/04/2020   Procedure: CORONARY ATHERECTOMY;  Surgeon: [name], Prisila Dlouhy M, MD;  Location: [place] [facility];  Service: Cardiovascular;  Laterality: N/A;  . CORONARY STENT INTERVENTION N/A 11/04/2020   Procedure: CORONARY STENT INTERVENTION;  Surgeon: [name], Adaria Hole M, MD;  Location: [place] [facility];  Service: Cardiovascular;  Laterality: N/A;  . CORONARY STENT PLACEMENT  2000   LEFT CIRCUMFLEX CORONARY  . INGUINAL HERNIA REPAIR    . INTRAVASCULAR ULTRASOUND/IVUS N/A 11/04/2020   Procedure: Intravascular Ultrasound/IVUS;  Surgeon: [name], Nasri Boakye M, MD;  Location: [place] [facility];  Service: Cardiovascular;  Laterality: N/A;  . REPLACEMENT TOTAL KNEE    . RIGHT/LEFT HEART CATH AND CORONARY ANGIOGRAPHY N/A 10/27/2020   Procedure: RIGHT/LEFT HEART CATH AND CORONARY ANGIOGRAPHY;  Surgeon: [name],  Less Woolsey M, MD;  Location: [place] [facility];  Service: Cardiovascular;  Laterality: N/A;  . TOTAL KNEE ARTHROPLASTY Left 09/19/2014   Procedure: LEFT TOTAL KNEE ARTHROPLASTY;  Surgeon: Sydnee Cabal, MD;  Location: [facility];  Service: Orthopedics;  Laterality: Left;     Current Outpatient Medications  Medication Sig Dispense Refill  . acyclovir (ZOVIRAX) 800 MG tablet Take 800 mg by mouth daily.     Marland Kitchen alfuzosin (UROXATRAL) 10 MG 24 hr tablet Take 1 tablet (10 mg total) by mouth daily with breakfast. 90 tablet 3  . aspirin EC 81 MG tablet Take 81 mg by mouth daily before supper.    . clopidogrel (PLAVIX) 75 MG tablet Take 1 tablet (75 mg total) by mouth daily. 90 tablet 3  . dorzolamide (TRUSOPT) 2 % ophthalmic solution Place 1 drop into the right eye at bedtime.    . Glucosamine-Chondroitin 250-200 MG TABS Take 2 tablets by mouth 2 (two) times daily.    Marland Kitchen glucose blood (ONETOUCH ULTRA) test strip USE 1 STRIP DAILY.    Marland Kitchen Lancets (ONETOUCH DELICA PLUS [id number]) MISC SMARTSIG:1 Unit(s) Topical Daily    . metoprolol succinate (TOPROL-XL) 25 MG 24 hr tablet Take 1/2 tablet (12.5 mg total) by mouth daily. 45 tablet 3  . Multiple Vitamin (MULTIVITAMIN WITH MINERALS) TABS tablet Take 1 tablet by mouth daily. Centrum Silver    . Omega-3 Fatty Acids (FISH OIL) 1200 MG CAPS Take 1,200 mg by mouth daily.    Glory Rosebush ULTRA test strip daily.    . polyethylene glycol (MIRALAX / GLYCOLAX) packet Take 17 g by mouth daily. Mix with 8 oz liquid and drink    . prednisoLONE acetate (PRED FORTE) 1 % ophthalmic suspension Place 1 drop into the left eye daily.    . rosuvastatin (CRESTOR) 20 MG tablet Take 20 mg by mouth daily.    . sacubitril-valsartan (ENTRESTO) 49-51 MG Take 1 tablet by mouth 2 (two) times daily. 180 tablet 3  . spironolactone (ALDACTONE) 25 MG tablet Take 1/2 tablet (12.5 mg total) by mouth daily. 45 tablet 3  . Zinc 50 MG CAPS Take 50 mg by mouth daily with supper.      No current  facility-administered medications for this visit.    Allergies:   Atorvastatin, Oysters [shellfish allergy], and Sulfa antibiotics    Social History:  The patient  reports that he quit smoking about 31 years ago. He has a 25.00 pack-year smoking history. He has never used smokeless tobacco. He reports that he does not drink alcohol and does not use drugs.   Family History:  The patient's family history includes Cancer in his father and mother.    ROS:  Please see the history of present illness.   Otherwise, review of systems are positive for none.   All other systems are reviewed and negative.  PHYSICAL EXAM: VS:  BP (!) 128/52   Pulse 84   Ht 6\' 1"  (1.854 m)   Wt 181 lb 9.6 oz (82.4 kg)   SpO2 98%   BMI 23.96 kg/m  , BMI Body mass index is 23.96 kg/m. GENERAL:  Well appearing WM in NAD HEENT:  PERRL, EOMI, sclera are clear. Oropharynx is clear. NECK:  No jugular venous distention, carotid upstroke brisk and symmetric, no bruits, no thyromegaly or adenopathy LUNGS:  Clear to auscultation bilaterally CHEST:  Unremarkable HEART:  RRR,  PMI not displaced or sustained,S1 and S2 within normal limits, no S3, no S4: no clicks, no rubs, no murmurs ABD:  Soft, nontender. BS +, no masses or bruits. No hepatomegaly, no splenomegaly EXT:  2 + pulses throughout, no edema, no cyanosis no clubbing. Radial site without hematoma.  SKIN:  Warm and dry.  No rashes NEURO:  Alert and oriented x 3. Cranial nerves II through XII intact. PSYCH:  Cognitively intact  EKG:  EKG is  ordered today. NSR with PVCs. Anterior infarct age undetermined.  I have personally reviewed and interpreted this study.    Recent Labs: 10/29/2020: ALT 16 11/05/2020: Magnesium 2.1 11/06/2020: BUN 23; Creatinine, Ser 1.20; Hemoglobin 14.1; Platelets 238; Potassium 4.3; Sodium 139    Lipid Panel    Component Value Date/Time   CHOL 91 11/05/2020 0243   TRIG 80 11/05/2020 0243   HDL 35 (L) 11/05/2020 0243   CHOLHDL 2.6  11/05/2020 0243   VLDL 16 11/05/2020 0243   LDLCALC 40 11/05/2020 0243      Wt Readings from Last 3 Encounters:  11/12/20 181 lb 9.6 oz (82.4 kg)  11/05/20 177 lb 6.4 oz (80.5 kg)  10/27/20 185 lb 9.6 oz (84.2 kg)      Other studies Reviewed: Additional studies/ records that were reviewed today include:  November 2017. Cholesterol 164, triglycerides 300, HDL 32 LDL 83. A1c 6.7%. Dated 12/28/16: glucose 150, A1c 7%. Cholesterol 94, triglyderdes 198, HDL 30, LDL 38. Other chemistries normal.  Dated 07/06/17. Glucose 155, otherwise CMET normal. Cholesterol 109, triglycerides 164, HDL 35, LDL 59.  A1c 6.5%. Dated 07/17/18: cholesterol 108, triglycerides 167, HDL 38, LDL 55. Glucose 142. Otherwise CMET normal. A1c 6.7% Dated 07/22/19: cholesterol 103, triglycerides 187, HDL 36, LDL 53.  Dated 08/17/19: glucose 147. Otherwise CMET normal. Hgb 13.9.  Dated 01/23/20: Glucose 127. Otherwise CMET normal. Dated 07/27/20: glucose 165, otherwise CMET normal. Cholesterol 108, triglycerides 155, HDL 35, LDL 54. A1c 6.6%.    Myoview 10/21/20: Study Highlights   Nuclear stress EF: 22%. The left ventricular ejection fraction is severely decreased (<30%).  Defect 1: There is a defect present in the mid anterior, mid anteroseptal, mid inferolateral, mid anterolateral, apical anterior, apical inferior, apical lateral and apex location.  Findings consistent with prior large anterior apical myocardial infarction.  This is a high risk study. There is no evidence of ischemia . He has had a previous anterior apical MI   Cardiac cath 10/27/20:  RIGHT/LEFT HEART CATH AND CORONARY ANGIOGRAPHY    Conclusion    Ost LAD to Prox LAD lesion is 95% stenosed.  1st Diag lesion is 70% stenosed.  Previously placed Ost Cx to Prox Cx stent (unknown type) is widely patent.  Previously placed LPDA stent (unknown type) is widely patent.  Ost RCA to Prox RCA lesion is 90% stenosed.  There is severe left  ventricular systolic dysfunction.  LV end diastolic pressure is normal.  The left ventricular ejection fraction  is 25-35% by visual estimate.   1. Severe 2 vessel obstructive CAD.    - 90% ostial LAD. Severely calcified. 70% first diagonal    - patent stents in the proximal LCx and left PDA    - 90% ostial nondominant RCA 2. Severe LV dysfunction with regional wall motion abnormalities. 3. Normal LV filling pressures 4. Normal right heart pressures. 5. Preserved cardiac output.   Plan: recommend staged complex  PCI of the ostial LAD with atherectomy and stenting. The diagonal and RCA disease are old and involve small vessels. While CABG would be an option given his advanced age I would favor a percutaneous approach.  PCI 11/04/20: Procedures  CORONARY STENT INTERVENTION  CORONARY ATHERECTOMY  Intravascular Ultrasound/IVUS    Conclusion    Ost LAD to Prox LAD lesion is 95% stenosed.  A drug-eluting stent was successfully placed using a SYNERGY XD [id number].  Post intervention, there is a 0% residual stenosis.   1. Successful PCI of the ostial LAD with IVUS guidance, orbital atherectomy and DES x 1  Plan: DAPT for at least 6 months. Will observe overnight and anticipate DC in am.    Recommendations  Antiplatelet/Anticoag Recommend uninterrupted dual antiplatelet therapy with Aspirin 81mg  daily and Clopidogrel 75mg  daily for a minimum of 6 months (stable ischemic heart disease-Class I recommendation   Coronary Diagrams   Diagnostic Dominance: Left    Intervention     Implants     Echo 11/05/20: IMPRESSIONS    1. Diffuse hypokinesis worse in inferior base with abnormal septal motion  . Left ventricular ejection fraction, by estimation, is 25 to 30%. The  left ventricle has severely decreased function. The left ventricle  demonstrates global hypokinesis. The left  ventricular internal cavity size was moderately dilated. There is mild  left ventricular  hypertrophy. Left ventricular diastolic parameters are  indeterminate.  2. Right ventricular systolic function is normal. The right ventricular  size is normal. There is normal pulmonary artery systolic pressure.  3. The mitral valve is normal in structure. Mild mitral valve  regurgitation. No evidence of mitral stenosis.  4. The aortic valve is tricuspid. Aortic valve regurgitation is trivial.  No aortic stenosis is present.  5. The inferior vena cava is dilated in size with >50% respiratory  variability, suggesting right atrial pressure of 8 mmHg.     ASSESSMENT AND PLAN:  1. Coronary disease status post remote stenting of the left circumflex in 2000 with a bare-metal stent. Cath  in 2016 showed  Patent stents in LCx. New disease in diagonal and ostial nondominant RCA  that correlate with stress test results. He has been  experiencing some DOE since his Covid PNA. High risk Myoview study. EF down to 25-30%. Cardiac cath demonstrated critical ostial LAD stenosis. Now s/p successful PCI with atherectomy and DES. On DAPT for at least a year. Will try and optimize CHF therapy. Plan to repeat Echo in 3 months. Refer to Cardiac Rehab. He is clear to drive.   2. Acute on chronic systolic CHF secondary to ischemic CM. S/p revascularization as noted. Will increase Toprol XL to 25 mg daily. Increase Aldactone to 25 mg daily. Continue Entresto 49/51 mg bid. Once urinary issues resolved consider resuming [place]. Will check BMET today. Follow up in 4 weeks for further medication titration.   3. Hypertension, well controlled.   4. Hyperlipidemia- cholesterol well controlled on Crestor.   5. Diabetes mellitus.  Reviewed recommendations for low carb diet and weight loss. Per Dr.  Dough.  6. UTI with obstructive uropathy and hematuria. Urine has cleared. Follow up with Urology.    Disposition:   FU with me in 4 weeks.   Signed, Timmie Calix [name], MD  11/12/2020 4:21 PM    [facility]  [facility], [place], [place]  [address] Phone: [phone]; Fax: [phone]

## 2020-11-06 NOTE — Progress Notes (Addendum)
Progress Note  Patient Name: John Gamble Date of Encounter: 11/06/2020  [facility] Cardiologist: Peter [name], MD   Subjective   Feels pretty good this morning. Was able to get sleep last night after foley placed - much more comfortable. No complaints of chest pain, SOB, or palpitations. He reports urology was in this morning and continuous irrigation was stopped.   Inpatient Medications    Scheduled Meds: . acyclovir  800 mg Oral Daily  . alfuzosin  10 mg Oral Q breakfast  . aspirin EC  81 mg Oral QAC supper  . Chlorhexidine Gluconate Cloth  6 each Topical Daily  . clopidogrel  75 mg Oral Daily  . dorzolamide  1 drop Right Eye QHS  . finasteride  5 mg Oral Daily  . metoprolol succinate  12.5 mg Oral Daily  . multivitamin with minerals  1 tablet Oral Daily  . omega-3 acid ethyl esters  1 g Oral Daily  . prednisoLONE acetate  1 drop Left Eye Daily  . rosuvastatin  20 mg Oral Daily  . sacubitril-valsartan  1 tablet Oral BID  . sodium chloride flush  3 mL Intravenous Q12H  . sodium chloride flush  3 mL Intravenous Q12H  . spironolactone  12.5 mg Oral Daily   Continuous Infusions: . sodium chloride    . cefTRIAXone (ROCEPHIN)  IV 1 g (11/05/20 1247)  . sodium chloride irrigation     PRN Meds: sodium chloride, acetaminophen, ondansetron (ZOFRAN) IV, polyethylene glycol, sodium chloride flush   Vital Signs    Vitals:   11/05/20 0809 11/05/20 2109 11/05/20 2310 11/06/20 0625  BP: (!) 145/117 (!) 114/55 95/72 125/67  Pulse: (!) 112   96  Resp: 18 18 18 19   Temp: (!) 97.3 F (36.3 C) 97.7 F (36.5 C) 97.7 F (36.5 C) 98.1 F (36.7 C)  TempSrc: Oral Oral Oral Oral  SpO2: 96% 99% 98% 98%  Weight:      Height:        Intake/Output Summary (Last 24 hours) at 11/06/2020 0740 Last data filed at 11/06/2020 0345 Gross per 24 hour  Intake 12127.56 ml  Output 15505 ml  Net -3377.44 ml   Last 3 Weights 11/06/2020 11/05/2020 11/04/2020  Weight (lbs) (No Data) 177 lb 6.4 oz  178 lb 4.8 oz  Weight (kg) (No Data) 80.468 kg 80.876 kg      Telemetry    Sinus rhythm/sinus tachycardia with ongoing episodes of ventricular bigeminy and couples/isolated PVCs, no significant episodes of NSVT - Personally Reviewed  ECG    No new tracings - Personally Reviewed  Physical Exam   GEN: No acute distress.   Neck: No JVD Cardiac: RRR, no murmurs, rubs, or gallops.  Respiratory: Clear to auscultation bilaterally. GI: Soft, nontender, non-distended  GU: foley catheter in place with blood tinged urine in collection container MS: No edema; No deformity. Neuro:  Nonfocal  Psych: Normal affect   Labs    High Sensitivity Troponin:  No results for input(s): TROPONINIHS in the last 720 hours.    Chemistry Recent Labs  Lab 11/05/20 0243  NA 135  K 3.4*  CL 105  CO2 20*  GLUCOSE 256*  BUN 15  CREATININE 0.93  CALCIUM 8.9  GFRNONAA >60  ANIONGAP 10     Hematology Recent Labs  Lab 11/05/20 0243  WBC 12.4*  RBC 4.97  HGB 15.4  HCT 44.8  MCV 90.1  MCH 31.0  MCHC 34.4  RDW 13.2  PLT 289  BNPNo results for input(s): BNP, PROBNP in the last 168 hours.   DDimer No results for input(s): DDIMER in the last 168 hours.   Radiology    CARDIAC CATHETERIZATION  Result Date: 11/04/2020  Ost LAD to Prox LAD lesion is 95% stenosed.  A drug-eluting stent was successfully placed using a SYNERGY XD 3.50X16.  Post intervention, there is a 0% residual stenosis.  1. Successful PCI of the ostial LAD with IVUS guidance, orbital atherectomy and DES x 1 Plan: DAPT for at least 6 months. Will observe overnight and anticipate DC in am.   ECHOCARDIOGRAM COMPLETE  Result Date: 11/05/2020    ECHOCARDIOGRAM REPORT   Patient Name:   John Gamble Date of Exam: 11/05/2020 Medical Rec #:  [id number]    Height:       73.0 in Accession #:    [id number]   Weight:       177.4 lb Date of Birth:  [birth year]    BSA:          2.045 m Patient Age:    68 years     BP:           145/117 mmHg  Patient Gender: M            HR:           110 bpm. Exam Location:  Inpatient Procedure: 2D Echo, Cardiac Doppler and Color Doppler Indications:    Congestive heart failure  History:        Patient has no prior history of Echocardiogram examinations.                 CHF, CAD; Risk Factors:Hypertension, Dyslipidemia and Diabetes.                 S/p PCI. Ischemic cardiomyopathy.  Sonographer:    Clayton Lefort RDCS (AE) Referring Phys: [id number] [place]  1. Diffuse hypokinesis worse in inferior base with abnormal septal motion . Left ventricular ejection fraction, by estimation, is 25 to 30%. The left ventricle has severely decreased function. The left ventricle demonstrates global hypokinesis. The left  ventricular internal cavity size was moderately dilated. There is mild left ventricular hypertrophy. Left ventricular diastolic parameters are indeterminate.  2. Right ventricular systolic function is normal. The right ventricular size is normal. There is normal pulmonary artery systolic pressure.  3. The mitral valve is normal in structure. Mild mitral valve regurgitation. No evidence of mitral stenosis.  4. The aortic valve is tricuspid. Aortic valve regurgitation is trivial. No aortic stenosis is present.  5. The inferior vena cava is dilated in size with >50% respiratory variability, suggesting right atrial pressure of 8 mmHg. FINDINGS  Left Ventricle: Diffuse hypokinesis worse in inferior base with abnormal septal motion. Left ventricular ejection fraction, by estimation, is 25 to 30%. The left ventricle has severely decreased function. The left ventricle demonstrates global hypokinesis. 3D left ventricular ejection fraction analysis performed but not reported based on interpreter judgement due to suboptimal quality. The left ventricular internal cavity size was moderately dilated. There is mild left ventricular hypertrophy.  Left ventricular diastolic parameters are indeterminate. Right  Ventricle: The right ventricular size is normal. No increase in right ventricular wall thickness. Right ventricular systolic function is normal. There is normal pulmonary artery systolic pressure. The tricuspid regurgitant velocity is 2.27 m/s, and  with an assumed right atrial pressure of 15 mmHg, the estimated right ventricular systolic pressure is 03.4 mmHg. Left Atrium: Left atrial size was  normal in size. Right Atrium: Right atrial size was normal in size. Pericardium: There is no evidence of pericardial effusion. Mitral Valve: The mitral valve is normal in structure. There is mild thickening of the mitral valve leaflet(s). Mild mitral valve regurgitation. No evidence of mitral valve stenosis. Tricuspid Valve: The tricuspid valve is normal in structure. Tricuspid valve regurgitation is mild . No evidence of tricuspid stenosis. Aortic Valve: The aortic valve is tricuspid. Aortic valve regurgitation is trivial. No aortic stenosis is present. Aortic valve mean gradient measures 3.0 mmHg. Aortic valve peak gradient measures 5.7 mmHg. Aortic valve area, by VTI measures 3.61 cm. Pulmonic Valve: The pulmonic valve was normal in structure. Pulmonic valve regurgitation is not visualized. No evidence of pulmonic stenosis. Aorta: The aortic root is normal in size and structure. Venous: The inferior vena cava is dilated in size with greater than 50% respiratory variability, suggesting right atrial pressure of 8 mmHg. IAS/Shunts: No atrial level shunt detected by color flow Doppler.  LEFT VENTRICLE PLAX 2D LVIDd:         5.40 cm LVIDs:         4.50 cm LV PW:         1.60 cm LV IVS:        1.40 cm LVOT diam:     2.20 cm LV SV:         71 LV SV Index:   35 LVOT Area:     3.80 cm  LV Volumes (MOD) LV vol d, MOD A2C: 134.0 ml LV vol d, MOD A4C: 165.0 ml LV vol s, MOD A2C: 93.3 ml LV vol s, MOD A4C: 99.0 ml LV SV MOD A2C:     40.7 ml LV SV MOD A4C:     165.0 ml LV SV MOD BP:      52.4 ml RIGHT VENTRICLE             IVC RV  Basal diam:  3.20 cm     IVC diam: 2.00 cm RV S prime:     12.50 cm/s TAPSE (M-mode): 1.2 cm LEFT ATRIUM             Index       RIGHT ATRIUM           Index LA diam:        4.00 cm 1.96 cm/m  RA Area:     12.90 cm LA Vol (A2C):   82.2 ml 40.20 ml/m RA Volume:   23.80 ml  11.64 ml/m LA Vol (A4C):   53.1 ml 25.97 ml/m LA Biplane Vol: 66.9 ml 32.72 ml/m  AORTIC VALVE AV Area (Vmax):    2.90 cm AV Area (Vmean):   3.04 cm AV Area (VTI):     3.61 cm AV Vmax:           119.00 cm/s AV Vmean:          85.800 cm/s AV VTI:            0.198 m AV Peak Grad:      5.7 mmHg AV Mean Grad:      3.0 mmHg LVOT Vmax:         90.70 cm/s LVOT Vmean:        68.700 cm/s LVOT VTI:          0.188 m LVOT/AV VTI ratio: 0.95  AORTA Ao Root diam: 4.10 cm Ao Asc diam:  3.90 cm TRICUSPID VALVE TR Peak grad:   20.6 mmHg TR  Vmax:        227.00 cm/s  SHUNTS Systemic VTI:  0.19 m Systemic Diam: 2.20 cm Jenkins Rouge MD Electronically signed by Jenkins Rouge MD Signature Date/Time: 11/05/2020/3:13:33 PM    Final     Cardiac Studies   Coronary stent intervention 11/04/20:  Colon Flattery LAD to Prox LAD lesion is 95% stenosed.  A drug-eluting stent was successfully placed using a SYNERGY XD 3.50X16.  Post intervention, there is a 0% residual stenosis.  1. Successful PCI of the ostial LAD with IVUS guidance, orbital atherectomy and DES x 1  Plan: DAPT for at least 6 months. Will observe overnight and anticipate DC in am  Echocardiogram 11/05/20: 1. Diffuse hypokinesis worse in inferior base with abnormal septal motion  . Left ventricular ejection fraction, by estimation, is 25 to 30%. The  left ventricle has severely decreased function. The left ventricle  demonstrates global hypokinesis. The left  ventricular internal cavity size was moderately dilated. There is mild  left ventricular hypertrophy. Left ventricular diastolic parameters are  indeterminate.  2. Right ventricular systolic function is normal. The right ventricular  size  is normal. There is normal pulmonary artery systolic pressure.  3. The mitral valve is normal in structure. Mild mitral valve  regurgitation. No evidence of mitral stenosis.  4. The aortic valve is tricuspid. Aortic valve regurgitation is trivial.  No aortic stenosis is present.  5. The inferior vena cava is dilated in size with >50% respiratory  variability, suggesting right atrial pressure of 8 mmHg.   Patient Profile     87 y.o. male with a PMH of CAD s/p stenting to LCx in 2000, recently diagnosed chronic combined CHF, HTN, HLD, and OA, who presented for outpatient coronary intervention.   Assessment & Plan    1. CAD s/p PCI to LCx in 2000 and now PCI/DES to LAD 11/04/20:patient presented for planned staged intervention to LAD following recent [facility] 10/27/20 to further evaluate an abnormal NST. He underwent successful orbital atherectomy and DES to ostial LAD. He was recommended to continue aspirin and plavix uninterrupted for a minimum of 6 months. Right radial cath site was without hematoma with dressing C/D/I on the day of discharge. Historically not on a BBlocker due to bradycardia on Toprol-XL, though now with sinus tachycardia with PVCs/PACs. - Continue aspirin and plavix - Continue statin - Continue BBlocker  2. Chronic combined CHF/ICM:recently diagnosed on NST with EF 22%, confirmed on R/LHC to have estimated EF 25-35%. Echo 11/05/20 showed EF 25-30%, moderately dilated LV, mild LVH, indeterminate LV diastolic function, and mild MR. Recently started on entresto and farxiga, though not on BBlocker due to documented history of bradycardia on Toprol-XL. He has had persistent sinus tachycardia with frequent ectopy on tele this admission and was restarted on low dose metoprolol succinate 12.5mg  daily.Additionally spironolactone 12.5mg  daily was started yesterday given EF <40. He has no volume overload complaints and appears euvolemic on exam.  - Continue metoprolol succinate, entresto,  and spironolactone - Farxiga on hold in the setting of recent UTI and ongoing hematuria  3. HTN:BP intermittently elevated this admission. Decision made to start metoprolol succinate 12.5mg  daily given frequent ectopy and recently diagnosed cardiomyopathy, as well as low dose spironolactone given suspected EF <40%.  - Managed in the context of #2. - Will need repeat BMET outpatient for close monitoring of electrolytes and kidney function  4. HLD:LDL 40 this admission; at goal of <70. Prior intolerance to atorvastatin - Continue crestor  5. New diagnosis of  DM type 2: A1C 6.6 this admission. Recently started on farxiga, though now on hold given recent UTI.  - Recommend close PCP follow-up - Can consider re-challenging farxiga outpatient.   6. Recent UTI with ongoing hematuria and urinary retention: patient recently seen in ED for UTI and started on keflex to complete a 10 day course. Reported some urinary hesitancy and mild dysuria overnight after [facility] 11/04/20. Straight cath with 750cc output with several clots observed. UA with large blood, glucose, and protein, though no bacteria seen. UCx pending. Antibiotics were broadened to CTX. Discussed with Urology and 3 way foley placed for continuous irrigation overnight.  - Await urology input - anticipate patient will be discharged home with foley catheter in place with plans to follow-up with urology outpatient in 1-2 weeks for outpatient TOV.    For questions or updates, please contact [place] Please consult [email or web address] for contact info under        Signed, Abigail Butts, PA-C  11/06/2020, 7:40 AM    Patient seen and examined and agree with Roby Lofts, PA-C as detailed above.  In brief, the patient is a 87 y.o.malewith a PMH of CAD s/p stenting to LCx in 2000, recently diagnosed chronic combined CHF, HTN, HLD, and OA, who presented for planned PCI with orbital atherectomy to LAD. Hospital course complicated by urinary  retention s/p foley placement with hematuria. CBI initiated overnight with clear urine this AM. Will ensure no further bleeding this AM and if stable, can discharge home with foley in place and close urology follow-up.  TTE notably with LVEF 25-30% with global hypokinesis consistent with findings on cath, mild MR, RAP 8, normal PASP. Will continue to optimize medications for underlying systolic heart failure.   GEN: No acute distress.   Neck: No JVD Cardiac: RRR, no murmurs, rubs, or gallops. Right radial cath site c/d/i Respiratory: Clear to auscultation bilaterally. GI: Soft, nontender, non-distended  GU: Foley in place with bloody urine in collection container MS: No edema; No deformity.  Neuro:  Nonfocal  Psych: Normal affect   Plan: -S/p successful orbital atherectomy with PCI to LAD -Continue aspirin and plavix -Continue low dose beta blocker and monitor for bradycardia as patient is having frequent ventricular ectopy on the monitor -Continue crestor -Continue entresto and spironolactone 12.5mg  daily -Stopped farxiga given UTI; can consider re-challenging as out-patient  -Appreciate Urology assistance with urinary retention; foley in place and will continue on discharge with planned follow-up with them -Continue afluzosin and finasteride -Resume keflex on discharge (end date 11/08/20) -If urine clear with no evidence of recurrent hematuria, can discharge home today -Will need repeat BMET as out-patient; suspect slight rise in Cr secondary to urinary retention and will improve now that foley in place  Gwyndolyn Kaufman, MD

## 2020-11-06 NOTE — Care Management (Signed)
1436 11-06-20 Case Manager spoke with the patient regarding home health registered nurse for foley care. Patient feels that his wife can assist with the foley care and will not need services at this time. Case Manager did call [facility] and they are aware of the patient. Per liaison if the patient needs assistance the patient can call the office at [phone]. Case Manager will provide the patients wife with the information. No home health arranged at this time. Graves-Bigelow, Ocie Cornfield, RN, BSN Case Manager

## 2020-11-06 NOTE — Progress Notes (Signed)
CARDIAC REHAB PHASE I   Went to offer to walk with pt. Pt still hooked up to CBI, asleep in bed, arousable to voice, but drowsy. Will f/u to ambulate and complete education as appropriate.  [id number] Rufina Falco, RN BSN 11/06/2020 11:09 AM

## 2020-11-12 ENCOUNTER — Ambulatory Visit: Payer: Medicare Other | Admitting: Cardiology

## 2020-11-12 ENCOUNTER — Other Ambulatory Visit: Payer: Self-pay

## 2020-11-12 ENCOUNTER — Encounter: Payer: Self-pay | Admitting: Cardiology

## 2020-11-12 VITALS — BP 128/52 | HR 84 | Ht 73.0 in | Wt 181.6 lb

## 2020-11-12 DIAGNOSIS — I2510 Atherosclerotic heart disease of native coronary artery without angina pectoris: Secondary | ICD-10-CM | POA: Diagnosis not present

## 2020-11-12 DIAGNOSIS — I5023 Acute on chronic systolic (congestive) heart failure: Secondary | ICD-10-CM

## 2020-11-12 DIAGNOSIS — E782 Mixed hyperlipidemia: Secondary | ICD-10-CM

## 2020-11-12 DIAGNOSIS — I10 Essential (primary) hypertension: Secondary | ICD-10-CM | POA: Diagnosis not present

## 2020-11-12 MED ORDER — SPIRONOLACTONE 25 MG PO TABS: 25.0000 mg | ORAL_TABLET | Freq: Every day | ORAL | 3 refills | Status: DC

## 2020-11-12 MED ORDER — METOPROLOL SUCCINATE ER 25 MG PO TB24: 25.0000 mg | ORAL_TABLET | Freq: Every day | ORAL | 3 refills | Status: DC

## 2020-11-12 NOTE — Patient Instructions (Addendum)
Medication Instructions:  INCREASE- Metoprolol Succinate 25 mg by mouth daily INCREASE- Spironolactone 25 mg by mouth daily  *If you need a refill on your cardiac medications before your next appointment, please call your pharmacy*   Lab Work: BMP Today  If you have labs (blood work) drawn today and your tests are completely normal, you will receive your results only by: Marland Kitchen MyChart Message (if you have MyChart) OR . A paper copy in the mail If you have any lab test that is abnormal or we need to change your treatment, we will call you to review the results.   Testing/Procedures: None Ordered   Follow-Up: At [facility], you and your health needs are our priority.  As part of our continuing mission to provide you with exceptional heart care, we have created designated Provider Care Teams.  These Care Teams include your primary Cardiologist (physician) and Advanced Practice Providers (APPs -  Physician Assistants and Nurse Practitioners) who all work together to provide you with the care you need, when you need it.  We recommend signing up for the patient portal called "MyChart".  Sign up information is provided on this After Visit Summary.  MyChart is used to connect with patients for Virtual Visits (Telemedicine).  Patients are able to view lab/test results, encounter notes, upcoming appointments, etc.  Non-urgent messages can be sent to your provider as well.   To learn more about what you can do with MyChart, go to [email or web address].    Your next appointment:   1 month(s)  The format for your next appointment:   In Person  Provider:   You may see Isai Gottlieb [name], MD or one of the following Advanced Practice Providers on your designated Care Team:    Almyra Deforest, PA-C  Fabian Sharp, [place] or   Roby Lofts, [place]    Other Instructions You have been referred to Cardiac Rehab in [place]

## 2020-11-13 LAB — BASIC METABOLIC PANEL
BUN/Creatinine Ratio: 15 (ref 10–24)
BUN: 13 mg/dL (ref 8–27)
CO2: 23 mmol/L (ref 20–29)
Calcium: 9.1 mg/dL (ref 8.6–10.2)
Chloride: 103 mmol/L (ref 96–106)
Creatinine, Ser: 0.85 mg/dL (ref 0.76–1.27)
Glucose: 180 mg/dL — ABNORMAL HIGH (ref 65–99)
Potassium: 4.3 mmol/L (ref 3.5–5.2)
Sodium: 140 mmol/L (ref 134–144)
eGFR: 84 mL/min/{1.73_m2} (ref >59)

## 2020-11-16 ENCOUNTER — Telehealth: Payer: Self-pay

## 2020-11-16 ENCOUNTER — Encounter (HOSPITAL_COMMUNITY): Payer: Self-pay | Admitting: Radiology

## 2020-11-16 NOTE — Progress Notes (Signed)
Patient has been removed from the echo workqueue  10/23/2020 4:52 PM 10/23/2020 4:53 PM 11/12/2020 4:33 PM By: By: By: Koleen Distance   Cancel Rsn: Provider (not needed)

## 2020-11-16 NOTE — Telephone Encounter (Addendum)
Called and left a voice message asking patient to give office a call back for results.  ----- Message from Peter M [name], MD sent at 11/13/2020  8:23 AM EDT ----- The following abnormalities are noted:  sugar is elevated. Renal function and potassium are normal.  All other values are normal, stable or within acceptable limits. Medication changes / Follow up labs / Other changes or recommendations:   None  Peter [name], MD 11/13/2020 8:23 AM

## 2020-11-18 ENCOUNTER — Telehealth (HOSPITAL_COMMUNITY): Payer: Self-pay

## 2020-11-18 NOTE — Telephone Encounter (Signed)
Per phase I, fax cardiac rehab referral to [place] [facility].  

## 2020-11-19 ENCOUNTER — Telehealth: Payer: Self-pay | Admitting: Cardiology

## 2020-11-19 NOTE — Telephone Encounter (Signed)
Pt c/o medication issue:  1. Name of Medication:  Metformin Amlodipine Olmesartan  2. How are you currently taking this medication (dosage and times per day)?  Patient has not been taking these medications  3. Are you having a reaction (difficulty breathing--STAT)?  No   4. What is your medication issue?  Patient's wife states the patient's PCP advised that he go back on these medications. She is requesting Dr. Doug Sou advisement.

## 2020-11-19 NOTE — Telephone Encounter (Signed)
Returned call to patient's wife, made patients wife aware of  Lab results. Patients wife verbalized understanding.

## 2020-11-19 NOTE — Telephone Encounter (Signed)
Returned call to wife (ok per DPR) to discuss  States PCP started patient on metformin, amlodipine, and olmesartan   Patient is on Entresto-confirmed with wife.  Advised shouldn't be on olmesartan + Entresto.    She is unsure why these were started.   Called Dr. Garlon Hatchet office to request OV note faxed to Dr. [name]   Routed to MD to review-awaiting [place] fax

## 2020-11-19 NOTE — Telephone Encounter (Signed)
Patient's wife is returning call to discuss lab results. 

## 2020-11-20 ENCOUNTER — Other Ambulatory Visit (HOSPITAL_COMMUNITY): Payer: Medicare Other

## 2020-11-20 NOTE — Telephone Encounter (Signed)
We had stopped amlodipine and olmesartan to be able to titrate CHF therapy with Toprol/entresto/aldactone. So he should not be on amlodipine.   John Gamble [name] MD, [facility]

## 2020-11-20 NOTE — Telephone Encounter (Signed)
Spoke to patient's wife she stated she wanted to make sure husband is taking correct medication.Stated he went to see PCP this past Wed and was told to restart previous medications.Advised he should not take Olmesartan.Urologist started Finasteride 5 mg daily yesterday.She wanted to know if he needs to restart Amlodipine 5 mg daily.All medications reviewed and medication list is correct.Message sent to [place] for advice.

## 2020-11-20 NOTE — Telephone Encounter (Signed)
Spoke to patient's wife Dr.Jordan advised do not take Amlodipine.

## 2020-11-23 ENCOUNTER — Telehealth: Payer: Self-pay

## 2020-11-23 NOTE — Telephone Encounter (Signed)
Received Cardiac Rehab Referral Form from [facility].Form completed and faxed back to fax # [phone].

## 2020-12-04 ENCOUNTER — Telehealth: Payer: Self-pay | Admitting: Cardiology

## 2020-12-04 NOTE — Telephone Encounter (Signed)
Patient is calling to talk with Dr. Jordan or nurse. Please call back 

## 2020-12-04 NOTE — Telephone Encounter (Signed)
Patients wife made aware of Dr. Doug Sou recommendations.   Advised to call back with any issues, questions, or concerns. Patients wife verbalized understanding.

## 2020-12-04 NOTE — Telephone Encounter (Signed)
Yes he is to continue these medications  Chalsey Leeth [name] MD, [facility]

## 2020-12-04 NOTE — Telephone Encounter (Signed)
Returned call to patient's wife who states that she was calling to confirm whether or not patient should continue taking his Metoprolol Succinate 25mg  daily and his Spironolactone 25mg  daily.   Advised patients wife that per recent OV with Dr. [name] he is to continue these medications. Patients wife would like to verify if Dr. [name]. Advised patients wife I would forward message to Dr. [name] for review.   Patients wife states that patient is not currently having any cardiac issues/symptoms.

## 2020-12-10 ENCOUNTER — Emergency Department (HOSPITAL_COMMUNITY)
Admission: EM | Admit: 2020-12-10 | Discharge: 2020-12-10 | Disposition: A | Discharge status: Home / Self Care | Payer: Medicare Other | Attending: Emergency Medicine | Admitting: Emergency Medicine

## 2020-12-10 ENCOUNTER — Other Ambulatory Visit: Payer: Self-pay

## 2020-12-10 ENCOUNTER — Encounter (HOSPITAL_COMMUNITY): Payer: Self-pay

## 2020-12-10 DIAGNOSIS — R1030 Lower abdominal pain, unspecified: Secondary | ICD-10-CM | POA: Diagnosis not present

## 2020-12-10 DIAGNOSIS — Z7984 Long term (current) use of oral hypoglycemic drugs: Secondary | ICD-10-CM | POA: Insufficient documentation

## 2020-12-10 DIAGNOSIS — Z87891 Personal history of nicotine dependence: Secondary | ICD-10-CM | POA: Diagnosis not present

## 2020-12-10 DIAGNOSIS — Z96652 Presence of left artificial knee joint: Secondary | ICD-10-CM | POA: Diagnosis not present

## 2020-12-10 DIAGNOSIS — I5043 Acute on chronic combined systolic (congestive) and diastolic (congestive) heart failure: Secondary | ICD-10-CM | POA: Insufficient documentation

## 2020-12-10 DIAGNOSIS — Z79899 Other long term (current) drug therapy: Secondary | ICD-10-CM | POA: Diagnosis not present

## 2020-12-10 DIAGNOSIS — Z7982 Long term (current) use of aspirin: Secondary | ICD-10-CM | POA: Insufficient documentation

## 2020-12-10 DIAGNOSIS — I2510 Atherosclerotic heart disease of native coronary artery without angina pectoris: Secondary | ICD-10-CM | POA: Insufficient documentation

## 2020-12-10 DIAGNOSIS — R339 Retention of urine, unspecified: Secondary | ICD-10-CM | POA: Insufficient documentation

## 2020-12-10 DIAGNOSIS — E119 Type 2 diabetes mellitus without complications: Secondary | ICD-10-CM | POA: Diagnosis not present

## 2020-12-10 DIAGNOSIS — Z951 Presence of aortocoronary bypass graft: Secondary | ICD-10-CM | POA: Diagnosis not present

## 2020-12-10 DIAGNOSIS — Z7902 Long term (current) use of antithrombotics/antiplatelets: Secondary | ICD-10-CM | POA: Diagnosis not present

## 2020-12-10 DIAGNOSIS — I110 Hypertensive heart disease with heart failure: Secondary | ICD-10-CM | POA: Diagnosis not present

## 2020-12-10 NOTE — ED Provider Notes (Signed)
[facility]-EMERGENCY DEPT Provider Note   CSN: [id number] Arrival date & time: 12/10/20  1923     History Chief Complaint  Patient presents with  . Urinary Retention    John Gamble is a 87 y.o. male.  HPI He presents for evaluation of nonfunctioning Foley catheter, since about 2 PM today.  He has sensation of severe urgency and swelling in his lower abdomen.  Current Foley catheter, was placed 5 days ago.  Previously had a catheter that was removed 3 days prior to that, after visit with urologist.  He denies seeing blood in the catheter recently.  This is new problem for him, occurring after cardiac stenting a couple weeks ago.  He denies fever, chills, nausea, vomiting, weakness or dizziness.  There are no other known modifying factors.    Past Medical History:  Diagnosis Date  . Coronary artery disease   . Hyperlipidemia   . Hypertension   . MI, old   . OA (osteoarthritis)   . Pneumonia    hx of 2013     Patient Active Problem List   Diagnosis Date Noted  . Urinary retention 11/06/2020  . Hematuria 11/06/2020  . Unstable angina (HCC) 11/05/2020  . Acute on chronic combined systolic and diastolic CHF (congestive heart failure) (HCC) 11/04/2020  . Dyspnea on exertion   . Type 2 diabetes mellitus without complication, without long-term current use of insulin (HCC) 08/21/2017  . Abnormal nuclear stress test 05/06/2015  . Primary osteoarthritis of knee 09/19/2014  . S/P knee replacement 09/19/2014  . S/P total knee arthroplasty 09/19/2014  . Lower gastrointestinal bleed 01/30/2012  . Coronary artery disease   . MI, old   . Hypertension   . Hyperlipidemia     Past Surgical History:  Procedure Laterality Date  . CARDIAC CATHETERIZATION  01/26/1999   EF 55%  . CARDIAC CATHETERIZATION N/A 05/06/2015   Procedure: Left Heart Cath and Coronary Angiography;  Surgeon: Peter M Swaziland, MD;  Location: [facility] INVASIVE CV LAB;  Service: Cardiovascular;   Laterality: N/A;  . CARDIOVASCULAR STRESS TEST  02/22/2010   EF 61%  . corneal implant     left  . CORONARY ATHERECTOMY N/A 11/04/2020   Procedure: CORONARY ATHERECTOMY;  Surgeon: Swaziland, Peter M, MD;  Location: [facility] INVASIVE CV LAB;  Service: Cardiovascular;  Laterality: N/A;  . CORONARY STENT INTERVENTION N/A 11/04/2020   Procedure: CORONARY STENT INTERVENTION;  Surgeon: Swaziland, Peter M, MD;  Location: [facility] INVASIVE CV LAB;  Service: Cardiovascular;  Laterality: N/A;  . CORONARY STENT PLACEMENT  2000   LEFT CIRCUMFLEX CORONARY  . INGUINAL HERNIA REPAIR    . INTRAVASCULAR ULTRASOUND/IVUS N/A 11/04/2020   Procedure: Intravascular Ultrasound/IVUS;  Surgeon: Swaziland, Peter M, MD;  Location: [facility] INVASIVE CV LAB;  Service: Cardiovascular;  Laterality: N/A;  . REPLACEMENT TOTAL KNEE    . RIGHT/LEFT HEART CATH AND CORONARY ANGIOGRAPHY N/A 10/27/2020   Procedure: RIGHT/LEFT HEART CATH AND CORONARY ANGIOGRAPHY;  Surgeon: Swaziland, Peter M, MD;  Location: [facility] INVASIVE CV LAB;  Service: Cardiovascular;  Laterality: N/A;  . TOTAL KNEE ARTHROPLASTY Left 09/19/2014   Procedure: LEFT TOTAL KNEE ARTHROPLASTY;  Surgeon: Eugenia Mcalpine, MD;  Location: [facility]S;  Service: Orthopedics;  Laterality: Left;       Family History  Problem Relation Age of Onset  . Cancer Mother   . Cancer Father     Social History   Tobacco Use  . Smoking status: Former Smoker    Packs/day: 0.50  Years: 50.00    Pack years: 25.00    Quit date: 02/23/1989    Years since quitting: 31.8  . Smokeless tobacco: Never Used  Vaping Use  . Vaping Use: Never used  Substance Use Topics  . Alcohol use: No  . Drug use: No    Home Medications Prior to Admission medications   Medication Sig Start Date End Date Taking? Authorizing Provider  acyclovir (ZOVIRAX) 800 MG tablet Take 800 mg by mouth daily.  03/03/14   [provider]  alfuzosin (UROXATRAL) 10 MG 24 hr tablet Take 1 tablet (10 mg total) by mouth daily with breakfast. 11/07/20    Kroeger, Lorelee Cover., PA-C  aspirin EC 81 MG tablet Take 81 mg by mouth daily before supper.    [provider]  clopidogrel (PLAVIX) 75 MG tablet Take 1 tablet (75 mg total) by mouth daily. 10/27/20 10/27/21  [name], Peter M, MD  dorzolamide (TRUSOPT) 2 % ophthalmic solution Place 1 drop into the right eye at bedtime.    [provider]  finasteride (PROSCAR) 5 MG tablet Take 1 tablet (5 mg total) by mouth daily. 11/20/20   [name], Peter M, MD  Glucosamine-Chondroitin 250-200 MG TABS Take 2 tablets by mouth 2 (two) times daily.    [provider]  glucose blood (ONETOUCH ULTRA) test strip USE 1 STRIP DAILY. 08/20/20   [provider]  Lancets (ONETOUCH DELICA PLUS [id number]) [place] SMARTSIG:1 Unit(s) Topical Daily 07/01/20   [provider]  metFORMIN (GLUCOPHAGE) 500 MG tablet Take by mouth daily with breakfast. 11/20/20   [name], Peter M, MD  metoprolol succinate (TOPROL-XL) 25 MG 24 hr tablet Take 1 tablet (25 mg total) by mouth daily. 11/12/20   [name], Peter M, MD  Multiple Vitamin (MULTIVITAMIN WITH MINERALS) TABS tablet Take 1 tablet by mouth daily. Centrum Silver    [provider]  Omega-3 Fatty Acids (FISH OIL) 1200 MG CAPS Take 1,200 mg by mouth daily.    [provider]  [facility] ULTRA test strip daily. 08/20/20   [provider]  polyethylene glycol (MIRALAX / GLYCOLAX) packet Take 17 g by mouth daily. Mix with 8 oz liquid and drink    [provider]  prednisoLONE acetate (PRED FORTE) 1 % ophthalmic suspension Place 1 drop into the left eye daily. 09/12/20   [provider]  rosuvastatin (CRESTOR) 20 MG tablet Take 20 mg by mouth daily.    [provider]  sacubitril-valsartan (ENTRESTO) 49-51 MG Take 1 tablet by mouth 2 (two) times daily. 10/23/20   [name], Peter M, MD  spironolactone (ALDACTONE) 25 MG tablet Take 1 tablet (25 mg total) by mouth daily. 11/12/20   [name], Peter M, MD  Zinc 50 MG CAPS  Take 50 mg by mouth daily with supper.     [provider]    Allergies    Atorvastatin, Oysters [shellfish allergy], and Sulfa antibiotics  Review of Systems   Review of Systems  All other systems reviewed and are negative.   Physical Exam Updated Vital Signs BP (!) 148/80 (BP Location: Left Arm)   Pulse (!) 116   Temp 97.6 F (36.4 C) (Oral)   Resp 16   Ht 6\' 1"  (1.854 m)   Wt 77 kg   SpO2 99%   BMI 22.40 kg/m   Physical Exam Vitals and nursing note reviewed.  Constitutional:      General: He is not in acute distress.    Appearance: He is well-developed. He  is not ill-appearing, toxic-appearing or diaphoretic.  HENT:     Head: Normocephalic and atraumatic.     Right Ear: External ear normal.     Left Ear: External ear normal.     Nose: No congestion or rhinorrhea.     Mouth/Throat:     Pharynx: No oropharyngeal exudate or posterior oropharyngeal erythema.  Eyes:     Conjunctiva/sclera: Conjunctivae normal.     Pupils: Pupils are equal, round, and reactive to light.  Neck:     Trachea: Phonation normal.  Cardiovascular:     Rate and Rhythm: Normal rate.  Pulmonary:     Effort: Pulmonary effort is normal.  Abdominal:     General: There is distension.     Palpations: Abdomen is soft.     Tenderness: There is abdominal tenderness (Suprapubic, moderate).     Comments: Unable to discern a bladder mass on examination.  Musculoskeletal:        General: Normal range of motion.     Cervical back: Normal range of motion and neck supple.  Skin:    General: Skin is warm and dry.  Neurological:     Mental Status: He is alert and oriented to person, place, and time.     Cranial Nerves: No cranial nerve deficit.     Sensory: No sensory deficit.     Motor: No abnormal muscle tone.     Coordination: Coordination normal.  Psychiatric:        Mood and Affect: Mood normal.        Behavior: Behavior normal.        Thought Content: Thought content normal.         Judgment: Judgment normal.     ED Results / Procedures / Treatments   Labs (all labs ordered are listed, but only abnormal results are displayed) Labs Reviewed - No data to display  EKG None  Radiology No results found.  Procedures Procedures   Medications Ordered in ED Medications - No data to display  ED Course  I have reviewed the triage vital signs and the nursing notes.  Pertinent labs & imaging results that were available during my care of the patient were reviewed by me and considered in my medical decision making (see chart for details).    MDM Rules/Calculators/A&P                           Patient Vitals for the past 24 hrs:  BP Temp Temp src Pulse Resp SpO2 Height Weight  12/10/20 1944 -- -- -- -- -- -- 6\' 1"  (1.854 m) 77 kg  12/10/20 1938 (!) 148/80 97.6 F (36.4 C) Oral (!) 116 16 99 % 6\' 1"  (1.854 m) 77.1 kg    10:00 PM Reevaluation with update and discussion. After initial assessment and treatment, an updated evaluation reveals after Foley catheter irrigation by nursing, the patient states he is urinating well without pressure sensation in his abdomen.  Urine and fluid expelled post irrigation is somewhat cloudy.  No apparent blood or blood clots in the urine.Daleen Bo   Medical Decision Making:  This patient is presenting for evaluation of urinary retention, which does not require a range of treatment options, and is not a complaint that involves a high risk of morbidity and mortality. The differential diagnoses include urinary retention, bladder outlet obstruction, UTI. I decided to review old records, and in summary patient with nonspecific complaints, likely urinary outlet obstruction,  without fever.  I obtained additional historical information from wife at bedside.    Critical Interventions-clinical evaluation, Foley catheter irrigation by nursing, resolution of bladder outlet obstruction.  After These Interventions, the Patient was  reevaluated and was found stable for discharge.  Doubt significant infection, metabolic disorder or impending vascular collapse.  No indication for further ED evaluation.  He is stable for discharge  CRITICAL CARE-no Performed by: Daleen Bo  Nursing Notes Reviewed/ Care Coordinated Applicable Imaging Reviewed Interpretation of Laboratory Data incorporated into ED treatment  The patient appears reasonably screened and/or stabilized for discharge and I doubt any other medical condition or other [facility] requiring further screening, evaluation, or treatment in the ED at this time prior to discharge.  Plan: Home Medications-continue usual; Home Treatments-Foley catheter care at home; return here if the recommended treatment, does not improve the symptoms; Recommended follow up-urology and PCP.     Final Clinical Impression(s) / ED Diagnoses Final diagnoses:  Urinary retention    Rx / DC Orders ED Discharge Orders    None       Daleen Bo, MD 12/10/20 2202

## 2020-12-10 NOTE — ED Notes (Signed)
Patient expressed relief post foley irrigation.

## 2020-12-10 NOTE — Discharge Instructions (Addendum)
See your urologist for further care and treatment.

## 2020-12-10 NOTE — ED Notes (Signed)
Patient was called for triage but is currently in restroom.

## 2020-12-10 NOTE — ED Triage Notes (Signed)
Pt to ED by POV from home with c/o complications related to foley catheter. Foley was placed 2 weeks ago due to UTI following cardiac cath procedure. Catheter was replaced on Friday, however pt states it "Stopped working" around 1500 today. Arrives in obvious discomfort, VSS.

## 2020-12-13 ENCOUNTER — Emergency Department (HOSPITAL_COMMUNITY)
Admission: EM | Admit: 2020-12-13 | Discharge: 2020-12-13 | Disposition: A | Discharge status: Home / Self Care | Payer: Medicare Other | Attending: Emergency Medicine | Admitting: Emergency Medicine

## 2020-12-13 ENCOUNTER — Encounter (HOSPITAL_COMMUNITY): Payer: Self-pay | Admitting: Emergency Medicine

## 2020-12-13 ENCOUNTER — Emergency Department (HOSPITAL_COMMUNITY)
Admission: EM | Admit: 2020-12-13 | Discharge: 2020-12-13 | Disposition: A | Discharge status: Home / Self Care | Payer: Medicare Other | Source: Home / Self Care | Attending: Emergency Medicine | Admitting: Emergency Medicine

## 2020-12-13 ENCOUNTER — Other Ambulatory Visit: Payer: Self-pay

## 2020-12-13 DIAGNOSIS — T83098A Other mechanical complication of other indwelling urethral catheter, initial encounter: Secondary | ICD-10-CM | POA: Insufficient documentation

## 2020-12-13 DIAGNOSIS — T83018A Breakdown (mechanical) of other indwelling urethral catheter, initial encounter: Secondary | ICD-10-CM

## 2020-12-13 DIAGNOSIS — I110 Hypertensive heart disease with heart failure: Secondary | ICD-10-CM | POA: Insufficient documentation

## 2020-12-13 DIAGNOSIS — Z79899 Other long term (current) drug therapy: Secondary | ICD-10-CM | POA: Insufficient documentation

## 2020-12-13 DIAGNOSIS — Z87891 Personal history of nicotine dependence: Secondary | ICD-10-CM | POA: Insufficient documentation

## 2020-12-13 DIAGNOSIS — R1030 Lower abdominal pain, unspecified: Secondary | ICD-10-CM | POA: Insufficient documentation

## 2020-12-13 DIAGNOSIS — Z96652 Presence of left artificial knee joint: Secondary | ICD-10-CM | POA: Insufficient documentation

## 2020-12-13 DIAGNOSIS — N390 Urinary tract infection, site not specified: Secondary | ICD-10-CM | POA: Diagnosis not present

## 2020-12-13 DIAGNOSIS — I252 Old myocardial infarction: Secondary | ICD-10-CM | POA: Diagnosis not present

## 2020-12-13 DIAGNOSIS — Z955 Presence of coronary angioplasty implant and graft: Secondary | ICD-10-CM | POA: Insufficient documentation

## 2020-12-13 DIAGNOSIS — R1110 Vomiting, unspecified: Secondary | ICD-10-CM | POA: Insufficient documentation

## 2020-12-13 DIAGNOSIS — Z794 Long term (current) use of insulin: Secondary | ICD-10-CM | POA: Diagnosis not present

## 2020-12-13 DIAGNOSIS — I5043 Acute on chronic combined systolic (congestive) and diastolic (congestive) heart failure: Secondary | ICD-10-CM | POA: Insufficient documentation

## 2020-12-13 DIAGNOSIS — Z7902 Long term (current) use of antithrombotics/antiplatelets: Secondary | ICD-10-CM | POA: Insufficient documentation

## 2020-12-13 DIAGNOSIS — Z7982 Long term (current) use of aspirin: Secondary | ICD-10-CM | POA: Diagnosis not present

## 2020-12-13 DIAGNOSIS — R339 Retention of urine, unspecified: Secondary | ICD-10-CM

## 2020-12-13 DIAGNOSIS — E119 Type 2 diabetes mellitus without complications: Secondary | ICD-10-CM | POA: Insufficient documentation

## 2020-12-13 DIAGNOSIS — I2510 Atherosclerotic heart disease of native coronary artery without angina pectoris: Secondary | ICD-10-CM | POA: Insufficient documentation

## 2020-12-13 DIAGNOSIS — Z9861 Coronary angioplasty status: Secondary | ICD-10-CM | POA: Insufficient documentation

## 2020-12-13 DIAGNOSIS — Z7984 Long term (current) use of oral hypoglycemic drugs: Secondary | ICD-10-CM | POA: Insufficient documentation

## 2020-12-13 LAB — URINALYSIS, ROUTINE W REFLEX MICROSCOPIC
Bilirubin Urine: NEGATIVE
Glucose, UA: >=500 mg/dL — AB
Ketones, ur: NEGATIVE mg/dL
Nitrite: NEGATIVE
Protein, ur: NEGATIVE mg/dL
RBC / HPF: >50 RBC/hpf — ABNORMAL HIGH (ref 0–5)
Specific Gravity, Urine: 1.016 (ref 1.005–1.030)
WBC, UA: >50 WBC/hpf — ABNORMAL HIGH (ref 0–5)
pH: 5 (ref 5.0–8.0)

## 2020-12-13 MED ORDER — CEPHALEXIN 500 MG PO CAPS: 500.0000 mg | ORAL_CAPSULE | Freq: Three times a day (TID) | ORAL | 0 refills | Status: AC

## 2020-12-13 MED ORDER — CEPHALEXIN 500 MG PO CAPS: 500.0000 mg | ORAL_CAPSULE | Freq: Three times a day (TID) | ORAL | 0 refills | Status: DC

## 2020-12-13 MED ORDER — CEPHALEXIN 500 MG PO CAPS
500.0000 mg | ORAL_CAPSULE | Freq: Once | ORAL | Status: AC
  Administered 2020-12-13: 500 mg via ORAL
  Filled 2020-12-13: qty 1

## 2020-12-13 NOTE — ED Provider Notes (Signed)
[facility] DEPT Provider Note   CSN: [id number] Arrival date & time: 12/13/20  1732     History Chief Complaint  Patient presents with  . Urinary Retention    John Gamble is a 87 y.o. male.  87 year old male presents with suprapubic pressure x1 day.  Has indwelling Foley catheter and is currently being treated for a UTI.  Denies any flank pain.  No fever or chills.  Emesis on it.  Has had his catheter changed a few times.  States that he has not had empty his back for 6 hours.  No treatment use prior to arrival        Past Medical History:  Diagnosis Date  . Coronary artery disease   . Hyperlipidemia   . Hypertension   . MI, old   . OA (osteoarthritis)   . Pneumonia    hx of 2013     Patient Active Problem List   Diagnosis Date Noted  . Urinary retention 11/06/2020  . Hematuria 11/06/2020  . Unstable angina ([place]) 11/05/2020  . Acute on chronic combined systolic and diastolic CHF (congestive heart failure) ([place]) 11/04/2020  . Dyspnea on exertion   . Type 2 diabetes mellitus without complication, without long-term current use of insulin ([place]) 08/21/2017  . Abnormal nuclear stress test 05/06/2015  . Primary osteoarthritis of knee 09/19/2014  . S/P knee replacement 09/19/2014  . S/P total knee arthroplasty 09/19/2014  . Lower gastrointestinal bleed 01/30/2012  . Coronary artery disease   . MI, old   . Hypertension   . Hyperlipidemia     Past Surgical History:  Procedure Laterality Date  . CARDIAC CATHETERIZATION  01/26/1999   EF 55%  . CARDIAC CATHETERIZATION N/A 05/06/2015   Procedure: Left Heart Cath and Coronary Angiography;  Surgeon: Peter M [name], MD;  Location: [place] CV LAB;  Service: Cardiovascular;  Laterality: N/A;  . CARDIOVASCULAR STRESS TEST  02/22/2010   EF 61%  . corneal implant     left  . CORONARY ATHERECTOMY N/A 11/04/2020   Procedure: CORONARY ATHERECTOMY;  Surgeon: [name], Peter M, MD;  Location: [place] CV LAB;  Service: Cardiovascular;  Laterality: N/A;  . CORONARY STENT INTERVENTION N/A 11/04/2020   Procedure: CORONARY STENT INTERVENTION;  Surgeon: [name], Peter M, MD;  Location: [place] CV LAB;  Service: Cardiovascular;  Laterality: N/A;  . CORONARY STENT PLACEMENT  2000   LEFT CIRCUMFLEX CORONARY  . INGUINAL HERNIA REPAIR    . INTRAVASCULAR ULTRASOUND/IVUS N/A 11/04/2020   Procedure: Intravascular Ultrasound/IVUS;  Surgeon: [name], Peter M, MD;  Location: [place] CV LAB;  Service: Cardiovascular;  Laterality: N/A;  . REPLACEMENT TOTAL KNEE    . RIGHT/LEFT HEART CATH AND CORONARY ANGIOGRAPHY N/A 10/27/2020   Procedure: RIGHT/LEFT HEART CATH AND CORONARY ANGIOGRAPHY;  Surgeon: [name], Peter M, MD;  Location: [place] CV LAB;  Service: Cardiovascular;  Laterality: N/A;  . TOTAL KNEE ARTHROPLASTY Left 09/19/2014   Procedure: LEFT TOTAL KNEE ARTHROPLASTY;  Surgeon: Sydnee Cabal, MD;  Location: [facility] OR[facility];  Service: Orthopedics;  Laterality: Left;       Family History  Problem Relation Age of Onset  . Cancer Mother   . Cancer Father     Social History   Tobacco Use  . Smoking status: Former Smoker    Packs/day: 0.50    Years: 50.00    Pack years: 25.00    Quit date: 02/23/1989    Years since quitting: 31.8  . Smokeless tobacco: Never  Used  Vaping Use  . Vaping Use: Never used  Substance Use Topics  . Alcohol use: No  . Drug use: No    Home Medications Prior to Admission medications   Medication Sig Start Date End Date Taking? Authorizing Provider  acyclovir (ZOVIRAX) 800 MG tablet Take 800 mg by mouth daily.  03/03/14   [provider]  alfuzosin (UROXATRAL) 10 MG 24 hr tablet Take 1 tablet (10 mg total) by mouth daily with breakfast. 11/07/20   Kroeger, Lorelee Cover., PA-C  aspirin EC 81 MG tablet Take 81 mg by mouth daily before supper.    [provider]  cephALEXin (KEFLEX) 500 MG capsule Take 1 capsule (500 mg total) by mouth 3 (three) times  daily for 10 days. 12/13/20 12/23/20  Fatima Blank, MD  clopidogrel (PLAVIX) 75 MG tablet Take 1 tablet (75 mg total) by mouth daily. 10/27/20 10/27/21  [name], Peter M, MD  dorzolamide (TRUSOPT) 2 % ophthalmic solution Place 1 drop into the right eye at bedtime.    [provider]  finasteride (PROSCAR) 5 MG tablet Take 1 tablet (5 mg total) by mouth daily. 11/20/20   [name], Peter M, MD  Glucosamine-Chondroitin 250-200 MG TABS Take 2 tablets by mouth 2 (two) times daily.    [provider]  glucose blood (ONETOUCH ULTRA) test strip USE 1 STRIP DAILY. 08/20/20   [provider]  Lancets (ONETOUCH DELICA PLUS [id number]) [place] SMARTSIG:1 Unit(s) Topical Daily 07/01/20   [provider]  metFORMIN (GLUCOPHAGE) 500 MG tablet Take by mouth daily with breakfast. 11/20/20   [name], Peter M, MD  metoprolol succinate (TOPROL-XL) 25 MG 24 hr tablet Take 1 tablet (25 mg total) by mouth daily. 11/12/20   [name], Peter M, MD  Multiple Vitamin (MULTIVITAMIN WITH MINERALS) TABS tablet Take 1 tablet by mouth daily. Centrum Silver    [provider]  Omega-3 Fatty Acids (FISH OIL) 1200 MG CAPS Take 1,200 mg by mouth daily.    [provider]  [facility] ULTRA test strip daily. 08/20/20   [provider]  polyethylene glycol (MIRALAX / GLYCOLAX) packet Take 17 g by mouth daily. Mix with 8 oz liquid and drink    [provider]  prednisoLONE acetate (PRED FORTE) 1 % ophthalmic suspension Place 1 drop into the left eye daily. 09/12/20   [provider]  rosuvastatin (CRESTOR) 20 MG tablet Take 20 mg by mouth daily.    [provider]  sacubitril-valsartan (ENTRESTO) 49-51 MG Take 1 tablet by mouth 2 (two) times daily. 10/23/20   [name], Peter M, MD  spironolactone (ALDACTONE) 25 MG tablet Take 1 tablet (25 mg total) by mouth daily. 11/12/20   [name], Peter M, MD  Zinc 50 MG CAPS Take 50 mg by mouth daily with supper.     [provider]    Allergies    Atorvastatin, Oysters [shellfish allergy], and Sulfa antibiotics  Review of Systems   Review of Systems  All other systems reviewed and are negative.   Physical Exam Updated Vital Signs BP (!) 179/104   Pulse 79   Temp (!) 97.5 F (36.4 C) (Oral)   Resp 16   SpO2 97%   Physical Exam Vitals and nursing note reviewed.  Constitutional:      General: He is not in acute distress.    Appearance: Normal appearance. He is well-developed. He is not toxic-appearing.  HENT:     Head: Normocephalic and atraumatic.  Eyes:  General: Lids are normal.     Conjunctiva/sclera: Conjunctivae normal.     Pupils: Pupils are equal, round, and reactive to light.  Neck:     Thyroid: No thyroid mass.     Trachea: No tracheal deviation.  Cardiovascular:     Rate and Rhythm: Normal rate and regular rhythm.     Heart sounds: Normal heart sounds. No murmur heard. No gallop.   Pulmonary:     Effort: Pulmonary effort is normal. No respiratory distress.     Breath sounds: Normal breath sounds. No stridor. No decreased breath sounds, wheezing, rhonchi or rales.  Abdominal:     General: Bowel sounds are normal. There is no distension.     Palpations: Abdomen is soft.     Tenderness: There is abdominal tenderness in the suprapubic area. There is no rebound.  Musculoskeletal:        General: No tenderness. Normal range of motion.     Cervical back: Normal range of motion and neck supple.  Skin:    General: Skin is warm and dry.     Findings: No abrasion or rash.  Neurological:     Mental Status: He is alert and oriented to person, place, and time.     GCS: GCS eye subscore is 4. GCS verbal subscore is 5. GCS motor subscore is 6.     Cranial Nerves: No cranial nerve deficit.     Sensory: No sensory deficit.  Psychiatric:        Speech: Speech normal.        Behavior: Behavior normal.     ED Results / Procedures / Treatments   Labs (all labs ordered are  listed, but only abnormal results are displayed) Labs Reviewed  URINE CULTURE    EKG None  Radiology No results found.  Procedures Procedures   Medications Ordered in ED Medications - No data to display  ED Course  I have reviewed the triage vital signs and the nursing notes.  Pertinent labs & imaging results that were available during my care of the patient were reviewed by me and considered in my medical decision making (see chart for details).    MDM Rules/Calculators/A&P                          Was on Cipro for UTI was placed on Keflex recently.  Foley catheter flushed by nursing and 800 cc of urine has drained.  He feels much better.  Will follow-up with urology tomorrow Final Clinical Impression(s) / ED Diagnoses Final diagnoses:  None    Rx / DC Orders ED Discharge Orders    None       Lacretia Leigh, MD 12/13/20 2113

## 2020-12-13 NOTE — ED Provider Notes (Signed)
[facility] DEPT Provider Note  CSN: [id number] Arrival date & time: 12/13/20 0103  Chief Complaint(s) Urinary Retention  HPI John Gamble is a 87 y.o. male with a past medical history listed below including recent urinary retention requiring Foley catheter who presents to the ED for decreased urinary flow since 9:00 PM.  Patient was seen earlier in the week for a clogged Foley catheter.  Reports that he was having suprapubic discomfort due to the distention.  Denied any nausea or vomiting.  No other physical complaints.   HPI  Past Medical History Past Medical History:  Diagnosis Date  . Coronary artery disease   . Hyperlipidemia   . Hypertension   . MI, old   . OA (osteoarthritis)   . Pneumonia    hx of 2013    Patient Active Problem List   Diagnosis Date Noted  . Urinary retention 11/06/2020  . Hematuria 11/06/2020  . Unstable angina ([place]) 11/05/2020  . Acute on chronic combined systolic and diastolic CHF (congestive heart failure) ([place]) 11/04/2020  . Dyspnea on exertion   . Type 2 diabetes mellitus without complication, without long-term current use of insulin ([place]) 08/21/2017  . Abnormal nuclear stress test 05/06/2015  . Primary osteoarthritis of knee 09/19/2014  . S/P knee replacement 09/19/2014  . S/P total knee arthroplasty 09/19/2014  . Lower gastrointestinal bleed 01/30/2012  . Coronary artery disease   . MI, old   . Hypertension   . Hyperlipidemia    Home Medication(s) Prior to Admission medications   Medication Sig Start Date End Date Taking? Authorizing Provider  cephALEXin (KEFLEX) 500 MG capsule Take 1 capsule (500 mg total) by mouth 3 (three) times daily for 10 days. 12/13/20 12/23/20 Yes Guynell Kleiber, Grayce Sessions, MD  acyclovir (ZOVIRAX) 800 MG tablet Take 800 mg by mouth daily.  03/03/14   [provider]  alfuzosin (UROXATRAL) 10 MG 24 hr tablet Take 1 tablet (10 mg total) by mouth daily with breakfast. 11/07/20    Kroeger, Lorelee Cover., PA-C  aspirin EC 81 MG tablet Take 81 mg by mouth daily before supper.    [provider]  clopidogrel (PLAVIX) 75 MG tablet Take 1 tablet (75 mg total) by mouth daily. 10/27/20 10/27/21  [name], Peter M, MD  dorzolamide (TRUSOPT) 2 % ophthalmic solution Place 1 drop into the right eye at bedtime.    [provider]  finasteride (PROSCAR) 5 MG tablet Take 1 tablet (5 mg total) by mouth daily. 11/20/20   [name], Peter M, MD  Glucosamine-Chondroitin 250-200 MG TABS Take 2 tablets by mouth 2 (two) times daily.    [provider]  glucose blood (ONETOUCH ULTRA) test strip USE 1 STRIP DAILY. 08/20/20   [provider]  Lancets (ONETOUCH DELICA PLUS 123XX123) [facility] SMARTSIG:1 Unit(s) Topical Daily 07/01/20   [provider]  metFORMIN (GLUCOPHAGE) 500 MG tablet Take by mouth daily with breakfast. 11/20/20   [name], Peter M, MD  metoprolol succinate (TOPROL-XL) 25 MG 24 hr tablet Take 1 tablet (25 mg total) by mouth daily. 11/12/20   [name], Peter M, MD  Multiple Vitamin (MULTIVITAMIN WITH MINERALS) TABS tablet Take 1 tablet by mouth daily. Centrum Silver    [provider]  Omega-3 Fatty Acids (FISH OIL) 1200 MG CAPS Take 1,200 mg by mouth daily.    [provider]  [facility] ULTRA test strip daily. 08/20/20   [provider]  polyethylene glycol (MIRALAX / GLYCOLAX) packet Take 17 g by mouth daily.  Mix with 8 oz liquid and drink    [provider]  prednisoLONE acetate (PRED FORTE) 1 % ophthalmic suspension Place 1 drop into the left eye daily. 09/12/20   [provider]  rosuvastatin (CRESTOR) 20 MG tablet Take 20 mg by mouth daily.    [provider]  sacubitril-valsartan (ENTRESTO) 49-51 MG Take 1 tablet by mouth 2 (two) times daily. 10/23/20   [name], Peter M, MD  spironolactone (ALDACTONE) 25 MG tablet Take 1 tablet (25 mg total) by mouth daily. 11/12/20   [name], Peter M, MD  Zinc 50 MG CAPS  Take 50 mg by mouth daily with supper.     [provider]                                                                                                                                    Past Surgical History Past Surgical History:  Procedure Laterality Date  . CARDIAC CATHETERIZATION  01/26/1999   EF 55%  . CARDIAC CATHETERIZATION N/A 05/06/2015   Procedure: Left Heart Cath and Coronary Angiography;  Surgeon: Peter M [name], MD;  Location: [facility];  Service: Cardiovascular;  Laterality: N/A;  . CARDIOVASCULAR STRESS TEST  02/22/2010   EF 61%  . corneal implant     left  . CORONARY ATHERECTOMY N/A 11/04/2020   Procedure: CORONARY ATHERECTOMY;  Surgeon: [name], Peter M, MD;  Location: [facility];  Service: Cardiovascular;  Laterality: N/A;  . CORONARY STENT INTERVENTION N/A 11/04/2020   Procedure: CORONARY STENT INTERVENTION;  Surgeon: [name], Peter M, MD;  Location: [facility];  Service: Cardiovascular;  Laterality: N/A;  . CORONARY STENT PLACEMENT  2000   LEFT CIRCUMFLEX CORONARY  . INGUINAL HERNIA REPAIR    . INTRAVASCULAR ULTRASOUND/IVUS N/A 11/04/2020   Procedure: Intravascular Ultrasound/IVUS;  Surgeon: [name], Peter M, MD;  Location: [facility];  Service: Cardiovascular;  Laterality: N/A;  . REPLACEMENT TOTAL KNEE    . RIGHT/LEFT HEART CATH AND CORONARY ANGIOGRAPHY N/A 10/27/2020   Procedure: RIGHT/LEFT HEART CATH AND CORONARY ANGIOGRAPHY;  Surgeon: [name], Peter M, MD;  Location: [facility];  Service: Cardiovascular;  Laterality: N/A;  . TOTAL KNEE ARTHROPLASTY Left 09/19/2014   Procedure: LEFT TOTAL KNEE ARTHROPLASTY;  Surgeon: Sydnee Cabal, MD;  Location: [facility];  Service: Orthopedics;  Laterality: Left;   Family History Family History  Problem Relation Age of Onset  . Cancer Mother   . Cancer Father     Social History Social History   Tobacco Use  . Smoking status: Former Smoker    Packs/day: 0.50    Years: 50.00     Pack years: 25.00    Quit date: 02/23/1989    Years since quitting: 31.8  . Smokeless tobacco: Never Used  Vaping Use  . Vaping Use: Never used  Substance Use Topics  . Alcohol use: No  . Drug use:  No   Allergies Atorvastatin, Oysters [shellfish allergy], and Sulfa antibiotics  Review of Systems Review of Systems All other systems are reviewed and are negative for acute change except as noted in the HPI  Physical Exam Vital Signs  I have reviewed the triage vital signs BP (!) 154/89 (BP Location: Left Arm)   Pulse (!) 42   Temp (!) 97.5 F (36.4 C) (Oral)   Resp 16   Ht 6\' 1"  (1.854 m)   Wt 77.1 kg   SpO2 95%   BMI 22.43 kg/m   Physical Exam Vitals reviewed.  Constitutional:      General: He is not in acute distress.    Appearance: He is well-developed. He is not diaphoretic.  HENT:     Head: Normocephalic and atraumatic.     Jaw: No trismus.     Right Ear: External ear normal.     Left Ear: External ear normal.     Nose: Nose normal.  Eyes:     General: No scleral icterus.    Conjunctiva/sclera: Conjunctivae normal.  Neck:     Trachea: Phonation normal.  Cardiovascular:     Rate and Rhythm: Normal rate and regular rhythm.  Pulmonary:     Effort: Pulmonary effort is normal. No respiratory distress.     Breath sounds: No stridor.  Abdominal:     General: There is no distension.     Tenderness: There is no abdominal tenderness.  Genitourinary:    Comments: Indwelling foley in place. Cloudy urine. Musculoskeletal:        General: Normal range of motion.     Cervical back: Normal range of motion.  Neurological:     Mental Status: He is alert and oriented to person, place, and time.  Psychiatric:        Behavior: Behavior normal.     ED Results and Treatments Labs (all labs ordered are listed, but only abnormal results are displayed) Labs Reviewed  URINALYSIS, ROUTINE W REFLEX MICROSCOPIC - Abnormal; Notable for the following components:      Result  Value   APPearance CLOUDY (*)    Glucose, UA >=500 (*)    Hgb urine dipstick SMALL (*)    Leukocytes,Ua LARGE (*)    RBC / HPF >50 (*)    WBC, UA >50 (*)    Bacteria, UA RARE (*)    All other components within normal limits  URINE CULTURE                                                                                                                         EKG  EKG Interpretation  Date/Time:    Ventricular Rate:    PR Interval:    QRS Duration:   QT Interval:    QTC Calculation:   R Axis:     Text Interpretation:        Radiology No results found.  Pertinent labs & imaging results that were available during my care  of the patient were reviewed by me and considered in my medical decision making (see chart for details).  Medications Ordered in ED Medications  cephALEXin (KEFLEX) capsule 500 mg (has no administration in time range)                                                                                                                                    Procedures Procedures  (including critical care time)  Medical Decision Making / ED Course I have reviewed the nursing notes for this encounter and the patient's prior records (if available in EHR or on provided paperwork).   John Gamble was evaluated in Emergency Department on 12/13/2020 for the symptoms described in the history of present illness. He was evaluated in the context of the global COVID-19 pandemic, which necessitated consideration that the patient might be at risk for infection with the SARS-CoV-2 virus that causes COVID-19. Institutional protocols and algorithms that pertain to the evaluation of patients at risk for COVID-19 are in a state of rapid change based on information released by regulatory bodies including the CDC and federal and state organizations. These policies and algorithms were followed during the patient's care in the ED.  Clogged Foley catheter flushed by RN.  Cloudy urine was  retrieved positive for urinary tract infection.  Catheter now flowing.  We will treat with Keflex.  Urology follow-up.      Final Clinical Impression(s) / ED Diagnoses Final diagnoses:  Urinary catheter dysfunction, initial encounter [facility])  Lower urinary tract infectious disease   The patient appears reasonably screened and/or stabilized for discharge and I doubt any other medical condition or other [facility] requiring further screening, evaluation, or treatment in the ED at this time prior to discharge. Safe for discharge with strict return precautions.  Disposition: Discharge  Condition: Good  I have discussed the results, Dx and Tx plan with the patient/family who expressed understanding and agree(s) with the plan. Discharge instructions discussed at length. The patient/family was given strict return precautions who verbalized understanding of the instructions. No further questions at time of discharge.    ED Discharge Orders         Ordered    cephALEXin (KEFLEX) 500 MG capsule  3 times daily        12/13/20 0304          Follow Up: Urology  Call  to schedule an appointment for close follow up      This chart was dictated using voice recognition software.  Despite best efforts to proofread,  errors can occur which can change the documentation meaning.   Fatima Blank, MD 12/13/20 [phone]

## 2020-12-13 NOTE — ED Notes (Signed)
Applied new leg bag

## 2020-12-13 NOTE — ED Notes (Signed)
pt reports that his foley has not drained since 9:30pm.  Flushed with NS, met no resistance, attached a urine collection bag and cloudy urine with sediment is draining.  bladder scan not available

## 2020-12-13 NOTE — ED Triage Notes (Signed)
Pt states that his foley stopped draining around 10p. States that he last emptied it around 930p. Reports mild suprapubic pain. A&Ox4. States that urine has been clear.

## 2020-12-13 NOTE — ED Provider Notes (Signed)
Emergency Medicine Provider Triage Evaluation Note  John Gamble , a 87 y.o. male  was evaluated in triage.  Pt complains of urinary retention and suprapubic abdominal pain.  Patient reports that he emptied his catheter at 1330 with minimal output.  He has had worsening suprapubic abdominal pain throughout the day.  Patient was seen in emergency department last night for the same complaint.  Patient has a urinary catheter in place.  Patient states that his drain was unclogged and he was discharged home.  Patient had been taking cipro prescribed on 5/8 for urinary tract infection.  Yesterday patient was started on Keflex for 10 days.  Review of Systems  Positive: Urinary retention, suprapubic abdominal pain Negative: Fevers, chills, hematuria  Physical Exam  BP (!) 151/55 (BP Location: Left Arm)   Pulse 71   Temp (!) 97.5 F (36.4 C) (Oral)   Resp 18   SpO2 97%  Gen:   Awake, no distress   Resp:  Normal effort  MSK:   Moves extremities without difficulty  Other:  Abdomen soft, nondistended, tenderness to suprapubic area  Medical Decision Making  Medically screening exam initiated at 6:33 PM.  Appropriate orders placed.  ZAIDYN CLAIRE was informed that the remainder of the evaluation will be completed by another provider, this initial triage assessment does not replace that evaluation, and the importance of remaining in the ED until their evaluation is complete.  The patient appears stable so that the remainder of the work up may be completed by another provider.      Dyann Ruddle 12/13/20 1835    Lacretia Leigh, MD 12/16/20 [phone]

## 2020-12-13 NOTE — Discharge Instructions (Addendum)
Follow up with your urologist tomorrow

## 2020-12-13 NOTE — ED Triage Notes (Addendum)
Patient c/o clogged foley today. States last emptied at 1330 with little output since that time. Seen last night for same.

## 2020-12-13 NOTE — ED Notes (Signed)
Catheter irrigated and now draining properly. Will continue to monitor output.

## 2020-12-14 LAB — URINE CULTURE: Culture: >=100000 — AB

## 2020-12-15 ENCOUNTER — Telehealth: Payer: Self-pay | Admitting: Emergency Medicine

## 2020-12-15 LAB — URINE CULTURE: Culture: 30000 — AB

## 2020-12-15 NOTE — Telephone Encounter (Signed)
Post ED Visit - Positive Culture Follow-up  Culture report reviewed by antimicrobial stewardship pharmacist: [place] Team []  Elenor Quinones, Pharm.D. []  Heide Guile, Pharm.D., BCPS AQ-ID []  Parks Neptune, Pharm.D., BCPS []  Alycia Rossetti, Pharm.D., BCPS []  [place], [place].D., BCPS, AAHIVP []  Legrand Como, Pharm.D., BCPS, AAHIVP []  Salome Arnt, PharmD, BCPS []  Johnnette Gourd, PharmD, BCPS []  Hughes Better, PharmD, BCPS []  Leeroy Cha, PharmD []  Laqueta Linden, PharmD, BCPS []  Albertina Parr, PharmD  [place] Team []  Leodis Sias, PharmD []  Lindell Spar, PharmD []  Royetta Asal, PharmD []  Graylin Shiver, Rph []  Rema Fendt) Glennon Mac, PharmD []  Arlyn Dunning, PharmD []  Netta Cedars, PharmD []  Dia Sitter, PharmD []  Leone Haven, PharmD []  Gretta Arab, PharmD []  Theodis Shove, PharmD []  Peggyann Juba, PharmD []  Reuel Boom, PharmD   Positive urine culture Treated with cephalexin, organism sensitive to the same and no further patient follow-up is required at this time.  Hazle Nordmann 12/15/2020, 11:11 AM

## 2020-12-15 NOTE — Progress Notes (Signed)
ED Antimicrobial Stewardship Positive Culture Follow Up   John Gamble is an 87 y.o. male who presented to [facility] on 12/13/2020 with a chief complaint of  Chief Complaint  Patient presents with  . Urinary Retention    Recent Results (from the past 720 hour(s))  Urine culture     Status: Abnormal   Collection Time: 12/13/20  2:07 AM   Specimen: Urine, Random  Result Value Ref Range Status   Specimen Description   Final    URINE, RANDOM Performed at [facility] [address] [place], [place] [address]    Special Requests   Final    NONE Performed at [facility], [place] [address] [place], [place] [address]    Culture >=100,000 COLONIES/mL YEAST (A)  Final   Report Status 12/14/2020 FINAL  Final  Urine Culture     Status: None (Preliminary result)   Collection Time: 12/13/20  7:56 PM   Specimen: Urine, Random  Result Value Ref Range Status   Specimen Description   Final    URINE, RANDOM Performed at [facility] [address], [place], [place] [address]    Special Requests   Final    NONE Performed at [facility], [place] [address], [place], [place] [address]    Culture   Final    CULTURE REINCUBATED FOR BETTER GROWTH Performed at [facility], [place] [address], [place], [place] [address]    Report Status PENDING  Incomplete   Discontinue Keflex and start Fluconazole 100mg  PO once daily x 14 days   ED Provider: Baird Kay, MD   Debara Pickett 12/15/2020, 10:39 AM Student Pharmacist

## 2020-12-15 NOTE — Telephone Encounter (Signed)
Post ED Visit - Positive Culture Follow-up: Successful Patient Follow-Up  Culture assessed and recommendations reviewed by:  []  Elenor Quinones, Pharm.D. []  Heide Guile, Pharm.D., BCPS AQ-ID []  Parks Neptune, Pharm.D., BCPS []  Alycia Rossetti, Pharm.D., BCPS []  [place], [place].D., BCPS, AAHIVP []  Legrand Como, Pharm.D., BCPS, AAHIVP []  Salome Arnt, PharmD, BCPS []  Johnnette Gourd, PharmD, BCPS []  Hughes Better, PharmD, BCPS []  Leeroy Cha, PharmD  Positive urine culture  []  Patient discharged without antimicrobial prescription and treatment is now indicated [x]  Organism is resistant to prescribed ED discharge antimicrobial []  Patient with positive blood cultures  Changes discussed with ED provider: Baird Kay MD New antibiotic prescription stop cephalexin, start fluconazole 100mg  po x 14 days Called to [facility] @ [phone]  Contacted wife    Hazle Nordmann 12/15/2020, 11:19 AM

## 2020-12-15 NOTE — Telephone Encounter (Signed)
Post ED Visit - Positive Culture Follow-up: Successful Patient Follow-Up  Culture assessed and recommendations reviewed by:  []  Elenor Quinones, Pharm.D. []  Heide Guile, Pharm.D., BCPS AQ-ID []  Parks Neptune, Pharm.D., BCPS []  Alycia Rossetti, Pharm.D., BCPS []  [place], Pharm.D., BCPS, AAHIVP []  Legrand Como, Pharm.D., BCPS, AAHIVP []  Salome Arnt, PharmD, BCPS []  Johnnette Gourd, PharmD, BCPS []  Hughes Better, PharmD, BCPS []  Leeroy Cha, PharmD  Positive urine culture  []  Patient discharged without antimicrobial prescription and treatment is now indicated [x]  Organism is resistant to prescribed ED discharge antimicrobial []  Patient with positive blood cultures  Changes discussed with ED provider: Baird Kay MD New antibiotic prescription D/C keflex, start fluconazole 100mg  po once daily x 14 days      Hazle Nordmann 12/15/2020, 11:13 AM

## 2020-12-16 ENCOUNTER — Telehealth (HOSPITAL_BASED_OUTPATIENT_CLINIC_OR_DEPARTMENT_OTHER): Payer: Self-pay

## 2020-12-16 NOTE — Telephone Encounter (Signed)
Post ED Visit - Positive Culture Follow-up: Successful Patient Follow-Up  Culture assessed and recommendations reviewed by:  []  Elenor Quinones, Pharm.D. []  Heide Guile, Pharm.D., BCPS AQ-ID []  Parks Neptune, Pharm.D., BCPS []  Alycia Rossetti, Pharm.D., BCPS []  [place], Pharm.D., BCPS, AAHIVP []  Legrand Como, Pharm.D., BCPS, AAHIVP []  Salome Arnt, PharmD, BCPS []  Johnnette Gourd, PharmD, BCPS []  Hughes Better, PharmD, BCPS []  Leeroy Cha, PharmD Rhunette Croft  [place] student PharmD  Positive urine culture 30,000 colonies Yeast  [x]  Patient discharged without  prescription and treatment is now indicated   Changes discussed with ED provider: Krista Blue PA-C New prescription  "Fluconazole 100 mg po once daily x 14 d"  Contacted patient, date 12/16/20, time  Spoke with patient.  Informed of findings and need for prescription.  New Rx for Fluconazole called to [facility] @ [phone] and given to [facility].     Dortha Kern 12/16/2020, 10:26 AM

## 2020-12-18 NOTE — Progress Notes (Signed)
Cardiology Office Note    Date:  12/21/2020   ID:  John Gamble, DOB 1930/12/02, MRN [id number]  PCP:  Olive Bass, MD  Cardiologist:  Jiyan Walkowski [place], MD   Chief Complaint  Patient presents with  . Congestive Heart Failure      History of Present Illness: John Gamble is a 87 y.o. male who presents for follow up CAD. He has a history of coronary disease and is status post stenting of the left circumflex coronary in 2000. He had a normal stress Myoview in July of 2011. He also has a history of hypertension and hyperlipidemia.   In September 2016 he had a nuclear stress test that was abnormal. This led to a cardiac cath.. Stents were patent but he did have ostial RCA (nondominant) and diagonal disease. Since he was asymptomatic medical therapy was recommended.   He did have Covid 19 infection in January 2021. He was admitted to [facility] with hypoxia and bilateral PNA.  He brings a diary of his BP readings and his BP has been excellent. He notes that since his Covid infection his energy is not as good and he has more dyspnea on exertion. Before he was able to ride his exercise bike for 30 minutes straight. Now he has to stop a couple of times. No chest pain. Sometimes has a little left arm pain.   He was evaluated with a Myoview study which was high risk with EF 25% and multiple perfusion abnormalities. This led to a cardiac cath which showed patent stents in the dominant LCx. There was critical ostial LAD disease with heavy calcification. Fortunately right heart and LV filling pressures were ok. Patient returned for complex PCI with atherectomy on 11/04/20. In the interim he did develop hematuria on DAPT. Was seen in the ED and started on Keflex for possible UTI. Patient reported improvement so we did proceed with PCI involving atherectomy and stenting of the ostial LAD with DES. An excellent result was obtained. Unfortunately he developed recurrent urinary complaints and had some obstruction.  A Foley was placed with come clots noted. He was irrigated with clearing urine. Placed on antibiotics with plans to leave foley in place with outpatient Urology follow up. It was felt he had BPH. [name] was held due to UTI. A1c was 6.6%. He was continued on Entresto and Toprol XL and aldactone were added.   On follow up today he is doing OK. Still has foley in place but urine is clear. He has had intermittent catheter obstruction. Is scheduled to see urology. He does note improvement in his SOB. No edema. Weight is down 10 lbs since March. He is being treated for a urinary yeast infection.   Past Medical History:  Diagnosis Date  . Coronary artery disease   . Hyperlipidemia   . Hypertension   . MI, old   . OA (osteoarthritis)   . Pneumonia    hx of 2013     Past Surgical History:  Procedure Laterality Date  . CARDIAC CATHETERIZATION  01/26/1999   EF 55%  . CARDIAC CATHETERIZATION N/A 05/06/2015   Procedure: Left Heart Cath and Coronary Angiography;  Surgeon: Luree Palla M [place], MD;  Location: [facility] INVASIVE CV LAB;  Service: Cardiovascular;  Laterality: N/A;  . CARDIOVASCULAR STRESS TEST  02/22/2010   EF 61%  . corneal implant     left  . CORONARY ATHERECTOMY N/A 11/04/2020   Procedure: CORONARY ATHERECTOMY;  Surgeon: [place], Tniya Bowditch M, MD;  Location:  [place] INVASIVE CV LAB;  Service: Cardiovascular;  Laterality: N/A;  . CORONARY STENT INTERVENTION N/A 11/04/2020   Procedure: CORONARY STENT INTERVENTION;  Surgeon: [name], Erric Machnik M, MD;  Location: [place] CV LAB;  Service: Cardiovascular;  Laterality: N/A;  . CORONARY STENT PLACEMENT  2000   LEFT CIRCUMFLEX CORONARY  . INGUINAL HERNIA REPAIR    . INTRAVASCULAR ULTRASOUND/IVUS N/A 11/04/2020   Procedure: Intravascular Ultrasound/IVUS;  Surgeon: [name], Trenity Pha M, MD;  Location: [place] CV LAB;  Service: Cardiovascular;  Laterality: N/A;  . REPLACEMENT TOTAL KNEE    . RIGHT/LEFT HEART CATH AND CORONARY ANGIOGRAPHY N/A 10/27/2020   Procedure:  RIGHT/LEFT HEART CATH AND CORONARY ANGIOGRAPHY;  Surgeon: [name], Harolyn Cocker M, MD;  Location: [place] CV LAB;  Service: Cardiovascular;  Laterality: N/A;  . TOTAL KNEE ARTHROPLASTY Left 09/19/2014   Procedure: LEFT TOTAL KNEE ARTHROPLASTY;  Surgeon: Sydnee Cabal, MD;  Location: [facility] ORS;  Service: Orthopedics;  Laterality: Left;     Current Outpatient Medications  Medication Sig Dispense Refill  . acyclovir (ZOVIRAX) 800 MG tablet Take 800 mg by mouth daily.     Marland Kitchen aspirin EC 81 MG tablet Take 81 mg by mouth daily before supper.    . cephALEXin (KEFLEX) 500 MG capsule Take 1 capsule (500 mg total) by mouth 3 (three) times daily for 10 days. 30 capsule 0  . clopidogrel (PLAVIX) 75 MG tablet Take 1 tablet (75 mg total) by mouth daily. 90 tablet 3  . dorzolamide (TRUSOPT) 2 % ophthalmic solution Place 1 drop into the right eye at bedtime.    . finasteride (PROSCAR) 5 MG tablet Take 1 tablet (5 mg total) by mouth daily.    . fluconazole (DIFLUCAN) 100 MG tablet Take 100 mg by mouth daily.    . Glucosamine-Chondroitin 250-200 MG TABS Take 2 tablets by mouth 2 (two) times daily.    Marland Kitchen glucose blood (ONETOUCH ULTRA) test strip USE 1 STRIP DAILY.    Marland Kitchen Lancets (ONETOUCH DELICA PLUS [id number]) MISC SMARTSIG:1 Unit(s) Topical Daily    . metFORMIN (GLUCOPHAGE) 500 MG tablet Take by mouth daily with breakfast.    . metoprolol succinate (TOPROL-XL) 25 MG 24 hr tablet Take 1 tablet (25 mg total) by mouth daily. 90 tablet 3  . Multiple Vitamin (MULTIVITAMIN WITH MINERALS) TABS tablet Take 1 tablet by mouth daily. Centrum Silver    . Omega-3 Fatty Acids (FISH OIL) 1200 MG CAPS Take 1,200 mg by mouth daily.    Glory Rosebush ULTRA test strip daily.    . polyethylene glycol (MIRALAX / GLYCOLAX) packet Take 17 g by mouth daily. Mix with 8 oz liquid and drink    . prednisoLONE acetate (PRED FORTE) 1 % ophthalmic suspension Place 1 drop into the left eye daily.    . rosuvastatin (CRESTOR) 20 MG tablet Take 20 mg by  mouth daily.    . sacubitril-valsartan (ENTRESTO) 97-103 MG Take 1 tablet by mouth 2 (two) times daily. 180 tablet 3  . silodosin (RAPAFLO) 8 MG CAPS capsule Take 8 mg by mouth daily.    Marland Kitchen spironolactone (ALDACTONE) 25 MG tablet Take 1 tablet (25 mg total) by mouth daily. 90 tablet 3  . Zinc 50 MG CAPS Take 50 mg by mouth daily with supper.      No current facility-administered medications for this visit.    Allergies:   Atorvastatin, Oysters [shellfish allergy], and Sulfa antibiotics    Social History:  The patient  reports that he quit smoking about 31 years  ago. He has a 25.00 pack-year smoking history. He has never used smokeless tobacco. He reports that he does not drink alcohol and does not use drugs.   Family History:  The patient's family history includes Cancer in his father and mother.    ROS:  Please see the history of present illness.   Otherwise, review of systems are positive for none.   All other systems are reviewed and negative.    PHYSICAL EXAM: VS:  BP (!) 144/68 (BP Location: Right Arm, Patient Position: Sitting, Cuff Size: Normal)   Pulse 62   Ht 6\' 1"  (1.854 m)   Wt 174 lb 9.6 oz (79.2 kg)   BMI 23.04 kg/m  , BMI Body mass index is 23.04 kg/m. GENERAL:  Well appearing WM in NAD HEENT:  PERRL, EOMI, sclera are clear. Oropharynx is clear. NECK:  No jugular venous distention, carotid upstroke brisk and symmetric, no bruits, no thyromegaly or adenopathy LUNGS:  Clear to auscultation bilaterally CHEST:  Unremarkable HEART:  RRR,  PMI not displaced or sustained,S1 and S2 within normal limits, no S3, no S4: no clicks, no rubs, no murmurs ABD:  Soft, nontender. BS +, no masses or bruits. No hepatomegaly, no splenomegaly EXT:  2 + pulses throughout, no edema, no cyanosis no clubbing. Radial site without hematoma.  SKIN:  Warm and dry.  No rashes NEURO:  Alert and oriented x 3. Cranial nerves II through XII intact. PSYCH:  Cognitively intact  EKG:  EKG is not   ordered today.     Recent Labs: 10/29/2020: ALT 16 11/05/2020: Magnesium 2.1 11/06/2020: Hemoglobin 14.1; Platelets 238 11/12/2020: BUN 13; Creatinine, Ser 0.85; Potassium 4.3; Sodium 140    Lipid Panel    Component Value Date/Time   CHOL 91 11/05/2020 0243   TRIG 80 11/05/2020 0243   HDL 35 (L) 11/05/2020 0243   CHOLHDL 2.6 11/05/2020 0243   VLDL 16 11/05/2020 0243   LDLCALC 40 11/05/2020 0243      Wt Readings from Last 3 Encounters:  12/21/20 174 lb 9.6 oz (79.2 kg)  12/13/20 170 lb (77.1 kg)  12/10/20 169 lb 12.1 oz (77 kg)      Other studies Reviewed: Additional studies/ records that were reviewed today include:  November 2017. Cholesterol 164, triglycerides 300, HDL 32 LDL 83. A1c 6.7%. Dated 12/28/16: glucose 150, A1c 7%. Cholesterol 94, triglyderdes 198, HDL 30, LDL 38. Other chemistries normal.  Dated 07/06/17. Glucose 155, otherwise CMET normal. Cholesterol 109, triglycerides 164, HDL 35, LDL 59.  A1c 6.5%. Dated 07/17/18: cholesterol 108, triglycerides 167, HDL 38, LDL 55. Glucose 142. Otherwise CMET normal. A1c 6.7% Dated 07/22/19: cholesterol 103, triglycerides 187, HDL 36, LDL 53.  Dated 08/17/19: glucose 147. Otherwise CMET normal. Hgb 13.9.  Dated 01/23/20: Glucose 127. Otherwise CMET normal. Dated 07/27/20: glucose 165, otherwise CMET normal. Cholesterol 108, triglycerides 155, HDL 35, LDL 54. A1c 6.6%.    Myoview 10/21/20: Study Highlights   Nuclear stress EF: 22%. The left ventricular ejection fraction is severely decreased (<30%).  Defect 1: There is a defect present in the mid anterior, mid anteroseptal, mid inferolateral, mid anterolateral, apical anterior, apical inferior, apical lateral and apex location.  Findings consistent with prior large anterior apical myocardial infarction.  This is a high risk study. There is no evidence of ischemia . He has had a previous anterior apical MI   Cardiac cath 10/27/20:  RIGHT/LEFT HEART CATH AND CORONARY  ANGIOGRAPHY    Conclusion    Ost LAD to  Prox LAD lesion is 95% stenosed.  1st Diag lesion is 70% stenosed.  Previously placed Ost Cx to Prox Cx stent (unknown type) is widely patent.  Previously placed LPDA stent (unknown type) is widely patent.  Ost RCA to Prox RCA lesion is 90% stenosed.  There is severe left ventricular systolic dysfunction.  LV end diastolic pressure is normal.  The left ventricular ejection fraction is 25-35% by visual estimate.   1. Severe 2 vessel obstructive CAD.    - 90% ostial LAD. Severely calcified. 70% first diagonal    - patent stents in the proximal LCx and left PDA    - 90% ostial nondominant RCA 2. Severe LV dysfunction with regional wall motion abnormalities. 3. Normal LV filling pressures 4. Normal right heart pressures. 5. Preserved cardiac output.   Plan: recommend staged complex  PCI of the ostial LAD with atherectomy and stenting. The diagonal and RCA disease are old and involve small vessels. While CABG would be an option given his advanced age I would favor a percutaneous approach.  PCI 11/04/20: Procedures  CORONARY STENT INTERVENTION  CORONARY ATHERECTOMY  Intravascular Ultrasound/IVUS    Conclusion    Ost LAD to Prox LAD lesion is 95% stenosed.  A drug-eluting stent was successfully placed using a SYNERGY XD [id number].  Post intervention, there is a 0% residual stenosis.   1. Successful PCI of the ostial LAD with IVUS guidance, orbital atherectomy and DES x 1  Plan: DAPT for at least 6 months. Will observe overnight and anticipate DC in am.    Recommendations  Antiplatelet/Anticoag Recommend uninterrupted dual antiplatelet therapy with Aspirin 81mg  daily and Clopidogrel 75mg  daily for a minimum of 6 months (stable ischemic heart disease-Class I recommendation   Coronary Diagrams   Diagnostic Dominance: Left    Intervention     Implants     Echo 11/05/20: IMPRESSIONS    1. Diffuse hypokinesis  worse in inferior base with abnormal septal motion  . Left ventricular ejection fraction, by estimation, is 25 to 30%. The  left ventricle has severely decreased function. The left ventricle  demonstrates global hypokinesis. The left  ventricular internal cavity size was moderately dilated. There is mild  left ventricular hypertrophy. Left ventricular diastolic parameters are  indeterminate.  2. Right ventricular systolic function is normal. The right ventricular  size is normal. There is normal pulmonary artery systolic pressure.  3. The mitral valve is normal in structure. Mild mitral valve  regurgitation. No evidence of mitral stenosis.  4. The aortic valve is tricuspid. Aortic valve regurgitation is trivial.  No aortic stenosis is present.  5. The inferior vena cava is dilated in size with >50% respiratory  variability, suggesting right atrial pressure of 8 mmHg.     ASSESSMENT AND PLAN:  1. Coronary disease status post remote stenting of the left circumflex in 2000 with a bare-metal stent. Cath  in 2016 showed  Patent stents in LCx. New disease in diagonal and ostial nondominant RCA  that correlate with stress test results. He has been  experiencing some DOE since his Covid PNA. High risk Myoview study. EF down to 25-30%. Cardiac cath demonstrated critical ostial LAD stenosis. Now s/p successful PCI with atherectomy and DES. On DAPT for at least a year. Will continue to optimize CHF therapy. Plan to repeat Echo at the end of June.  2. Acute on chronic systolic CHF secondary to ischemic CM. S/p revascularization as noted. Will continue Toprol XL to 25 mg daily. continue Aldactone to  25 mg daily. Increase  Entresto to 97/103  mg bid. Will need to stay off Farxiga with bladder yeast infection. Repeat Echo in June.   3. Hypertension, well controlled.   4. Hyperlipidemia- cholesterol well controlled on Crestor.   5. Diabetes mellitus.  Reviewed recommendations for low carb diet and  weight loss. Per Dr. Garlon Hatchet.  6. UTI with obstructive uropathy and hematuria. Urine has cleared. Follow up with Urology.    Disposition:   FU with me in 3 months   Signed, Marua Qin [name], MD  12/21/2020 10:27 AM    [facility] [address], [place], [place]  [address] Phone: [phone]; Fax: [phone]

## 2020-12-21 ENCOUNTER — Encounter: Payer: Self-pay | Admitting: Cardiology

## 2020-12-21 ENCOUNTER — Telehealth: Payer: Self-pay | Admitting: Cardiology

## 2020-12-21 ENCOUNTER — Ambulatory Visit: Payer: Medicare Other | Admitting: Cardiology

## 2020-12-21 ENCOUNTER — Other Ambulatory Visit: Payer: Self-pay

## 2020-12-21 VITALS — BP 144/68 | HR 62 | Ht 73.0 in | Wt 174.6 lb

## 2020-12-21 DIAGNOSIS — I5022 Chronic systolic (congestive) heart failure: Secondary | ICD-10-CM

## 2020-12-21 DIAGNOSIS — E782 Mixed hyperlipidemia: Secondary | ICD-10-CM

## 2020-12-21 DIAGNOSIS — I10 Essential (primary) hypertension: Secondary | ICD-10-CM

## 2020-12-21 DIAGNOSIS — I2510 Atherosclerotic heart disease of native coronary artery without angina pectoris: Secondary | ICD-10-CM

## 2020-12-21 MED ORDER — SACUBITRIL-VALSARTAN 97-103 MG PO TABS: 1.0000 | ORAL_TABLET | Freq: Two times a day (BID) | ORAL | 3 refills | Status: DC

## 2020-12-21 NOTE — Telephone Encounter (Signed)
*  STAT* If patient is at the pharmacy, call can be transferred to refill team.   1. Which medications need to be refilled? (please list name of each medication and dose if known)  spironolactone (ALDACTONE) 25 MG tablet [[id number]]  metoprolol succinate (TOPROL-XL) 25 MG 24 hr tablet   2. Which pharmacy/location (including street and city if local pharmacy) is medication to be sent to? [place] [address] [place], [place] [address]  Phone:  [phone] Fax:  [phone]   3. Do they need a 30 day or 90 day supply? [name]

## 2020-12-21 NOTE — Patient Instructions (Signed)
Stop taking Farxiga  Increase Entresto to 97/103 mg twice a day.   Continue your other therapy.  We will repeat an Echo at the end of June.

## 2021-01-18 ENCOUNTER — Telehealth: Payer: Self-pay | Admitting: Cardiology

## 2021-01-18 NOTE — Telephone Encounter (Signed)
Pts wife called in stating that her husband is in the donut hole and insurance is wanting to charge an expensive amount for his medication... the pt only has 4 pills remaining and would only be able to get more Dr. [name] possibly had samples available or other options.. please advise.

## 2021-01-19 ENCOUNTER — Ambulatory Visit (HOSPITAL_COMMUNITY): Payer: Medicare Other | Attending: Cardiovascular Disease

## 2021-01-19 ENCOUNTER — Other Ambulatory Visit: Payer: Self-pay

## 2021-01-19 DIAGNOSIS — I2510 Atherosclerotic heart disease of native coronary artery without angina pectoris: Secondary | ICD-10-CM | POA: Diagnosis not present

## 2021-01-19 DIAGNOSIS — I10 Essential (primary) hypertension: Secondary | ICD-10-CM | POA: Diagnosis not present

## 2021-01-19 DIAGNOSIS — E782 Mixed hyperlipidemia: Secondary | ICD-10-CM

## 2021-01-19 DIAGNOSIS — I5022 Chronic systolic (congestive) heart failure: Secondary | ICD-10-CM | POA: Diagnosis not present

## 2021-01-19 LAB — ECHOCARDIOGRAM COMPLETE
Area-P 1/2: 2.74 cm2
S' Lateral: 3.4 cm

## 2021-01-19 MED ORDER — LOSARTAN POTASSIUM 100 MG PO TABS: 100.0000 mg | ORAL_TABLET | Freq: Every day | ORAL | 6 refills | Status: DC

## 2021-01-19 MED ORDER — SACUBITRIL-VALSARTAN 97-103 MG PO TABS: 1.0000 | ORAL_TABLET | Freq: Two times a day (BID) | ORAL | 3 refills | Status: DC

## 2021-01-19 NOTE — Telephone Encounter (Signed)
Spoke to patient's wife she stated John Gamble is too expensive.Stated he only has 4 tablets left.Advised to come by office to pick up patient assistance form.Dr.Jordan advised to finish taking Entresto then start Losartan 100 mg daily until we hear if approved for Entresto.

## 2021-03-21 NOTE — Progress Notes (Signed)
Cardiology Office Note    Date:  03/25/2021   ID:  John Gamble, DOB July 27, 1931, MRN [id number]  PCP:  Algis Greenhouse, MD  Cardiologist:  Alaze Garverick [name], MD   Chief Complaint  Patient presents with   Coronary Artery Disease   Congestive Heart Failure       History of Present Illness: John Gamble is a 87 y.o. male who presents for follow up CAD. He has a history of coronary disease and is status post stenting of the left circumflex coronary in 2000. He had a normal stress Myoview in July of 2011. He also has a history of hypertension and hyperlipidemia.   In September 2016 he had a nuclear stress test that was abnormal. This led to a cardiac cath.. Stents were patent but he did have ostial RCA (nondominant) and diagonal disease. Since he was asymptomatic medical therapy was recommended.   He did have Covid 19 infection in January 2021. He was admitted to [facility] with hypoxia and bilateral PNA.  He brings a diary of his BP readings and his BP has been excellent. He notes that since his Covid infection his energy is not as good and he has more dyspnea on exertion. Before he was able to ride his exercise bike for 30 minutes straight. Now he has to stop a couple of times. No chest pain. Sometimes has a little left arm pain.   He was evaluated with a Myoview study which was high risk with EF 25% and multiple perfusion abnormalities. This led to a cardiac cath which showed patent stents in the dominant LCx. There was critical ostial LAD disease with heavy calcification. Fortunately right heart and LV filling pressures were ok. Patient returned for complex PCI with atherectomy on 11/04/20. In the interim he did develop hematuria on DAPT. Was seen in the ED and started on Keflex for possible UTI. Patient reported improvement so we did proceed with PCI involving atherectomy and stenting of the ostial LAD with DES. An excellent result was obtained. Unfortunately he developed recurrent urinary  complaints and had some obstruction. A Foley was placed with come clots noted. He was irrigated with clearing urine. Placed on antibiotics with plans to leave foley in place with outpatient Urology follow up. It was felt he had BPH. Wilder Glade was held due to UTI. A1c was 6.6%. He was continued on Entresto and Toprol XL and aldactone were added. With revascularization and medical therapy there was significant improvement in LV function with EF up to 45-50%.   On follow up today he is doing well. No chest pain or indigestion. His bladder yeast infection has cleared. He had a growth removed from his left ear. Planning to go back to the gym. No edema. No SOB. Weight has increased.   Past Medical History:  Diagnosis Date   Coronary artery disease    Hyperlipidemia    Hypertension    MI, old    OA (osteoarthritis)    Pneumonia    hx of 2013     Past Surgical History:  Procedure Laterality Date   CARDIAC CATHETERIZATION  01/26/1999   EF 55%   CARDIAC CATHETERIZATION N/A 05/06/2015   Procedure: Left Heart Cath and Coronary Angiography;  Surgeon: Jatavian Calica M [name], MD;  Location: [place] [facility];  Service: Cardiovascular;  Laterality: N/A;   CARDIOVASCULAR STRESS TEST  02/22/2010   EF 61%   corneal implant     left   CORONARY ATHERECTOMY N/A 11/04/2020  Procedure: CORONARY ATHERECTOMY;  Surgeon: [name], Serai Tukes M, MD;  Location: [place] [facility];  Service: Cardiovascular;  Laterality: N/A;   CORONARY STENT INTERVENTION N/A 11/04/2020   Procedure: CORONARY STENT INTERVENTION;  Surgeon: [name], Maxie Slovacek M, MD;  Location: [place] [facility];  Service: Cardiovascular;  Laterality: N/A;   CORONARY STENT PLACEMENT  2000   LEFT CIRCUMFLEX CORONARY   INGUINAL HERNIA REPAIR     INTRAVASCULAR ULTRASOUND/IVUS N/A 11/04/2020   Procedure: Intravascular Ultrasound/IVUS;  Surgeon: [name], Montel Vanderhoof M, MD;  Location: [place] [facility];  Service: Cardiovascular;  Laterality: N/A;   REPLACEMENT TOTAL KNEE     RIGHT/LEFT  HEART CATH AND CORONARY ANGIOGRAPHY N/A 10/27/2020   Procedure: RIGHT/LEFT HEART CATH AND CORONARY ANGIOGRAPHY;  Surgeon: [name], Martez Weiand M, MD;  Location: [place] [facility];  Service: Cardiovascular;  Laterality: N/A;   TOTAL KNEE ARTHROPLASTY Left 09/19/2014   Procedure: LEFT TOTAL KNEE ARTHROPLASTY;  Surgeon: Sydnee Cabal, MD;  Location: [facility];  Service: Orthopedics;  Laterality: Left;     Current Outpatient Medications  Medication Sig Dispense Refill   acyclovir (ZOVIRAX) 800 MG tablet Take 800 mg by mouth daily.      aspirin EC 81 MG tablet Take 81 mg by mouth daily before supper.     clopidogrel (PLAVIX) 75 MG tablet Take 1 tablet (75 mg total) by mouth daily. 90 tablet 3   dorzolamide (TRUSOPT) 2 % ophthalmic solution Place 1 drop into the right eye at bedtime.     finasteride (PROSCAR) 5 MG tablet Take 1 tablet (5 mg total) by mouth daily.     fluconazole (DIFLUCAN) 100 MG tablet Take 100 mg by mouth daily.     Glucosamine-Chondroitin 250-200 MG TABS Take 2 tablets by mouth 2 (two) times daily.     glucose blood (ONETOUCH ULTRA) test strip USE 1 STRIP DAILY.     Lancets (ONETOUCH DELICA PLUS 123XX123) MISC SMARTSIG:1 Unit(s) Topical Daily     losartan (COZAAR) 100 MG tablet Take 1 tablet (100 mg total) by mouth daily. 30 tablet 6   metFORMIN (GLUCOPHAGE) 500 MG tablet Take by mouth daily with breakfast.     metoprolol succinate (TOPROL-XL) 25 MG 24 hr tablet Take 1 tablet (25 mg total) by mouth daily. 90 tablet 3   Multiple Vitamin (MULTIVITAMIN WITH MINERALS) TABS tablet Take 1 tablet by mouth daily. Centrum Silver     Omega-3 Fatty Acids (FISH OIL) 1200 MG CAPS Take 1,200 mg by mouth daily.     ONETOUCH ULTRA test strip daily.     polyethylene glycol (MIRALAX / GLYCOLAX) packet Take 17 g by mouth daily. Mix with 8 oz liquid and drink     prednisoLONE acetate (PRED FORTE) 1 % ophthalmic suspension Place 1 drop into the left eye daily.     rosuvastatin (CRESTOR) 20 MG tablet Take  20 mg by mouth daily.     silodosin (RAPAFLO) 8 MG CAPS capsule Take 8 mg by mouth daily.     spironolactone (ALDACTONE) 25 MG tablet Take 1 tablet (25 mg total) by mouth daily. 90 tablet 3   Zinc 50 MG CAPS Take 50 mg by mouth daily with supper.      No current facility-administered medications for this visit.    Allergies:   Atorvastatin, Oysters [shellfish allergy], and Sulfa antibiotics    Social History:  The patient  reports that he quit smoking about 32 years ago. His smoking use included cigarettes. He has a 25.00 pack-year smoking history.  He has never used smokeless tobacco. He reports that he does not drink alcohol and does not use drugs.   Family History:  The patient's family history includes Cancer in his father and mother.    ROS:  Please see the history of present illness.   Otherwise, review of systems are positive for none.   All other systems are reviewed and negative.    PHYSICAL EXAM: VS:  BP 130/70   Pulse 62   Resp 20   Ht '6\' 1"'$  (1.854 m)   Wt 189 lb 3.2 oz (85.8 kg)   SpO2 97%   BMI 24.96 kg/m  , BMI Body mass index is 24.96 kg/m. GENERAL:  Well appearing WM in NAD HEENT:  PERRL, EOMI, sclera are clear. Oropharynx is clear. NECK:  No jugular venous distention, carotid upstroke brisk and symmetric, no bruits, no thyromegaly or adenopathy LUNGS:  Clear to auscultation bilaterally CHEST:  Unremarkable HEART:  RRR,  PMI not displaced or sustained,S1 and S2 within normal limits, no S3, no S4: no clicks, no rubs, no murmurs ABD:  Soft, nontender. BS +, no masses or bruits. No hepatomegaly, no splenomegaly EXT:  2 + pulses throughout, no edema, no cyanosis no clubbing. Radial site without hematoma.  SKIN:  Warm and dry.  No rashes NEURO:  Alert and oriented x 3. Cranial nerves II through XII intact. PSYCH:  Cognitively intact  EKG:  EKG is not  ordered today.     Recent Labs: 10/29/2020: ALT 16 11/05/2020: Magnesium 2.1 11/06/2020: Hemoglobin 14.1;  Platelets 238 11/12/2020: BUN 13; Creatinine, Ser 0.85; Potassium 4.3; Sodium 140    Lipid Panel    Component Value Date/Time   CHOL 91 11/05/2020 0243   TRIG 80 11/05/2020 0243   HDL 35 (L) 11/05/2020 0243   CHOLHDL 2.6 11/05/2020 0243   VLDL 16 11/05/2020 0243   LDLCALC 40 11/05/2020 0243      Wt Readings from Last 3 Encounters:  03/25/21 189 lb 3.2 oz (85.8 kg)  12/21/20 174 lb 9.6 oz (79.2 kg)  12/13/20 170 lb (77.1 kg)      Other studies Reviewed: Additional studies/ records that were reviewed today include:  November 2017. Cholesterol 164, triglycerides 300, HDL 32 LDL 83. A1c 6.7%. Dated 12/28/16: glucose 150, A1c 7%. Cholesterol 94, triglyderdes 198, HDL 30, LDL 38. Other chemistries normal.  Dated 07/06/17. Glucose 155, otherwise CMET normal. Cholesterol 109, triglycerides 164, HDL 35, LDL 59.  A1c 6.5%. Dated 07/17/18: cholesterol 108, triglycerides 167, HDL 38, LDL 55. Glucose 142. Otherwise CMET normal. A1c 6.7% Dated 07/22/19: cholesterol 103, triglycerides 187, HDL 36, LDL 53.  Dated 08/17/19: glucose 147. Otherwise CMET normal. Hgb 13.9.  Dated 01/23/20: Glucose 127. Otherwise CMET normal. Dated 07/27/20: glucose 165, otherwise CMET normal. Cholesterol 108, triglycerides 155, HDL 35, LDL 54. A1c 6.6%.    Myoview 10/21/20: Study Highlights  Nuclear stress EF: 22%. The left ventricular ejection fraction is severely decreased (<30%). Defect 1: There is a defect present in the mid anterior, mid anteroseptal, mid inferolateral, mid anterolateral, apical anterior, apical inferior, apical lateral and apex location. Findings consistent with prior large anterior apical myocardial infarction. This is a high risk study. There is no evidence of ischemia . He has had a previous anterior apical MI   Cardiac cath 10/27/20:  RIGHT/LEFT HEART CATH AND CORONARY ANGIOGRAPHY    Conclusion    Ost LAD to Prox LAD lesion is 95% stenosed. 1st Diag lesion is 70% stenosed. Previously  placed [employer]  Cx to Prox Cx stent (unknown type) is widely patent. Previously placed LPDA stent (unknown type) is widely patent. Ost RCA to Prox RCA lesion is 90% stenosed. There is severe left ventricular systolic dysfunction. LV end diastolic pressure is normal. The left ventricular ejection fraction is 25-35% by visual estimate.   1. Severe 2 vessel obstructive CAD.    - 90% ostial LAD. Severely calcified. 70% first diagonal    - patent stents in the proximal LCx and left PDA    - 90% ostial nondominant RCA 2. Severe LV dysfunction with regional wall motion abnormalities. 3. Normal LV filling pressures 4. Normal right heart pressures. 5. Preserved cardiac output.    Plan: recommend staged complex  PCI of the ostial LAD with atherectomy and stenting. The diagonal and RCA disease are old and involve small vessels. While CABG would be an option given his advanced age I would favor a percutaneous approach.    PCI 11/04/20: Procedures  CORONARY STENT INTERVENTION  CORONARY ATHERECTOMY  Intravascular Ultrasound/IVUS    Conclusion    Ost LAD to Prox LAD lesion is 95% stenosed. A drug-eluting stent was successfully placed using a SYNERGY XD 3.50X16. Post intervention, there is a 0% residual stenosis.   1. Successful PCI of the ostial LAD with IVUS guidance, orbital atherectomy and DES x 1   Plan: DAPT for at least 6 months. Will observe overnight and anticipate DC in am.     Recommendations  Antiplatelet/Anticoag Recommend uninterrupted dual antiplatelet therapy with Aspirin '81mg'$  daily and Clopidogrel '75mg'$  daily for a minimum of 6 months (stable ischemic heart disease-Class I recommendation   Coronary Diagrams   Diagnostic Dominance: Left    Intervention     Implants      Echo 11/05/20: IMPRESSIONS     1. Diffuse hypokinesis worse in inferior base with abnormal septal motion  . Left ventricular ejection fraction, by estimation, is 25 to 30%. The  left ventricle has  severely decreased function. The left ventricle  demonstrates global hypokinesis. The left   ventricular internal cavity size was moderately dilated. There is mild  left ventricular hypertrophy. Left ventricular diastolic parameters are  indeterminate.   2. Right ventricular systolic function is normal. The right ventricular  size is normal. There is normal pulmonary artery systolic pressure.   3. The mitral valve is normal in structure. Mild mitral valve  regurgitation. No evidence of mitral stenosis.   4. The aortic valve is tricuspid. Aortic valve regurgitation is trivial.  No aortic stenosis is present.   5. The inferior vena cava is dilated in size with >50% respiratory  variability, suggesting right atrial pressure of 8 mmHg.    Echo 01/19/21: IMPRESSIONS     1. Left ventricular ejection fraction, by estimation, is 45 to 50%. The  left ventricle has mildly decreased function. The left ventricle  demonstrates regional wall motion abnormalities (see scoring  diagram/findings for description). Left ventricular  diastolic parameters are consistent with Grade I diastolic dysfunction  (impaired relaxation). There is severe akinesis of the left ventricular,  basal inferior wall.   2. Right ventricular systolic function is normal. The right ventricular  size is normal. There is normal pulmonary artery systolic pressure.   3. Right atrial size was mildly dilated.   4. The mitral valve is normal in structure. Trivial mitral valve  regurgitation.   5. The aortic valve is tricuspid. Aortic valve regurgitation is mild.   6. There is dilatation of the ascending aorta.   ASSESSMENT  AND PLAN:  1. Coronary disease status post remote stenting of the left circumflex in 2000 with a bare-metal stent. Cath  in 2016 showed  Patent stents in LCx. New disease in diagonal and ostial nondominant RCA  that correlate with stress test results. This year had High risk Myoview study. EF down to 25-30%.  Cardiac cath demonstrated critical ostial LAD stenosis. Now s/p successful PCI with atherectomy and DES in April.  On DAPT for at least a year. Fortunately LV function improved significantly with revascularization.  2. Acute on chronic systolic CHF secondary to ischemic CM. S/p revascularization as noted. Will continue Toprol XL to 25 mg daily. continue Aldactone to 25 mg daily. Increase  Entresto to 97/103  mg bid. Would avoid SGLT 2 inhibitor given bladder yeast infection. Repeat Echo showed significant improvement in EF to 45-50%.   3. Hypertension, well controlled.   4. Hyperlipidemia- cholesterol well controlled on Crestor.   5. Diabetes mellitus.  Reviewed recommendations for low carb diet and weight loss. Per Dr. Garlon Hatchet.  6. UTI with obstructive uropathy and hematuria. Urine has cleared. Follow up with Urology.    Disposition:   FU with me in 6 months   Signed, Sherrol Vicars [name], MD  03/25/2021 11:53 AM    [facility] [place], [place], [place]  [address] Phone: [phone]; Fax: [phone]

## 2021-03-25 ENCOUNTER — Other Ambulatory Visit: Payer: Self-pay

## 2021-03-25 ENCOUNTER — Ambulatory Visit: Payer: Medicare Other | Admitting: Cardiology

## 2021-03-25 ENCOUNTER — Encounter: Payer: Self-pay | Admitting: Cardiology

## 2021-03-25 VITALS — BP 130/70 | HR 62 | Resp 20 | Ht 73.0 in | Wt 189.2 lb

## 2021-03-25 DIAGNOSIS — I2510 Atherosclerotic heart disease of native coronary artery without angina pectoris: Secondary | ICD-10-CM | POA: Diagnosis not present

## 2021-03-25 DIAGNOSIS — I10 Essential (primary) hypertension: Secondary | ICD-10-CM

## 2021-03-25 DIAGNOSIS — E782 Mixed hyperlipidemia: Secondary | ICD-10-CM | POA: Diagnosis not present

## 2021-03-25 DIAGNOSIS — I5022 Chronic systolic (congestive) heart failure: Secondary | ICD-10-CM | POA: Diagnosis not present

## 2021-05-25 ENCOUNTER — Other Ambulatory Visit (HOSPITAL_COMMUNITY): Payer: Self-pay

## 2021-07-30 ENCOUNTER — Encounter (HOSPITAL_COMMUNITY): Payer: Self-pay | Admitting: Emergency Medicine

## 2021-07-30 ENCOUNTER — Inpatient Hospital Stay (HOSPITAL_COMMUNITY)
Admission: EM | Admit: 2021-07-30 | Discharge: 2021-08-05 | DRG: 378 | Disposition: A | Discharge status: Home / Self Care | Payer: Medicare Other | Attending: Internal Medicine | Admitting: Internal Medicine

## 2021-07-30 ENCOUNTER — Other Ambulatory Visit: Payer: Self-pay

## 2021-07-30 DIAGNOSIS — K5731 Diverticulosis of large intestine without perforation or abscess with bleeding: Secondary | ICD-10-CM | POA: Diagnosis not present

## 2021-07-30 DIAGNOSIS — C4490 Unspecified malignant neoplasm of skin, unspecified: Secondary | ICD-10-CM | POA: Diagnosis present

## 2021-07-30 DIAGNOSIS — E1122 Type 2 diabetes mellitus with diabetic chronic kidney disease: Secondary | ICD-10-CM | POA: Diagnosis present

## 2021-07-30 DIAGNOSIS — Z79899 Other long term (current) drug therapy: Secondary | ICD-10-CM

## 2021-07-30 DIAGNOSIS — I959 Hypotension, unspecified: Secondary | ICD-10-CM | POA: Diagnosis not present

## 2021-07-30 DIAGNOSIS — I252 Old myocardial infarction: Secondary | ICD-10-CM

## 2021-07-30 DIAGNOSIS — K921 Melena: Secondary | ICD-10-CM

## 2021-07-30 DIAGNOSIS — Z800 Family history of malignant neoplasm of digestive organs: Secondary | ICD-10-CM

## 2021-07-30 DIAGNOSIS — Z7902 Long term (current) use of antithrombotics/antiplatelets: Secondary | ICD-10-CM

## 2021-07-30 DIAGNOSIS — E1165 Type 2 diabetes mellitus with hyperglycemia: Secondary | ICD-10-CM | POA: Diagnosis present

## 2021-07-30 DIAGNOSIS — K922 Gastrointestinal hemorrhage, unspecified: Secondary | ICD-10-CM | POA: Diagnosis present

## 2021-07-30 DIAGNOSIS — Z955 Presence of coronary angioplasty implant and graft: Secondary | ICD-10-CM

## 2021-07-30 DIAGNOSIS — I5022 Chronic systolic (congestive) heart failure: Secondary | ICD-10-CM | POA: Diagnosis present

## 2021-07-30 DIAGNOSIS — Z87891 Personal history of nicotine dependence: Secondary | ICD-10-CM

## 2021-07-30 DIAGNOSIS — Z96653 Presence of artificial knee joint, bilateral: Secondary | ICD-10-CM | POA: Diagnosis present

## 2021-07-30 DIAGNOSIS — M1990 Unspecified osteoarthritis, unspecified site: Secondary | ICD-10-CM | POA: Diagnosis present

## 2021-07-30 DIAGNOSIS — K2970 Gastritis, unspecified, without bleeding: Secondary | ICD-10-CM

## 2021-07-30 DIAGNOSIS — Z91013 Allergy to seafood: Secondary | ICD-10-CM

## 2021-07-30 DIAGNOSIS — K2991 Gastroduodenitis, unspecified, with bleeding: Secondary | ICD-10-CM | POA: Diagnosis present

## 2021-07-30 DIAGNOSIS — I255 Ischemic cardiomyopathy: Secondary | ICD-10-CM | POA: Diagnosis present

## 2021-07-30 DIAGNOSIS — Z20822 Contact with and (suspected) exposure to covid-19: Secondary | ICD-10-CM | POA: Diagnosis present

## 2021-07-30 DIAGNOSIS — Z7982 Long term (current) use of aspirin: Secondary | ICD-10-CM

## 2021-07-30 DIAGNOSIS — Z882 Allergy status to sulfonamides status: Secondary | ICD-10-CM

## 2021-07-30 DIAGNOSIS — I5043 Acute on chronic combined systolic (congestive) and diastolic (congestive) heart failure: Secondary | ICD-10-CM | POA: Diagnosis present

## 2021-07-30 DIAGNOSIS — I130 Hypertensive heart and chronic kidney disease with heart failure and stage 1 through stage 4 chronic kidney disease, or unspecified chronic kidney disease: Secondary | ICD-10-CM | POA: Diagnosis present

## 2021-07-30 DIAGNOSIS — I472 Ventricular tachycardia, unspecified: Secondary | ICD-10-CM | POA: Diagnosis not present

## 2021-07-30 DIAGNOSIS — E785 Hyperlipidemia, unspecified: Secondary | ICD-10-CM | POA: Diagnosis present

## 2021-07-30 DIAGNOSIS — N182 Chronic kidney disease, stage 2 (mild): Secondary | ICD-10-CM | POA: Diagnosis present

## 2021-07-30 DIAGNOSIS — K2100 Gastro-esophageal reflux disease with esophagitis, without bleeding: Secondary | ICD-10-CM | POA: Diagnosis present

## 2021-07-30 DIAGNOSIS — Z888 Allergy status to other drugs, medicaments and biological substances status: Secondary | ICD-10-CM

## 2021-07-30 DIAGNOSIS — E119 Type 2 diabetes mellitus without complications: Secondary | ICD-10-CM

## 2021-07-30 DIAGNOSIS — E781 Pure hyperglyceridemia: Secondary | ICD-10-CM | POA: Diagnosis present

## 2021-07-30 DIAGNOSIS — I493 Ventricular premature depolarization: Secondary | ICD-10-CM | POA: Diagnosis not present

## 2021-07-30 DIAGNOSIS — I2510 Atherosclerotic heart disease of native coronary artery without angina pectoris: Secondary | ICD-10-CM | POA: Diagnosis present

## 2021-07-30 DIAGNOSIS — I10 Essential (primary) hypertension: Secondary | ICD-10-CM | POA: Diagnosis present

## 2021-07-30 DIAGNOSIS — D62 Acute posthemorrhagic anemia: Secondary | ICD-10-CM | POA: Diagnosis not present

## 2021-07-30 LAB — CBC
HCT: 42.7 % (ref 39.0–52.0)
HCT: 41.4 % (ref 39.0–52.0)
HCT: 36.6 % — ABNORMAL LOW (ref 39.0–52.0)
Hemoglobin: 14.4 g/dL (ref 13.0–17.0)
Hemoglobin: 13.9 g/dL (ref 13.0–17.0)
Hemoglobin: 13.1 g/dL (ref 13.0–17.0)
MCH: 31 pg (ref 26.0–34.0)
MCH: 30.8 pg (ref 26.0–34.0)
MCH: 31.8 pg (ref 26.0–34.0)
MCHC: 33.7 g/dL (ref 30.0–36.0)
MCHC: 33.6 g/dL (ref 30.0–36.0)
MCHC: 35.8 g/dL (ref 30.0–36.0)
MCV: 91.8 fL (ref 80.0–100.0)
MCV: 91.8 fL (ref 80.0–100.0)
MCV: 88.8 fL (ref 80.0–100.0)
Platelets: 251 10*3/uL (ref 150–400)
Platelets: 224 10*3/uL (ref 150–400)
Platelets: 242 10*3/uL (ref 150–400)
RBC: 4.65 MIL/uL (ref 4.22–5.81)
RBC: 4.51 MIL/uL (ref 4.22–5.81)
RBC: 4.12 MIL/uL — ABNORMAL LOW (ref 4.22–5.81)
RDW: 13.2 % (ref 11.5–15.5)
RDW: 13.2 % (ref 11.5–15.5)
RDW: 13.1 % (ref 11.5–15.5)
WBC: 8.5 10*3/uL (ref 4.0–10.5)
WBC: 7.2 10*3/uL (ref 4.0–10.5)
WBC: 7.7 10*3/uL (ref 4.0–10.5)
nRBC: 0 % (ref 0.0–0.2)
nRBC: 0 % (ref 0.0–0.2)
nRBC: 0 % (ref 0.0–0.2)

## 2021-07-30 LAB — RESP PANEL BY RT-PCR (FLU A&B, COVID) ARPGX2
Influenza A by PCR: NEGATIVE
Influenza B by PCR: NEGATIVE
SARS Coronavirus 2 by RT PCR: NEGATIVE

## 2021-07-30 LAB — COMPREHENSIVE METABOLIC PANEL
ALT: 25 U/L (ref 0–44)
AST: 19 U/L (ref 15–41)
Albumin: 3.9 g/dL (ref 3.5–5.0)
Alkaline Phosphatase: 73 U/L (ref 38–126)
Anion gap: 5 (ref 5–15)
BUN: 16 mg/dL (ref 8–23)
CO2: 23 mmol/L (ref 22–32)
Calcium: 9.2 mg/dL (ref 8.9–10.3)
Chloride: 107 mmol/L (ref 98–111)
Creatinine, Ser: 0.95 mg/dL (ref 0.61–1.24)
GFR, Estimated: >60 mL/min (ref >60)
Glucose, Bld: 187 mg/dL — ABNORMAL HIGH (ref 70–99)
Potassium: 4.4 mmol/L (ref 3.5–5.1)
Sodium: 135 mmol/L (ref 135–145)
Total Bilirubin: 1 mg/dL (ref 0.3–1.2)
Total Protein: 6.4 g/dL — ABNORMAL LOW (ref 6.5–8.1)

## 2021-07-30 LAB — TYPE AND SCREEN
ABO/RH(D): A POS
Antibody Screen: NEGATIVE

## 2021-07-30 LAB — GLUCOSE, CAPILLARY: Glucose-Capillary: 214 mg/dL — ABNORMAL HIGH (ref 70–99)

## 2021-07-30 LAB — POC OCCULT BLOOD, ED: Fecal Occult Bld: POSITIVE — AB

## 2021-07-30 LAB — CBG MONITORING, ED: Glucose-Capillary: 140 mg/dL — ABNORMAL HIGH (ref 70–99)

## 2021-07-30 MED ORDER — DORZOLAMIDE HCL 2 % OP SOLN
1.0000 [drp] | Freq: Every day | OPHTHALMIC | Status: DC
  Administered 2021-07-30 – 2021-08-04 (×6): 1 [drp] via OPHTHALMIC
  Filled 2021-07-30: qty 10

## 2021-07-30 MED ORDER — ACETAMINOPHEN 325 MG PO TABS: 650.0000 mg | ORAL_TABLET | Freq: Four times a day (QID) | ORAL | Status: DC | PRN

## 2021-07-30 MED ORDER — ONDANSETRON HCL 4 MG PO TABS: 4.0000 mg | ORAL_TABLET | Freq: Four times a day (QID) | ORAL | Status: DC | PRN

## 2021-07-30 MED ORDER — TAMSULOSIN HCL 0.4 MG PO CAPS
0.4000 mg | ORAL_CAPSULE | Freq: Every day | ORAL | Status: DC
  Administered 2021-07-31 – 2021-08-05 (×6): 0.4 mg via ORAL
  Filled 2021-07-30 (×6): qty 1

## 2021-07-30 MED ORDER — ACETAMINOPHEN 650 MG RE SUPP: 650.0000 mg | Freq: Four times a day (QID) | RECTAL | Status: DC | PRN

## 2021-07-30 MED ORDER — SPIRONOLACTONE 12.5 MG HALF TABLET
12.5000 mg | ORAL_TABLET | Freq: Every day | ORAL | Status: DC
  Administered 2021-07-31 – 2021-08-01 (×2): 12.5 mg via ORAL
  Filled 2021-07-30 (×2): qty 1

## 2021-07-30 MED ORDER — ONDANSETRON HCL 4 MG/2ML IJ SOLN: 4.0000 mg | Freq: Four times a day (QID) | INTRAMUSCULAR | Status: DC | PRN

## 2021-07-30 MED ORDER — ROSUVASTATIN CALCIUM 20 MG PO TABS
20.0000 mg | ORAL_TABLET | Freq: Every day | ORAL | Status: DC
  Administered 2021-07-30 – 2021-08-05 (×7): 20 mg via ORAL
  Filled 2021-07-30 (×7): qty 1

## 2021-07-30 MED ORDER — PREDNISOLONE ACETATE 1 % OP SUSP
1.0000 [drp] | Freq: Every day | OPHTHALMIC | Status: DC
  Administered 2021-08-01 – 2021-08-05 (×5): 1 [drp] via OPHTHALMIC
  Filled 2021-07-30 (×2): qty 5

## 2021-07-30 MED ORDER — FINASTERIDE 5 MG PO TABS
5.0000 mg | ORAL_TABLET | Freq: Every day | ORAL | Status: DC
  Administered 2021-07-31 – 2021-08-05 (×6): 5 mg via ORAL
  Filled 2021-07-30 (×6): qty 1

## 2021-07-30 MED ORDER — INSULIN ASPART 100 UNIT/ML IJ SOLN
0.0000 [IU] | Freq: Three times a day (TID) | INTRAMUSCULAR | Status: DC
  Administered 2021-07-31: 12:00:00 5 [IU] via SUBCUTANEOUS
  Administered 2021-07-31: 19:00:00 2 [IU] via SUBCUTANEOUS
  Administered 2021-07-31: 07:00:00 1 [IU] via SUBCUTANEOUS
  Administered 2021-08-01: 2 [IU] via SUBCUTANEOUS
  Administered 2021-08-01: 13:00:00 5 [IU] via SUBCUTANEOUS
  Administered 2021-08-01 – 2021-08-02 (×2): 2 [IU] via SUBCUTANEOUS
  Administered 2021-08-02: 5 [IU] via SUBCUTANEOUS
  Administered 2021-08-02: 17:00:00 2 [IU] via SUBCUTANEOUS
  Administered 2021-08-03: 17:00:00 1 [IU] via SUBCUTANEOUS
  Administered 2021-08-03: 5 [IU] via SUBCUTANEOUS
  Administered 2021-08-03 – 2021-08-05 (×5): 2 [IU] via SUBCUTANEOUS

## 2021-07-30 MED ORDER — METOPROLOL TARTRATE 12.5 MG HALF TABLET
12.5000 mg | ORAL_TABLET | Freq: Two times a day (BID) | ORAL | Status: DC
  Administered 2021-07-30 – 2021-08-01 (×4): 12.5 mg via ORAL
  Filled 2021-07-30 (×5): qty 1

## 2021-07-30 MED ORDER — LOSARTAN POTASSIUM 50 MG PO TABS
100.0000 mg | ORAL_TABLET | Freq: Every day | ORAL | Status: DC
  Administered 2021-07-31 – 2021-08-01 (×2): 100 mg via ORAL
  Filled 2021-07-30 (×2): qty 2

## 2021-07-30 MED ORDER — SODIUM CHLORIDE 0.9% FLUSH
3.0000 mL | Freq: Two times a day (BID) | INTRAVENOUS | Status: DC
  Administered 2021-07-30 – 2021-08-05 (×11): 3 mL via INTRAVENOUS

## 2021-07-30 NOTE — ED Triage Notes (Signed)
Pt reports yesterday began with dark red bloody stools, had pain behind his belly button on Wednesday. Denies fever, chills, emesis.

## 2021-07-30 NOTE — ED Provider Notes (Signed)
Emergency Medicine Provider Triage Evaluation Note  John Gamble , a 87 y.o. male  was evaluated in triage.  Pt complains of rectal bleeding starting yesterday.  Patient had 2 episodes of dark mahogany looking stools yesterday, and multiple episodes of bright red blood in stool this morning.  He states he is lost 3 pounds since yesterday.  This is never happened before.  He is not on chronic anticoagulation.  He initially had some abdominal pain, describes it behind his navel.  He is not in any pain at this time.  History of hemorrhoids, has had several removed multiple years ago.  Review of Systems  Positive: Rectal bleeding, abdominal pain Negative: Rectal pain, fever, chills, nausea  Physical Exam  BP 132/78 (BP Location: Left Arm)    Pulse 87    Temp 98 F (36.7 C) (Oral)    Resp 14    Ht 6\' 1"  (1.854 m)    Wt 83 kg    SpO2 100%    BMI 24.14 kg/m  Gen:   Awake, no distress   Resp:  Normal effort  MSK:   Moves extremities without difficulty  Other:    Medical Decision Making  Medically screening exam initiated at 10:53 AM.  Appropriate orders placed.  John Gamble was informed that the remainder of the evaluation will be completed by another provider, this initial triage assessment does not replace that evaluation, and the importance of remaining in the ED until their evaluation is complete.     Estill Cotta 07/30/21 1054    Regan Lemming, MD 07/30/21 1222

## 2021-07-30 NOTE — Progress Notes (Signed)
Ot note Ot order received and pt being admitted. OT will follow up with admission at a later date/time.   Fleeta Emmer, OTR/L  Acute Rehabilitation Services Pager: [phone] Office: [phone] .

## 2021-07-30 NOTE — H&P (Signed)
Date: 07/30/2021               Patient Name:  John Gamble MRN: [id number]  DOB: Dec 13, 1930 Age / Sex: 87 y.o., male   PCP: Algis Greenhouse, MD         Medical Service: Internal Medicine Teaching Service         Attending Physician: Dr. Sid Falcon, MD    First Contact: Farrel Gordon, DO Pager: ED [phone]  Second Contact: Harvie Heck, MD Pager: Michel Bickers [phone]       After Hours (After 5p/  First Contact Pager: [phone]  weekends / holidays): Second Contact Pager: [phone]   SUBJECTIVE  Chief Complaint: rectal bleeding   History of Present Illness: John Gamble is a 87 y.o. male with a pertinent PMH of hypertension, type II diabetes mellitus, hyperlipidemia, coronary artery disease s/p left circumflex PCI in 2000 and PCI w/ atherectomy of ostial LAD with DES in 08/930, chronic systolic heart failure 2/2 ischemic cardiomyopathy (EF 45-50%), osteoarthritis s/p bilateral knee replacement, hx of suspected diverticular bleed in 2013, hemorrhoids s/p banding, multiple hernia repair surgeries who presents to [facility] with rectal bleeding. History obtained by patient and wife at bedside.   John Gamble states that yesterday morning he noted amber-colored bloody stools and that he had multiple repeat episodes throughout the day, noting that at one point he estimates about 1 pint of bright red blood per rectum lost.  He did not describe much stool content mixed with the blood.  He has continued to have episodes on day of admission as well, prompting him to present for further evaluation.  His bowel habits are inconsistent as he has intermittent constipation and diarrhea, and he does take MiraLAX for bowel support.  He does note 1 episode of explosive diarrhea approximately 1 week ago and states that this happens every so often over the last several years.  He has not had any pain associated with bowel movements, and has had mild umbilical pain.  He endorses some lightheadedness, dizziness however this  is normal for him and is unchanged.  He has not had any changes in his appetite, nausea, vomiting, fevers or chills, anal pain.  His last documented colonoscopy appears to have been in 2008 and at that time he was diagnosed with diverticulosis.  He was hospitalized in 2013 for GI bleeding with anticipated colonoscopy during that admission, however he was instructed to follow-up as an outpatient for this procedure.  In 2018, several telephone encounters attempting to schedule colonoscopy are noted but again it does not appear he has had a repeat study since 2008.  He has also been noted to have hemorrhoids.  Family history significant for colon cancer in his father.  He died of this at age 56, and it is believed that he was diagnosed around age 79.  In the ED, the patient was evaluated for rectal bleeding.  He is hemodynamically stable and labs without evidence of anemia noted.  He was however noted to have gross blood at the rectal vault on exam and is FOBT positive. GI has been consulted and have recommended that the patient be admission to internal medicine for observation and management.   Medications: No current facility-administered medications on file prior to encounter.   Current Outpatient Medications on File Prior to Encounter  Medication Sig Dispense Refill   acyclovir (ZOVIRAX) 800 MG tablet Take 800 mg by mouth daily.      aspirin EC  81 MG tablet Take 81 mg by mouth daily.     clopidogrel (PLAVIX) 75 MG tablet Take 1 tablet (75 mg total) by mouth daily. 90 tablet 3   dorzolamide (TRUSOPT) 2 % ophthalmic solution Place 1 drop into the right eye at bedtime.     finasteride (PROSCAR) 5 MG tablet Take 1 tablet (5 mg total) by mouth daily.     fluconazole (DIFLUCAN) 100 MG tablet Take 100 mg by mouth daily.     Glucosamine-Chondroitin 250-200 MG TABS Take 2 tablets by mouth 2 (two) times daily.     losartan (COZAAR) 100 MG tablet Take 1 tablet (100 mg total) by mouth daily. 30 tablet 6    metoprolol succinate (TOPROL-XL) 25 MG 24 hr tablet Take 1 tablet (25 mg total) by mouth daily. 90 tablet 3   Multiple Vitamin (MULTIVITAMIN WITH MINERALS) TABS tablet Take 1 tablet by mouth daily. Centrum Silver     Omega-3 Fatty Acids (FISH OIL) 1200 MG CAPS Take 1,200 mg by mouth daily.     polyethylene glycol (MIRALAX / GLYCOLAX) packet Take 17 g by mouth daily. Mix with 8 oz liquid and drink     prednisoLONE acetate (PRED FORTE) 1 % ophthalmic suspension Place 1 drop into the left eye daily.     rosuvastatin (CRESTOR) 20 MG tablet Take 20 mg by mouth daily.     silodosin (RAPAFLO) 8 MG CAPS capsule Take 8 mg by mouth daily.     spironolactone (ALDACTONE) 25 MG tablet Take 1 tablet (25 mg total) by mouth daily. (Patient taking differently: Take 12.5 mg by mouth daily.) 90 tablet 3   Zinc 50 MG CAPS Take 50 mg by mouth daily with supper.      glucose blood (ONETOUCH ULTRA) test strip USE 1 STRIP DAILY.     Lancets (ONETOUCH DELICA PLUS [id number]) MISC SMARTSIG:1 Unit(s) Topical Daily     ONETOUCH ULTRA test strip daily.      Past Medical History:  Past Medical History:  Diagnosis Date   Coronary artery disease    Hyperlipidemia    Hypertension    MI, old    OA (osteoarthritis)    Pneumonia    hx of 2013     Social:  Patient lives at home with his wife. Previously went to the gym 4-5x weekly however has not returned after unfavorable results on recent stress test.  He endorses eating a healthy diet. Independent in ADLs. Endorses three falls in the past two years; last fall was one year ago, and he relates it to the dizziness that he experiences every so often. Denies any tobacco use, alcohol use or illicit drug use. Does take fish oil and osteoflex vitamins.   Family History: Family History  Problem Relation Age of Onset   Cancer Mother    Cancer Father   Colon cancer in father at age 3; diagnosed at age 62  Allergies: Allergies as of 07/30/2021 - Review Complete 07/30/2021   Allergen Reaction Noted   Atorvastatin Other (See Comments) 06/30/2016   Oysters [shellfish allergy] Nausea Only 12/25/2013   Sulfa antibiotics Nausea And Vomiting 12/25/2013    Review of Systems: A complete ROS was negative except as per HPI.   OBJECTIVE:  Physical Exam: Blood pressure 133/83, pulse (!) 28, temperature 98 F (36.7 C), temperature source Oral, resp. rate (!) 23, height 6\' 1"  (1.854 m), weight 83 kg, SpO2 98 %. Constitutional: Elderly gentleman in no acute distress. HENT: Moist, pink oral mucosa. Cardio:  Regular rate and rhythm.  No murmurs, rubs, gallops. Pulm: Clear to auscultation bilaterally.  Normal work of breathing on room air. Abdomen: Soft, nontender, nondistended. MSK: Negative for extremity edema. Skin: Skin is warm and dry. Neuro: Alert and oriented x3.  No focal deficit noted. Psych: Normal mood and affect.  Pertinent Labs: CBC    Component Value Date/Time   WBC 8.5 07/30/2021 1100   RBC 4.65 07/30/2021 1100   HGB 14.4 07/30/2021 1100   HGB 15.4 10/23/2020 0000   HCT 42.7 07/30/2021 1100   HCT 47.1 10/23/2020 0000   PLT 251 07/30/2021 1100   PLT 268 10/23/2020 0000   MCV 91.8 07/30/2021 1100   MCV 93 10/23/2020 0000   MCH 31.0 07/30/2021 1100   MCHC 33.7 07/30/2021 1100   RDW 13.2 07/30/2021 1100   RDW 12.8 10/23/2020 0000   LYMPHSABS 1.6 11/06/2020 0756   LYMPHSABS 2.6 10/23/2020 0000   MONOABS 0.9 11/06/2020 0756   EOSABS 0.0 11/06/2020 0756   EOSABS 0.2 10/23/2020 0000   BASOSABS 0.1 11/06/2020 0756   BASOSABS 0.1 10/23/2020 0000     CMP     Component Value Date/Time   NA 135 07/30/2021 1100   NA 140 11/12/2020 1632   K 4.4 07/30/2021 1100   CL 107 07/30/2021 1100   CO2 23 07/30/2021 1100   GLUCOSE 187 (H) 07/30/2021 1100   BUN 16 07/30/2021 1100   BUN 13 11/12/2020 1632   CREATININE 0.95 07/30/2021 1100   CREATININE 0.93 04/28/2015 0941   CALCIUM 9.2 07/30/2021 1100   PROT 6.4 (L) 07/30/2021 1100   ALBUMIN 3.9  07/30/2021 1100   AST 19 07/30/2021 1100   ALT 25 07/30/2021 1100   ALKPHOS 73 07/30/2021 1100   BILITOT 1.0 07/30/2021 1100   GFRNONAA >60 07/30/2021 1100   GFRAA 79 (L) 09/20/2014 0518    Pertinent Imaging: No results found.  EKG: personally reviewed my interpretation is nonsustained ventricular tachycardia, sinus tachycardia, probable left ventricular hypertrophy with secondary repolarization abnormality.  Patient also noted to have frequent PVCs on telemetry.  ASSESSMENT & PLAN:  Assessment: Principal Problem:   GI bleed Active Problems:   Coronary artery disease   Hypertension   Hyperlipidemia   Type 2 diabetes mellitus without complication, without long-term current use of insulin (HCC)   Acute on chronic combined systolic and diastolic CHF (congestive heart failure) ([place])  John Gamble is a 87 y.o. male with a pertinent PMH of hypertension, type II diabetes mellitus, hyperlipidemia, coronary artery disease s/p left circumflex PCI in 2000 and PCI w/ atherectomy of ostial LAD with DES in 03/8279, chronic systolic heart failure 2/2 ischemic cardiomyopathy (EF 45-50%), osteoarthritis s/p bilateral knee replacement, hx of suspected diverticular bleed in 2013, hemorrhoids s/p banding, multiple hernia repair surgeries who presents to [facility] with rectal bleeding. and admitted for GI bleed on hospital day 0  Plan: #GI bleed Patient describes 2 days of bright red blood per rectum that is painless and not associated with abdominal pain.  Historically he has been noted to have hemorrhoids and diverticulosis during previous episodes of GI bleed.  Fortunately he is hemodynamically stable at this time.  FOBT positive. -GI consulted, appreciate their assistance -Transfuse if Hgb less than 7 -Trend CBC  #Hypertension Blood pressure has been mostly normotensive with occasional lability.  Managed with home losartan 100 mg daily. -Continue home losartan 100 mg daily.  #Hyperlipidemia History of  hyperlipidemia noted, most recent lipid panel on 12/27 remarkable only for  elevated triglycerides at 257.  Patient is on rosuvastatin 20 mg daily at home. -Continue rosuvastatin 20 mg daily  #History of urinary retention Currently managed with Flomax 0.4 mg daily, finasteride 5 mg daily. -Continue home Flomax 0.5 mg daily and finasteride 5 mg daily  #Systolic heart failure Patient noted to have acute exacerbation in August of this year secondary to ischemic cardiomyopathy.  He is status post coronary artery revascularization.  At home he is on metoprolol 25 mg daily, spironolactone 25 mg daily.  Most recent echocardiogram in June 2022 showed LVEF of 45 to 50% with mildly decreased function and regional wall motion abnormalities.  Is also noted to have severe akinesis of the LV basal inferior wall.  RV function and size is normal; normal pulmonary artery systolic pressure; mildly dilated right atrium; dilation of the ascending aorta.  On exam patient appears euvolemic. -Metoprolol 12.5 mg twice daily -Spironolactone 12.5 mg twice daily  #History of coronary artery disease Patient has had multiple stents placed, most recently in April 2022.  He has been on Plavix and aspirin since this procedure. -Hold Plavix and aspirin at this time given GI bleed  #Type 2 diabetes mellitus, insulin independent Most recent HbA1c on 12/27 of 7.4%.  The patient states that he does not medically manage his diabetes, just that he and his primary care physician monitor it. -SSI 3 times daily with meals  Best Practice: Diet: Clear liquid diet IVF: Fluids: IV push only, no IV fluids VTE: SCDs Start: 07/30/21 1411 Code: Full AB: None Status: Observation with expected length of stay less than 2 midnights. Anticipated Discharge Location: Home Barriers to Discharge: Medical stability  Signature: Harvie Heck, MD Internal Medicine Resident, PGY-3 [facility]  Pager: [phone] 2:39  PM, 07/30/2021   Please contact the on call pager after 5 pm and on weekends at [phone].

## 2021-07-30 NOTE — Hospital Course (Addendum)
#  GI bleed Patient presented with chief complaint of 2 days of bright red blood per rectum that was painless and not associated with abdominal pain.  Admission labs were significant for positive FOBT however otherwise within normal limits and he was hemodynamically stable.  He did experience recurrent episodes of bloody bowel movements overnight, however was largely hemodynamically stable without decreases in hemoglobin.  He did have hypotensive episodes resulting in discontinuing metoprolol 12.5 mg twice daily.   #Hypertension Patient was continued on home losartan 100 mg daily.   #Hyperlipidemia Patient was continued on home rosuvastatin 20 mg daily.   #History of urinary retention Patient was continued on home Flomax 0.4 mg daily, finasteride 5 mg daily.   #Systolic heart failure On admission, patient was euvolemic with no signs of heart failure exacerbation.  He was continued on home doses of metoprolol 25 mg daily and spironolactone 25 mg daily.  Metoprolol was held in the setting of hypotensive episodes.   #History of coronary artery disease Patient is on Plavix and aspirin for recent stent placement.  These were held during admission secondary to acute GI bleed and in anticipation of colonoscopy on 08/03/2021.   #Type 2 diabetes mellitus, insulin independent HbA1c noted to be 6.9%.  He was managed with sliding scale insulin 3 times daily with meals.

## 2021-07-30 NOTE — Consult Note (Addendum)
Consultation  Referring Provider: ER MD / Tomi Bamberger Primary Care Physician:  Algis Greenhouse, MD Primary Gastroenterologist:  Dr. Daiva Nakayama [facility]  Reason for Consultation: GI bleeding  HPI: John Gamble is a 87 y.o. male, who presented to the ED earlier today after onset of dark red blood per rectum yesterday morning.  Patient says that his stool is burgundy appearing.  He had 1 episode yesterday morning, and another episode with about the same amount of volume yesterday afternoon.  When he got up this morning he passed another grossly bloody bowel movement and decided to come in for evaluation.  He says he had 1 bowel movement after arriving in the ER. He has no prior history of GI bleeding, no complaints of abdominal pain or cramping, no nausea or vomiting, no dizziness lightheadedness or presyncope prior to arrival. He has been hemodynamically stable in the ER Initial hemoglobin 14.4/hematocrit 42.7/MCV of 91.8 BUN 16/creatinine 0.95 Respiratory panel pending  Patient does have history of coronary artery disease and is status post drug-eluting stent to the LAD in April 2022 and has been on Plavix and aspirin since.  He did take both his Plavix and his aspirin this morning. He reports that he takes MiraLAX daily for regular bowel movements, does have occasional episodes of urgency and incontinence which are not associated with diarrhea.  By review of epic patient did have admission in 2013 with presumed diverticular bleeding with painless rectal bleeding for 5 days.  He had diverticulosis confirmed on CT scan, did not have GI evaluation during that admission, but hospitalist did discuss his course with Dr. Earlean Shawl who plan to follow him up as an outpatient.  He had prior colonoscopy with Dr. Earlean Shawl, patient says that was 5 or 6 years ago but his wife believes it may have been longer ago than that.  Patient says he was told that he did not have any polyps but did have diverticulosis and  internal hemorrhoids.  He did have hemorrhoidal banding through Dr. Earlean Shawl around that same time. I called Dr. Earlean Shawl 's office previous office-have no record of colonoscopy as far back as 2017.  Other medical problems include hypertension, adult onset diabetes mellitus, chronic kidney disease stage III and ischemic cardiomyopathy with EF of 45 to 50%, osteoarthritis   Past Medical History:  Diagnosis Date   Coronary artery disease    Hyperlipidemia    Hypertension    MI, old    OA (osteoarthritis)    Pneumonia    hx of 2013     Past Surgical History:  Procedure Laterality Date   CARDIAC CATHETERIZATION  01/26/1999   EF 55%   CARDIAC CATHETERIZATION N/A 05/06/2015   Procedure: Left Heart Cath and Coronary Angiography;  Surgeon: Peter M [name], MD;  Location: [place] [facility];  Service: Cardiovascular;  Laterality: N/A;   CARDIOVASCULAR STRESS TEST  02/22/2010   EF 61%   corneal implant     left   CORONARY ATHERECTOMY N/A 11/04/2020   Procedure: CORONARY ATHERECTOMY;  Surgeon: [name], Peter M, MD;  Location: [place] [facility];  Service: Cardiovascular;  Laterality: N/A;   CORONARY STENT INTERVENTION N/A 11/04/2020   Procedure: CORONARY STENT INTERVENTION;  Surgeon: [name], Peter M, MD;  Location: [place] [facility];  Service: Cardiovascular;  Laterality: N/A;   CORONARY STENT PLACEMENT  2000   LEFT CIRCUMFLEX CORONARY   INGUINAL HERNIA REPAIR     INTRAVASCULAR ULTRASOUND/IVUS N/A 11/04/2020   Procedure: Intravascular Ultrasound/IVUS;  Surgeon: [name], Peter M, MD;  Location: [place] [facility];  Service: Cardiovascular;  Laterality: N/A;   REPLACEMENT TOTAL KNEE     RIGHT/LEFT HEART CATH AND CORONARY ANGIOGRAPHY N/A 10/27/2020   Procedure: RIGHT/LEFT HEART CATH AND CORONARY ANGIOGRAPHY;  Surgeon: [name], Peter M, MD;  Location: [place] [facility];  Service: Cardiovascular;  Laterality: N/A;   TOTAL KNEE ARTHROPLASTY Left 09/19/2014   Procedure: LEFT TOTAL KNEE ARTHROPLASTY;  Surgeon:  Sydnee Cabal, MD;  Location: [facility];  Service: Orthopedics;  Laterality: Left;    Prior to Admission medications   Medication Sig Start Date End Date Taking? Authorizing Provider  acyclovir (ZOVIRAX) 800 MG tablet Take 800 mg by mouth daily.  03/03/14  Yes [provider]  aspirin EC 81 MG tablet Take 81 mg by mouth daily.   Yes [provider]  clopidogrel (PLAVIX) 75 MG tablet Take 1 tablet (75 mg total) by mouth daily. 10/27/20 10/27/21 Yes [name], Peter M, MD  dorzolamide (TRUSOPT) 2 % ophthalmic solution Place 1 drop into the right eye at bedtime.   Yes [provider]  finasteride (PROSCAR) 5 MG tablet Take 1 tablet (5 mg total) by mouth daily. 11/20/20  Yes [name], Peter M, MD  fluconazole (DIFLUCAN) 100 MG tablet Take 100 mg by mouth daily. 12/15/20  Yes [provider]  Glucosamine-Chondroitin 250-200 MG TABS Take 2 tablets by mouth 2 (two) times daily.   Yes [provider]  losartan (COZAAR) 100 MG tablet Take 1 tablet (100 mg total) by mouth daily. 01/19/21 07/30/21 Yes [name], Peter M, MD  metoprolol succinate (TOPROL-XL) 25 MG 24 hr tablet Take 1 tablet (25 mg total) by mouth daily. 11/12/20  Yes [name], Peter M, MD  Multiple Vitamin (MULTIVITAMIN WITH MINERALS) TABS tablet Take 1 tablet by mouth daily. Centrum Silver   Yes [provider]  Omega-3 Fatty Acids (FISH OIL) 1200 MG CAPS Take 1,200 mg by mouth daily.   Yes [provider]  polyethylene glycol (MIRALAX / GLYCOLAX) packet Take 17 g by mouth daily. Mix with 8 oz liquid and drink   Yes [provider]  prednisoLONE acetate (PRED FORTE) 1 % ophthalmic suspension Place 1 drop into the left eye daily. 09/12/20  Yes [provider]  rosuvastatin (CRESTOR) 20 MG tablet Take 20 mg by mouth daily.   Yes [provider]  silodosin (RAPAFLO) 8 MG CAPS capsule Take 8 mg by mouth daily. 12/04/20  Yes [provider]  spironolactone (ALDACTONE) 25  MG tablet Take 1 tablet (25 mg total) by mouth daily. Patient taking differently: Take 12.5 mg by mouth daily. 11/12/20  Yes [name], Peter M, MD  Zinc 50 MG CAPS Take 50 mg by mouth daily with supper.    Yes [provider]  glucose blood (ONETOUCH ULTRA) test strip USE 1 STRIP DAILY. 08/20/20   [provider]  Lancets (ONETOUCH DELICA PLUS [id number]) [place] SMARTSIG:1 Unit(s) Topical Daily 07/01/20   [provider]  [facility] ULTRA test strip daily. 08/20/20   [provider]    Current Facility-Administered Medications  Medication Dose Route Frequency Provider Last Rate Last Admin   acetaminophen (TYLENOL) tablet 650 mg  650 mg Oral Q6H PRN Aslam, Sadia, MD       Or   acetaminophen (TYLENOL) suppository 650 mg  650 mg Rectal Q6H PRN Aslam, Sadia, MD       dorzolamide (TRUSOPT) 2 % ophthalmic solution 1 drop  1 drop Right Eye QHS Aslam,  Loralyn Freshwater, MD       Derrill Memo ON 07/31/2021] finasteride (PROSCAR) tablet 5 mg  5 mg Oral Daily Aslam, Sadia, MD       insulin aspart (novoLOG) injection 0-9 Units  0-9 Units Subcutaneous TID WC Aslam, Loralyn Freshwater, MD       [START ON 07/31/2021] losartan (COZAAR) tablet 100 mg  100 mg Oral Daily Aslam, Sadia, MD       metoprolol tartrate (LOPRESSOR) tablet 12.5 mg  12.5 mg Oral BID Aslam, Loralyn Freshwater, MD       ondansetron (ZOFRAN) tablet 4 mg  4 mg Oral Q6H PRN Aslam, Loralyn Freshwater, MD       Or   ondansetron (ZOFRAN) injection 4 mg  4 mg Intravenous Q6H PRN Aslam, Sadia, MD       prednisoLONE acetate (PRED FORTE) 1 % ophthalmic suspension 1 drop  1 drop Left Eye Daily Aslam, Sadia, MD       rosuvastatin (CRESTOR) tablet 20 mg  20 mg Oral Daily Aslam, Sadia, MD       sodium chloride flush (NS) 0.9 % injection 3 mL  3 mL Intravenous Q12H Harvie Heck, MD       [START ON 07/31/2021] spironolactone (ALDACTONE) tablet 12.5 mg  12.5 mg Oral Daily Aslam, Loralyn Freshwater, MD       [START ON 07/31/2021] tamsulosin (FLOMAX) capsule 0.4 mg  0.4 mg Oral Daily Aslam, Sadia, MD        Current Outpatient Medications  Medication Sig Dispense Refill   acyclovir (ZOVIRAX) 800 MG tablet Take 800 mg by mouth daily.      aspirin EC 81 MG tablet Take 81 mg by mouth daily.     clopidogrel (PLAVIX) 75 MG tablet Take 1 tablet (75 mg total) by mouth daily. 90 tablet 3   dorzolamide (TRUSOPT) 2 % ophthalmic solution Place 1 drop into the right eye at bedtime.     finasteride (PROSCAR) 5 MG tablet Take 1 tablet (5 mg total) by mouth daily.     fluconazole (DIFLUCAN) 100 MG tablet Take 100 mg by mouth daily.     Glucosamine-Chondroitin 250-200 MG TABS Take 2 tablets by mouth 2 (two) times daily.     losartan (COZAAR) 100 MG tablet Take 1 tablet (100 mg total) by mouth daily. 30 tablet 6   metoprolol succinate (TOPROL-XL) 25 MG 24 hr tablet Take 1 tablet (25 mg total) by mouth daily. 90 tablet 3   Multiple Vitamin (MULTIVITAMIN WITH MINERALS) TABS tablet Take 1 tablet by mouth daily. Centrum Silver     Omega-3 Fatty Acids (FISH OIL) 1200 MG CAPS Take 1,200 mg by mouth daily.     polyethylene glycol (MIRALAX / GLYCOLAX) packet Take 17 g by mouth daily. Mix with 8 oz liquid and drink     prednisoLONE acetate (PRED FORTE) 1 % ophthalmic suspension Place 1 drop into the left eye daily.     rosuvastatin (CRESTOR) 20 MG tablet Take 20 mg by mouth daily.     silodosin (RAPAFLO) 8 MG CAPS capsule Take 8 mg by mouth daily.     spironolactone (ALDACTONE) 25 MG tablet Take 1 tablet (25 mg total) by mouth daily. (Patient taking differently: Take 12.5 mg by mouth daily.) 90 tablet 3   Zinc 50 MG CAPS Take 50 mg by mouth daily with supper.      glucose blood (ONETOUCH ULTRA) test strip USE 1 STRIP DAILY.     Lancets (ONETOUCH DELICA PLUS [id number]) MISC SMARTSIG:1 Unit(s) Topical Daily  ONETOUCH ULTRA test strip daily.      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    Employer: RETIRED    Comment: retired  Tobacco Use   Smoking status: Former    Packs/day: 0.50    Years: 50.00    Pack years: 25.00    Types: Cigarettes    Quit date: 02/23/1989    Years since quitting: 32.4   Smokeless tobacco: Never  Vaping Use   Vaping Use: Never used  Substance and Sexual Activity   Alcohol use: No   Drug use: No   Sexual activity: Yes    Birth control/protection: None  Other Topics Concern   Not on file  Social History Narrative   Not on file   Social Determinants of Health   Financial Resource Strain: Not on file  Food Insecurity: Not on file  Transportation Needs: Not on file  Physical Activity: Not on file  Stress: Not on file  Social Connections: Not on file  Intimate Partner Violence: Not on file    Review of Systems: Pertinent positive and negative review of systems were noted in the above HPI section.  All other review of systems was otherwise negative.   Physical Exam: Vital signs in last 24 hours: Temp:  [98 F (36.7 C)] 98 F (36.7 C) (12/30 1051) Pulse Rate:  [28-87] 28 (12/30 1400) Resp:  [14-23] 23 (12/30 1400) BP: (122-133)/(49-89) 133/83 (12/30 1400) SpO2:  [97 %-100 %] 98 % (12/30 1400) Weight:  [83 kg] 83 kg (12/30 1052)   General:   Alert,  Well-developed, well-nourished, pleasant and cooperative in NAD Head:  Normocephalic and atraumatic. Eyes:  Sclera clear, no icterus.   Conjunctiva pink. Ears:  Normal auditory acuity. Nose:  No deformity, discharge,  or lesions. Mouth:  No deformity or lesions.   Neck:   Supple; no masses or thyromegaly. Lungs:  Clear throughout to auscultation.   No wheezes, crackles, or rhonchi.  Heart:  Regular rate and rhythm; no murmurs, clicks, rubs,  or gallops. Abdomen:  Soft,nontender, BS active,nonpalp mass or hsm.   Rectal:  not done Msk:  Symmetrical without gross deformities. . Pulses:  Normal pulses noted. Extremities:  Without clubbing or edema. Neurologic:  Alert and  oriented x4;  grossly normal neurologically. Skin:  Intact without significant lesions or rashes.. Psych:  Alert and cooperative. Normal mood and affect.  Intake/Output from previous day: No intake/output data recorded. Intake/Output this shift: No intake/output data recorded.  Lab Results: Recent Labs    07/30/21 1100  WBC 8.5  HGB 14.4  HCT 42.7  PLT 251   BMET Recent Labs    07/30/21 1100  NA 135  K 4.4  CL 107  CO2 23  GLUCOSE 187*  BUN 16  CREATININE 0.95  CALCIUM 9.2   LFT Recent Labs    07/30/21 1100  PROT 6.4*  ALBUMIN 3.9  AST  19  ALT 25  ALKPHOS 73  BILITOT 1.0    IMPRESSION:  #18 87 year old white male with acute probable lower GI bleed presenting with burgundy appearing stools onset yesterday morning.  Patient has had 4 episodes since onset the last was late this morning-bleeding has been painless and is in the setting of Plavix and aspirin  Hemodynamically stable on admission, and initial hemoglobin normal at 14 Symptoms have previously documented diverticulosis and current bleeding is consistent with that seen with diverticular hemorrhage. Does not appear that he has had a colonoscopy in the past 6 years (unable to find timing of colonoscopy after discussion with [facility]'s afternoon)  #2 chronic antiplatelet therapy with an aspirin and Plavix-last dose of Plavix this morning #3 coronary artery disease status post LAD stent April 2022 4 history of hypertension 5.  Adult onset diabetes mellitus 6.  Chronic kidney disease stage III 7.  Post  bilateral knee replacements 8.  Ischemic cardiomyopathy with EF of 45 to 50%   PLAN: Clear liquid diet  Serial hemoglobins every 6 hours and transfuse as indicated to keep hemoglobin above 8 given coronary artery disease and advanced age  Hold Plavix and aspirin  If patient has further active bleeding within the next 24 hours would proceed to CT angio to look for acute active source, and if CT angio positive proceed to IR for potential embolization.  GI will follow with you  Amy Esterwood PA-C 07/30/2021, 3:24 PM     Attending Physician Note   I have taken a history, reviewed the chart and examined the patient. I personally saw the patient and performed a substantive portion of this encounter, including a complete performance of at least one of the key components, in conjunction with the APP. I agree with the APP's note, impression and recommendations.   Acute painless hematochezia in this patient with a history of diverticulosis and hemorrhoids. He is maintained on Plavix and ASA post LAD stent in April 2022. Suspected diverticular bleed. Hold Plavix and ASA for now. Trend CBC. Clear liquid diet. If active bleeding recurs proceed with CT angio and if positive then to IR.   Lucio Edward, MD [facility] See AM[facility], [facility], for our on call provider

## 2021-07-30 NOTE — ED Notes (Signed)
RN not ready for patient she has not looked over his SBAR

## 2021-07-30 NOTE — Progress Notes (Signed)
Patient to room 4E03 from ED. Vital signs obtained. On monitor CCMD notified. CHG bath completed. Alert and oriented to room and call light. Call bell within reach.  Era Bumpers, RN

## 2021-07-30 NOTE — ED Provider Notes (Signed)
[facility] EMERGENCY DEPARTMENT Provider Note   CSN: [id number] Arrival date & time: 07/30/21  7824     History Chief Complaint  Patient presents with   GI Bleeding    John Gamble is a 87 y.o. male.  HPI  Patient presents to the ER for evaluation of rectal bleeding.  Patient states he started having symptoms yesterday.  He noted dark bloody stools.  Patient states this morning he started having recurrent rectal bleeding.  He has had several bouts of rectal bleeding.  He states he had some mild discomfort in the periumbilical region yesterday but no significant pain and he is not having any pain today.  He has not had any fevers.  He does have history of previous rectal bleeding but he was told that was related to hemorrhoids.  He is not on any anticoagulation.  Past Medical History:  Diagnosis Date   Coronary artery disease    Hyperlipidemia    Hypertension    MI, old    OA (osteoarthritis)    Pneumonia    hx of 2013     Patient Active Problem List   Diagnosis Date Noted   GI bleed 07/30/2021   Urinary retention 11/06/2020   Hematuria 11/06/2020   Unstable angina ([place]) 11/05/2020   Acute on chronic combined systolic and diastolic CHF (congestive heart failure) ([place]) 11/04/2020   Dyspnea on exertion    Type 2 diabetes mellitus without complication, without long-term current use of insulin (HCC) 08/21/2017   Abnormal nuclear stress test 05/06/2015   Primary osteoarthritis of knee 09/19/2014   S/P knee replacement 09/19/2014   S/P total knee arthroplasty 09/19/2014   Lower gastrointestinal bleed 01/30/2012   Coronary artery disease    MI, old    Hypertension    Hyperlipidemia     Past Surgical History:  Procedure Laterality Date   CARDIAC CATHETERIZATION  01/26/1999   EF 55%   CARDIAC CATHETERIZATION N/A 05/06/2015   Procedure: Left Heart Cath and Coronary Angiography;  Surgeon: Peter M [name], MD;  Location: [facility];  Service:  Cardiovascular;  Laterality: N/A;   CARDIOVASCULAR STRESS TEST  02/22/2010   EF 61%   corneal implant     left   CORONARY ATHERECTOMY N/A 11/04/2020   Procedure: CORONARY ATHERECTOMY;  Surgeon: [name], Peter M, MD;  Location: [facility];  Service: Cardiovascular;  Laterality: N/A;   CORONARY STENT INTERVENTION N/A 11/04/2020   Procedure: CORONARY STENT INTERVENTION;  Surgeon: [name], Peter M, MD;  Location: [facility];  Service: Cardiovascular;  Laterality: N/A;   CORONARY STENT PLACEMENT  2000   LEFT CIRCUMFLEX CORONARY   INGUINAL HERNIA REPAIR     INTRAVASCULAR ULTRASOUND/IVUS N/A 11/04/2020   Procedure: Intravascular Ultrasound/IVUS;  Surgeon: [name], Peter M, MD;  Location: [facility];  Service: Cardiovascular;  Laterality: N/A;   REPLACEMENT TOTAL KNEE     RIGHT/LEFT HEART CATH AND CORONARY ANGIOGRAPHY N/A 10/27/2020   Procedure: RIGHT/LEFT HEART CATH AND CORONARY ANGIOGRAPHY;  Surgeon: [name], Peter M, MD;  Location: [facility];  Service: Cardiovascular;  Laterality: N/A;   TOTAL KNEE ARTHROPLASTY Left 09/19/2014   Procedure: LEFT TOTAL KNEE ARTHROPLASTY;  Surgeon: Sydnee Cabal, MD;  Location: [facility];  Service: Orthopedics;  Laterality: Left;       Family History  Problem Relation Age of Onset   Cancer Mother    Cancer Father     Social History   Tobacco Use  Smoking status: Former    Packs/day: 0.50    Years: 50.00    Pack years: 25.00    Types: Cigarettes    Quit date: 02/23/1989    Years since quitting: 32.4   Smokeless tobacco: Never  Vaping Use   Vaping Use: Never used  Substance Use Topics   Alcohol use: No   Drug use: No    Home Medications Prior to Admission medications   Medication Sig Start Date End Date Taking? Authorizing Provider  acyclovir (ZOVIRAX) 800 MG tablet Take 800 mg by mouth daily.  03/03/14  Yes [provider]  aspirin EC 81 MG tablet Take 81 mg by mouth daily.   Yes [provider]   clopidogrel (PLAVIX) 75 MG tablet Take 1 tablet (75 mg total) by mouth daily. 10/27/20 10/27/21 Yes [name], Peter M, MD  dorzolamide (TRUSOPT) 2 % ophthalmic solution Place 1 drop into the right eye at bedtime.   Yes [provider]  finasteride (PROSCAR) 5 MG tablet Take 1 tablet (5 mg total) by mouth daily. 11/20/20  Yes [name], Peter M, MD  fluconazole (DIFLUCAN) 100 MG tablet Take 100 mg by mouth daily. 12/15/20  Yes [provider]  Glucosamine-Chondroitin 250-200 MG TABS Take 2 tablets by mouth 2 (two) times daily.   Yes [provider]  losartan (COZAAR) 100 MG tablet Take 1 tablet (100 mg total) by mouth daily. 01/19/21 07/30/21 Yes [name], Peter M, MD  metoprolol succinate (TOPROL-XL) 25 MG 24 hr tablet Take 1 tablet (25 mg total) by mouth daily. 11/12/20  Yes [name], Peter M, MD  Multiple Vitamin (MULTIVITAMIN WITH MINERALS) TABS tablet Take 1 tablet by mouth daily. Centrum Silver   Yes [provider]  Omega-3 Fatty Acids (FISH OIL) 1200 MG CAPS Take 1,200 mg by mouth daily.   Yes [provider]  polyethylene glycol (MIRALAX / GLYCOLAX) packet Take 17 g by mouth daily. Mix with 8 oz liquid and drink   Yes [provider]  prednisoLONE acetate (PRED FORTE) 1 % ophthalmic suspension Place 1 drop into the left eye daily. 09/12/20  Yes [provider]  rosuvastatin (CRESTOR) 20 MG tablet Take 20 mg by mouth daily.   Yes [provider]  silodosin (RAPAFLO) 8 MG CAPS capsule Take 8 mg by mouth daily. 12/04/20  Yes [provider]  spironolactone (ALDACTONE) 25 MG tablet Take 1 tablet (25 mg total) by mouth daily. Patient taking differently: Take 12.5 mg by mouth daily. 11/12/20  Yes [name], Peter M, MD  Zinc 50 MG CAPS Take 50 mg by mouth daily with supper.    Yes [provider]  glucose blood (ONETOUCH ULTRA) test strip USE 1 STRIP DAILY. 08/20/20   [provider]  Lancets (ONETOUCH DELICA PLUS  [id number]) [place] SMARTSIG:1 Unit(s) Topical Daily 07/01/20   [provider]  [facility] ULTRA test strip daily. 08/20/20   [provider]    Allergies    Atorvastatin, Oysters [shellfish allergy], and Sulfa antibiotics  Review of Systems   Review of Systems  All other systems reviewed and are negative.  Physical Exam Updated Vital Signs BP (!) 116/54 (BP Location: Right Arm)    Pulse 80    Temp 97.7 F (36.5 C) (Axillary)    Resp 14    Ht 1.854 m (6\' 1" )    Wt 81.2 kg    SpO2 96%    BMI 23.62 kg/m   Physical Exam Vitals and nursing note reviewed.  Constitutional:  General: He is not in acute distress.    Appearance: He is well-developed.  HENT:     Head: Normocephalic and atraumatic.     Right Ear: External ear normal.     Left Ear: External ear normal.  Eyes:     General: No scleral icterus.       Right eye: No discharge.        Left eye: No discharge.     Conjunctiva/sclera: Conjunctivae normal.  Neck:     Trachea: No tracheal deviation.  Cardiovascular:     Rate and Rhythm: Normal rate and regular rhythm.  Pulmonary:     Effort: Pulmonary effort is normal. No respiratory distress.     Breath sounds: Normal breath sounds. No stridor. No wheezing or rales.  Abdominal:     General: Bowel sounds are normal. There is no distension.     Palpations: Abdomen is soft.     Tenderness: There is no abdominal tenderness. There is no guarding or rebound.  Genitourinary:    Comments: Gross blood on rectal exam, no external or internal hemorrhoids palpated Musculoskeletal:        General: No tenderness or deformity.     Cervical back: Neck supple.  Skin:    General: Skin is warm and dry.     Findings: No rash.  Neurological:     General: No focal deficit present.     Mental Status: He is alert.     Cranial Nerves: No cranial nerve deficit (no facial droop, extraocular movements intact, no slurred speech).     Sensory: No sensory deficit.     Motor: No  abnormal muscle tone or seizure activity.     Coordination: Coordination normal.  Psychiatric:        Mood and Affect: Mood normal.    ED Results / Procedures / Treatments   Labs (all labs ordered are listed, but only abnormal results are displayed) Labs Reviewed  COMPREHENSIVE METABOLIC PANEL - Abnormal; Notable for the following components:      Result Value   Glucose, Bld 187 (*)    Total Protein 6.4 (*)    All other components within normal limits  CBC - Abnormal; Notable for the following components:   RBC 4.12 (*)    HCT 36.6 (*)    All other components within normal limits  BASIC METABOLIC PANEL - Abnormal; Notable for the following components:   CO2 20 (*)    Glucose, Bld 155 (*)    All other components within normal limits  HEMOGLOBIN A1C - Abnormal; Notable for the following components:   Hgb A1c MFr Bld 6.9 (*)    All other components within normal limits  GLUCOSE, CAPILLARY - Abnormal; Notable for the following components:   Glucose-Capillary 214 (*)    All other components within normal limits  CBC - Abnormal; Notable for the following components:   WBC 12.7 (*)    All other components within normal limits  GLUCOSE, CAPILLARY - Abnormal; Notable for the following components:   Glucose-Capillary 158 (*)    All other components within normal limits  POC OCCULT BLOOD, ED - Abnormal; Notable for the following components:   Fecal Occult Bld POSITIVE (*)    All other components within normal limits  CBG MONITORING, ED - Abnormal; Notable for the following components:   Glucose-Capillary 140 (*)    All other components within normal limits  RESP PANEL BY RT-PCR (FLU A&B, COVID) ARPGX2  CBC  CBC  CBC  CBC  CBC  TYPE AND SCREEN    EKG None  Radiology No results found.  Procedures Procedures   Medications Ordered in ED Medications  sodium chloride flush (NS) 0.9 % injection 3 mL (3 mLs Intravenous Given 07/30/21 2125)  acetaminophen (TYLENOL) tablet  650 mg (has no administration in time range)    Or  acetaminophen (TYLENOL) suppository 650 mg (has no administration in time range)  ondansetron (ZOFRAN) tablet 4 mg (has no administration in time range)    Or  ondansetron (ZOFRAN) injection 4 mg (has no administration in time range)  dorzolamide (TRUSOPT) 2 % ophthalmic solution 1 drop (1 drop Right Eye Given 07/30/21 2125)  finasteride (PROSCAR) tablet 5 mg (5 mg Oral Given 07/31/21 0945)  losartan (COZAAR) tablet 100 mg (100 mg Oral Given 07/31/21 0945)  prednisoLONE acetate (PRED FORTE) 1 % ophthalmic suspension 1 drop (1 drop Left Eye Not Given 07/30/21 1613)  rosuvastatin (CRESTOR) tablet 20 mg (20 mg Oral Given 07/31/21 0945)  tamsulosin (FLOMAX) capsule 0.4 mg (0.4 mg Oral Given 07/31/21 0945)  insulin aspart (novoLOG) injection 0-9 Units (1 Units Subcutaneous Given by Other 07/31/21 0700)  metoprolol tartrate (LOPRESSOR) tablet 12.5 mg (12.5 mg Oral Given 07/31/21 0945)  spironolactone (ALDACTONE) tablet 12.5 mg (12.5 mg Oral Given 07/31/21 0945)    ED Course  I have reviewed the triage vital signs and the nursing notes.  Pertinent labs & imaging results that were available during my care of the patient were reviewed by me and considered in my medical decision making (see chart for details).  Clinical Course as of 07/31/21 1106  Fri Jul 30, 2021  1331 D/w PA Amy Esterwood, GI.  Will consult on patient.  Agrees with admission [JK]    Clinical Course User Index [JK] Dorie Rank, MD   MDM Rules/Calculators/A&P                         Patient presented with acute onset of GI bleeding.  Patient noted to have maroon-colored stool on rectal exam.  No obvious mass or abnormality.  Remained hemodynamically stable in the ED and has a normal hemoglobin however at risk for significant bleeding.  Patient is on Plavix.  Discussed with GI and will admit for observation and further work-up.  Case discussed with medical service for  admission    Final Clinical Impression(s) / ED Diagnoses Final diagnoses:  Lower GI bleed    Rx / DC Orders ED Discharge Orders     None        Dorie Rank, MD 07/31/21 1106

## 2021-07-31 DIAGNOSIS — K922 Gastrointestinal hemorrhage, unspecified: Secondary | ICD-10-CM

## 2021-07-31 DIAGNOSIS — E782 Mixed hyperlipidemia: Secondary | ICD-10-CM | POA: Diagnosis not present

## 2021-07-31 DIAGNOSIS — I130 Hypertensive heart and chronic kidney disease with heart failure and stage 1 through stage 4 chronic kidney disease, or unspecified chronic kidney disease: Secondary | ICD-10-CM | POA: Diagnosis present

## 2021-07-31 DIAGNOSIS — Z79899 Other long term (current) drug therapy: Secondary | ICD-10-CM | POA: Diagnosis not present

## 2021-07-31 DIAGNOSIS — E119 Type 2 diabetes mellitus without complications: Secondary | ICD-10-CM

## 2021-07-31 DIAGNOSIS — I472 Ventricular tachycardia, unspecified: Secondary | ICD-10-CM | POA: Diagnosis not present

## 2021-07-31 DIAGNOSIS — K5791 Diverticulosis of intestine, part unspecified, without perforation or abscess with bleeding: Secondary | ICD-10-CM | POA: Diagnosis not present

## 2021-07-31 DIAGNOSIS — K921 Melena: Secondary | ICD-10-CM | POA: Diagnosis present

## 2021-07-31 DIAGNOSIS — I959 Hypotension, unspecified: Secondary | ICD-10-CM | POA: Diagnosis not present

## 2021-07-31 DIAGNOSIS — E1165 Type 2 diabetes mellitus with hyperglycemia: Secondary | ICD-10-CM | POA: Diagnosis present

## 2021-07-31 DIAGNOSIS — Z20822 Contact with and (suspected) exposure to covid-19: Secondary | ICD-10-CM | POA: Diagnosis present

## 2021-07-31 DIAGNOSIS — I252 Old myocardial infarction: Secondary | ICD-10-CM | POA: Diagnosis not present

## 2021-07-31 DIAGNOSIS — M1990 Unspecified osteoarthritis, unspecified site: Secondary | ICD-10-CM | POA: Diagnosis present

## 2021-07-31 DIAGNOSIS — Z955 Presence of coronary angioplasty implant and graft: Secondary | ICD-10-CM | POA: Diagnosis not present

## 2021-07-31 DIAGNOSIS — N182 Chronic kidney disease, stage 2 (mild): Secondary | ICD-10-CM | POA: Diagnosis present

## 2021-07-31 DIAGNOSIS — I5022 Chronic systolic (congestive) heart failure: Secondary | ICD-10-CM | POA: Diagnosis present

## 2021-07-31 DIAGNOSIS — K2100 Gastro-esophageal reflux disease with esophagitis, without bleeding: Secondary | ICD-10-CM | POA: Diagnosis present

## 2021-07-31 DIAGNOSIS — K2990 Gastroduodenitis, unspecified, without bleeding: Secondary | ICD-10-CM | POA: Diagnosis not present

## 2021-07-31 DIAGNOSIS — Z800 Family history of malignant neoplasm of digestive organs: Secondary | ICD-10-CM | POA: Diagnosis not present

## 2021-07-31 DIAGNOSIS — K5731 Diverticulosis of large intestine without perforation or abscess with bleeding: Secondary | ICD-10-CM | POA: Diagnosis present

## 2021-07-31 DIAGNOSIS — Z7982 Long term (current) use of aspirin: Secondary | ICD-10-CM | POA: Diagnosis not present

## 2021-07-31 DIAGNOSIS — I493 Ventricular premature depolarization: Secondary | ICD-10-CM | POA: Diagnosis not present

## 2021-07-31 DIAGNOSIS — K2991 Gastroduodenitis, unspecified, with bleeding: Secondary | ICD-10-CM | POA: Diagnosis present

## 2021-07-31 DIAGNOSIS — Z7902 Long term (current) use of antithrombotics/antiplatelets: Secondary | ICD-10-CM | POA: Diagnosis not present

## 2021-07-31 DIAGNOSIS — I5042 Chronic combined systolic (congestive) and diastolic (congestive) heart failure: Secondary | ICD-10-CM | POA: Diagnosis not present

## 2021-07-31 DIAGNOSIS — I2510 Atherosclerotic heart disease of native coronary artery without angina pectoris: Secondary | ICD-10-CM | POA: Diagnosis present

## 2021-07-31 DIAGNOSIS — I255 Ischemic cardiomyopathy: Secondary | ICD-10-CM | POA: Diagnosis present

## 2021-07-31 DIAGNOSIS — E781 Pure hyperglyceridemia: Secondary | ICD-10-CM | POA: Diagnosis present

## 2021-07-31 DIAGNOSIS — D62 Acute posthemorrhagic anemia: Secondary | ICD-10-CM | POA: Diagnosis not present

## 2021-07-31 DIAGNOSIS — C4490 Unspecified malignant neoplasm of skin, unspecified: Secondary | ICD-10-CM | POA: Diagnosis present

## 2021-07-31 DIAGNOSIS — E1122 Type 2 diabetes mellitus with diabetic chronic kidney disease: Secondary | ICD-10-CM | POA: Diagnosis present

## 2021-07-31 DIAGNOSIS — I10 Essential (primary) hypertension: Secondary | ICD-10-CM | POA: Diagnosis not present

## 2021-07-31 LAB — BASIC METABOLIC PANEL
Anion gap: 10 (ref 5–15)
BUN: 13 mg/dL (ref 8–23)
CO2: 20 mmol/L — ABNORMAL LOW (ref 22–32)
Calcium: 9 mg/dL (ref 8.9–10.3)
Chloride: 107 mmol/L (ref 98–111)
Creatinine, Ser: 0.87 mg/dL (ref 0.61–1.24)
GFR, Estimated: >60 mL/min (ref >60)
Glucose, Bld: 155 mg/dL — ABNORMAL HIGH (ref 70–99)
Potassium: 3.9 mmol/L (ref 3.5–5.1)
Sodium: 137 mmol/L (ref 135–145)

## 2021-07-31 LAB — CBC
HCT: 39 % (ref 39.0–52.0)
HCT: 41.9 % (ref 39.0–52.0)
HCT: 38.8 % — ABNORMAL LOW (ref 39.0–52.0)
Hemoglobin: 13.9 g/dL (ref 13.0–17.0)
Hemoglobin: 14.7 g/dL (ref 13.0–17.0)
Hemoglobin: 13.4 g/dL (ref 13.0–17.0)
MCH: 31.7 pg (ref 26.0–34.0)
MCH: 31.5 pg (ref 26.0–34.0)
MCH: 30.9 pg (ref 26.0–34.0)
MCHC: 35.6 g/dL (ref 30.0–36.0)
MCHC: 35.1 g/dL (ref 30.0–36.0)
MCHC: 34.5 g/dL (ref 30.0–36.0)
MCV: 88.8 fL (ref 80.0–100.0)
MCV: 89.9 fL (ref 80.0–100.0)
MCV: 89.4 fL (ref 80.0–100.0)
Platelets: 236 10*3/uL (ref 150–400)
Platelets: 284 10*3/uL (ref 150–400)
Platelets: 306 10*3/uL (ref 150–400)
RBC: 4.39 MIL/uL (ref 4.22–5.81)
RBC: 4.66 MIL/uL (ref 4.22–5.81)
RBC: 4.34 MIL/uL (ref 4.22–5.81)
RDW: 13.2 % (ref 11.5–15.5)
RDW: 13.2 % (ref 11.5–15.5)
RDW: 13.1 % (ref 11.5–15.5)
WBC: 8.4 10*3/uL (ref 4.0–10.5)
WBC: 12.7 10*3/uL — ABNORMAL HIGH (ref 4.0–10.5)
WBC: 11.7 10*3/uL — ABNORMAL HIGH (ref 4.0–10.5)
nRBC: 0 % (ref 0.0–0.2)
nRBC: 0 % (ref 0.0–0.2)
nRBC: 0 % (ref 0.0–0.2)

## 2021-07-31 LAB — HEMOGLOBIN A1C
Hgb A1c MFr Bld: 6.9 % — ABNORMAL HIGH (ref 4.8–5.6)
Mean Plasma Glucose: 151.33 mg/dL

## 2021-07-31 LAB — GLUCOSE, CAPILLARY
Glucose-Capillary: 158 mg/dL — ABNORMAL HIGH (ref 70–99)
Glucose-Capillary: 270 mg/dL — ABNORMAL HIGH (ref 70–99)
Glucose-Capillary: 184 mg/dL — ABNORMAL HIGH (ref 70–99)
Glucose-Capillary: 208 mg/dL — ABNORMAL HIGH (ref 70–99)

## 2021-07-31 MED ORDER — LACTATED RINGERS IV BOLUS
500.0000 mL | Freq: Once | INTRAVENOUS | Status: AC
  Administered 2021-07-31: 500 mL via INTRAVENOUS

## 2021-07-31 NOTE — Progress Notes (Signed)
HD#0 SUBJECTIVE:  Patient Summary: John Gamble is a 87 y.o. male with a pertinent PMH of hypertension, type II diabetes mellitus, hyperlipidemia, coronary artery disease s/p left circumflex PCI in 2000 and PCI w/ atherectomy of ostial LAD with DES in 12/1948, chronic systolic heart failure 2/2 ischemic cardiomyopathy (EF 45-50%), osteoarthritis s/p bilateral knee replacement, hx of suspected diverticular bleed in 2013, hemorrhoids s/p banding, multiple hernia repair surgeries who presents to [facility] with rectal bleeding. and admitted for GI bleed.   Overnight Events: Patient had episode of bloody stool overnight.  Interim History: Patient evaluated at bedside. He reports two further bloody bowel movements this morning. He reports feeling weak but it is comparable to his baseline. Denies any pain. Has been able to ambulate without issues, a little dizzy.   OBJECTIVE:  Vital Signs: Vitals:   07/30/21 2001 07/30/21 2317 07/31/21 0318 07/31/21 0700  BP: 133/64 (!) 152/61 (!) 141/108   Pulse: 69 62 75   Resp: 20 17 20    Temp: 97.9 F (36.6 C) 97.7 F (36.5 C) 97.7 F (36.5 C)   TempSrc: Oral Oral Oral   SpO2: 98% 97% 96%   Weight:    81.2 kg  Height:       Supplemental O2: Room Air SpO2: 96 %  Filed Weights   07/30/21 1052 07/31/21 0700  Weight: 83 kg 81.2 kg     Intake/Output Summary (Last 24 hours) at 07/31/2021 0801 Last data filed at 07/31/2021 0700 Gross per 24 hour  Intake 0 ml  Output 1000 ml  Net -1000 ml   Net IO Since Admission: -1,000 mL [07/31/21 0801]  Physical Exam: Constitutional: Chronically ill elderly gentleman in no acute distress. HENT: Oral mucosa is moist and pink. Eyes: Mucosa is moist and pink. Cardio: Regular rate and rhythm. No murmurs, rubs, gallops. Pulm: Clear to auscultation bilaterally. Abdomen: Soft, nontender, nondistended. MSK: Negative for extremity edema. Skin: Skin is warm and dry. Neuro: Alert and oriented x3.  No focal deficit  noted. Psych: Normal mood and affect.  Patient Lines/Drains/Airways Status     Active Line/Drains/Airways     Name Placement date Placement time Site Days   Peripheral IV 07/30/21 20 G Anterior;Left Forearm 07/30/21  1243  Forearm  1   Incision (Closed) 09/19/14 Knee Left 09/19/14  1512  -- 2507             ASSESSMENT/PLAN:  Assessment: Principal Problem:   GI bleed Active Problems:   Coronary artery disease   Hypertension   Hyperlipidemia   Type 2 diabetes mellitus without complication, without long-term current use of insulin (HCC)   Acute on chronic combined systolic and diastolic CHF (congestive heart failure) ([place])   Plan: #GI bleed Patient presented with 2 days of bright red blood per rectum that is painless and not associated with abdominal pain.  Historically he has been noted to have hemorrhoids and diverticulosis during previous episodes of GI bleed.  FOBT was positive on admission.  Overnight he did have an episode of bloody bowel movement, described as not bright red but not very dark.  He remains hemodynamically stable with appropriate hemoglobin levels. -GI consulted, appreciate their assistance  -Recommend obtaining CT angiogram of abdomen/pelvis, which has been ordered.  If there is an active bleed, he may benefit from IR embolization. -Transfuse if Hgb less than 7 -Trend CBC   #Hypertension Blood pressure has been quite labile though normotensive on exam.  Managed with home losartan 100 mg daily. -Continue  home losartan 100 mg daily.   #Hyperlipidemia Most recent lipid panel on 12/27 remarkable only for elevated triglycerides at 257.  Patient is on rosuvastatin 20 mg daily at home. -Continue rosuvastatin 20 mg daily   #History of urinary retention Currently managed with Flomax 0.4 mg daily, finasteride 5 mg daily. -Continue home Flomax 0.5 mg daily and finasteride 5 mg daily   #Systolic heart failure Managed outpatient with metoprolol 25 mg daily,  spironolactone 25 mg daily.  Most recent echocardiogram in June 2022 showed LVEF of 45 to 50% with mildly decreased function and regional wall motion abnormalities.  Is also noted to have severe akinesis of the LV basal inferior wall.  RV function and size is normal; normal pulmonary artery systolic pressure; mildly dilated right atrium; dilation of the ascending aorta.  On exam patient is clinically euvolemic. -Metoprolol 12.5 mg twice daily -Spironolactone 12.5 mg twice daily   #History of coronary artery disease Patient has had multiple stents placed, most recently in April 2022.  He has been on Plavix and aspirin since this procedure. -Hold Plavix and aspirin at this time given GI bleed   #Type 2 diabetes mellitus, insulin independent Most recent HbA1c on 12/27 of 7.4%.  The patient states that he does not medically manage his diabetes, just that he and his primary care physician monitor it. -SSI 3 times daily with meals    Best Practice: Diet: Clear liquid diet, advance as tolerated IVF: Fluids: IV push only, no IV fluids VTE: SCDs Start: 07/30/21 1411 Code: Full AB: None DISPO: Anticipated discharge to Home pending Medical stability.  Signature: Farrel Gordon, D.O.  Internal Medicine Resident, PGY-1 [facility]  Pager: [phone] 8:01 AM, 07/31/2021   Please contact the on call pager after 5 pm and on weekends at [phone].

## 2021-07-31 NOTE — Progress Notes (Signed)
Bowel movement :  Medium BM in toilet.  Notable dark blood, red blood and 2 small clots.  Patient visibly unsteady with c/o being very dizzy and nausea.  No vomitus noted.  Vital sign sequence initiated.

## 2021-07-31 NOTE — Evaluation (Signed)
Physical Therapy Evaluation Patient Details Name: John Gamble MRN: [id number] DOB: Sep 23, 1930 Today's Date: 07/31/2021  History of Present Illness  John Gamble is a 87 y.o. male, who presented to the ED 12/30 after multiple episodes of bloody stool;  probable lower GI bleed. PMHx: CAD, HLD, HTN, OA, MI   Clinical Impression  Pt in bed upon arrival of PT, agreeable to evaluation at this time. Prior to admission the pt was independent with all mobility without need for AD, and reports independence with ADLs and IADLs. He lives in a home with 1 step to enter with his wife who can assist as needed after d/c. The pt now presents with limitations in functional mobility, dynamic stability, and activity tolerance due to above dx, and will continue to benefit from skilled PT to address these deficits. He benefits from [facility] for hand placement but required only minA to minG to complete sit-stand transfers with RW. He also completed hallway ambulation with both RW and no UE support, but demos improved stability with UE support at this time. Will continue to benefit from skilled PT to progress OOB mobility, endurance, and stability both acutely and at OPPT follow-up.   BP prior to session: 114/92 (101) BP at end of session: 102/69 (81)        Recommendations for follow up therapy are one component of a multi-disciplinary discharge planning process, led by the attending physician.  Recommendations may be updated based on patient status, additional functional criteria and insurance authorization.  Follow Up Recommendations Outpatient PT    Assistance Recommended at Discharge Intermittent Supervision/Assistance  Functional Status Assessment Patient has had a recent decline in their functional status and demonstrates the ability to make significant improvements in function in a reasonable and predictable amount of time.  Equipment Recommendations  None recommended by PT    Recommendations for Other Services        Precautions / Restrictions Precautions Precautions: Fall Restrictions Weight Bearing Restrictions: No      Mobility  Bed Mobility Overal bed mobility: Needs Assistance Bed Mobility: Supine to Sit     Supine to sit: Min assist          Transfers Overall transfer level: Needs assistance Equipment used: Rolling walker (2 wheels) Transfers: Sit to/from Stand Sit to Stand: Min guard           General transfer comment: cues for hand placement, no assist given    Ambulation/Gait Ambulation/Gait assistance: Min guard;Min assist Gait Distance (Feet): 75 Feet Assistive device: Rolling walker (2 wheels);None Gait Pattern/deviations: Step-through pattern;Decreased stride length Gait velocity: 0.35 m/s Gait velocity interpretation: <1.31 ft/sec, indicative of household ambulator   General Gait Details: pt with significant trunk flexion with RW dspite cues for posture, able to ambulate without RW but demos slowed posture with increased sway and instability    Balance Overall balance assessment: Needs assistance Sitting-balance support: Feet supported Sitting balance-Leahy Scale: Fair     Standing balance support: Single extremity supported;During functional activity Standing balance-Leahy Scale: Fair Standing balance comment: able to ambulate with increased sway and instability without UE support, improved stability with BUE support                             Pertinent Vitals/Pain Pain Assessment: Faces Faces Pain Scale: Hurts a little bit Pain Location: generalized wtih movement Pain Descriptors / Indicators: Grimacing Pain Intervention(s): Monitored during session    Home Living Family/patient expects  to be discharged to:: Private residence Living Arrangements: Spouse/significant other Available Help at Discharge: Family;Available 24 hours/day Type of Home: House Home Access: Stairs to enter Entrance Stairs-Rails: Right (18" grab bar on R  side) Entrance Stairs-Number of Steps: 1   Home Layout: One level Home Equipment: Conservation officer, nature (2 wheels);Cane - single point      Prior Function Prior Level of Function : Independent/Modified Independent;Driving             Mobility Comments: no AD ADLs Comments: indep, drives     Hand Dominance   Dominant Hand: Right    Extremity/Trunk Assessment   Upper Extremity Assessment Upper Extremity Assessment: Defer to OT evaluation    Lower Extremity Assessment Lower Extremity Assessment: Generalized weakness (functional for transfers, no difference in sensation.)    Cervical / Trunk Assessment Cervical / Trunk Assessment: Normal  Communication   Communication: HOH  Cognition Arousal/Alertness: Awake/alert Behavior During Therapy: WFL for tasks assessed/performed Overall Cognitive Status: Within Functional Limits for tasks assessed                                          General Comments General comments (skin integrity, edema, etc.): BP 114/92 (101) sitting EOB prior to session, 102/69 (81) after session        Assessment/Plan    PT Assessment Patient needs continued PT services  PT Problem List Decreased strength;Decreased range of motion;Decreased activity tolerance;Decreased balance;Decreased mobility       PT Treatment Interventions DME instruction;Gait training;Stair training;Functional mobility training;Balance training;Therapeutic exercise;Therapeutic activities;Patient/family education    PT Goals (Current goals can be found in the Care Plan section)  Acute Rehab PT Goals Patient Stated Goal: return to full activity PT Goal Formulation: With patient Time For Goal Achievement: 08/14/21 Potential to Achieve Goals: Good    Frequency Min 3X/week    AM-PAC PT "6 Clicks" Mobility  Outcome Measure Help needed turning from your back to your side while in a flat bed without using bedrails?: None Help needed moving from lying on your  back to sitting on the side of a flat bed without using bedrails?: None Help needed moving to and from a bed to a chair (including a wheelchair)?: A Little Help needed standing up from a chair using your arms (e.g., wheelchair or bedside chair)?: A Little Help needed to walk in hospital room?: A Little Help needed climbing 3-5 steps with a railing? : A Little 6 Click Score: 20    End of Session Equipment Utilized During Treatment: Gait belt Activity Tolerance: Patient tolerated treatment well Patient left: in chair;with call bell/phone within reach Nurse Communication: Mobility status PT Visit Diagnosis: Other abnormalities of gait and mobility (R26.89);Unsteadiness on feet (R26.81);History of falling (Z91.81)    Time: 1005-1031 PT Time Calculation (min) (ACUTE ONLY): 26 min   Charges:   PT Evaluation $PT Eval Low Complexity: 1 Low PT Treatments $Gait Training: 8-22 mins        West Carbo, PT, DPT   Acute Rehabilitation Department Pager #: [phone]  Sandra Cockayne 07/31/2021, 10:41 AM

## 2021-07-31 NOTE — Progress Notes (Signed)
Mobility Specialist: Progress Note   07/31/21 1656  Mobility  Activity Ambulated in hall  Level of Assistance Minimal assist, patient does 75% or more  Assistive Device Front wheel walker  Distance Ambulated (ft) 470 ft [phone]  Mobility Ambulated with assistance in hallway  Mobility Response Tolerated fair  Mobility performed by Mobility specialist  $Mobility charge 1 Mobility   Pre-Mobility:     Supine: 94 HR, 119/62 BP, 97% SpO2    Sitting EOB: 114/67 BP During Mobility: 128 HR, 99% SpO2 Post-Mobility: 110 HR, 100/41 BP  Pt required minA to sit EOB as well as to stand. I was unable to get standing BP to complete orthostatics. Pt stopped x2 for seated breaks d/t feeling SOB, otherwise no other c/o. Pt sitting EOB after walk with call bell at his side. RN present in the room.   [facility] Liana Camerer Mobility Specialist Mobility Specialist 4 [place]: [phone] Mobility Specialist 2 [place] and [place]: [phone]

## 2021-07-31 NOTE — Discharge Summary (Incomplete)
Name: John Gamble MRN: [id number] DOB: 1931-04-28 87 y.o. PCP: Algis Greenhouse, MD  Date of Admission: 07/30/2021 10:44 AM Date of Discharge:  *** Attending Physician: Dr. {[id number]::"[id number]","Z. Hoffman","Mullen","Narendra","Raines","Vincent","Guilloud"}  DISCHARGE DIAGNOSIS:  Primary Problem: GI bleed   Hospital Problems: Principal Problem:   GI bleed Active Problems:   Coronary artery disease   Hypertension   Hyperlipidemia   Type 2 diabetes mellitus without complication, without long-term current use of insulin (HCC)   Acute on chronic combined systolic and diastolic CHF (congestive heart failure) (HCC)    DISCHARGE MEDICATIONS:   Allergies as of 07/31/2021       Reactions   Atorvastatin Other (See Comments)   myalgias   Oysters [shellfish Allergy] Nausea Only   Sulfa Antibiotics Nausea And Vomiting     Med Rec must be completed prior to using this [place]***       DISPOSITION AND FOLLOW-UP:  John Gamble was discharged from [facility] in Stable condition. At the hospital follow up visit please address:  GI bleed Please follow-up on resolution of GI bleed clinically and with CBC.  He is overdue for colonoscopy as well.    History of coronary artery disease Plavix and aspirin were held during this admission given GI bleed.  Please ensure he is taking these as prescribed on discharge.   Follow-up Recommendations: Consults: Gastroenterology Labs: CBC Studies: Patient will need to be scheduled for colonoscopy that is overdue. Medications: No changes made.  You may restart your Plavix and aspirin now.  Follow-up Appointments:  With your primary care physician within the next 1 to 2 weeks, and with your gastroenterologist in the next month.  HOSPITAL COURSE:  Patient Summary: #GI bleed Patient presented with chief complaint of 2 days of bright red blood per rectum that was painless and not associated with abdominal pain.   Admission labs were significant for positive FOBT however otherwise within normal limits and he was hemodynamically stable.  He did experience several additional episodes of bloody bowel movements overnight, however was persistently hemodynamically stable without decreases in hemoglobin.  CT angiogram abdomen/pelvis was obtained and showed ***.   #Hypertension Patient was continued on home losartan 100 mg daily.   #Hyperlipidemia Patient was continued on home rosuvastatin 20 mg daily.   #History of urinary retention Patient was continued on home Flomax 0.4 mg daily, finasteride 5 mg daily.   #Systolic heart failure On admission, patient was euvolemic with no signs of heart failure exacerbation.  He was continued on home doses of metoprolol 25 mg daily and spironolactone 25 mg daily.   #History of coronary artery disease Patient is on Plavix and aspirin for recent stent placement.  These were held during admission secondary to acute GI bleed.   #Type 2 diabetes mellitus, insulin independent HbA1c noted to be 6.9%.  He was managed with sliding scale insulin 3 times daily with meals.   DISCHARGE INSTRUCTIONS:    SUBJECTIVE:  Patient evaluated at bedside on day of discharge.  He reports feeling well given the circumstances and was without complaint. Discharge Vitals:   BP (!) 116/54 (BP Location: Right Arm)    Pulse 80    Temp 97.7 F (36.5 C) (Axillary)    Resp 14    Ht 6\' 1"  (1.854 m)    Wt 81.2 kg    SpO2 96%    BMI 23.62 kg/m   OBJECTIVE:  Constitutional: Chronically ill elderly gentleman in no acute distress. HENT: Oral mucosa is  moist and pink. Eyes: Mucosa is moist and pink. Cardio: Regular rate and rhythm. No murmurs, rubs, gallops. Pulm: Clear to auscultation bilaterally. Abdomen: Soft, nontender, nondistended. MSK: Negative for extremity edema. Skin: Skin is warm and dry. Neuro: Alert and oriented x3.  No focal deficit noted. Psych: Normal mood and affect.  Pertinent  Labs, Studies, and Procedures:  CBC Latest Ref Rng & Units 07/31/2021 07/31/2021 07/30/2021  WBC 4.0 - 10.5 K/uL 12.7(H) 8.4 7.7  Hemoglobin 13.0 - 17.0 g/dL 14.7 13.9 13.1  Hematocrit 39.0 - 52.0 % 41.9 39.0 36.6(L)  Platelets 150 - 400 K/uL 284 236 242    CMP Latest Ref Rng & Units 07/31/2021 07/30/2021 11/12/2020  Glucose 70 - 99 mg/dL 155(H) 187(H) 180(H)  BUN 8 - 23 mg/dL 13 16 13   Creatinine 0.61 - 1.24 mg/dL 0.87 0.95 0.85  Sodium 135 - 145 mmol/L 137 135 140  Potassium 3.5 - 5.1 mmol/L 3.9 4.4 4.3  Chloride 98 - 111 mmol/L 107 107 103  CO2 22 - 32 mmol/L 20(L) 23 23  Calcium 8.9 - 10.3 mg/dL 9.0 9.2 9.1  Total Protein 6.5 - 8.1 g/dL - 6.4(L) -  Total Bilirubin 0.3 - 1.2 mg/dL - 1.0 -  Alkaline Phos 38 - 126 U/L - 73 -  AST 15 - 41 U/L - 19 -  ALT 0 - 44 U/L - 25 -    No results found.   Signed: Farrel Gordon, D.O.  Internal Medicine Resident, PGY-1 [facility]  Pager: [phone] 11:21 AM, 07/31/2021

## 2021-07-31 NOTE — Progress Notes (Addendum)
Patient ID: John Gamble, male   DOB: [birth year], 87 y.o.   MRN: [id number]    Progress Note   Subjective  Day # 2 CC: acute lower GI bleed  Aspirin and Plavix on hold (last dose yesterday)  Labs today hemoglobin 14.7/hematocrit 41.9 BUN 13/creatinine 0.87  Patient says he has continued to pass maroonish appearing stool, had gone for multiple hours overnight and then had maroonish bowel movement this morning.  He feels okay, no complaints of abdominal pain or discomfort, no nausea.  He is hungry. He is asking about when he would be going down for his x-rays-there appears that a CT angio of the abdomen pelvis has been ordered.   Objective   Vital signs in last 24 hours: Temp:  [97.7 F (36.5 C)-97.9 F (36.6 C)] 97.8 F (36.6 C) (12/31 1135) Pulse Rate:  [28-88] 47 (12/31 1300) Resp:  [14-23] 16 (12/31 1300) BP: (100-165)/(49-108) 118/51 (12/31 1300) SpO2:  [94 %-98 %] 97 % (12/31 1300) Weight:  [81.2 kg] 81.2 kg (12/31 0700) Last BM Date: 07/31/21 General:    Elderly white male in NAD Heart:  Regular rate and rhythm; no murmurs Lungs: Respirations even and unlabored, lungs CTA bilaterally Abdomen:  Soft, nontender and nondistended. Normal bowel sounds. Extremities:  Without edema. Neurologic:  Alert and oriented,  grossly normal neurologically. Psych:  Cooperative. Normal mood and affect.  Intake/Output from previous day: 12/30 0701 - 12/31 0700 In: 0  Out: 1000 [Urine:1000] Intake/Output this shift: No intake/output data recorded.  Lab Results: Recent Labs    07/30/21 2231 07/31/21 0321 07/31/21 0929  WBC 7.7 8.4 12.7*  HGB 13.1 13.9 14.7  HCT 36.6* 39.0 41.9  PLT 242 236 284   BMET Recent Labs    07/30/21 1100 07/31/21 0321  NA 135 137  K 4.4 3.9  CL 107 107  CO2 23 20*  GLUCOSE 187* 155*  BUN 16 13  CREATININE 0.95 0.87  CALCIUM 9.2 9.0   LFT Recent Labs    07/30/21 1100  PROT 6.4*  ALBUMIN 3.9  AST 19  ALT 25  ALKPHOS 73  BILITOT 1.0       Assessment / Plan:    #34 87 year old white male admitted with acute lower GI bleeding with burgundy appearing stool in setting of chronic aspirin and Plavix. Patient has previously documented diverticulosis, and bleeding is consistent with diverticular bleed.  Hemoglobin remains normal  Patient has not had colonoscopy for at least 6 years  #2 Chronic antiplatelet therapy with aspirin and Plavix-last dose of Plavix yesterday morning  #3  Coronary artery disease status post LAD stent April 2022 #4  Hypertension #5  Adult onset diabetes mellitus #6  Chronic kidney disease stage III #7  Ischemic cardiomyopathy with EF 45 to 50%  Recommendations:  Advance to full liquid diet I do not think he is bleeding actively enough for CT angio to be of high yield, will cancel Continue to hold aspirin and Plavix Discussed possible colonoscopy with the patient, would let Plavix washout for at least 4-5 days prior to proceeding with colonoscopy.  He would rather have colonoscopy here in the hospital if this needs to be done, and does not want to be discharged until we know that he is not having any further bleeding. Continue to follow hemoglobin At this point would plan for colonoscopy Tuesday with prep on Monday    LOS: 0 days   Amy Esterwood PA-C 07/31/2021, 1:29 PM    Attending Physician Note  I have taken an interval history, reviewed the chart and examined the patient. I personally saw the patient and performed a substantive portion of this encounter, including a complete performance of at least one of the key components, in conjunction with the APP. I agree with the APP's note, impression and recommendations.   Painless, maroon stool overnight and this morning which has now stopped. Likely a diverticular bleed. Hgb stable at 14.7. He doesn't appear to be bleeding at this time so CT angio would be low yield. Continue to monitor bleeding and CBC. Continue to hold Plavix and ASA.  Tentatively plan for colonoscopy on Tuesday. If brisk bleeding recurs a prompt CT angio would be indicated.   Lucio Edward, MD [facility] [employer] See [facility], [facility], for our on call provider

## 2021-07-31 NOTE — Evaluation (Signed)
Occupational Therapy Evaluation Patient Details Name: John Gamble MRN: [id number] DOB: September 06, 1930 Today's Date: 07/31/2021   History of Present Illness John Gamble is a 87 y.o. male, who presented to the ED earlier today after multiple episodes of bloody stool;  probable lower GI bleed. PMHx: CAD, HLD, HTN, OA, MI   Clinical Impression   John Gamble was indep PTA, no use of AD and drives. He lives in a 1 level home, 1 STE with his wife who can assist as needed at d/c. Upon evaluation pt required min A for all transfers, close min guard for short ambulation with RW and up to min A for LB ADLs. Pt was limited by generalized weakness, poor activity tolerance and reports of dizziness. VSS on RA, negative orthostatics. Pt will benefit from OT acutely to progress towards his indep baseline. Recommend d/c home with HHOT to progress rehab in the home environment.   Supine BP: 99/59 (72) Sitting BP: 116/56 (69) Standing BP: 121/60 (80)     Recommendations for follow up therapy are one component of a multi-disciplinary discharge planning process, led by the attending physician.  Recommendations may be updated based on patient status, additional functional criteria and insurance authorization.   Follow Up Recommendations  Outpatient OT    Assistance Recommended at Discharge Intermittent Supervision/Assistance  Functional Status Assessment  Patient has had a recent decline in their functional status and demonstrates the ability to make significant improvements in function in a reasonable and predictable amount of time.  Equipment Recommendations  BSC/3in1       Precautions / Restrictions Precautions Precautions: Fall Restrictions Weight Bearing Restrictions: No      Mobility Bed Mobility Overal bed mobility: Needs Assistance Bed Mobility: Supine to Sit     Supine to sit: Min assist          Transfers Overall transfer level: Needs assistance Equipment used: Rolling walker (2  wheels) Transfers: Sit to/from Stand Sit to Stand: Min assist                  Balance Overall balance assessment: Needs assistance Sitting-balance support: Feet supported Sitting balance-Leahy Scale: Fair     Standing balance support: Single extremity supported;During functional activity Standing balance-Leahy Scale: Fair                             ADL either performed or assessed with clinical judgement   ADL Overall ADL's : Needs assistance/impaired Eating/Feeding: Independent;Sitting Eating/Feeding Details (indicate cue type and reason): thins only Grooming: Min guard;Standing   Upper Body Bathing: Set up;Sitting   Lower Body Bathing: Minimal assistance;Sit to/from stand   Upper Body Dressing : Set up;Sitting   Lower Body Dressing: Minimal assistance;Sit to/from stand   Toilet Transfer: Minimal assistance;Rolling walker (2 wheels);Ambulation   Toileting- Clothing Manipulation and Hygiene: Supervision/safety;Sitting/lateral lean       Functional mobility during ADLs: Minimal assistance;Rolling walker (2 wheels) General ADL Comments: min A to boost from all surfaces, once standing close min guard. pt with report of mild dizziness with change in position. VSS on RA     Vision Baseline Vision/History: 1 Wears glasses Ability to See in Adequate Light: 0 Adequate Vision Assessment?: No apparent visual deficits     Perception     Praxis      Pertinent Vitals/Pain Pain Assessment: Faces Faces Pain Scale: Hurts a little bit Pain Location: generalized wtih movement Pain Descriptors / Indicators: Grimacing Pain Intervention(s):  Monitored during session     Hand Dominance Right   Extremity/Trunk Assessment Upper Extremity Assessment Upper Extremity Assessment: Generalized weakness   Lower Extremity Assessment Lower Extremity Assessment: Defer to PT evaluation   Cervical / Trunk Assessment Cervical / Trunk Assessment: Normal    Communication Communication Communication: HOH   Cognition Arousal/Alertness: Awake/alert Behavior During Therapy: WFL for tasks assessed/performed Overall Cognitive Status: Within Functional Limits for tasks assessed                                       General Comments  VSS on RA    Exercises     Shoulder Instructions      Home Living Family/patient expects to be discharged to:: Private residence Living Arrangements: Spouse/significant other Available Help at Discharge: Family;Available 24 hours/day Type of Home: House Home Access: Stairs to enter [employer] of Steps: 1   Home Layout: One level     Bathroom Shower/Tub: Teacher, early years/pre: Handicapped height     Home Equipment: Conservation officer, nature (2 wheels);Cane - single point          Prior Functioning/Environment Prior Level of Function : Independent/Modified Independent;Driving             Mobility Comments: no AD ADLs Comments: indep, drives        OT Problem List: Decreased activity tolerance;Impaired balance (sitting and/or standing);Decreased safety awareness;Decreased knowledge of precautions;Pain      OT Treatment/Interventions: Self-care/ADL training;Therapeutic exercise;Therapeutic activities;Patient/family education;Balance training    OT Goals(Current goals can be found in the care plan section) Acute Rehab OT Goals Patient Stated Goal: feel better OT Goal Formulation: With patient Time For Goal Achievement: 08/14/21 Potential to Achieve Goals: Good ADL Goals Pt Will Perform Grooming: Independently;standing Pt Will Perform Lower Body Bathing: with modified independence;sit to/from stand Pt Will Perform Lower Body Dressing: with modified independence;sit to/from stand Pt Will Transfer to Toilet: with modified independence;ambulating  OT Frequency: Min 2X/week   Barriers to D/C:            Co-evaluation              AM-PAC OT "6  Clicks" Daily Activity     Outcome Measure Help from another person eating meals?: None Help from another person taking care of personal grooming?: A Little Help from another person toileting, which includes using toliet, bedpan, or urinal?: A Little Help from another person bathing (including washing, rinsing, drying)?: A Little Help from another person to put on and taking off regular upper body clothing?: None Help from another person to put on and taking off regular lower body clothing?: A Little 6 Click Score: 20   End of Session Equipment Utilized During Treatment: Gait belt;Rolling walker (2 wheels) Nurse Communication: Mobility status (pt needs to use the restroom, Lab RN with pt at the end of the session)  Activity Tolerance: Patient tolerated treatment well Patient left: in bed;with call bell/phone within reach;with nursing/sitter in room  OT Visit Diagnosis: Unsteadiness on feet (R26.81);Muscle weakness (generalized) (M62.81);Pain                Time: 2500-3704 OT Time Calculation (min): 22 min Charges:  OT General Charges $OT Visit: 1 Visit OT Evaluation $OT Eval Moderate Complexity: 1 Mod   John Gamble 07/31/2021, 9:45 AM

## 2021-07-31 NOTE — Progress Notes (Signed)
°  Date: 07/31/2021  Patient name: John Gamble  Medical record number: [id number]  Date of birth: 20-Nov-1930   I have seen and evaluated John Gamble and discussed their care with the Residency Team. Briefly, John Gamble presented with bloody bowel movements, concerning for GI bleeding.  He has known history of diverticulae and hemorrhoids.  The bleeding is painless.  He has had a stable H/H.  His last colonoscopy was in 0865 and he is certainly due for another.  However, he has significant CAD with HFrEF and ischemic cardiomyopathy.  He had most recent LAD stent in April of this year and is on plavix and aspirin.  These are on hold.  John Gamble from GI has seen the patient.  We will get a CTA of the abdomen today given continued bloody bowel movements (1 last night and 2 this morning) here in the hospital.  Patient was able to ambulate in the room and feels relatively well this morning.   PMHx, Fam Hx, and/or Soc Hx : FH of colon cancer in his father.   Vitals:   07/31/21 0318 07/31/21 0800  BP: (!) 141/108 (!) 116/54  Pulse: 75 80  Resp: 20 14  Temp: 97.7 F (36.5 C) 97.7 F (36.5 C)  SpO2: 96% 96%   Gen: Sitting on side of bed, appears stable and well for age Eyes: No conjunctival pallor CV: RR, NR, no murmur, no LE edema Pulm: Breathing comfortably on room air, no wheezing Abd: Soft, NT, ND MSK: Normal bulk and tone for age Skin: No petechiae or bruising noted, some age related skin changes on arms/legs Psych: Pleasant, no distress  H/H have remained stable despite persistent bloody bowel movements.   Assessment and Gamble: I have seen and evaluated the patient as outlined above. I agree with the formulated Assessment and Gamble as detailed in the residents' note, with the following changes:   1. GI bleeding with h/o diverticulosis and hemorrhoids - Continue to trend H/H - CTA of the abdomen, if source of bleeding identified, contact IR - Follow up any further GI recommendations  2.  Elevated WBC on most recent CBC - Trend for fever, evaluate for any infectious symptoms  Other issues per John Gamble daily note.  If CTA abdomen does not show any concerning changes, patient may be able to return home this afternoon.   Sid Falcon, MD 12/31/202211:29 AM

## 2021-07-31 NOTE — Progress Notes (Signed)
Patient had 1 large episode of bloody loose stool. Denies any pain. HGB remains stable at 13.1. Fuller Canada, RN

## 2021-08-01 LAB — CBC
HCT: 35.7 % — ABNORMAL LOW (ref 39.0–52.0)
HCT: 34.6 % — ABNORMAL LOW (ref 39.0–52.0)
Hemoglobin: 12.6 g/dL — ABNORMAL LOW (ref 13.0–17.0)
Hemoglobin: 12.3 g/dL — ABNORMAL LOW (ref 13.0–17.0)
MCH: 31.5 pg (ref 26.0–34.0)
MCH: 31.6 pg (ref 26.0–34.0)
MCHC: 35.3 g/dL (ref 30.0–36.0)
MCHC: 35.5 g/dL (ref 30.0–36.0)
MCV: 89.3 fL (ref 80.0–100.0)
MCV: 88.9 fL (ref 80.0–100.0)
Platelets: 245 10*3/uL (ref 150–400)
Platelets: 231 10*3/uL (ref 150–400)
RBC: 4 MIL/uL — ABNORMAL LOW (ref 4.22–5.81)
RBC: 3.89 MIL/uL — ABNORMAL LOW (ref 4.22–5.81)
RDW: 13.2 % (ref 11.5–15.5)
RDW: 13.3 % (ref 11.5–15.5)
WBC: 9.6 10*3/uL (ref 4.0–10.5)
WBC: 10.7 10*3/uL — ABNORMAL HIGH (ref 4.0–10.5)
nRBC: 0 % (ref 0.0–0.2)
nRBC: 0 % (ref 0.0–0.2)

## 2021-08-01 LAB — GLUCOSE, CAPILLARY
Glucose-Capillary: 170 mg/dL — ABNORMAL HIGH (ref 70–99)
Glucose-Capillary: 262 mg/dL — ABNORMAL HIGH (ref 70–99)
Glucose-Capillary: 174 mg/dL — ABNORMAL HIGH (ref 70–99)
Glucose-Capillary: 230 mg/dL — ABNORMAL HIGH (ref 70–99)

## 2021-08-01 MED ORDER — ASPIRIN 81 MG PO CHEW
81.0000 mg | CHEWABLE_TABLET | Freq: Every day | ORAL | Status: DC
  Administered 2021-08-01 – 2021-08-02 (×2): 81 mg via ORAL
  Filled 2021-08-01 (×2): qty 1

## 2021-08-01 MED ORDER — PEG-KCL-NACL-NASULF-NA ASC-C 100 G PO SOLR
1.0000 | Freq: Once | ORAL | Status: DC
Start: 2021-08-02 — End: 2021-08-01

## 2021-08-01 MED ORDER — PEG-KCL-NACL-NASULF-NA ASC-C 100 G PO SOLR
0.5000 | Freq: Once | ORAL | Status: DC
  Filled 2021-08-01: qty 1

## 2021-08-01 MED ORDER — LOSARTAN POTASSIUM 50 MG PO TABS
50.0000 mg | ORAL_TABLET | Freq: Every day | ORAL | Status: DC
  Administered 2021-08-02: 50 mg via ORAL
  Filled 2021-08-01: qty 1

## 2021-08-01 NOTE — Progress Notes (Signed)
HD#1 SUBJECTIVE:  Patient Summary: John Gamble is a 88 y.o. male with a pertinent PMH of hypertension, type II diabetes mellitus, hyperlipidemia, coronary artery disease s/p left circumflex PCI in 2000 and PCI w/ atherectomy of ostial LAD with DES in 08/624, chronic systolic heart failure 2/2 ischemic cardiomyopathy (EF 45-50%), osteoarthritis s/p bilateral knee replacement, hx of suspected diverticular bleed in 2013, hemorrhoids s/p banding, multiple hernia repair surgeries who presents to [facility] with rectal bleeding. and admitted for GI bleed.   Overnight Events: Patient received LR 500 mL bolus on day shift due to soft blood pressures.  Metoprolol was held overnight.  He had continued bloody bowel movements during the day 07/31/2021.  Interim History: John Gamble reports feeling okay today with no complaints.  He states that the dizziness he has been experiencing is not worse than his baseline.  He denies any pain.  OBJECTIVE:  Vital Signs: Vitals:   08/01/21 0600 08/01/21 0731 08/01/21 0800 08/01/21 0830  BP: 103/60 (!) 92/52  103/63  Pulse: 60 82  (!) 104  Resp: 17 18  20   Temp:  97.7 F (36.5 C)    TempSrc:  Oral    SpO2: 96% 97% 95%   Weight:      Height:       Supplemental O2: Room Air SpO2: 95 %  Filed Weights   07/30/21 1052 07/31/21 0700 08/01/21 0335  Weight: 83 kg 81.2 kg 80.9 kg     Intake/Output Summary (Last 24 hours) at 08/01/2021 0848 Last data filed at 08/01/2021 0300 Gross per 24 hour  Intake 500 ml  Output 850 ml  Net -350 ml   Net IO Since Admission: -1,350 mL [08/01/21 0848]  Physical Exam: Constitutional: Chronically ill elderly gentleman in no acute distress. Cardio: Regular rate and rhythm.  No murmurs, rubs, gallops. Pulm: Clear to auscultation bilaterally.  Normal work of breathing on room air. Abdomen: Soft, nontender, nondistended. RSW:NIOEVOJJ for extremity edema.  Distal pulses are normal. Skin: Skin is warm and dry. Neuro: Alert and oriented  x3.  No focal deficit noted. Psych: Normal mood and affect.  Patient Lines/Drains/Airways Status     Active Line/Drains/Airways     Name Placement date Placement time Site Days   Peripheral IV 07/30/21 20 G Anterior;Left Forearm 07/30/21  1243  Forearm  2   External Urinary Catheter 07/31/21  1901  --  1   Incision (Closed) 09/19/14 Knee Left 09/19/14  1512  -- 2508             ASSESSMENT/PLAN:  Assessment: Principal Problem:   GI bleed Active Problems:   Coronary artery disease   Hypertension   Hyperlipidemia   Type 2 diabetes mellitus without complication, without long-term current use of insulin (HCC)   Acute on chronic combined systolic and diastolic CHF (congestive heart failure) ([place])  John Gamble is a 88 y.o. male with a pertinent PMH of hypertension, type II diabetes mellitus, hyperlipidemia, coronary artery disease s/p left circumflex PCI in 2000 and PCI w/ atherectomy of ostial LAD with DES in 0/0938, chronic systolic heart failure 2/2 ischemic cardiomyopathy (EF 45-50%), osteoarthritis s/p bilateral knee replacement, hx of suspected diverticular bleed in 2013, hemorrhoids s/p banding, multiple hernia repair surgeries who presents to [facility] with rectal bleeding. and admitted for GI bleed.   Plan: #GI bleed Patient is continued to have episodes of bloody bowel movements.  He has been hemodynamically stable with occasional hypotensive episodes, and hemoglobin is stable as well.  He continues to endorse feeling dizzy on ambulation, however describes this as not changed from his baseline. -GI consulted, appreciate their assistance             -Cancel CT angiogram of abdomen/pelvis as it was not felt that his active bleeding was enough for this study to be of high yield.  They would like to proceed with a colonoscopy at this time.  In the setting of chronic Plavix therapy, he will need washout of at least 4 to 5 days prior to proceeding with this procedure.  He does not wish to  go home prior to the procedure being done.  Monday will be bowel prep with Tuesday colonoscopy.   -Transfuse if Hgb less than 7 -Trend CBC   #Hypertension Blood pressure has been quite labile though normotensive on exam.  Managed with home losartan 100 mg daily. -Continue home losartan 100 mg daily.   #Hyperlipidemia Most recent lipid panel on 12/27 remarkable only for elevated triglycerides at 257.  Patient is on rosuvastatin 20 mg daily at home. -Continue rosuvastatin 20 mg daily   #History of urinary retention Currently managed with Flomax 0.4 mg daily, finasteride 5 mg daily. -Continue home Flomax 0.5 mg daily and finasteride 5 mg daily   #Systolic heart failure Managed outpatient with metoprolol 25 mg daily, spironolactone 25 mg daily.  On exam patient is clinically euvolemic. -Hold metoprolol 12.5 mg twice daily secondary to hypotension -Spironolactone 12.5 mg twice daily   #History of coronary artery disease Patient has had multiple stents placed, most recently in April 2022.  He has been on Plavix and aspirin since this procedure. -Holding Plavix and aspirin   #Type 2 diabetes mellitus, insulin independent Most recent HbA1c on 12/27 of 7.4%.  The patient states that he does not medically manage his diabetes, just that he and his primary care physician monitor it. -SSI 3 times daily with meals  Best Practice: Diet: Full liquid diet IVF: Fluids: IV push only, no IV fluids VTE: SCDs Start: 07/30/21 1411 Code: Full AB: None Therapy Recs: Home Health, DME: none DISPO: Anticipated discharge in 3-5 days to Home pending Medical stability and Pending surgery.  Signature: Farrel Gordon, D.O.  Internal Medicine Resident, PGY-1 [facility]  Pager: [phone] 8:48 AM, 08/01/2021   Please contact the on call pager after 5 pm and on weekends at [phone].

## 2021-08-01 NOTE — Progress Notes (Addendum)
BP has been soft since day shift per RN day shift reported. LR 500 ml bolus was  given on day shift. HR when Pt ambulated to bathroom was 130s, with  complaints of feeling dizziness and SOB. At rest his HR 80s-108. Sinus rhythm  with frequent PACs and unifocal PVCs, afebrile, SPO2 96% on room air. Metoprolol was not given for tonight due to soft BP.  Pt had red blood mixed with formed bowel movement x 2-3 times today. He  denied abdominal pain, no nausea and no vomiting. Pt was able to have oral   intake on full liquid diet today. Dr.Patel, the on-call teaching serviced provider  was notified. No new order received. We will continue to monitor.  Kennyth Lose, RN

## 2021-08-01 NOTE — Progress Notes (Addendum)
Patient ID: John Gamble, male   DOB: 10/24/1930, 88 y.o.   MRN: [id number]    Progress Note   Subjective  Day # 3 CC: lower GI bleeding  Hgb 12.3 down from 13.4 yesterday Plavix on hold  Patient has no complaints today, no abdominal pain nursing reports large bowel movement which was brown but no obvious blood    Objective   Vital signs in last 24 hours: Temp:  [97.6 F (36.4 C)-98.1 F (36.7 C)] 97.9 F (36.6 C) (01/01 1114) Pulse Rate:  [60-137] 69 (01/01 1114) Resp:  [15-27] 18 (01/01 1114) BP: (80-119)/(45-78) 101/45 (01/01 1114) SpO2:  [90 %-99 %] 99 % (01/01 1114) Weight:  [80.9 kg] 80.9 kg (01/01 0335) Last BM Date: 08/01/21 General:    Elderly white male in NAD Heart:  Regular rate and rhythm; no murmurs Lungs: Respirations even and unlabored, lungs CTA bilaterally Abdomen:  Soft, nontender and nondistended. Normal bowel sounds. Extremities:  Without edema. Neurologic:  Alert and oriented,  grossly normal neurologically. Psych:  Cooperative. Normal mood and affect.  Intake/Output from previous day: 12/31 0701 - 01/01 0700 In: 500 [P.O.:500] Out: 850 [Urine:850] Intake/Output this shift: Total I/O In: 240 [P.O.:240] Out: -   Lab Results: Recent Labs    07/31/21 1652 08/01/21 0202 08/01/21 0526  WBC 11.7* 9.6 10.7*  HGB 13.4 12.6* 12.3*  HCT 38.8* 35.7* 34.6*  PLT 306 245 231   BMET Recent Labs    07/30/21 1100 07/31/21 0321  NA 135 137  K 4.4 3.9  CL 107 107  CO2 23 20*  GLUCOSE 187* 155*  BUN 16 13  CREATININE 0.95 0.87  CALCIUM 9.2 9.0   LFT Recent Labs    07/30/21 1100  PROT 6.4*  ALBUMIN 3.9  AST 19  ALT 25  ALKPHOS 73  BILITOT 1.0   PT/INR No results for input(s): LABPROT, INR in the last 72 hours.     Assessment / Plan:    #34 88 year old white male admitted with acute lower GI bleeding with burgundy appearing stools in setting of aspirin and Plavix Previously documented diverticulosis and bleeding is consistent with  diverticular bleed  Last colonoscopy over 6 years ago not rule out other sources of blood loss.  Hemoglobin has drifted about 2 g since admission No transfusion requirement  #2 coronary artery disease status post LAD stent April 2022-on aspirin and Plavix since Last dose of Plavix 07/31/2019. Will resume baby aspirin  #3 chronic kidney disease stage III #4  Hypertension #5  History of ischemic cardiomyopathy EF 45 to 50%  Plan:  Allow solid diet, then start clear liquids tomorrow Bowel prep tomorrow/Tuesday-plan colonoscopy Tuesday or Wednesday. Continue to trend hemoglobin    LOS: 1 day   Amy Esterwood PA-C 08/01/2021, 3:38 PM     Attending Physician Note   I have taken an interval history, reviewed the chart and examined the patient. I personally saw the patient and performed more than 50% of this encounter in conjunction with the APP. I agree with the APP's note, impression and recommendations. My additional impressions and recommendations are as follows.   Lucio Edward, MD [facility] See [facility], [facility], for our on call provider

## 2021-08-02 ENCOUNTER — Encounter (HOSPITAL_COMMUNITY): Payer: Self-pay | Admitting: Internal Medicine

## 2021-08-02 DIAGNOSIS — I5022 Chronic systolic (congestive) heart failure: Secondary | ICD-10-CM

## 2021-08-02 LAB — CBC WITH DIFFERENTIAL/PLATELET
Abs Immature Granulocytes: 0.02 10*3/uL (ref 0.00–0.07)
Basophils Absolute: 0.1 10*3/uL (ref 0.0–0.1)
Basophils Relative: 1 %
Eosinophils Absolute: 0.1 10*3/uL (ref 0.0–0.5)
Eosinophils Relative: 2 %
HCT: 33.9 % — ABNORMAL LOW (ref 39.0–52.0)
Hemoglobin: 11.5 g/dL — ABNORMAL LOW (ref 13.0–17.0)
Immature Granulocytes: 0 %
Lymphocytes Relative: 28 %
Lymphs Abs: 2.2 10*3/uL (ref 0.7–4.0)
MCH: 30.8 pg (ref 26.0–34.0)
MCHC: 33.9 g/dL (ref 30.0–36.0)
MCV: 90.9 fL (ref 80.0–100.0)
Monocytes Absolute: 0.6 10*3/uL (ref 0.1–1.0)
Monocytes Relative: 8 %
Neutro Abs: 4.8 10*3/uL (ref 1.7–7.7)
Neutrophils Relative %: 61 %
Platelets: 229 10*3/uL (ref 150–400)
RBC: 3.73 MIL/uL — ABNORMAL LOW (ref 4.22–5.81)
RDW: 13.4 % (ref 11.5–15.5)
WBC: 7.8 10*3/uL (ref 4.0–10.5)
nRBC: 0 % (ref 0.0–0.2)

## 2021-08-02 LAB — BASIC METABOLIC PANEL
Anion gap: 7 (ref 5–15)
BUN: 13 mg/dL (ref 8–23)
CO2: 25 mmol/L (ref 22–32)
Calcium: 9 mg/dL (ref 8.9–10.3)
Chloride: 104 mmol/L (ref 98–111)
Creatinine, Ser: 1 mg/dL (ref 0.61–1.24)
GFR, Estimated: >60 mL/min (ref >60)
Glucose, Bld: 168 mg/dL — ABNORMAL HIGH (ref 70–99)
Potassium: 4.3 mmol/L (ref 3.5–5.1)
Sodium: 136 mmol/L (ref 135–145)

## 2021-08-02 LAB — MAGNESIUM: Magnesium: 2.1 mg/dL (ref 1.7–2.4)

## 2021-08-02 LAB — GLUCOSE, CAPILLARY
Glucose-Capillary: 171 mg/dL — ABNORMAL HIGH (ref 70–99)
Glucose-Capillary: 253 mg/dL — ABNORMAL HIGH (ref 70–99)
Glucose-Capillary: 172 mg/dL — ABNORMAL HIGH (ref 70–99)
Glucose-Capillary: 190 mg/dL — ABNORMAL HIGH (ref 70–99)

## 2021-08-02 MED ORDER — ASPIRIN 81 MG PO CHEW
81.0000 mg | CHEWABLE_TABLET | Freq: Every day | ORAL | Status: DC
  Administered 2021-08-03 – 2021-08-05 (×3): 81 mg via ORAL
  Filled 2021-08-02 (×3): qty 1

## 2021-08-02 MED ORDER — SODIUM CHLORIDE 0.9 % IV SOLN: INTRAVENOUS | Status: DC

## 2021-08-02 MED ORDER — METOPROLOL SUCCINATE ER 25 MG PO TB24
25.0000 mg | ORAL_TABLET | Freq: Every day | ORAL | Status: DC
  Administered 2021-08-02 – 2021-08-05 (×4): 25 mg via ORAL
  Filled 2021-08-02 (×4): qty 1

## 2021-08-02 MED ORDER — LOSARTAN POTASSIUM 25 MG PO TABS
25.0000 mg | ORAL_TABLET | Freq: Every day | ORAL | Status: DC
  Administered 2021-08-03 – 2021-08-05 (×3): 25 mg via ORAL
  Filled 2021-08-02 (×3): qty 1

## 2021-08-02 NOTE — Progress Notes (Signed)
°  Transition of Care [facility]) Screening Note   Patient Details  Name: John Gamble Date of Birth: [birth year]   Transition of Care [facility]) CM/SW Contact:    Dawayne Patricia, RN Phone Number: 08/02/2021, 12:00 PM    Transition of Care Department [facility]) has reviewed patient and noted plans for colonoscopy 08/03/21, recommendations per PT/OT for outpt follow up. We will continue to monitor patient advancement through interdisciplinary progression rounds. If new patient transition needs arise, please place a TOC consult.

## 2021-08-02 NOTE — Progress Notes (Signed)
Occupational Therapy Treatment Patient Details Name: John Gamble MRN: [id number] DOB: 29-Apr-1931 Today's Date: 08/02/2021   History of present illness John Gamble is a 88 y.o. male, who presented to the ED 12/30 after multiple episodes of bloody stool;  probable lower GI bleed. PMHx: CAD, HLD, HTN, OA, MI   OT comments  Patient continues to make steady progress towards goals in skilled OT session. Patient's session encompassed  functional mobility to complete household distances, ADLs at the sink, and toileting. Patient with improved bed mobility and transfers to date, requiring no physical assist. Patient extremely motivated to get back to his previous level of function after colonoscopy tomorrow. Discharge remains appropriate, therapy will continue to follow.    Recommendations for follow up therapy are one component of a multi-disciplinary discharge planning process, led by the attending physician.  Recommendations may be updated based on patient status, additional functional criteria and insurance authorization.    Follow Up Recommendations  Outpatient OT    Assistance Recommended at Discharge Intermittent Supervision/Assistance  Equipment Recommendations  BSC/3in1    Recommendations for Other Services      Precautions / Restrictions Precautions Precautions: Fall       Mobility Bed Mobility Overal bed mobility: Needs Assistance Bed Mobility: Supine to Sit;Sit to Supine     Supine to sit: Supervision Sit to supine: Supervision        Transfers Overall transfer level: Needs assistance Equipment used: Rolling walker (2 wheels) Transfers: Sit to/from Stand Sit to Stand: Min guard           General transfer comment: cues for hand placement, no assist given     Balance Overall balance assessment: Needs assistance Sitting-balance support: Feet supported Sitting balance-Leahy Scale: Fair     Standing balance support: Single extremity supported;During functional  activity Standing balance-Leahy Scale: Fair Standing balance comment: is able to complete simple ADLs without support at sink, but balance improves with BUE support when ambulating                           ADL either performed or assessed with clinical judgement   ADL Overall ADL's : Needs assistance/impaired     Grooming: Min guard;Standing;Wash/dry hands;Wash/dry Licensed conveyancer (2 wheels);Ambulation;Min guard   Toileting- Clothing Manipulation and Hygiene: Supervision/safety;Sit to/from stand       Functional mobility during ADLs: Min guard;Rolling walker (2 wheels) General ADL Comments: no dizziness with movement in session, increased ability to boost off of low surfaces    Extremity/Trunk Assessment              Vision       Perception     Praxis      Cognition Arousal/Alertness: Awake/alert Behavior During Therapy: WFL for tasks assessed/performed Overall Cognitive Status: Within Functional Limits for tasks assessed                                            Exercises     Shoulder Instructions       General Comments      Pertinent Vitals/ Pain       Pain Assessment: No/denies pain  Home Living  Prior Functioning/Environment              Frequency  Min 2X/week        Progress Toward Goals  OT Goals(current goals can now be found in the care plan section)  Progress towards OT goals: Progressing toward goals  Acute Rehab OT Goals Patient Stated Goal: to go home OT Goal Formulation: With patient Time For Goal Achievement: 08/14/21 Potential to Achieve Goals: Good  Plan Discharge plan remains appropriate    Co-evaluation                 AM-PAC OT "6 Clicks" Daily Activity     Outcome Measure   Help from another person eating meals?: None Help from another person taking care of personal  grooming?: None Help from another person toileting, which includes using toliet, bedpan, or urinal?: None Help from another person bathing (including washing, rinsing, drying)?: A Little Help from another person to put on and taking off regular upper body clothing?: None Help from another person to put on and taking off regular lower body clothing?: A Little 6 Click Score: 22    End of Session Equipment Utilized During Treatment: Gait belt;Rolling walker (2 wheels)  OT Visit Diagnosis: Unsteadiness on feet (R26.81);Muscle weakness (generalized) (M62.81);Pain   Activity Tolerance Patient tolerated treatment well   Patient Left in bed;with call bell/phone within reach   Nurse Communication Mobility status (pt having BM notifying RN to double check for blood)        Time: 3491-7915 OT Time Calculation (min): 27 min  Charges: OT General Charges $OT Visit: 1 Visit OT Treatments $Self Care/Home Management : 23-37 mins  [place]. Jadin Creque, COTA/L Acute Rehabilitation Services [place] 08/02/2021, 12:33 PM

## 2021-08-02 NOTE — Consult Note (Addendum)
Cardiology Consultation:   Patient ID: John Gamble MRN: [id number]; DOB: 13-Nov-1930  Admit date: 07/30/2021 Date of Consult: 08/02/2021  PCP:  Algis Greenhouse, MD   [facility] Providers Cardiologist:  Peter [name], MD        Patient Profile:   John Gamble is a 88 y.o. male with a hx of DM2, HTN, HLD, CAD w/ BMS CFX 2000, DES LAD w/ atherectomy 10/2020, HFrEF, OA, hernia repair surgeries, hemorrhoids w/ banding, presumed diverticular bleed 2013, who was admitted w/ GIB, and is being seen 08/02/2021 for the evaluation of PVCs at the request of Dr Daryll Drown.  History of Present Illness:   John Gamble was doing well when last seen by Dr [name] 03/2021. He had gained 15 lbs, but was exercising regularly. Entresto was increased to 97/103 mg bid, on Toprol XL 25 mg qd.  He came to the hospital on 07/30/2021 for rectal bleeding.  He states that he had multiple bloody stools on Friday and Friday night and then on Saturday he had a stool that was formed and normal in color, but had a little bit of blood with it.  He was seen by gastroenterology, and their plan is to do a colonoscopy on Tuesday or Wednesday.  However, he has had a great deal of ectopy including PACs and PVCs on telemetry, along with sinus tach and cardiology was asked to evaluate him.  John Gamble is not aware of the arrhythmia.  He has no history of presyncope or syncope.  He has never had palpitations.  As he got to the hospital, he has had a little bit of orthostatic dizziness.  However, he has not had near syncope or been in danger of falling or losing consciousness.  He has ambulated with physical therapy and Occupational Therapy.  He has also walked in the halls by himself, with no symptoms.  The year 2022 was physically difficult for him.  After he got his stent in April 2022, he was trying to get back to exercising.  However, he got a UTI and had to have an indwelling catheter for period of time so could not exercise.  After  that, he had a skin cancer on his ear.  This was removed but required multiple procedures and has finally healed.  Because of all this, he has not been able to exercise as often as he normally does.  However, when he does exercise, he does not have any chest pain or shortness of breath.  He says that the lower GI bleed he had in 2013 ended up being a hemorrhoidal bleed, according to Dr. Earlean Shawl.  Right now, he feels fine.  He wants to get the colonoscopy over with so he can go home.   Past Medical History:  Diagnosis Date   Coronary artery disease    Hyperlipidemia    Hypertension    MI, old    OA (osteoarthritis)    Pneumonia    hx of 2013     Past Surgical History:  Procedure Laterality Date   CARDIAC CATHETERIZATION  01/26/1999   EF 55%   CARDIAC CATHETERIZATION N/A 05/06/2015   Procedure: Left Heart Cath and Coronary Angiography;  Surgeon: Peter M [name], MD;  Location: [place] [facility];  Service: Cardiovascular;  Laterality: N/A;   CARDIOVASCULAR STRESS TEST  02/22/2010   EF 61%   corneal implant     left   CORONARY ATHERECTOMY N/A 11/04/2020   Procedure: CORONARY ATHERECTOMY;  Surgeon: [name], Peter  M, MD;  Location: [place] [facility];  Service: Cardiovascular;  Laterality: N/A;   CORONARY STENT INTERVENTION N/A 11/04/2020   Procedure: CORONARY STENT INTERVENTION;  Surgeon: [name], Peter M, MD;  Location: [place] [facility];  Service: Cardiovascular;  Laterality: N/A;   CORONARY STENT PLACEMENT  2000   BMS LEFT CIRCUMFLEX CORONARY   INGUINAL HERNIA REPAIR     INTRAVASCULAR ULTRASOUND/IVUS N/A 11/04/2020   Procedure: Intravascular Ultrasound/IVUS;  Surgeon: [name], Peter M, MD;  Location: [place] [facility];  Service: Cardiovascular;  Laterality: N/A;   REPLACEMENT TOTAL KNEE     RIGHT/LEFT HEART CATH AND CORONARY ANGIOGRAPHY N/A 10/27/2020   Procedure: RIGHT/LEFT HEART CATH AND CORONARY ANGIOGRAPHY;  Surgeon: [name], Peter M, MD;  Location: [place] [facility];   Service: Cardiovascular;  Laterality: N/A;   TOTAL KNEE ARTHROPLASTY Left 09/19/2014   Procedure: LEFT TOTAL KNEE ARTHROPLASTY;  Surgeon: Sydnee Cabal, MD;  Location: [facility];  Service: Orthopedics;  Laterality: Left;     Home Medications:  Prior to Admission medications   Medication Sig Start Date End Date Taking? Authorizing Provider  acyclovir (ZOVIRAX) 800 MG tablet Take 800 mg by mouth daily.  03/03/14  Yes [provider]  aspirin EC 81 MG tablet Take 81 mg by mouth daily.   Yes [provider]  clopidogrel (PLAVIX) 75 MG tablet Take 1 tablet (75 mg total) by mouth daily. 10/27/20 10/27/21 Yes [name], Peter M, MD  dorzolamide (TRUSOPT) 2 % ophthalmic solution Place 1 drop into the right eye at bedtime.   Yes [provider]  finasteride (PROSCAR) 5 MG tablet Take 1 tablet (5 mg total) by mouth daily. 11/20/20  Yes [name], Peter M, MD  fluconazole (DIFLUCAN) 100 MG tablet Take 100 mg by mouth daily. 12/15/20  Yes [provider]  Glucosamine-Chondroitin 250-200 MG TABS Take 2 tablets by mouth 2 (two) times daily.   Yes [provider]  losartan (COZAAR) 100 MG tablet Take 1 tablet (100 mg total) by mouth daily. 01/19/21 07/30/21 Yes [name], Peter M, MD  metoprolol succinate (TOPROL-XL) 25 MG 24 hr tablet Take 1 tablet (25 mg total) by mouth daily. 11/12/20  Yes [name], Peter M, MD  Multiple Vitamin (MULTIVITAMIN WITH MINERALS) TABS tablet Take 1 tablet by mouth daily. Centrum Silver   Yes [provider]  Omega-3 Fatty Acids (FISH OIL) 1200 MG CAPS Take 1,200 mg by mouth daily.   Yes [provider]  polyethylene glycol (MIRALAX / GLYCOLAX) packet Take 17 g by mouth daily. Mix with 8 oz liquid and drink   Yes [provider]  prednisoLONE acetate (PRED FORTE) 1 % ophthalmic suspension Place 1 drop into the left eye daily. 09/12/20  Yes [provider]  rosuvastatin (CRESTOR) 20 MG tablet Take 20 mg by mouth daily.    Yes [provider]  silodosin (RAPAFLO) 8 MG CAPS capsule Take 8 mg by mouth daily. 12/04/20  Yes [provider]  spironolactone (ALDACTONE) 25 MG tablet Take 1 tablet (25 mg total) by mouth daily. Patient taking differently: Take 12.5 mg by mouth daily. 11/12/20  Yes [name], Peter M, MD  Zinc 50 MG CAPS Take 50 mg by mouth daily with supper.    Yes [provider]  glucose blood (ONETOUCH ULTRA) test strip USE 1 STRIP DAILY. 08/20/20   [provider]  Lancets (ONETOUCH DELICA PLUS [id number]) [facility] SMARTSIG:1 Unit(s) Topical Daily 07/01/20   [provider]  [facility] ULTRA test strip daily. 08/20/20  [provider]    Inpatient Medications: Scheduled Meds:  [START ON 08/03/2021] aspirin  81 mg Oral Daily   dorzolamide  1 drop Right Eye QHS   finasteride  5 mg Oral Daily   insulin aspart  0-9 Units Subcutaneous TID WC   losartan  50 mg Oral Daily   prednisoLONE acetate  1 drop Left Eye Daily   rosuvastatin  20 mg Oral Daily   sodium chloride flush  3 mL Intravenous Q12H   tamsulosin  0.4 mg Oral Daily   Continuous Infusions:  PRN Meds: acetaminophen **OR** acetaminophen, ondansetron **OR** ondansetron (ZOFRAN) IV  Allergies:    Allergies  Allergen Reactions   Atorvastatin Other (See Comments)    myalgias   Oysters [Shellfish Allergy] Nausea Only   Sulfa Antibiotics Nausea And Vomiting    Social History:   Social History   Socioeconomic History   Marital status: Married    Spouse name: Not on file   Number of children: 2   Years of education: Not on file   Highest education level: Not on file  Occupational History   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    Employer: RETIRED    Comment: retired  Tobacco Use   Smoking status: Former    Packs/day: 0.50    Years: 50.00    Pack years: 25.00    Types: Cigarettes    Quit date: 02/23/1989    Years since quitting: 32.4   Smokeless tobacco: Never  Vaping Use   Vaping Use: Never  used  Substance and Sexual Activity   Alcohol use: No   Drug use: No   Sexual activity: Yes    Birth control/protection: None  Other Topics Concern   Not on file  Social History Narrative   Not on file   Social Determinants of Health   Financial Resource Strain: Not on file  Food Insecurity: Not on file  Transportation Needs: Not on file  Physical Activity: Not on file  Stress: Not on file  Social Connections: Not on file  Intimate Partner Violence: Not on file    Family History:   Family History  Problem Relation Age of Onset   Cancer Mother    Cancer Father      ROS:  Please see the history of present illness.  All other ROS reviewed and negative.     Physical Exam/Data:   Vitals:   08/01/21 2300 08/01/21 2329 08/02/21 0400 08/02/21 0836  BP:  93/63 91/64 (!) 136/58  Pulse:   62 61  Resp: 18 20 16 20   Temp:  97.8 F (36.6 C) 97.7 F (36.5 C) (!) 97.4 F (36.3 C)  TempSrc:  Oral Oral Oral  SpO2:  94%  96%  Weight:   79.8 kg   Height:        Intake/Output Summary (Last 24 hours) at 08/02/2021 1349 Last data filed at 08/02/2021 0838 Gross per 24 hour  Intake 250 ml  Output 1050 ml  Net -800 ml   Last 3 Weights 08/02/2021 08/01/2021 07/31/2021  Weight (lbs) 176 lb 178 lb 4.8 oz 179 lb  Weight (kg) 79.833 kg 80.876 kg 81.194 kg     Body mass index is 23.22 kg/m.  General:  Well developed, slender elderly male, in no acute distress HEENT: normal Neck: no JVD Vascular: No carotid bruits; Distal pulses 2+ bilaterally Cardiac:  normal S1, S2; RRR; no murmur  Lungs:  clear to auscultation bilaterally, no wheezing, rhonchi or rales  Abd: soft, nontender,  no hepatomegaly  Ext: no edema Musculoskeletal:  No deformities, BUE and BLE strength normal and equal Skin: warm and dry  Neuro:  CNs 2-12 intact, no focal abnormalities noted Psych:  Normal affect   EKG:  The EKG was personally reviewed and demonstrates:  ST, frequent PVCs, HR 113 Telemetry:  Telemetry was  personally reviewed and demonstrates: Sinus rhythm, mostly sinus tach, PACs, PVCs, pairs, up to 4 bt runs NSVT  Relevant CV Studies:  ECHO: 01/19/2021  1. Left ventricular ejection fraction, by estimation, is 45 to 50%. The left ventricle has mildly decreased function. The left ventricle  demonstrates regional wall motion abnormalities (see scoring  diagram/findings for description). Left ventricular diastolic parameters are consistent with Grade I diastolic dysfunction (impaired relaxation). There is severe akinesis of the left ventricular, basal inferior wall. EF has improved significantly since we opened his LAD and have him on good medical therapy. EF from 25% to now 45-50%  2. Right ventricular systolic function is normal. The right ventricular size is normal. There is normal pulmonary artery systolic pressure.   3. Right atrial size was mildly dilated.   4. The mitral valve is normal in structure. Trivial mitral valve  regurgitation.   5. The aortic valve is tricuspid. Aortic valve regurgitation is mild.   6. There is dilatation of the ascending aorta.   CARDIAC CATH: 10/27/2020 Ost LAD to Prox LAD lesion is 95% stenosed. 1st Diag lesion is 70% stenosed. Previously placed Ost Cx to Prox Cx stent (unknown type) is widely patent. Previously placed LPDA stent (unknown type) is widely patent. Ost RCA to Prox RCA lesion is 90% stenosed. There is severe left ventricular systolic dysfunction. LV end diastolic pressure is normal. The left ventricular ejection fraction is 25-35% by visual estimate.   1. Severe 2 vessel obstructive CAD.    - 90% ostial LAD. Severely calcified. 70% first diagonal    - patent stents in the proximal LCx and left PDA    - 90% ostial nondominant RCA 2. Severe LV dysfunction with regional wall motion abnormalities. 3. Normal LV filling pressures 4. Normal right heart pressures. 5. Preserved cardiac output.    Plan: recommend staged complex  PCI of the ostial  LAD with atherectomy and stenting. The diagonal and RCA disease are old and involve small vessels. While CABG would be an option given his advanced age I would favor a percutaneous approach.  CARDIAC CATH/PCI: 11/04/2020  Ost LAD to Prox LAD lesion is 95% stenosed. A drug-eluting stent was successfully placed using a SYNERGY XD [id number]. Post intervention, there is a 0% residual stenosis.   1. Successful PCI of the ostial LAD with IVUS guidance, orbital atherectomy and DES x 1  Diagnostic Dominance: Left Intervention     Laboratory Data:  High Sensitivity Troponin:  No results for input(s): TROPONINIHS in the last 720 hours.   Chemistry Recent Labs  Lab 07/30/21 1100 07/31/21 0321 08/02/21 0456  NA 135 137 136  K 4.4 3.9 4.3  CL 107 107 104  CO2 23 20* 25  GLUCOSE 187* 155* 168*  BUN 16 13 13   CREATININE 0.95 0.87 1.00  CALCIUM 9.2 9.0 9.0  MG  --   --  2.1  GFRNONAA >60 >60 >60  ANIONGAP 5 10 7     Recent Labs  Lab 07/30/21 1100  PROT 6.4*  ALBUMIN 3.9  AST 19  ALT 25  ALKPHOS 73  BILITOT 1.0   Lipids No results for input(s): CHOL, TRIG, HDL, LABVLDL,  LDLCALC, CHOLHDL in the last 168 hours.  Hematology Recent Labs  Lab 08/01/21 0202 08/01/21 0526 08/02/21 0456  WBC 9.6 10.7* 7.8  RBC 4.00* 3.89* 3.73*  HGB 12.6* 12.3* 11.5*  HCT 35.7* 34.6* 33.9*  MCV 89.3 88.9 90.9  MCH 31.5 31.6 30.8  MCHC 35.3 35.5 33.9  RDW 13.2 13.3 13.4  PLT 245 231 229   Thyroid No results for input(s): TSH, FREET4 in the last 168 hours.  BNPNo results for input(s): BNP, PROBNP in the last 168 hours.  DDimer No results for input(s): DDIMER in the last 168 hours.   Radiology/Studies:  No results found.   Assessment and Plan:   Lower GI bleed -GI has seen and plans colonoscopy when cleared by cards - He has had lower GI bleeding in the past, he was hospitalized in 2013 and the bleeding was thought to be diverticular, but turned out to be hemorrhoidal according to the  patient. - He has had banding of his hemorrhoids in the past - He generally feels well, needs colonoscopy to determine the cause of the bleeding - He had almost no p.o. intake for 2 days and not much since then, we will gently hydrate him  2.  CAD: - At the time of his cath in March 2022, he had a 90% RCA and 95% proximal LAD. - The LAD was treated with a drug-eluting stent and staged intervention. - The RCA is a small vessel and not felt amenable to PCI - He has been on a beta-blocker and statin. -However, the beta-blocker is on hold because of hypotension -Because of the bleeding, aspirin and Plavix are on hold - Watch for ischemic symptoms  3.  HFrEF - He has not been on a diuretic at home. - The Toprol-XL has been 25 mg daily for a while, but is currently on hold due to hypotension - Dr. [name] prescribed Entresto 98-1 03, but the patient is currently on losartan 100 mg daily at home  -Losartan has been cut back to 50 mg daily - He is also on spironolactone 12.5 mg daily, but this is also been discontinued due to hypotension. - He is not volume overloaded by exam and intake/output is negative by 1.6 L without diuretic use. -Discuss with MD cutting the losartan back further and restarting the beta-blocker  4.  Arrhythmia - Has a long history of PVCs, there are multiple PVCs on all ECGs in 2022 - He is completely asymptomatic with them - His baseline heart rate is elevated now, I am concerned that he is a little dry from the bleeding. - I will start gentle hydration. - His potassium is generally at or greater than 4, magnesium today was 2.1 so no electrolyte supplementation is needed - Discuss with MD decreasing the losartan in order to restart the beta-blocker - MD advise if the PVCs and PACs are reason enough to defer his colonoscopy  Risk Assessment/Risk Scores:       New York Heart Association (NYHA) Functional Class NYHA Class II   For questions or updates, please  contact [facility] Please consult [email or web address] for contact info under    Signed, Rosaria Ferries, PA-C  08/02/2021 1:49 PM  Attending note Patient seen and discussed with PA Barrett, I agee with her documentation. 88 yo male history of CAD with prior LCX stent in 2000 and complex LAD PCI in 10/2020, HTN, HL, prior severe systolic dysfunction with LVEF improved to 45-50% by 12/2020 cath, admitted with rectal bleeding.  Admitted to medicine team with GI following, cardiology consulted for PVCs this admission    Admit labs K 4.4 Cr 0.95 BUN 16 CBC 8.5 Plt 251 Hgb 14.3 FOBT + COVID neg flu neg EKG SR, PVCs    PVCs - likely scar mediated given prior CAD. PVCs noted on prior EKGs prior to this admission. Tele shows isolated PVCs, bigeminy, couplets. I do not see any significant runs of NSVT or VT. K and Mg have been at goal - his home toprol was held in setting of soft bp's with GI bleed - would restart toprol at 25mg  daily and monitor  2. CAD - recent complex LAD PCI as reported above 10/2020. Had been on aspirin and plavix at home, plavix on hold in setting of GI bleeding with planned endoscopy.  - nearly 9 months since his PCI, reasnable to hold plavix at this time and resume after endoscopy, continue ASA.  3. Chronic systolic HF - 10/6801 echo LVEF as lowe as 25-30%, most recently 12/2020 LVEF 45-50%. - soft bp's in setting of GI bleeding - toprol and aldactone held on admission, losartan continued but lower dose 50mg  daily.  - would lower losartan to 25mg  daily to allow room to restart beta blocker given PVCs - appears dry, has had significant blood loss, agree with gentle IVFs  4. GI bleed - per GI - admit Hgb 14.4, down to 11.5 today.   From cardiac standpoint ok to proceed with GI endoscopy procedures.  Carlyle Dolly MD

## 2021-08-02 NOTE — Progress Notes (Signed)
Physical Therapy Treatment Patient Details Name: John Gamble MRN: [id number] DOB: 27-Feb-1931 Today's Date: 08/02/2021   History of Present Illness John Gamble is a 88 y.o. male, who presented to the ED 12/30 after multiple episodes of bloody stool;  probable lower GI bleed. PMHx: CAD, HLD, HTN, OA, MI    PT Comments    Patient progressing with mobility and able to walk full hallway distance x 2 after seated rest break in the middle.  Patient with HR max 155 and sustaining in 120's -130's at rest in hallway.  Patient without complaints but mild dizziness when questioned.  Feel he should be able to d/c home following procedures and hopeful for follow up outpatient rehab when stable if approved by heart MD.   Recommendations for follow up therapy are one component of a multi-disciplinary discharge planning process, led by the attending physician.  Recommendations may be updated based on patient status, additional functional criteria and insurance authorization.  Follow Up Recommendations  Outpatient PT (Has been to [facility] in the past, needs approval per cardiologist)     Assistance Recommended at Discharge Intermittent Supervision/Assistance  Equipment Recommendations  None recommended by PT    Recommendations for Other Services       Precautions / Restrictions Precautions Precautions: Fall Precaution Comments: watch HR     Mobility  Bed Mobility Overal bed mobility: Needs Assistance Bed Mobility: Supine to Sit;Sit to Supine     Supine to sit: Modified independent (Device/Increase time) Sit to supine: Modified independent (Device/Increase time)        Transfers Overall transfer level: Needs assistance Equipment used: Rolling walker (2 wheels) Transfers: Sit to/from Stand Sit to Stand: Min guard           General transfer comment: two attempts unaided, some support up from low bed for anterior weight shift    Ambulation/Gait Ambulation/Gait assistance: Min  guard Gait Distance (Feet): 400 Feet (x 2) Assistive device: Rollator (4 wheels) Gait Pattern/deviations: Step-through pattern;Decreased stride length;Trunk flexed       General Gait Details: mod cues for posture with rollator ("hands at hips") with improvement in posture, one seated rest then again walked 400', HR max 155, pt with minor c/o dizziness only when asked, noted no SOB   Stairs             Wheelchair Mobility    Modified Rankin (Stroke Patients Only)       Balance Overall balance assessment: Needs assistance Sitting-balance support: Feet supported Sitting balance-Leahy Scale: Good     Standing balance support: Single extremity supported;During functional activity Standing balance-Leahy Scale: Fair Standing balance comment: stood at sink to brush teeth, wash hands after toileting, completed toilet hygiene unaided                            Cognition Arousal/Alertness: Awake/alert Behavior During Therapy: WFL for tasks assessed/performed Overall Cognitive Status: Within Functional Limits for tasks assessed                                          Exercises      General Comments        Pertinent Vitals/Pain Pain Assessment: No/denies pain    Home Living  Prior Function            PT Goals (current goals can now be found in the care plan section) Progress towards PT goals: Progressing toward goals    Frequency    Min 3X/week      PT Plan Discharge plan needs to be updated;Current plan remains appropriate    Co-evaluation              AM-PAC PT "6 Clicks" Mobility   Outcome Measure  Help needed turning from your back to your side while in a flat bed without using bedrails?: None Help needed moving from lying on your back to sitting on the side of a flat bed without using bedrails?: None Help needed moving to and from a bed to a chair (including a wheelchair)?:  None Help needed standing up from a chair using your arms (e.g., wheelchair or bedside chair)?: A Little Help needed to walk in hospital room?: A Little Help needed climbing 3-5 steps with a railing? : A Little 6 Click Score: 21    End of Session Equipment Utilized During Treatment: Gait belt Activity Tolerance: Patient tolerated treatment well Patient left: in bed;with call bell/phone within reach   PT Visit Diagnosis: Other abnormalities of gait and mobility (R26.89);Unsteadiness on feet (R26.81);History of falling (Z91.81)     Time: 1105-1140 PT Time Calculation (min) (ACUTE ONLY): 35 min  Charges:  $Gait Training: 23-37 mins                     Magda Kiel, PT Acute Rehabilitation Services Pager:[phone] [phone] 08/02/2021    Reginia Naas 08/02/2021, 1:25 PM

## 2021-08-02 NOTE — Progress Notes (Signed)
HD#2 SUBJECTIVE:  The patient was seen at bedside this morning.  He endorses some dizziness, however this is overall unchanged.  He states that he has not had a bloody bowel movement since the weekend.  Denies abdominal pain.  States that he was seen by GI this morning for planning to give him bowel prep today for colonoscopy tomorrow.  No other complaints or concerns.  OBJECTIVE:  Vital Signs: Vitals:   08/01/21 1940 08/01/21 2300 08/01/21 2329 08/02/21 0400  BP: (!) 118/47  93/63 91/64  Pulse: 85   62  Resp: 19 18 20 16   Temp: 97.6 F (36.4 C)  97.8 F (36.6 C) 97.7 F (36.5 C)  TempSrc: Oral  Oral Oral  SpO2: 95%  94%   Weight:    79.8 kg  Height:       Supplemental O2: Room Air SpO2: 94 %  Filed Weights   07/31/21 0700 08/01/21 0335 08/02/21 0400  Weight: 81.2 kg 80.9 kg 79.8 kg     Intake/Output Summary (Last 24 hours) at 08/02/2021 4098 Last data filed at 08/02/2021 0502 Gross per 24 hour  Intake 730 ml  Output 850 ml  Net -120 ml    Net IO Since Admission: -1,470 mL [08/02/21 0653]  Physical Exam: Constitutional: Chronically ill elderly gentleman in no acute distress. Cardio: Regular rate and rhythm.  No murmurs, rubs, gallops. Pulm: Clear to auscultation bilaterally.  Normal work of breathing on room air. Abdomen: Soft, nontender, nondistended. MSK: Negative for extremity edema.  Distal pulses are normal. Skin: Skin is warm and dry. Neuro: Alert and oriented x3.  No focal deficit noted. Psych: Normal mood and affect.  Patient Lines/Drains/Airways Status     Active Line/Drains/Airways     Name Placement date Placement time Site Days   Peripheral IV 07/30/21 20 G Anterior;Left Forearm 07/30/21  1243  Forearm  2   External Urinary Catheter 07/31/21  1901  --  1   Incision (Closed) 09/19/14 Knee Left 09/19/14  1512  -- 2508             ASSESSMENT/PLAN:  Assessment: Principal Problem:   GI bleed Active Problems:   Coronary artery disease    Hypertension   Hyperlipidemia   Type 2 diabetes mellitus without complication, without long-term current use of insulin (HCC)   Acute on chronic combined systolic and diastolic CHF (congestive heart failure) ([place])  John Gamble is a 88 y.o. male with a pertinent PMH of hypertension, type II diabetes mellitus, hyperlipidemia, coronary artery disease s/p left circumflex PCI in 2000 and PCI w/ atherectomy of ostial LAD with DES in 08/1912, chronic systolic heart failure 2/2 ischemic cardiomyopathy (EF 45-50%), osteoarthritis s/p bilateral knee replacement, hx of suspected diverticular bleed in 2013, hemorrhoids s/p banding, multiple hernia repair surgeries who presents to [facility] with rectal bleeding. and admitted for GI bleed.   Plan: #GI bleed Patient has been hemodynamically stable with occasional hypotensive episodes, and hemoglobin is stable as well, most recently at 11.5.  He continues to endorse feeling dizzy on ambulation, however describes this as not changed from his baseline. -GI consulted, appreciate their assistance -Bowel prep today prior to colonoscopy tomorrow, 01/03.  -Transfuse if Hgb less than 7 -Trend CBC   #History of hypertension Blood pressure has been quite labile with some softer pressures recently. Pt is on losartan 100 mg daily at home. Currently holding home metoprolol and spiro as below.  -Continue home losartan 100 mg daily. If continues to be  hypotensive while holding metoprolol and spiro, consider holding losartan.   #Hyperlipidemia Most recent lipid panel on 12/27 remarkable only for elevated triglycerides at 257.  Patient is on rosuvastatin 20 mg daily at home. -Continue rosuvastatin 20 mg daily   #History of urinary retention Currently managed with Flomax 0.4 mg daily, finasteride 5 mg daily. -Continue home Flomax 0.5 mg daily and finasteride 5 mg daily   #Systolic heart failure Managed outpatient with metoprolol 25 mg daily, spironolactone 25 mg daily.  On exam  patient is clinically euvolemic. -Hold metoprolol 12.5 mg twice daily secondary to hypotension -Hold spironolactone 12.5 mg twice daily secondary to hypotension   #History of coronary artery disease Patient has had multiple stents placed, most recently in April 2022.  He has been on Plavix and aspirin since this procedure. -Holding Plavix and aspirin   #Type 2 diabetes mellitus, insulin independent Most recent HbA1c on 12/27 of 7.4%.  The patient states that he does not medically manage his diabetes, just that he and his primary care physician monitor it. -SSI 3 times daily with meals  Best Practice: Diet: Clear liquid diet IVF: Fluids: IV push only, no IV fluids VTE: SCDs Start: 07/30/21 1411 Code: Full AB: None Therapy Recs: Home Health, DME: none DISPO: Anticipated discharge in 2-3 days to Home pending Medical stability and Pending surgery.  Signature: Mitzie Na, M.D. Internal Medicine Resident, PGY-1 [facility]  Pager: [phone] 6:53 AM, 08/02/2021   Please contact the on call pager after 5 pm and on weekends at [phone].

## 2021-08-02 NOTE — Progress Notes (Signed)
Mobility Specialist: Progress Note   08/02/21 1340  Mobility  Activity Ambulated in hall  Level of Assistance Modified independent, requires aide device or extra time  Assistive Device Four wheel walker  Distance Ambulated (ft) 470 ft  Mobility Ambulated with assistance in hallway  Mobility Response Tolerated well  Mobility performed by Mobility specialist  Bed Position Chair  $Mobility charge 1 Mobility   Pre-Mobility: 97 HR, 105/53 (70) BP, 97% SpO2 During Mobility: 130s-150s HR Post-Mobility: 116 HR, 95/81 (88) BP, 98% SpO2  Pt independent with bed mobility as well as to stand. Pt stopped x1 for seated break at the end of ambulation d/t elevated HR. Had pt return to room d/t HR remaining elevated, RN aware. Pt to recliner after walk with call bell and phone in reach.  [facility] Oniel Meleski Mobility Specialist Mobility Specialist 4 [place]: [phone] Mobility Specialist 2 [place]: [phone]

## 2021-08-02 NOTE — Progress Notes (Signed)
Mobility Specialist: Progress Note   08/02/21 1616  Mobility  Activity Ambulated in hall  Level of Assistance Modified independent, requires aide device or extra time  Assistive Device Four wheel walker  Distance Ambulated (ft) 470 ft  Mobility Ambulated with assistance in hallway  Mobility Response Tolerated well  Mobility performed by Mobility specialist  Bed Position Chair  $Mobility charge 1 Mobility   Pre-Mobility: 100s-130s HR, 97 SpO2 During Mobility: 140s-150s HR Post-Mobility: 122 HR, 98% SpO2  Pt had no c/o throughout ambulation. Stopped halfway through ambulation and pt's HR sustaining 140s bpm so had pt return to the room. Pt back to recliner after walk with family present in the room.   [facility] Belay Mobility Specialist Mobility Specialist 4 [place]: [phone] Mobility Specialist 2 [place] and [place]: [phone]

## 2021-08-02 NOTE — Progress Notes (Signed)
Patient ID: John Gamble, male   DOB: 1931-05-10, 88 y.o.   MRN: [id number]    Progress Note   Subjective   Day # 4 CC: Acute lower GI bleeding  Patient in good spirits, no complaints today-small bowel movement this morning which I witnessed in the commode-very small amount of brown stool, no blood  Hemoglobin this a.m. 11.5 down from 12.3 yesterday (14.7 on admission)  Plavix on hold, on baby aspirin  Patient noted on monitor to be having intermittent short bursts of nonsustained V. Tach-few episodes this morning- Asked nursing to get EKG   Objective   Vital signs in last 24 hours: Temp:  [97.4 F (36.3 C)-97.8 F (36.6 C)] 97.4 F (36.3 C) (01/02 0836) Pulse Rate:  [61-85] 61 (01/02 0836) Resp:  [15-20] 20 (01/02 0836) BP: (91-136)/(46-64) 136/58 (01/02 0836) SpO2:  [94 %-98 %] 96 % (01/02 0836) Weight:  [79.8 kg] 79.8 kg (01/02 0400) Last BM Date: 08/01/21 General:   Elderly white male in NAD Heart:  Regular rate and rhythm; no murmurs Lungs: Respirations even and unlabored, lungs CTA bilaterally Abdomen:  Soft, nontender and nondistended. Normal bowel sounds. Extremities:  Without edema. Neurologic:  Alert and oriented,  grossly normal neurologically. Psych:  Cooperative. Normal mood and affect.  Intake/Output from previous day: 01/01 0701 - 01/02 0700 In: 730 [P.O.:730] Out: 850 [Urine:850] Intake/Output this shift: Total I/O In: 0  Out: 200 [Urine:200]  Lab Results: Recent Labs    08/01/21 0202 08/01/21 0526 08/02/21 0456  WBC 9.6 10.7* 7.8  HGB 12.6* 12.3* 11.5*  HCT 35.7* 34.6* 33.9*  PLT 245 231 229   BMET Recent Labs    07/31/21 0321  NA 137  K 3.9  CL 107  CO2 20*  GLUCOSE 155*  BUN 13  CREATININE 0.87  CALCIUM 9.0   LFT No results for input(s): PROT, ALBUMIN, AST, ALT, ALKPHOS, BILITOT, BILIDIR, IBILI in the last 72 hours. PT/INR No results for input(s): LABPROT, INR in the last 72 hours.       Assessment / Plan:    #1 88 yo  WM with acute lower Gi bleed with burgundy appearing stool in setting of aspirin and Plavix Previously documented diverticulosis, think bleeding is consistent with diverticular bleed Brown stool this morning. Hemoglobin has drifted-no transfusion requirement Last colonoscopy over 6 years ago, cannot rule out other lower GI sources of blood loss  #2 coronary artery disease status post LAD stent April 2022-on chronic aspirin and Plavix at home-Plavix on hold since admission, resumed baby aspirin  #3 frequent PVCs and a question several short runs of V. tach this a.m.-patient asymptomatic  #4 history of ischemic cardiomyopathy EF 45 to 50% on Entresto #5 chronic kidney disease stage III #6 hypertension history  Plan; will hold on bowel prep this afternoon, Cardiology consultation, will need clearance prior to any sedation for colonoscopy  Will leave on regular diet today, full liquids in a.m.  Patient is cleared by cardiology and otherwise stable tomorrow then will plan for prep tomorrow and colonoscopy on Wednesday  Continue to follow hemoglobin   Principal Problem:   GI bleed Active Problems:   Coronary artery disease   Hypertension   Hyperlipidemia   Type 2 diabetes mellitus without complication, without long-term current use of insulin (HCC)   Acute on chronic combined systolic and diastolic CHF (congestive heart failure) ([place])     LOS: 2 days   Gildo Crisco EsterwoodPA-C  08/02/2021, 11:22 AM

## 2021-08-03 DIAGNOSIS — Z7902 Long term (current) use of antithrombotics/antiplatelets: Secondary | ICD-10-CM

## 2021-08-03 DIAGNOSIS — I5042 Chronic combined systolic (congestive) and diastolic (congestive) heart failure: Secondary | ICD-10-CM

## 2021-08-03 DIAGNOSIS — K5791 Diverticulosis of intestine, part unspecified, without perforation or abscess with bleeding: Secondary | ICD-10-CM | POA: Diagnosis not present

## 2021-08-03 DIAGNOSIS — K921 Melena: Secondary | ICD-10-CM

## 2021-08-03 DIAGNOSIS — I493 Ventricular premature depolarization: Secondary | ICD-10-CM | POA: Diagnosis not present

## 2021-08-03 DIAGNOSIS — D62 Acute posthemorrhagic anemia: Secondary | ICD-10-CM

## 2021-08-03 LAB — CBC
HCT: 33.5 % — ABNORMAL LOW (ref 39.0–52.0)
Hemoglobin: 11.3 g/dL — ABNORMAL LOW (ref 13.0–17.0)
MCH: 31 pg (ref 26.0–34.0)
MCHC: 33.7 g/dL (ref 30.0–36.0)
MCV: 92 fL (ref 80.0–100.0)
Platelets: 241 10*3/uL (ref 150–400)
RBC: 3.64 MIL/uL — ABNORMAL LOW (ref 4.22–5.81)
RDW: 13.4 % (ref 11.5–15.5)
WBC: 6.7 10*3/uL (ref 4.0–10.5)
nRBC: 0 % (ref 0.0–0.2)

## 2021-08-03 LAB — GLUCOSE, CAPILLARY
Glucose-Capillary: 157 mg/dL — ABNORMAL HIGH (ref 70–99)
Glucose-Capillary: 258 mg/dL — ABNORMAL HIGH (ref 70–99)
Glucose-Capillary: 134 mg/dL — ABNORMAL HIGH (ref 70–99)
Glucose-Capillary: 114 mg/dL — ABNORMAL HIGH (ref 70–99)

## 2021-08-03 MED ORDER — METOCLOPRAMIDE HCL 5 MG/ML IJ SOLN
10.0000 mg | Freq: Four times a day (QID) | INTRAMUSCULAR | Status: AC
  Administered 2021-08-03: 10 mg via INTRAVENOUS
  Filled 2021-08-03: qty 2

## 2021-08-03 MED ORDER — PEG-KCL-NACL-NASULF-NA ASC-C 100 G PO SOLR
0.5000 | Freq: Once | ORAL | Status: AC
  Administered 2021-08-03: 100 g via ORAL
  Filled 2021-08-03: qty 1

## 2021-08-03 MED ORDER — PEG-KCL-NACL-NASULF-NA ASC-C 100 G PO SOLR
0.5000 | Freq: Once | ORAL | Status: AC
Start: 2021-08-03 — End: 2021-08-03
  Administered 2021-08-03: 100 g via ORAL
  Filled 2021-08-03: qty 1

## 2021-08-03 MED ORDER — PEG-KCL-NACL-NASULF-NA ASC-C 100 G PO SOLR: 1.0000 | Freq: Once | ORAL | Status: DC

## 2021-08-03 MED ORDER — BISACODYL 5 MG PO TBEC: 20.0000 mg | DELAYED_RELEASE_TABLET | Freq: Once | ORAL | Status: DC

## 2021-08-03 MED ORDER — BISACODYL 5 MG PO TBEC
20.0000 mg | DELAYED_RELEASE_TABLET | Freq: Once | ORAL | Status: AC
  Administered 2021-08-03: 20 mg via ORAL
  Filled 2021-08-03: qty 4

## 2021-08-03 NOTE — Progress Notes (Signed)
Progress Note  Patient Name: John Gamble Date of Encounter: 08/03/2021  [facility] Cardiologist: Peter [name], MD   Subjective   Patient denies chest pain, palpitations, dizziness/lightheadedness, trouble breathing. Has been walking in the hallways and tolerating it well. Eager to get colonoscopy over with  Inpatient Medications    Scheduled Meds:  aspirin  81 mg Oral Daily   dorzolamide  1 drop Right Eye QHS   finasteride  5 mg Oral Daily   insulin aspart  0-9 Units Subcutaneous TID WC   losartan  25 mg Oral Daily   metoprolol succinate  25 mg Oral Daily   prednisoLONE acetate  1 drop Left Eye Daily   rosuvastatin  20 mg Oral Daily   sodium chloride flush  3 mL Intravenous Q12H   tamsulosin  0.4 mg Oral Daily   Continuous Infusions:  sodium chloride 50 mL/hr at 08/03/21 0700   PRN Meds: acetaminophen **OR** acetaminophen, ondansetron **OR** ondansetron (ZOFRAN) IV   Vital Signs    Vitals:   08/02/21 2314 08/03/21 0300 08/03/21 0334 08/03/21 0808  BP: (!) 104/45  (!) 115/58 (!) 113/59  Pulse: 62  68 65  Resp: 18 20 16 20   Temp: 97.7 F (36.5 C)  97.6 F (36.4 C) (!) 97.5 F (36.4 C)  TempSrc: Oral  Oral Oral  SpO2: 95%  96% 97%  Weight:  85 kg    Height:        Intake/Output Summary (Last 24 hours) at 08/03/2021 0835 Last data filed at 08/03/2021 0700 Gross per 24 hour  Intake 1120 ml  Output 1700 ml  Net -580 ml   Last 3 Weights 08/03/2021 08/02/2021 08/01/2021  Weight (lbs) 187 lb 6.3 oz 176 lb 178 lb 4.8 oz  Weight (kg) 85 kg 79.833 kg 80.876 kg      Telemetry    Sinus rhythm, isolated PVCs, bigeminy, no significant runs of NSVT or VT - Personally Reviewed  ECG    No new tracings - Personally Reviewed  Physical Exam   GEN: No acute distress.   Neck: No JVD Cardiac: RRR, no murmurs, rubs, or gallops.  Respiratory: Clear to auscultation bilaterally. GI: Soft, nontender, non-distended  MS: No edema; No deformity. Neuro:  Nonfocal  Psych:  Normal affect   Labs    High Sensitivity Troponin:  No results for input(s): TROPONINIHS in the last 720 hours.   Chemistry Recent Labs  Lab 07/30/21 1100 07/31/21 0321 08/02/21 0456  NA 135 137 136  K 4.4 3.9 4.3  CL 107 107 104  CO2 23 20* 25  GLUCOSE 187* 155* 168*  BUN 16 13 13   CREATININE 0.95 0.87 1.00  CALCIUM 9.2 9.0 9.0  MG  --   --  2.1  PROT 6.4*  --   --   ALBUMIN 3.9  --   --   AST 19  --   --   ALT 25  --   --   ALKPHOS 73  --   --   BILITOT 1.0  --   --   GFRNONAA >60 >60 >60  ANIONGAP 5 10 7     Lipids No results for input(s): CHOL, TRIG, HDL, LABVLDL, LDLCALC, CHOLHDL in the last 168 hours.  Hematology Recent Labs  Lab 08/01/21 0526 08/02/21 0456 08/03/21 0246  WBC 10.7* 7.8 6.7  RBC 3.89* 3.73* 3.64*  HGB 12.3* 11.5* 11.3*  HCT 34.6* 33.9* 33.5*  MCV 88.9 90.9 92.0  MCH 31.6 30.8 31.0  MCHC  35.5 33.9 33.7  RDW 13.3 13.4 13.4  PLT 231 229 241   Thyroid No results for input(s): TSH, FREET4 in the last 168 hours.  BNPNo results for input(s): BNP, PROBNP in the last 168 hours.  DDimer No results for input(s): DDIMER in the last 168 hours.   Radiology    No results found.  Cardiac Studies   ECHO: 01/19/2021  1. Left ventricular ejection fraction, by estimation, is 45 to 50%. The left ventricle has mildly decreased function. The left ventricle  demonstrates regional wall motion abnormalities (see scoring  diagram/findings for description). Left ventricular diastolic parameters are consistent with Grade I diastolic dysfunction (impaired relaxation). There is severe akinesis of the left ventricular, basal inferior wall. EF has improved significantly since we opened his LAD and have him on good medical therapy. EF from 25% to now 45-50%  2. Right ventricular systolic function is normal. The right ventricular size is normal. There is normal pulmonary artery systolic pressure.   3. Right atrial size was mildly dilated.   4. The mitral valve is  normal in structure. Trivial mitral valve  regurgitation.   5. The aortic valve is tricuspid. Aortic valve regurgitation is mild.   6. There is dilatation of the ascending aorta.    CARDIAC CATH: 10/27/2020 Ost LAD to Prox LAD lesion is 95% stenosed. 1st Diag lesion is 70% stenosed. Previously placed Ost Cx to Prox Cx stent (unknown type) is widely patent. Previously placed LPDA stent (unknown type) is widely patent. Ost RCA to Prox RCA lesion is 90% stenosed. There is severe left ventricular systolic dysfunction. LV end diastolic pressure is normal. The left ventricular ejection fraction is 25-35% by visual estimate.   1. Severe 2 vessel obstructive CAD.    - 90% ostial LAD. Severely calcified. 70% first diagonal    - patent stents in the proximal LCx and left PDA    - 90% ostial nondominant RCA 2. Severe LV dysfunction with regional wall motion abnormalities. 3. Normal LV filling pressures 4. Normal right heart pressures. 5. Preserved cardiac output.    Plan: recommend staged complex  PCI of the ostial LAD with atherectomy and stenting. The diagonal and RCA disease are old and involve small vessels. While CABG would be an option given his advanced age I would favor a percutaneous approach.   CARDIAC CATH/PCI: 11/04/2020  Ost LAD to Prox LAD lesion is 95% stenosed. A drug-eluting stent was successfully placed using a SYNERGY XD [id number]. Post intervention, there is a 0% residual stenosis.   1. Successful PCI of the ostial LAD with IVUS guidance, orbital atherectomy and DES x 1   Diagnostic Dominance: Left Intervention     Patient Profile     88 y.o. male with a hx of DM2, HTN, HLD, CAD w/ BMS CFX 2000, DES LAD w/ atherectomy 10/2020, HFrEF, OA, hernia repair surgeries, hemorrhoids w/ banding, presumed diverticular bleed 2013, who was admitted w/ GIB, and is being seen 08/02/2021 for the evaluation of PVCs/cardiac clearance prior to colonoscopy at the request of Dr  Daryll Drown.  Assessment & Plan    Lower GI bleed -GI has seen and plans colonoscopy for Wednesday 1/4, cleared by cardiology on 1/2  - He has had lower GI bleeding in the past, he was hospitalized in 2013 and the bleeding was thought to be diverticular, but turned out to be hemorrhoidal according to the patient. - He has had banding of his hemorrhoids in the past - He generally feels well, needs  colonoscopy to determine the cause of the bleeding  Arrhythmia - Has a long history of PVCs, there are multiple PVCs on all ECGs in 2022. Likely scar mediated given prior CAD - He is completely asymptomatic with them -- Usually on Toprol 25 mg daily, was originally held in the setting of low BP  - His baseline heart rate was elevated on 1/2 - Decreased patient's dose of losartan to 25 mg daily, restarted toprol 25 mg daily to manage tachycardia and PVCs, tachycardia resolved - Patient was given IV fluids for gentle hydration - His potassium is generally at or greater than 4, magnesium 2.1 - Per Dr. Carlyle Dolly, ok to proceed with GI endoscopy procedures    CAD: - At the time of his cath in March 2022, he had a 90% RCA and 95% proximal LAD. - The LAD was treated with a drug-eluting stent and staged intervention. - The RCA is a small vessel and not felt amenable to PCI - He has been on a beta-blocker and statin. - Because of the bleeding, Plavix is on hold. ASA continued  - Watch for ischemic symptoms, patient continues to deny chest pain on 1/3  HFrEF - He has not been on a diuretic at home. - On Toprol-XL 25 mg  - Dr. [name] prescribed Delene Loll 98-1 03, but the patient is currently on losartan 100 mg daily at home  -Losartan has been cut back to 25 mg daily - He is also on spironolactone 12.5 mg daily, but this is discontinued due to hypotension. - He is not volume overloaded by exam and intake/output is negative by 1.6 L without diuretic use.   For questions or updates, please contact  [place] Please consult [email or web address] for contact info under        Signed, Margie Billet, PA-C  08/03/2021, 8:35 AM

## 2021-08-03 NOTE — Progress Notes (Signed)
HD#3 SUBJECTIVE:  The patient was seen at bedside during rounds this morning.  He is doing well and does not endorse any specific complaints or concerns today.  He understands that he will be getting a bowel prep prior to his colonoscopy tomorrow.  OBJECTIVE:  Vital Signs: Vitals:   08/02/21 2314 08/03/21 0300 08/03/21 0334 08/03/21 0808  BP: (!) 104/45  (!) 115/58 (!) 113/59  Pulse: 62  68 65  Resp: 18 20 16 20   Temp: 97.7 F (36.5 C)  97.6 F (36.4 C) (!) 97.5 F (36.4 C)  TempSrc: Oral  Oral Oral  SpO2: 95%  96% 97%  Weight:  85 kg    Height:       Supplemental O2: Room Air SpO2: 97 %  Filed Weights   08/01/21 0335 08/02/21 0400 08/03/21 0300  Weight: 80.9 kg 79.8 kg 85 kg     Intake/Output Summary (Last 24 hours) at 08/03/2021 0842 Last data filed at 08/03/2021 0700 Gross per 24 hour  Intake 1120 ml  Output 1500 ml  Net -380 ml   Net IO Since Admission: -2,050 mL [08/03/21 0842]  Physical Exam: Constitutional: Chronically ill elderly gentleman in no acute distress. Cardio: Regular rate and rhythm.  No murmurs, rubs, gallops. Pulm: Clear to auscultation bilaterally.  Normal work of breathing on room air. Abdomen: Soft, nontender, nondistended. MSK: Negative for extremity edema.  Distal pulses are normal. Skin: Skin is warm and dry. Neuro: Alert and oriented x3.  No focal deficit noted. Psych: Normal mood and affect.  Patient Lines/Drains/Airways Status     Active Line/Drains/Airways     Name Placement date Placement time Site Days   Peripheral IV 07/30/21 20 G Anterior;Left Forearm 07/30/21  1243  Forearm  2   External Urinary Catheter 07/31/21  1901  --  1   Incision (Closed) 09/19/14 Knee Left 09/19/14  1512  -- 2508             ASSESSMENT/PLAN:  Assessment: Principal Problem:   GI bleed Active Problems:   Coronary artery disease   Hypertension   Hyperlipidemia   Type 2 diabetes mellitus without complication, without long-term current use  of insulin (HCC)   Acute on chronic combined systolic and diastolic CHF (congestive heart failure) ([place])  John Gamble is a 88 y.o. male with a pertinent PMH of hypertension, type II diabetes mellitus, hyperlipidemia, coronary artery disease s/p left circumflex PCI in 2000 and PCI w/ atherectomy of ostial LAD with DES in 0/9470, chronic systolic heart failure 2/2 ischemic cardiomyopathy (EF 45-50%), osteoarthritis s/p bilateral knee replacement, hx of suspected diverticular bleed in 2013, hemorrhoids s/p banding, multiple hernia repair surgeries who presents to [facility] with rectal bleeding. and admitted for GI bleed.   Plan: #GI bleed Patient has been hemodynamically stable with occasional hypotensive episodes, and hemoglobin is stable as well, most recently at 11.3.  He continues to endorse intermittent dizziness with ambulation, however describes this as not changed from his baseline. -GI consulted, appreciate their assistance -Bowel prep today prior to colonoscopy tomorrow, 01/04. -Transfuse if Hgb less than 7 -Trend CBC  #PVCs On 1/2, patient was to receive bowel prep prior to colonoscopy today, however there was a concern the patient was having a few runs of V. tach on telemetry.  We consulted the cardiology team who noted isolated PVCs, bigeminy, couplets.  PVCs were also noted on his prior EKGs. They cleared patient from a cardiology standpoint to proceed with colonoscopy, and they restarted  his metoprolol 25 mg daily. Today, telemetry was reviewed and shows occasional PVCs but no other significant runs of NSVT or VT. We will continue to monitor at this time. -Continue metoprolol 25 mg daily   #History of hypertension Blood pressure has been quite labile with some softer pressures recently. Pt is on losartan 100 mg daily at home. Currently holding home spiro as below.  -Continue home losartan 100 mg daily   #Hyperlipidemia Most recent lipid panel on 12/27 remarkable only for elevated  triglycerides at 257.  Patient is on rosuvastatin 20 mg daily at home. -Continue rosuvastatin 20 mg daily   #History of urinary retention Currently managed with Flomax 0.4 mg daily, finasteride 5 mg daily. -Continue home Flomax 0.5 mg daily and finasteride 5 mg daily   #Systolic heart failure Managed outpatient with metoprolol 25 mg daily, spironolactone 25 mg daily.  On exam patient is clinically euvolemic. -Continue metoprolol 25 mg daily  -Hold spironolactone 12.5 mg twice daily secondary to hypotension   #History of coronary artery disease Patient has had multiple stents placed, most recently in April 2022.  -Holding Plavix, continuing aspirin   #Type 2 diabetes mellitus, insulin independent Most recent HbA1c on 12/27 of 7.4%.  The patient states that he does not medically manage his diabetes, just that he and his primary care physician monitor it. -SSI 3 times daily with meals  Best Practice: Diet: Clear liquid diet IVF: Fluids: IV NS @ 50 mL/hr VTE: SCDs Start: 07/30/21 1411 Code: Full AB: None Therapy Recs: Home Health, DME: none DISPO: Anticipated discharge in 1-2 days to Home pending Medical stability and Pending surgery.  Signature: Mitzie Na, M.D. Internal Medicine Resident, PGY-1 [facility]  Pager: [phone] 8:42 AM, 08/03/2021   Please contact the on call pager after 5 pm and on weekends at [phone].

## 2021-08-03 NOTE — H&P (View-Only) (Signed)
° °         Daily Rounding Note  08/03/2021, 11:52 AM  LOS: 3 days   SUBJECTIVE:   Chief complaint:   Lower GIB.  Presumed diverticular bleed. Feels fine.  Walking in the hallway with PT.  OBJECTIVE:         Vital signs in last 24 hours:    Temp:  [97.4 F (36.3 C)-97.7 F (36.5 C)] 97.4 F (36.3 C) (01/03 1125) Pulse Rate:  [62-117] 74 (01/03 1125) Resp:  [14-20] 20 (01/03 1125) BP: (98-115)/(45-73) 113/73 (01/03 1125) SpO2:  [94 %-97 %] 97 % (01/03 1125) Weight:  [85 kg] 85 kg (01/03 0300) Last BM Date: 08/01/21 Filed Weights   08/01/21 0335 08/02/21 0400 08/03/21 0300  Weight: 80.9 kg 79.8 kg 85 kg   General: Looks well. Heart: RRR. Chest: No labored breathing or cough. Abdomen: Not distended.  Not palpated or auscultated. Extremities: No CCE. Neuro/Psych: Appropriate.  Oriented x3.  No tremors.  Ambulates using walker in the hallway without difficulty.  Intake/Output from previous day: 01/02 0701 - 01/03 0700 In: 1120 [P.O.:520; I.V.:600] Out: 1700 [Urine:1700]  Intake/Output this shift: Total I/O In: 240 [P.O.:240] Out: -   Lab Results: Recent Labs    08/01/21 0526 08/02/21 0456 08/03/21 0246  WBC 10.7* 7.8 6.7  HGB 12.3* 11.5* 11.3*  HCT 34.6* 33.9* 33.5*  PLT 231 229 241   BMET Recent Labs    08/02/21 0456  NA 136  K 4.3  CL 104  CO2 25  GLUCOSE 168*  BUN 13  CREATININE 1.00  CALCIUM 9.0   LFT No results for input(s): PROT, ALBUMIN, AST, ALT, ALKPHOS, BILITOT, BILIDIR, IBILI in the last 72 hours. PT/INR No results for input(s): LABPROT, INR in the last 72 hours. Hepatitis Panel No results for input(s): HEPBSAG, HCVAB, HEPAIGM, HEPBIGM in the last 72 hours.  Studies/Results: No results found.  ASSESMENT:   Painless hematochezia.  Presumed diverticular bleed.  Hx hemorrhoidal bleeding and banding.  Previous colonoscopy > 6 years ago, Dr. Earlean Shawl.  CAD.  DES placed to LAD 10/2020, on  Plavix/ASA since.  Currently these are on hold, last Plavix 12/30.  LVEF 45 to 50%.  Runs of non-sustained vtach.  Per Dr Carlyle Dolly, cards, ok to proceed w EGD/Colonoscopy.     PLAN     1/4/232 colon/EGD 230 PM.   Start clears and prep today.      Azucena Freed  08/03/2021, 11:52 AM Phone [phone]  GI ATTENDING  Interval history data reviewed.  Patient personally seen and examined.  Agree with comprehensive progress note as outlined above.  Case discussed with Dr. Fuller Plan as well.  The patient remains clinically stable without active bleeding.  He is completing Plavix washout.  Plans tomorrow for colonoscopy and upper endoscopy, tomorrow afternoon, to evaluate GI bleeding--as he will require chronic antiplatelet therapy.  Patient is high risk given his age and comorbidities.  Docia Chuck. Geri Seminole., M.D. [facility] Division of Gastroenterology

## 2021-08-03 NOTE — Progress Notes (Signed)
Mobility Specialist: Progress Note   08/03/21 1217  Mobility  Activity Ambulated in hall  Level of Assistance Minimal assist, patient does 75% or more  Assistive Device Four wheel walker  Distance Ambulated (ft) 940 ft  Mobility Ambulated with assistance in hallway  Mobility Response Tolerated well  Mobility performed by Mobility specialist  Bed Position Chair  $Mobility charge 1 Mobility   Pre-Mobility: 82 HR During Mobility: 117 HR Post-Mobility: 103 HR  Pt had no c/o throughout ambulation. Pt to recliner after walk with call bell and phone in reach. Will f/u later today for second bout of ambulation.   [facility] [place] Mittman Mobility Specialist Mobility Specialist 4 [place]: [phone] Mobility Specialist 2 [place] and [place]: [phone]

## 2021-08-03 NOTE — Progress Notes (Signed)
Mobility Specialist: Progress Note   08/03/21 1755  Mobility  Activity Refused mobility   Pt refused mobility d/t frequently getting up to [facility]. Will f/u tomorrow.   [facility] Dalisa Forrer Mobility Specialist Mobility Specialist 4 [place]: [phone] Mobility Specialist 2 [place] and [place]: [phone]

## 2021-08-03 NOTE — Progress Notes (Addendum)
° °         Daily Rounding Note  08/03/2021, 11:52 AM  LOS: 3 days   SUBJECTIVE:   Chief complaint:   Lower GIB.  Presumed diverticular bleed. Feels fine.  Walking in the hallway with PT.  OBJECTIVE:         Vital signs in last 24 hours:    Temp:  [97.4 F (36.3 C)-97.7 F (36.5 C)] 97.4 F (36.3 C) (01/03 1125) Pulse Rate:  [62-117] 74 (01/03 1125) Resp:  [14-20] 20 (01/03 1125) BP: (98-115)/(45-73) 113/73 (01/03 1125) SpO2:  [94 %-97 %] 97 % (01/03 1125) Weight:  [85 kg] 85 kg (01/03 0300) Last BM Date: 08/01/21 Filed Weights   08/01/21 0335 08/02/21 0400 08/03/21 0300  Weight: 80.9 kg 79.8 kg 85 kg   General: Looks well. Heart: RRR. Chest: No labored breathing or cough. Abdomen: Not distended.  Not palpated or auscultated. Extremities: No CCE. Neuro/Psych: Appropriate.  Oriented x3.  No tremors.  Ambulates using walker in the hallway without difficulty.  Intake/Output from previous day: 01/02 0701 - 01/03 0700 In: 1120 [P.O.:520; I.V.:600] Out: 1700 [Urine:1700]  Intake/Output this shift: Total I/O In: 240 [P.O.:240] Out: -   Lab Results: Recent Labs    08/01/21 0526 08/02/21 0456 08/03/21 0246  WBC 10.7* 7.8 6.7  HGB 12.3* 11.5* 11.3*  HCT 34.6* 33.9* 33.5*  PLT 231 229 241   BMET Recent Labs    08/02/21 0456  NA 136  K 4.3  CL 104  CO2 25  GLUCOSE 168*  BUN 13  CREATININE 1.00  CALCIUM 9.0   LFT No results for input(s): PROT, ALBUMIN, AST, ALT, ALKPHOS, BILITOT, BILIDIR, IBILI in the last 72 hours. PT/INR No results for input(s): LABPROT, INR in the last 72 hours. Hepatitis Panel No results for input(s): HEPBSAG, HCVAB, HEPAIGM, HEPBIGM in the last 72 hours.  Studies/Results: No results found.  ASSESMENT:   Painless hematochezia.  Presumed diverticular bleed.  Hx hemorrhoidal bleeding and banding.  Previous colonoscopy > 6 years ago, Dr. Earlean Shawl.  CAD.  DES placed to LAD 10/2020, on  Plavix/ASA since.  Currently these are on hold, last Plavix 12/30.  LVEF 45 to 50%.  Runs of non-sustained vtach.  Per Dr Carlyle Dolly, cards, ok to proceed w EGD/Colonoscopy.     PLAN     1/4/232 colon/EGD 230 PM.   Start clears and prep today.      Azucena Freed  08/03/2021, 11:52 AM Phone [phone]  GI ATTENDING  Interval history data reviewed.  Patient personally seen and examined.  Agree with comprehensive progress note as outlined above.  Case discussed with Dr. Fuller Plan as well.  The patient remains clinically stable without active bleeding.  He is completing Plavix washout.  Plans tomorrow for colonoscopy and upper endoscopy, tomorrow afternoon, to evaluate GI bleeding--as he will require chronic antiplatelet therapy.  Patient is high risk given his age and comorbidities.  Docia Chuck. Geri Seminole., M.D. [facility] Division of Gastroenterology

## 2021-08-04 ENCOUNTER — Encounter (HOSPITAL_COMMUNITY): Payer: Self-pay | Admitting: Internal Medicine

## 2021-08-04 ENCOUNTER — Inpatient Hospital Stay (HOSPITAL_COMMUNITY): Payer: Medicare Other | Admitting: Certified Registered Nurse Anesthetist

## 2021-08-04 ENCOUNTER — Encounter (HOSPITAL_COMMUNITY)
Admission: EM | Disposition: A | Discharge status: Home / Self Care | Payer: Self-pay | Source: Home / Self Care | Attending: Internal Medicine

## 2021-08-04 DIAGNOSIS — K5731 Diverticulosis of large intestine without perforation or abscess with bleeding: Principal | ICD-10-CM

## 2021-08-04 DIAGNOSIS — K2990 Gastroduodenitis, unspecified, without bleeding: Secondary | ICD-10-CM

## 2021-08-04 DIAGNOSIS — K2970 Gastritis, unspecified, without bleeding: Secondary | ICD-10-CM

## 2021-08-04 DIAGNOSIS — K5791 Diverticulosis of intestine, part unspecified, without perforation or abscess with bleeding: Secondary | ICD-10-CM | POA: Diagnosis not present

## 2021-08-04 HISTORY — PX: COLONOSCOPY WITH PROPOFOL: SHX5780

## 2021-08-04 HISTORY — PX: ESOPHAGOGASTRODUODENOSCOPY: SHX5428

## 2021-08-04 HISTORY — PX: BIOPSY: SHX5522

## 2021-08-04 LAB — CBC
HCT: 33.3 % — ABNORMAL LOW (ref 39.0–52.0)
Hemoglobin: 11.7 g/dL — ABNORMAL LOW (ref 13.0–17.0)
MCH: 32.2 pg (ref 26.0–34.0)
MCHC: 35.1 g/dL (ref 30.0–36.0)
MCV: 91.7 fL (ref 80.0–100.0)
Platelets: 246 10*3/uL (ref 150–400)
RBC: 3.63 MIL/uL — ABNORMAL LOW (ref 4.22–5.81)
RDW: 13.5 % (ref 11.5–15.5)
WBC: 7.5 10*3/uL (ref 4.0–10.5)
nRBC: 0 % (ref 0.0–0.2)

## 2021-08-04 LAB — GLUCOSE, CAPILLARY
Glucose-Capillary: 161 mg/dL — ABNORMAL HIGH (ref 70–99)
Glucose-Capillary: 187 mg/dL — ABNORMAL HIGH (ref 70–99)
Glucose-Capillary: 142 mg/dL — ABNORMAL HIGH (ref 70–99)
Glucose-Capillary: 157 mg/dL — ABNORMAL HIGH (ref 70–99)

## 2021-08-04 LAB — BASIC METABOLIC PANEL
Anion gap: 8 (ref 5–15)
BUN: 10 mg/dL (ref 8–23)
CO2: 19 mmol/L — ABNORMAL LOW (ref 22–32)
Calcium: 8.7 mg/dL — ABNORMAL LOW (ref 8.9–10.3)
Chloride: 110 mmol/L (ref 98–111)
Creatinine, Ser: 0.94 mg/dL (ref 0.61–1.24)
GFR, Estimated: >60 mL/min (ref >60)
Glucose, Bld: 180 mg/dL — ABNORMAL HIGH (ref 70–99)
Potassium: 4 mmol/L (ref 3.5–5.1)
Sodium: 137 mmol/L (ref 135–145)

## 2021-08-04 SURGERY — COLONOSCOPY WITH PROPOFOL
Anesthesia: Monitor Anesthesia Care

## 2021-08-04 MED ORDER — LIDOCAINE 2% (20 MG/ML) 5 ML SYRINGE
INTRAMUSCULAR | Status: DC | PRN
  Administered 2021-08-04: 40 mg via INTRAVENOUS

## 2021-08-04 MED ORDER — PANTOPRAZOLE SODIUM 40 MG PO TBEC
40.0000 mg | DELAYED_RELEASE_TABLET | Freq: Every day | ORAL | Status: DC
  Administered 2021-08-05: 40 mg via ORAL
  Filled 2021-08-04: qty 1

## 2021-08-04 MED ORDER — PROPOFOL 10 MG/ML IV BOLUS
INTRAVENOUS | Status: DC | PRN
  Administered 2021-08-04: 20 mg via INTRAVENOUS

## 2021-08-04 MED ORDER — PHENYLEPHRINE HCL-NACL 20-0.9 MG/250ML-% IV SOLN
INTRAVENOUS | Status: DC | PRN
Start: 2021-08-04 — End: 2021-08-04
  Administered 2021-08-04: 75 ug/min via INTRAVENOUS

## 2021-08-04 MED ORDER — PROPOFOL 500 MG/50ML IV EMUL
INTRAVENOUS | Status: DC | PRN
Start: 2021-08-04 — End: 2021-08-04
  Administered 2021-08-04: 75 ug/kg/min via INTRAVENOUS

## 2021-08-04 MED ORDER — EPHEDRINE SULFATE-NACL 50-0.9 MG/10ML-% IV SOSY
PREFILLED_SYRINGE | INTRAVENOUS | Status: DC | PRN
  Administered 2021-08-04: 10 mg via INTRAVENOUS

## 2021-08-04 SURGICAL SUPPLY — 22 items

## 2021-08-04 NOTE — Op Note (Signed)
[facility] Patient Name: John Gamble Procedure Date : 08/04/2021 MRN: [id number] Attending MD: Docia Chuck. Henrene Pastor , MD Date of Birth: 1931/01/04 CSN: [id number] Age: 88 Admit Type: Inpatient Procedure:                Colonoscopy Indications:              Hematochezia Providers:                Docia Chuck. Henrene Pastor, MD, Jaci Carrel, RN, Lodema Hong Technician, Technician Referring MD:              Medicines:                Monitored Anesthesia Care Complications:            No immediate complications. Estimated blood loss:                            None. Estimated Blood Loss:     Estimated blood loss: none. Procedure:                Pre-Anesthesia Assessment:                           - Prior to the procedure, a History and Physical                            was performed, and patient medications and                            allergies were reviewed. The patient's tolerance of                            previous anesthesia was also reviewed. The risks                            and benefits of the procedure and the sedation                            options and risks were discussed with the patient.                            All questions were answered, and informed consent                            was obtained. Prior Anticoagulants: The patient has                            taken Plavix (clopidogrel), last dose was 6 days                            prior to procedure. ASA Grade Assessment: III - A                            patient  with severe systemic disease. After                            reviewing the risks and benefits, the patient was                            deemed in satisfactory condition to undergo the                            procedure.                           After obtaining informed consent, the colonoscope                            was passed under direct vision. Throughout the                            procedure, the  patient's blood pressure, pulse, and                            oxygen saturations were monitored continuously. The                            [id number]-HQ190L ([id number]) Olympus coloscope was                            introduced through the anus and advanced to the the                            cecum, identified by appendiceal orifice and                            ileocecal valve. The ileocecal valve, appendiceal                            orifice, and rectum were photographed. The quality                            of the bowel preparation was excellent. The                            colonoscopy was performed without difficulty. The                            patient tolerated the procedure well. The bowel                            preparation used was SUPREP via split dose                            instruction. Scope In: 2:52:16 PM Scope Out: 3:03:57 PM Scope Withdrawal Time: 0 hours 9 minutes 51 seconds  Total Procedure Duration: 0 hours 11 minutes 41 seconds  Findings:  Many small and large-mouthed diverticula were found in the entire colon.       The changes were most severe in the left colon. There was no active       bleeding or blood in the colon.      The exam was otherwise without abnormality on direct and retroflexion       views.. Retroflexed image did not capture but excellent view from anal       os captured. Impression:               1. Diverticulosis in the entire examined colon.                           2. The examination was otherwise normal on direct                            and retroflexion views.                           3. Felt to have had self-limited diverticular bleed. Recommendation:           - Repeat colonoscopy is not recommended for                            surveillance.                           - Resume Plavix (clopidogrel) today at prior dose.                           - Patient has a contact number available for                             emergencies. The signs and symptoms of potential                            delayed complications were discussed with the                            patient. Return to normal activities tomorrow.                            Written discharge instructions were provided to the                            patient.                           - Resume previous diet.                           - Continue present medications.                           - EGD today. See report Procedure Code(s):        --- Professional ---  45378, Colonoscopy, flexible; diagnostic, including                            collection of specimen(s) by brushing or washing,                            when performed (separate procedure) Diagnosis Code(s):        --- Professional ---                           K92.1, Melena (includes Hematochezia)                           K57.30, Diverticulosis of large intestine without                            perforation or abscess without bleeding CPT copyright 2019 American Medical Association. All rights reserved. The codes documented in this report are preliminary and upon coder review may  be revised to meet current compliance requirements. Docia Chuck. Henrene Pastor, MD 08/04/2021 3:10:20 PM This report has been signed electronically. Number of Addenda: 0

## 2021-08-04 NOTE — Plan of Care (Signed)
?  Problem: Clinical Measurements: ?Goal: Will remain free from infection ?Outcome: Progressing ?Goal: Diagnostic test results will improve ?Outcome: Progressing ?Goal: Respiratory complications will improve ?Outcome: Progressing ?  ?

## 2021-08-04 NOTE — Progress Notes (Signed)
PT Cancellation Note  Patient Details Name: WINFERD WEASE MRN: [id number] DOB: 05-Dec-1930   Cancelled Treatment:    Reason Eval/Treat Not Completed: Other (comment). Pt politely declining participation in therapy due to pending colonoscopy today.   Lorriane Shire 08/04/2021, 10:29 AM  Lorrin Goodell, PT  Office # [phone] Pager [phone]

## 2021-08-04 NOTE — Op Note (Signed)
[facility] Patient Name: John Gamble Procedure Date : 08/04/2021 MRN: [id number] Attending MD: Docia Chuck. Henrene Pastor , MD Date of Birth: [birth year] CSN: [id number] Age: 88 Admit Type: Inpatient Procedure:                Upper GI endoscopy with biopsies Indications:              Hematochezia Providers:                Docia Chuck. Henrene Pastor, MD, Jaci Carrel, RN, Lodema Hong Technician, Technician Referring MD:             [facility] hospitalist Medicines:                Monitored Anesthesia Care Complications:            No immediate complications. Estimated Blood Loss:     Estimated blood loss: none. Procedure:                Pre-Anesthesia Assessment:                           - Prior to the procedure, a History and Physical                            was performed, and patient medications and                            allergies were reviewed. The patient's tolerance of                            previous anesthesia was also reviewed. The risks                            and benefits of the procedure and the sedation                            options and risks were discussed with the patient.                            All questions were answered, and informed consent                            was obtained. Prior Anticoagulants: The patient has                            taken Plavix (clopidogrel), last dose was 6 days                            prior to procedure. ASA Grade Assessment: III - A                            patient with severe systemic disease. After  reviewing the risks and benefits, the patient was                            deemed in satisfactory condition to undergo the                            procedure.                           After obtaining informed consent, the endoscope was                            passed under direct vision. Throughout the                            procedure, the patient's blood  pressure, pulse, and                            oxygen saturations were monitored continuously. The                            [id number]-[id number] ([id number]) Olympus endoscope was introduced                            through the mouth, and advanced to the second part                            of duodenum. The upper GI endoscopy was                            accomplished without difficulty. The patient                            tolerated the procedure well. Scope In: Scope Out: Findings:      The esophagus revealed mild reflux esophagitis as manifested by erythema       and edema in the region of the mucosal Z-line.      The esophagus was otherwise normal normal.      The stomach revealed a few tiny superficial erosions otherwise normal.       Biopsies were taken with a cold forceps for histology.      The examined duodenum also revealed superficial erosions of the bulb and       second portion.      The cardia and gastric fundus were normal on retroflexion, save small       hiatal hernia. Impression:               1. Mild reflux esophagitis                           2. Mild erosive gastroduodenitis                           3. Otherwise normal EGD. Moderate Sedation:      none Recommendation:           1. Resume previous diet  2. Resume previous medications                           3. Okay to resume Plavix today                           4. I would recommend a daily PPI for this patient                            given the findings on upper endoscopy as well as                            his age and chronic dual antiplatelet therapies (to                            reduce the risk of ulcer formation). Please send                            him out on pantoprazole 40 mg daily                           5. See colonoscopy report                           6. Okay for discharge from GI perspective. No                            outpatient GI follow-up is required. We  will sign                            off.                           . Procedure Code(s):        --- Professional ---                           [phone], Esophagogastroduodenoscopy, flexible,                            transoral; with biopsy, single or multiple Diagnosis Code(s):        --- Professional ---                           K20.90, Esophagitis, unspecified without bleeding                           K92.1, Melena (includes Hematochezia) CPT copyright 2019 American Medical Association. All rights reserved. The codes documented in this report are preliminary and upon coder review may  be revised to meet current compliance requirements. Docia Chuck. Henrene Pastor, MD 08/04/2021 3:28:12 PM This report has been signed electronically. Number of Addenda: 0

## 2021-08-04 NOTE — Interval H&P Note (Signed)
History and Physical Interval Note:  08/04/2021 2:10 PM  John Gamble  has presented today for surgery, with the diagnosis of Painless hematochezia..  The various methods of treatment have been discussed with the patient and family. After consideration of risks, benefits and other options for treatment, the patient has consented to  Procedure(s): COLONOSCOPY WITH PROPOFOL (N/A) ESOPHAGOGASTRODUODENOSCOPY (EGD) (N/A) as a surgical intervention.  The patient's history has been reviewed, patient examined, no change in status, stable for surgery.  I have reviewed the patient's chart and labs.  Questions were answered to the patient's satisfaction.     Scarlette Shorts

## 2021-08-04 NOTE — Progress Notes (Signed)
HD#4 SUBJECTIVE:  The patient was seen at bedside during rounds this morning.  He endorses frequent stools yesterday following bowel prep.  He is doing well and does not endorse any specific complaints or concerns today.    OBJECTIVE:  Vital Signs: Vitals:   08/03/21 1956 08/03/21 2329 08/04/21 0500 08/04/21 0513  BP: (!) 108/49 (!) 127/50  (!) 115/56  Pulse: 98 75  71  Resp: 20 17  18   Temp: 98.1 F (36.7 C) 97.8 F (36.6 C)  (!) 97.4 F (36.3 C)  TempSrc: Oral Axillary  Oral  SpO2: 98% 93%  97%  Weight:   83 kg   Height:       Supplemental O2: Room Air SpO2: 97 %  Filed Weights   08/02/21 0400 08/03/21 0300 08/04/21 0500  Weight: 79.8 kg 85 kg 83 kg    Intake/Output Summary (Last 24 hours) at 08/04/2021 0720 Last data filed at 08/04/2021 8938 Gross per 24 hour  Intake 1419.91 ml  Output 1600 ml  Net -180.09 ml    Net IO Since Admission: -2,230.09 mL [08/04/21 0720]  Physical Exam: Constitutional: Chronically ill elderly gentleman in no acute distress. Cardio: Regular rate and rhythm. No murmurs, rubs, gallops. Pulm: Clear to auscultation bilaterally.  Normal work of breathing on room air. Abdomen: Soft, nontender, nondistended. MSK: Negative for extremity edema. Distal pulses are normal. Skin: Skin is warm and dry. Neuro: Alert and oriented x3.  No focal deficit noted. Psych: Normal mood and affect.  Patient Lines/Drains/Airways Status     Active Line/Drains/Airways     Name Placement date Placement time Site Days   Peripheral IV 07/30/21 20 G Anterior;Left Forearm 07/30/21  1243  Forearm  2   External Urinary Catheter 07/31/21  1901  --  1   Incision (Closed) 09/19/14 Knee Left 09/19/14  1512  -- 2508             ASSESSMENT/PLAN:  Assessment: Principal Problem:   GI bleed Active Problems:   Coronary artery disease   Hypertension   Hyperlipidemia   Type 2 diabetes mellitus without complication, without long-term current use of insulin (HCC)    Acute on chronic combined systolic and diastolic CHF (congestive heart failure) (HCC)   PVC's (premature ventricular contractions)   Platelet inhibition due to Plavix   Hematochezia   Acute blood loss anemia  John Gamble is a 88 y.o. male with a pertinent PMH of hypertension, type II diabetes mellitus, hyperlipidemia, coronary artery disease s/p left circumflex PCI in 2000 and PCI w/ atherectomy of ostial LAD with DES in 08/173, chronic systolic heart failure 2/2 ischemic cardiomyopathy (EF 45-50%), osteoarthritis s/p bilateral knee replacement, hx of suspected diverticular bleed in 2013, hemorrhoids s/p banding, multiple hernia repair surgeries who presents to [facility] with rectal bleeding. and admitted for a lower GI bleed.   Plan: #Lower GI bleed Patient has been hemodynamically stable with occasional hypotensive episodes, and hemoglobin is stable as well, most recently at 11.3.  He continues to endorse intermittent dizziness with ambulation, however describes this as unchanged from his baseline. -GI consulted, appreciate their assistance -Plan for colonoscopy today, 1/4 at 2:30 PM -Transfuse if Hgb less than 7 -Trend CBC  #PVCs On 1/2, patient was to receive bowel prep prior to colonoscopy, however there was a concern the patient was having a few runs of V. tach on telemetry.  We consulted the cardiology team who noted isolated PVCs, bigeminy, couplets. PVCs were also noted on his prior  EKGs. They cleared patient from a cardiology standpoint to proceed with colonoscopy, and they restarted his metoprolol 25 mg daily.  No runs of V. tach noticed on telemetry today. We will continue to monitor at this time. -Cardiology on board, appreciate their assistance -Continue metoprolol 25 mg daily   #History of hypertension Blood pressure has been quite labile with some softer pressures recently. Pt is on losartan 100 mg daily at home. Currently holding home spiro as below.  -Continue home losartan 100 mg  daily   #Hyperlipidemia Most recent lipid panel on 12/27 remarkable only for elevated triglycerides at 257.  Patient is on rosuvastatin 20 mg daily at home. -Continue rosuvastatin 20 mg daily   #History of urinary retention Currently managed with Flomax 0.4 mg daily, finasteride 5 mg daily. -Continue home Flomax 0.5 mg daily and finasteride 5 mg daily   #Systolic heart failure Managed outpatient with metoprolol 25 mg daily, spironolactone 25 mg daily. On exam patient is clinically euvolemic.  -Continue metoprolol 25 mg daily  -Hold spironolactone 12.5 mg twice daily secondary to hypotension   #History of coronary artery disease Patient has had multiple stents placed, most recently in April 2022.  -Holding Plavix, continuing aspirin   #Type 2 diabetes mellitus, insulin independent Most recent HbA1c on 12/27 of 7.4%. The patient states that he does not medically manage his diabetes, just that he and his primary care physician monitor it. -SSI 3 times daily with meals  Best Practice: Diet: Clear liquid diet IVF: Fluids: IV NS @ 50 mL/hr VTE: SCDs Start: 07/30/21 1411 Code: Full AB: None Therapy Recs: Home Health, DME: none DISPO: Anticipated discharge in 1-2 days to Home pending Medical stability and Pending colonoscopy  Signature: Mitzie Na, M.D. Internal Medicine Resident, PGY-1 [facility]  Pager: [phone] 7:20 AM, 08/04/2021   Please contact the on call pager after 5 pm and on weekends at [phone].

## 2021-08-04 NOTE — Care Management Important Message (Signed)
Important Message  Patient Details  Name: John Gamble MRN: [id number] Date of Birth: [birth year]   Medicare Important Message Given:  Yes     [name] 08/04/2021, 9:21 AM

## 2021-08-04 NOTE — Progress Notes (Signed)
Physical Therapy Treatment Patient Details Name: John Gamble MRN: [id number] DOB: 01-21-1931 Today's Date: 08/04/2021   History of Present Illness John Gamble is a 88 y.o. male, who presented to the ED 12/30 after multiple episodes of bloody stool;  probable lower GI bleed. PMHx: CAD, HLD, HTN, OA, MI    PT Comments    After OT session, pt agreeable to hallway ambulation with PT. Pt required min guard assist transfers, and supervision ambulation 1000' with rollator. Pt returned to recliner at end of session.    Recommendations for follow up therapy are one component of a multi-disciplinary discharge planning process, led by the attending physician.  Recommendations may be updated based on patient status, additional functional criteria and insurance authorization.  Follow Up Recommendations  Outpatient PT (Has been to [facility] in the past, needs approval per cardiologist)     Assistance Recommended at Discharge Intermittent Supervision/Assistance  Patient can return home with the following     Equipment Recommendations  None recommended by PT    Recommendations for Other Services       Precautions / Restrictions Precautions Precautions: Fall;Other (comment) Precaution Comments: watch HR Restrictions Weight Bearing Restrictions: No     Mobility  Bed Mobility               General bed mobility comments: received in recliner    Transfers Overall transfer level: Needs assistance Equipment used: Rolling walker (2 wheels) Transfers: Sit to/from Stand Sit to Stand: Min guard           General transfer comment: increased time to power up    Ambulation/Gait Ambulation/Gait assistance: Supervision Gait Distance (Feet): 1000 Feet Assistive device: Rollator (4 wheels) Gait Pattern/deviations: Step-through pattern Gait velocity: WFL Gait velocity interpretation: >2.62 ft/sec, indicative of community ambulatory   General Gait Details: steady gait with  rollator   Stairs             Wheelchair Mobility    Modified Rankin (Stroke Patients Only)       Balance Overall balance assessment: Needs assistance Sitting-balance support: Feet supported;No upper extremity supported Sitting balance-Leahy Scale: Good     Standing balance support: Single extremity supported;Bilateral upper extremity supported;During functional activity Standing balance-Leahy Scale: Fair                              Cognition Arousal/Alertness: Awake/alert Behavior During Therapy: WFL for tasks assessed/performed Overall Cognitive Status: Within Functional Limits for tasks assessed                                          Exercises      General Comments        Pertinent Vitals/Pain Pain Assessment: No/denies pain    Home Living                          Prior Function            PT Goals (current goals can now be found in the care plan section) Acute Rehab PT Goals Patient Stated Goal: home Progress towards PT goals: Progressing toward goals    Frequency    Min 3X/week      PT Plan Current plan remains appropriate    Co-evaluation  AM-PAC PT "6 Clicks" Mobility   Outcome Measure  Help needed turning from your back to your side while in a flat bed without using bedrails?: None Help needed moving from lying on your back to sitting on the side of a flat bed without using bedrails?: None Help needed moving to and from a bed to a chair (including a wheelchair)?: A Little Help needed standing up from a chair using your arms (e.g., wheelchair or bedside chair)?: A Little Help needed to walk in hospital room?: A Little Help needed climbing 3-5 steps with a railing? : A Little 6 Click Score: 20    End of Session Equipment Utilized During Treatment: Gait belt Activity Tolerance: Patient tolerated treatment well Patient left: in chair;with call bell/phone within  reach Nurse Communication: Mobility status PT Visit Diagnosis: Other abnormalities of gait and mobility (R26.89);Unsteadiness on feet (R26.81);History of falling (Z91.81)     Time: 1130-1150 PT Time Calculation (min) (ACUTE ONLY): 20 min  Charges:  $Gait Training: 23-37 mins                     Lorrin Goodell, [place]  Office # [phone] Pager [phone]    Lorriane Shire 08/04/2021, 12:40 PM

## 2021-08-04 NOTE — Transfer of Care (Signed)
Immediate Anesthesia Transfer of Care Note  Patient: John Gamble  Procedure(s) Performed: COLONOSCOPY WITH PROPOFOL ESOPHAGOGASTRODUODENOSCOPY (EGD) BIOPSY  Patient Location: Endoscopy Unit  Anesthesia Type:MAC  Level of Consciousness: drowsy and patient cooperative  Airway & Oxygen Therapy: Patient Spontanous Breathing and Patient connected to nasal cannula oxygen  Post-op Assessment: Report given to RN and Post -op Vital signs reviewed and stable  Post vital signs: Reviewed and stable  Last Vitals:  Vitals Value Taken Time  BP 105/56 08/04/21 1522  Temp    Pulse 61 08/04/21 1523  Resp 14 08/04/21 1523  SpO2 100 % 08/04/21 1523  Vitals shown include unvalidated device data.  Last Pain:  Vitals:   08/04/21 1420  TempSrc: Temporal  PainSc: 0-No pain         Complications: No notable events documented.

## 2021-08-04 NOTE — Anesthesia Preprocedure Evaluation (Signed)
Anesthesia Evaluation  Patient identified by MRN, date of birth, ID band Patient awake    Reviewed: Allergy & Precautions, H&P , NPO status , Patient's Chart, lab work & pertinent test results  Airway Mallampati: II   Neck ROM: full    Dental   Pulmonary former smoker,    breath sounds clear to auscultation       Cardiovascular hypertension, + CAD, + Past MI, + Cardiac Stents and +CHF   Rhythm:regular Rate:Normal     Neuro/Psych    GI/Hepatic   Endo/Other  diabetes, Type 2  Renal/GU      Musculoskeletal  (+) Arthritis ,   Abdominal   Peds  Hematology   Anesthesia Other Findings   Reproductive/Obstetrics                             Anesthesia Physical Anesthesia Plan  ASA: 3  Anesthesia Plan: MAC   Post-op Pain Management:    Induction: Intravenous  PONV Risk Score and Plan: 1 and Propofol infusion and Treatment may vary due to age or medical condition  Airway Management Planned: Nasal Cannula  Additional Equipment:   Intra-op Plan:   Post-operative Plan:   Informed Consent: I have reviewed the patients History and Physical, chart, labs and discussed the procedure including the risks, benefits and alternatives for the proposed anesthesia with the patient or authorized representative who has indicated his/her understanding and acceptance.     Dental advisory given  Plan Discussed with: CRNA, Anesthesiologist and Surgeon  Anesthesia Plan Comments:         Anesthesia Quick Evaluation

## 2021-08-04 NOTE — Progress Notes (Signed)
Occupational Therapy Treatment Patient Details Name: John Gamble MRN: [id number] DOB: 04-30-1931 Today's Date: 08/04/2021   History of present illness John Gamble is a 88 y.o. male, who presented to the ED 12/30 after multiple episodes of bloody stool;  probable lower GI bleed. PMHx: CAD, HLD, HTN, OA, MI   OT comments  Patient continues to make steady progress towards goals in skilled OT session. Patient's session encompassed morning ADL routine standing at the sink, and minimal functional ambulation to complete household distances. Patient with VSS throughout session, and able to complete grooming routine (upwards of 7 minutes at sink) without need for seated rest break. Continue with current discharge recommendations, therapy will continue to follow.    Recommendations for follow up therapy are one component of a multi-disciplinary discharge planning process, led by the attending physician.  Recommendations may be updated based on patient status, additional functional criteria and insurance authorization.    Follow Up Recommendations  Outpatient OT    Assistance Recommended at Discharge Intermittent Supervision/Assistance  Patient can return home with the following      Equipment Recommendations  BSC/3in1    Recommendations for Other Services      Precautions / Restrictions Precautions Precautions: Fall;Other (comment) Precaution Comments: watch HR Restrictions Weight Bearing Restrictions: No       Mobility Bed Mobility Overal bed mobility: Needs Assistance Bed Mobility: Supine to Sit     Supine to sit: Modified independent (Device/Increase time)     General bed mobility comments: received in recliner    Transfers Overall transfer level: Needs assistance Equipment used: Rolling walker (2 wheels) Transfers: Sit to/from Stand Sit to Stand: Min guard           General transfer comment: increased time to power up     Balance Overall balance assessment: Needs  assistance Sitting-balance support: Feet supported;No upper extremity supported Sitting balance-Leahy Scale: Good     Standing balance support: Single extremity supported;Bilateral upper extremity supported;During functional activity Standing balance-Leahy Scale: Fair                             ADL either performed or assessed with clinical judgement   ADL Overall ADL's : Needs assistance/impaired     Grooming: Min guard;Standing;Wash/dry hands;Wash/dry face;Oral care;Applying deodorant;Brushing hair                   Toilet Transfer: Rolling walker (2 wheels);Ambulation;Min guard Toilet Transfer Details (indicate cue type and reason): simulated with ambulation to the recliner Toileting- Clothing Manipulation and Hygiene: Supervision/safety;Sit to/from stand       Functional mobility during ADLs: Min guard;Rolling walker (2 wheels) General ADL Comments: stable HR with all movement in session, no dizziness    Extremity/Trunk Assessment              Vision       Perception     Praxis      Cognition Arousal/Alertness: Awake/alert Behavior During Therapy: WFL for tasks assessed/performed Overall Cognitive Status: Within Functional Limits for tasks assessed                                            Exercises     Shoulder Instructions       General Comments      Pertinent Vitals/ Pain  Pain Assessment: No/denies pain  Home Living                                          Prior Functioning/Environment              Frequency  Min 2X/week        Progress Toward Goals  OT Goals(current goals can now be found in the care plan section)  Progress towards OT goals: Progressing toward goals  Acute Rehab OT Goals Patient Stated Goal: to go home OT Goal Formulation: With patient Time For Goal Achievement: 08/14/21 Potential to Achieve Goals: Good  Plan Discharge plan remains appropriate     Co-evaluation                 AM-PAC OT "6 Clicks" Daily Activity     Outcome Measure   Help from another person eating meals?: None Help from another person taking care of personal grooming?: None Help from another person toileting, which includes using toliet, bedpan, or urinal?: None Help from another person bathing (including washing, rinsing, drying)?: A Little Help from another person to put on and taking off regular upper body clothing?: None Help from another person to put on and taking off regular lower body clothing?: A Little 6 Click Score: 22    End of Session Equipment Utilized During Treatment: Gait belt;Rolling walker (2 wheels)  OT Visit Diagnosis: Unsteadiness on feet (R26.81);Muscle weakness (generalized) (M62.81);Pain   Activity Tolerance Patient tolerated treatment well   Patient Left in chair;with call bell/phone within reach   Nurse Communication Mobility status        Time: 6503-5465 OT Time Calculation (min): 30 min  Charges: OT General Charges $OT Visit: 1 Visit OT Treatments $Self Care/Home Management : 23-37 mins  [place]. [place], [place] [facility] [phone] [place] 08/04/2021, 1:55 PM

## 2021-08-04 NOTE — Progress Notes (Signed)
Brief Cardiology Progress Note   - Colonoscopy completed 08/04/21 did not show any active bleeding or blood in the colon. Patient OK to restart plavix   Patient should be discharge on plavix 75mg  daily, aspirin 81mg  daily, losartan 25mg  daily, and metoprolol succinate 25mg  daily.   Margie Billet, PA-C 08/04/2021 5:19 PM

## 2021-08-05 DIAGNOSIS — I493 Ventricular premature depolarization: Secondary | ICD-10-CM | POA: Diagnosis not present

## 2021-08-05 DIAGNOSIS — K5791 Diverticulosis of intestine, part unspecified, without perforation or abscess with bleeding: Secondary | ICD-10-CM | POA: Diagnosis not present

## 2021-08-05 DIAGNOSIS — I2510 Atherosclerotic heart disease of native coronary artery without angina pectoris: Secondary | ICD-10-CM

## 2021-08-05 LAB — CBC
HCT: 30.9 % — ABNORMAL LOW (ref 39.0–52.0)
Hemoglobin: 10.8 g/dL — ABNORMAL LOW (ref 13.0–17.0)
MCH: 32.1 pg (ref 26.0–34.0)
MCHC: 35 g/dL (ref 30.0–36.0)
MCV: 92 fL (ref 80.0–100.0)
Platelets: 235 10*3/uL (ref 150–400)
RBC: 3.36 MIL/uL — ABNORMAL LOW (ref 4.22–5.81)
RDW: 13.5 % (ref 11.5–15.5)
WBC: 6.1 10*3/uL (ref 4.0–10.5)
nRBC: 0 % (ref 0.0–0.2)

## 2021-08-05 LAB — BASIC METABOLIC PANEL
Anion gap: 3 — ABNORMAL LOW (ref 5–15)
BUN: 9 mg/dL (ref 8–23)
CO2: 20 mmol/L — ABNORMAL LOW (ref 22–32)
Calcium: 8.4 mg/dL — ABNORMAL LOW (ref 8.9–10.3)
Chloride: 111 mmol/L (ref 98–111)
Creatinine, Ser: 0.91 mg/dL (ref 0.61–1.24)
GFR, Estimated: >60 mL/min (ref >60)
Glucose, Bld: 145 mg/dL — ABNORMAL HIGH (ref 70–99)
Potassium: 3.7 mmol/L (ref 3.5–5.1)
Sodium: 134 mmol/L — ABNORMAL LOW (ref 135–145)

## 2021-08-05 LAB — GLUCOSE, CAPILLARY
Glucose-Capillary: 152 mg/dL — ABNORMAL HIGH (ref 70–99)
Glucose-Capillary: 176 mg/dL — ABNORMAL HIGH (ref 70–99)

## 2021-08-05 MED ORDER — LOSARTAN POTASSIUM 25 MG PO TABS: 25.0000 mg | ORAL_TABLET | Freq: Every day | ORAL | 0 refills | Status: DC

## 2021-08-05 MED ORDER — SPIRONOLACTONE 25 MG PO TABS: 12.5000 mg | ORAL_TABLET | Freq: Every day | ORAL | 0 refills | Status: DC

## 2021-08-05 MED ORDER — PANTOPRAZOLE SODIUM 40 MG PO TBEC: 40.0000 mg | DELAYED_RELEASE_TABLET | Freq: Every day | ORAL | 0 refills | Status: AC

## 2021-08-05 NOTE — Plan of Care (Signed)
°  Problem: Health Behavior/Discharge Planning: Goal: Ability to manage health-related needs will improve Outcome: Progressing   Problem: Clinical Measurements: Goal: Will remain free from infection Outcome: Progressing Goal: Diagnostic test results will improve Outcome: Progressing Goal: Respiratory complications will improve Outcome: Progressing Goal: Cardiovascular complication will be avoided Outcome: Progressing   

## 2021-08-05 NOTE — Discharge Summary (Addendum)
Name: John Gamble MRN: [id number] DOB: 1931-05-11 88 y.o. PCP: Algis Greenhouse, MD  Date of Admission: 07/30/2021 10:44 AM Date of Discharge: 08/05/2021 Attending Physician: Velna Ochs, MD  Discharge Diagnosis: 1. Likely self-limited diverticular bleed 2. PVCs, PACs 3. HTN 4. HLD 5. Urinary retention 6. CAD 7. Systolic HF 8. T2DM  Discharge Medications: Allergies as of 08/05/2021       Reactions   Atorvastatin Other (See Comments)   myalgias   Oysters [shellfish Allergy] Nausea Only   Sulfa Antibiotics Nausea And Vomiting        Medication List     TAKE these medications    acyclovir 800 MG tablet Commonly known as: ZOVIRAX Take 800 mg by mouth daily.   aspirin EC 81 MG tablet Take 81 mg by mouth daily.   clopidogrel 75 MG tablet Commonly known as: Plavix Take 1 tablet (75 mg total) by mouth daily.   dorzolamide 2 % ophthalmic solution Commonly known as: TRUSOPT Place 1 drop into the right eye at bedtime.   finasteride 5 MG tablet Commonly known as: PROSCAR Take 1 tablet (5 mg total) by mouth daily.   Fish Oil 1200 MG Caps Take 1,200 mg by mouth daily.   fluconazole 100 MG tablet Commonly known as: DIFLUCAN Take 100 mg by mouth daily.   Glucosamine-Chondroitin 250-200 MG Tabs Take 2 tablets by mouth 2 (two) times daily.   losartan 25 MG tablet Commonly known as: COZAAR Take 1 tablet (25 mg total) by mouth daily. Start taking on: August 06, 2021 What changed:  medication strength how much to take   metoprolol succinate 25 MG 24 hr tablet Commonly known as: TOPROL-XL Take 1 tablet (25 mg total) by mouth daily.   multivitamin with minerals Tabs tablet Take 1 tablet by mouth daily. Centrum Silver   OneTouch Delica Plus [id number] Misc SMARTSIG:1 Unit(s) Topical Daily   OneTouch Ultra test strip Generic drug: glucose blood USE 1 STRIP DAILY.   OneTouch Ultra test strip Generic drug: glucose blood daily.   pantoprazole 40 MG  tablet Commonly known as: PROTONIX Take 1 tablet (40 mg total) by mouth daily at 6 (six) AM. Start taking on: August 06, 2021   polyethylene glycol 17 g packet Commonly known as: MIRALAX / GLYCOLAX Take 17 g by mouth daily. Mix with 8 oz liquid and drink   prednisoLONE acetate 1 % ophthalmic suspension Commonly known as: PRED FORTE Place 1 drop into the left eye daily.   rosuvastatin 20 MG tablet Commonly known as: CRESTOR Take 20 mg by mouth daily.   silodosin 8 MG Caps capsule Commonly known as: RAPAFLO Take 8 mg by mouth daily.   spironolactone 25 MG tablet Commonly known as: ALDACTONE Take 0.5 tablets (12.5 mg total) by mouth daily.   Zinc 50 MG Caps Take 50 mg by mouth daily with supper.       Disposition and follow-up:   John Gamble was discharged from [facility] in Good condition.  At the hospital follow up visit please address:  Lower GI bleed, likely self-limited diverticular bleed: GI recommendations: At discharge, patient was restarted on Plavix and his ASA was continued.  He was discharged on pantoprazole 40 mg daily given findings of erosive gastritis on upper endoscopy as well as his age and chronic dual antiplatelet therapies.  He can resume his previous diet. No need for GI follow-up or repeat colonoscopy for surveillance.  Hypertension, Systolic HF At discharge, metoprolol was continued,  and after being held here, his spironolactone was increased to half of his home dose (now he is on 12.5 mg daily rather than 25 mg daily). During his hospitalization, his losartan was decreased from 100 mg daily to 25 mg daily. Losartan 25 mg daily was continued after discharge. Plan to increase under the direction of his PCP.  History of coronary artery disease: Continue ASA and plavix  2.  Labs / imaging needed at time of follow-up: CBC, glucose  3.  Pending labs/ test needing follow-up: Pathology results from upper endoscopy  Follow-up  Appointments:  Hospital Course by problem list: #GI bleed Patient presented with chief complaint of 2 days of bright red blood per rectum that was painless and not associated with abdominal pain.  Admission labs were significant for positive FOBT however otherwise within normal limits and he was hemodynamically stable.  He did experience recurrent episodes of bloody bowel movements overnight, however was largely hemodynamically stable without decreases in hemoglobin.  He did have hypotensive episodes resulting in discontinuing metoprolol 12.5 mg twice daily. Metoprolol was resumed on 1/2 (see below).  Otherwise, the patient was hemodynamically stable throughout his hospital course with only slow decrease in CBC to 10.8 by discharge after his procedure and with no other bloody stools.  On 1/3, patient received bowel prep prior to his upper endoscopy and colonoscopy, which was performed with success on 1/4.  Results of the colonoscopy showed mild reflux esophagitis and mild erosive gastroduodenitis.  Biopsies were taken of tiny superficial erosions.  Colonoscopy showed diverticulosis but no active bleeding.  Thought to be self-limited diverticular bleed.    GI recommendations are as follows: At discharge, patient is okay to resume prior medications including Plavix.  He was discharged on pantoprazole 40 mg daily given findings on upper endoscopy as well as his age and chronic dual antiplatelet therapies.  He resumed his previous diet.  Discharged in stable condition without the need for GI follow-up or repeat colonoscopy for surveillance.  #PVCs, PACs On 1/2, patient was to receive bowel prep prior to his colonoscopy the following day, however there was a concern the patient was having a few runs of V. tach on telemetry.  We consulted the cardiology team who noted isolated PVCs, bigeminy, couplets. PVCs were also noted on his prior EKGs. They cleared patient from a cardiology standpoint to proceed with  colonoscopy, and they restarted his metoprolol 25 mg daily on 1/2.  No runs of V. tach were noted on telemetry during the rest of his hospitalization.  At discharge, cardiology recommended patient restart Plavix 75 mg daily, aspirin 81mg  daily, losartan 25mg  daily, and metoprolol succinate 25mg  daily.   #Hypertension Losartan was decreased from 100 mg daily to 25 mg daily in the setting of hypotension. This dose was continued at discharge. Up-titrate under the discretion of his PCP.   #Hyperlipidemia Patient was continued on home rosuvastatin 20 mg daily.   #History of urinary retention Patient was continued on home Flomax 0.4 mg daily, finasteride 5 mg daily.   #Systolic heart failure On admission, patient was euvolemic with no signs of heart failure exacerbation. He was continued on home doses of metoprolol 25 mg daily and spironolactone 25 mg daily. Later, metoprolol and spironolactone were held in the setting of hypotensive episodes. Metoprolol was restarted, but we resumed spironolactone at a lower dose (12.5 mg daily). Can increase this under the discretion of PCP.  #History of coronary artery disease Patient is on Plavix and aspirin for recent  stent placement (April 2022). Plavix was held during admission secondary to acute GI bleed and in anticipation of colonoscopy. It was resumed on day of discharge.   #Type 2 diabetes mellitus, insulin independent HbA1c noted to be 6.9%. He was managed with sliding scale insulin 3 times daily with meals.  Discharge Exam:   BP 130/66 (BP Location: Left Arm)    Pulse 77    Temp 97.6 F (36.4 C) (Oral)    Resp 17    Ht 6\' 1"  (1.854 m)    Wt 82.7 kg    SpO2 97%    BMI 24.06 kg/m  Discharge exam: Constitutional: Chronically ill elderly gentleman in no acute distress. Cardio: Regular rate and rhythm. No murmurs, rubs, gallops. Pulm: Clear to auscultation bilaterally.  Normal work of breathing on room air. Abdomen: Soft, nontender, nondistended. MSK:  Negative for extremity edema. Distal pulses are normal. Skin: Skin is warm and dry. Neuro: Alert and oriented x3.  No focal deficit noted. Psych: Normal mood and affect.  Pertinent Labs, Studies, and Procedures:   CBC Latest Ref Rng & Units 08/05/2021 08/04/2021 08/03/2021  WBC 4.0 - 10.5 K/uL 6.1 7.5 6.7  Hemoglobin 13.0 - 17.0 g/dL 10.8(L) 11.7(L) 11.3(L)  Hematocrit 39.0 - 52.0 % 30.9(L) 33.3(L) 33.5(L)  Platelets 150 - 400 K/uL 235 246 241    BMP Latest Ref Rng & Units 08/05/2021 08/04/2021 08/02/2021  Glucose 70 - 99 mg/dL 145(H) 180(H) 168(H)  BUN 8 - 23 mg/dL 9 10 13   Creatinine 0.61 - 1.24 mg/dL 0.91 0.94 1.00  BUN/Creat Ratio 10 - 24 - - -  Sodium 135 - 145 mmol/L 134(L) 137 136  Potassium 3.5 - 5.1 mmol/L 3.7 4.0 4.3  Chloride 98 - 111 mmol/L 111 110 104  CO2 22 - 32 mmol/L 20(L) 19(L) 25  Calcium 8.9 - 10.3 mg/dL 8.4(L) 8.7(L) 9.0   CBG (last 3)  Recent Labs    08/04/21 1614 08/04/21 2108 08/05/21 0606  GLUCAP 142* 157* 152*   A1c of 6.9 FOBT positive Resp panel negative  Discharge Instructions: Discharge Instructions     Call MD for:  difficulty breathing, headache or visual disturbances   Complete by: As directed    Call MD for:  extreme fatigue   Complete by: As directed    Call MD for:  persistant dizziness or light-headedness   Complete by: As directed    Call MD for:  persistant nausea and vomiting   Complete by: As directed    Call MD for:  temperature >100.4   Complete by: As directed        Signed: Orvis Brill, MD 08/05/2021, 11:48 AM   Pager: [phone]

## 2021-08-05 NOTE — Anesthesia Postprocedure Evaluation (Signed)
Anesthesia Post Note  Patient: John Gamble  Procedure(s) Performed: COLONOSCOPY WITH PROPOFOL ESOPHAGOGASTRODUODENOSCOPY (EGD) BIOPSY     Patient location during evaluation: Endoscopy Anesthesia Type: MAC Level of consciousness: awake and alert Pain management: pain level controlled Vital Signs Assessment: post-procedure vital signs reviewed and stable Respiratory status: spontaneous breathing, nonlabored ventilation, respiratory function stable and patient connected to nasal cannula oxygen Cardiovascular status: stable and blood pressure returned to baseline Postop Assessment: no apparent nausea or vomiting Anesthetic complications: no   No notable events documented.  Last Vitals:  Vitals:   08/05/21 0345 08/05/21 0500  BP: 132/61   Pulse: 64 72  Resp: 17 17  Temp: 36.5 C   SpO2: 97% 97%    Last Pain:  Vitals:   08/05/21 0345  TempSrc: Oral  PainSc:                  [name]

## 2021-08-05 NOTE — Progress Notes (Signed)
Patient's monitor showing afib and SB. EKG done showing sinus rhythm with PACs. Patient asymptomatic.  Will continue to monitor

## 2021-08-05 NOTE — Discharge Instructions (Addendum)
You were seen in the hospital for a gastrointestinal bleed.  You had a procedure done which showed some irritation of the stomach lining and no active bleeding in your colon.  At this time, you can start taking your Plavix and aspirin medicines together.  We have restarted your home metoprolol.  Otherwise, for your losartan, I would like you to take this lower dose of 25 mg daily.  Please increase this under the instruction of your primary care provider.  I have also discharged you on half of your normal home dose of spironolactone (12.5 mg daily now).  Please also increase this under the direction of your primary care provider.  We also recommend that you take a medication called pantoprazole 40 mg daily.  This will help the stomach lining be less irritated and decrease your risk of a bleed from the stomach.  Otherwise, please limit your intake of alcohol and coffee to help with the stomach lining.  Otherwise, follow-up with your primary care doctor in the next days to week to discuss your recent hospitalization.  If any symptoms change or worsen acutely, please return to the nearest emergency department.

## 2021-08-05 NOTE — Progress Notes (Deleted)
HD#5 SUBJECTIVE:  The patient was seen at bedside during rounds this morning.  He states that the procedure went well yesterday. He had difficulty sleeping due to being in the hospital. No further signs of blood in stool.   OBJECTIVE:  Vital Signs: Vitals:   08/04/21 1946 08/04/21 2354 08/05/21 0345 08/05/21 0500  BP: 111/89 (!) 111/98 132/61   Pulse: 61 73 64 72  Resp: 19 20 17 17   Temp: (!) 97.4 F (36.3 C) 98 F (36.7 C) 97.7 F (36.5 C)   TempSrc: Oral Oral Oral   SpO2: 98% 100% 97% 97%  Weight:    82.7 kg  Height:       Supplemental O2: Room Air SpO2: 97 % O2 Flow Rate (L/min): 3 L/min  Filed Weights   08/03/21 0300 08/04/21 0500 08/05/21 0500  Weight: 85 kg 83 kg 82.7 kg    Intake/Output Summary (Last 24 hours) at 08/05/2021 0701 Last data filed at 08/05/2021 0007 Gross per 24 hour  Intake 650 ml  Output --  Net 650 ml    Net IO Since Admission: -1,580.09 mL [08/05/21 0701]  Physical Exam: Constitutional: Chronically ill elderly gentleman in no acute distress. Cardio: Regular rate and rhythm. No murmurs, rubs, gallops. Pulm: Clear to auscultation bilaterally.  Normal work of breathing on room air. Abdomen: Soft, nontender, nondistended. MSK: Negative for extremity edema. Distal pulses are normal. Skin: Skin is warm and dry. Neuro: Alert and oriented x3.  No focal deficit noted. Psych: Normal mood and affect.  Patient Lines/Drains/Airways Status     Active Line/Drains/Airways     Name Placement date Placement time Site Days   Peripheral IV 07/30/21 20 G Anterior;Left Forearm 07/30/21  1243  Forearm  2   External Urinary Catheter 07/31/21  1901  --  1   Incision (Closed) 09/19/14 Knee Left 09/19/14  1512  -- 2508             ASSESSMENT/PLAN:  Assessment: Principal Problem:   GI bleed Active Problems:   Coronary artery disease   Hypertension   Hyperlipidemia   Type 2 diabetes mellitus without complication, without long-term current use of  insulin (HCC)   Acute on chronic combined systolic and diastolic CHF (congestive heart failure) (HCC)   PVC's (premature ventricular contractions)   Platelet inhibition due to Plavix   Hematochezia   Acute blood loss anemia   Diverticulosis of colon with hemorrhage   Gastritis and gastroduodenitis  OLIVERIO CHO is a 88 y.o. male with a pertinent PMH of hypertension, type II diabetes mellitus, hyperlipidemia, coronary artery disease s/p left circumflex PCI in 2000 and PCI w/ atherectomy of ostial LAD with DES in 08/6107, chronic systolic heart failure 2/2 ischemic cardiomyopathy (EF 45-50%), osteoarthritis s/p bilateral knee replacement, hx of suspected diverticular bleed in 2013, hemorrhoids s/p banding, multiple hernia repair surgeries who presents to [facility] with rectal bleeding. and admitted for a lower GI bleed.   CBC hgb 10.8 from 11.7   Some superficial erosions that were biopsied and believe it is a self-limited diverticulitis Patient should be discharged on plavix 75mg  daily, aspirin 81mg  daily, losartan 25mg  daily, and metoprolol succinate 25mg  daily.   Plan: #Lower GI bleed Patient has been hemodynamically stable with occasional hypotensive episodes, and hemoglobin is stable as well, most recently at 11.3.  He continues to endorse intermittent dizziness with ambulation, however describes this as unchanged from his baseline. -GI consulted, appreciate their assistance -Plan for colonoscopy today, 1/4 at 2:30 PM -  Transfuse if Hgb less than 7 -Trend CBC  #PVCs On 1/2, patient was to receive bowel prep prior to colonoscopy, however there was a concern the patient was having a few runs of V. tach on telemetry.  We consulted the cardiology team who noted isolated PVCs, bigeminy, couplets. PVCs were also noted on his prior EKGs. They cleared patient from a cardiology standpoint to proceed with colonoscopy, and they restarted his metoprolol 25 mg daily.  No runs of V. tach noticed on telemetry  today. We will continue to monitor at this time. -Cardiology on board, appreciate their assistance -Continue metoprolol 25 mg daily   #History of hypertension Blood pressure has been quite labile with some softer pressures recently. Pt is on losartan 100 mg daily at home. Currently holding home spiro as below.  -Continue home losartan 100 mg daily   #Hyperlipidemia Most recent lipid panel on 12/27 remarkable only for elevated triglycerides at 257.  Patient is on rosuvastatin 20 mg daily at home. -Continue rosuvastatin 20 mg daily   #History of urinary retention Currently managed with Flomax 0.4 mg daily, finasteride 5 mg daily. -Continue home Flomax 0.5 mg daily and finasteride 5 mg daily   #Systolic heart failure Managed outpatient with metoprolol 25 mg daily, spironolactone 25 mg daily. On exam patient is clinically euvolemic.  -Continue metoprolol 25 mg daily  -Hold spironolactone 12.5 mg twice daily secondary to hypotension   #History of coronary artery disease Patient has had multiple stents placed, most recently in April 2022.  -Holding Plavix, continuing aspirin   #Type 2 diabetes mellitus, insulin independent Most recent HbA1c on 12/27 of 7.4%. The patient states that he does not medically manage his diabetes, just that he and his primary care physician monitor it. -SSI 3 times daily with meals  Best Practice: Diet: Clear liquid diet IVF: Fluids: IV NS @ 50 mL/hr VTE: SCDs Start: 07/30/21 1411 Code: Full AB: None Therapy Recs: Home Health, DME: none DISPO: Anticipated discharge in 1-2 days to Home pending Medical stability and Pending colonoscopy  Signature: Mitzie Na, M.D. Internal Medicine Resident, PGY-1 [facility]  Pager: [phone] 7:01 AM, 08/05/2021   Please contact the on call pager after 5 pm and on weekends at [phone].

## 2021-08-06 ENCOUNTER — Encounter (HOSPITAL_COMMUNITY): Payer: Self-pay | Admitting: Internal Medicine

## 2021-08-06 LAB — SURGICAL PATHOLOGY

## 2021-08-25 ENCOUNTER — Other Ambulatory Visit: Payer: Self-pay | Admitting: Internal Medicine

## 2021-08-27 ENCOUNTER — Other Ambulatory Visit: Payer: Self-pay | Admitting: Internal Medicine

## 2021-09-01 ENCOUNTER — Other Ambulatory Visit: Payer: Self-pay | Admitting: Internal Medicine

## 2021-09-03 ENCOUNTER — Telehealth: Payer: Self-pay | Admitting: Cardiology

## 2021-09-03 MED ORDER — LOSARTAN POTASSIUM 25 MG PO TABS: 25.0000 mg | ORAL_TABLET | Freq: Every day | ORAL | 1 refills | Status: DC

## 2021-09-03 NOTE — Telephone Encounter (Signed)
°*  STAT* If patient is at the pharmacy, call can be transferred to refill team.   1. Which medications need to be refilled? (please list name of each medication and dose if known) losartan (COZAAR) 25 MG tablet  2. Which pharmacy/location (including street and city if local pharmacy) is medication to be sent to? [id number] [place]  [facility] [place]  3. Do they need a 30 day or 90 day supply? 30 day   Patient is completely out of medication.

## 2021-09-03 NOTE — Telephone Encounter (Signed)
Refill sent to pharmacy.   

## 2021-09-20 NOTE — Progress Notes (Signed)
Cardiology Office Note    Date:  09/27/2021   ID:  [name], DOB 1930-11-29, MRN [id number]  PCP:  Algis Greenhouse, MD  Cardiologist:  Kinzley Savell [name], MD   Chief Complaint  Patient presents with   Follow-up    6 months.   Coronary Artery Disease       History of Present Illness: John Gamble is a 88 y.o. male who presents for follow up CAD. He has a history of coronary disease and is status post stenting of the left circumflex coronary in 2000. He had a normal stress Myoview in July of 2011. He also has a history of hypertension and hyperlipidemia.   In September 2016 he had a nuclear stress test that was abnormal. This led to a cardiac cath.. Stents were patent but he did have ostial RCA (nondominant) and diagonal disease. Since he was asymptomatic medical therapy was recommended.   He did have Covid 19 infection in January 2021. He was admitted to [facility] with hypoxia and bilateral PNA.  He brings a diary of his BP readings and his BP has been excellent. He notes that since his Covid infection his energy is not as good and he has more dyspnea on exertion. Before he was able to ride his exercise bike for 30 minutes straight. Now he has to stop a couple of times. No chest pain. Sometimes has a little left arm pain.   He was evaluated with a Myoview study which was high risk with EF 25% and multiple perfusion abnormalities. This led to a cardiac cath which showed patent stents in the dominant LCx. There was critical ostial LAD disease with heavy calcification. Fortunately right heart and LV filling pressures were ok. Patient returned for complex PCI with atherectomy on 11/04/20. In the interim he did develop hematuria on DAPT. Was seen in the ED and started on Keflex for possible UTI. Patient reported improvement so we did proceed with PCI involving atherectomy and stenting of the ostial LAD with DES. An excellent result was obtained. Unfortunately he developed recurrent urinary complaints  and had some obstruction. A Foley was placed with come clots noted. He was irrigated with clearing urine. Placed on antibiotics with plans to leave foley in place with outpatient Urology follow up. It was felt he had BPH. Wilder Glade was held due to UTI. A1c was 6.6%. He was continued on Entresto and Toprol XL and aldactone were added. With revascularization and medical therapy there was significant improvement in LV function with EF up to 45-50%.   He was admitted in January with lower GI bleed. Colonoscopy showed diverticuli. Upper EGD showed mild erosive gastritis. Hgb dropped to 10.3. managed medically. Plavix initially held but resumed at DC. BP meds reduced due to hypotension (losartan and aldactone). Some NSVT noted on monitor but asymptomatic. Since then he has felt fine. No chest pain or dyspnea. No swelling. No recurrent bleeding. On home BP monitor BP is running high.    Past Medical History:  Diagnosis Date   Coronary artery disease    Hyperlipidemia    Hypertension    MI, old    OA (osteoarthritis)    Pneumonia    hx of 2013     Past Surgical History:  Procedure Laterality Date   BIOPSY  08/04/2021   Procedure: BIOPSY;  Surgeon: Irene Shipper, MD;  Location: [facility] ENDOSCOPY;  Service: Endoscopy;;   CARDIAC CATHETERIZATION  01/26/1999   EF 55%   CARDIAC CATHETERIZATION N/A  05/06/2015   Procedure: Left Heart Cath and Coronary Angiography;  Surgeon: Terren Jandreau M [name], MD;  Location: [place] CV LAB;  Service: Cardiovascular;  Laterality: N/A;   CARDIOVASCULAR STRESS TEST  02/22/2010   EF 61%   COLONOSCOPY WITH PROPOFOL N/A 08/04/2021   Procedure: COLONOSCOPY WITH PROPOFOL;  Surgeon: Irene Shipper, MD;  Location: [facility] ENDOSCOPY;  Service: Endoscopy;  Laterality: N/A;   corneal implant     left   CORONARY ATHERECTOMY N/A 11/04/2020   Procedure: CORONARY ATHERECTOMY;  Surgeon: [name], Kazi Montoro M, MD;  Location: [place] CV LAB;  Service: Cardiovascular;  Laterality: N/A;   CORONARY STENT  INTERVENTION N/A 11/04/2020   Procedure: CORONARY STENT INTERVENTION;  Surgeon: [name], Caleah Tortorelli M, MD;  Location: [place] CV LAB;  Service: Cardiovascular;  Laterality: N/A;   CORONARY STENT PLACEMENT  2000   BMS LEFT CIRCUMFLEX CORONARY   ESOPHAGOGASTRODUODENOSCOPY N/A 08/04/2021   Procedure: ESOPHAGOGASTRODUODENOSCOPY (EGD);  Surgeon: Irene Shipper, MD;  Location: [facility] ENDOSCOPY;  Service: Endoscopy;  Laterality: N/A;   INGUINAL HERNIA REPAIR     INTRAVASCULAR ULTRASOUND/IVUS N/A 11/04/2020   Procedure: Intravascular Ultrasound/IVUS;  Surgeon: [name], Monesha Monreal M, MD;  Location: [place] CV LAB;  Service: Cardiovascular;  Laterality: N/A;   REPLACEMENT TOTAL KNEE     RIGHT/LEFT HEART CATH AND CORONARY ANGIOGRAPHY N/A 10/27/2020   Procedure: RIGHT/LEFT HEART CATH AND CORONARY ANGIOGRAPHY;  Surgeon: [name], Shardae Kleinman M, MD;  Location: [place] CV LAB;  Service: Cardiovascular;  Laterality: N/A;   TOTAL KNEE ARTHROPLASTY Left 09/19/2014   Procedure: LEFT TOTAL KNEE ARTHROPLASTY;  Surgeon: Sydnee Cabal, MD;  Location: [facility] ORS;  Service: Orthopedics;  Laterality: Left;     Current Outpatient Medications  Medication Sig Dispense Refill   acyclovir (ZOVIRAX) 800 MG tablet Take 800 mg by mouth daily.      aspirin EC 81 MG tablet Take 81 mg by mouth daily.     clopidogrel (PLAVIX) 75 MG tablet Take 1 tablet (75 mg total) by mouth daily. 90 tablet 3   dorzolamide (TRUSOPT) 2 % ophthalmic solution Place 1 drop into the right eye at bedtime.     finasteride (PROSCAR) 5 MG tablet Take 1 tablet (5 mg total) by mouth daily.     Glucosamine-Chondroitin 250-200 MG TABS Take 2 tablets by mouth 2 (two) times daily.     glucose blood (ONETOUCH ULTRA) test strip USE 1 STRIP DAILY.     Lancets (ONETOUCH DELICA PLUS [id number]) MISC SMARTSIG:1 Unit(s) Topical Daily     metoprolol succinate (TOPROL-XL) 25 MG 24 hr tablet Take 1 tablet (25 mg total) by mouth daily. 90 tablet 3   Multiple Vitamin (MULTIVITAMIN WITH  MINERALS) TABS tablet Take 1 tablet by mouth daily. Centrum Silver     Omega-3 Fatty Acids (FISH OIL) 1200 MG CAPS Take 1,200 mg by mouth daily.     ONETOUCH ULTRA test strip daily.     pantoprazole (PROTONIX) 40 MG tablet Take 1 tablet (40 mg total) by mouth daily at 6 (six) AM. 30 tablet 0   polyethylene glycol (MIRALAX / GLYCOLAX) packet Take 17 g by mouth daily. Mix with 8 oz liquid and drink     prednisoLONE acetate (PRED FORTE) 1 % ophthalmic suspension Place 1 drop into the left eye daily.     rosuvastatin (CRESTOR) 20 MG tablet Take 20 mg by mouth daily.     silodosin (RAPAFLO) 8 MG CAPS capsule Take 8 mg by mouth daily.     spironolactone (ALDACTONE)  25 MG tablet Take 0.5 tablets (12.5 mg total) by mouth daily. 15 tablet 0   Zinc 50 MG CAPS Take 50 mg by mouth daily with supper.      No current facility-administered medications for this visit.    Allergies:   Atorvastatin, Oysters [shellfish allergy], and Sulfa antibiotics    Social History:  The patient  reports that he quit smoking about 32 years ago. His smoking use included cigarettes. He has a 25.00 pack-year smoking history. He has never used smokeless tobacco. He reports that he does not drink alcohol and does not use drugs.   Family History:  The patient's family history includes Cancer in his father and mother.    ROS:  Please see the history of present illness.   Otherwise, review of systems are positive for none.   All other systems are reviewed and negative.    PHYSICAL EXAM: VS:  BP 136/74 (BP Location: Left Arm, Patient Position: Sitting, Cuff Size: Normal)    Pulse 72    Ht 6\' 1"  (1.854 m)    Wt 186 lb (84.4 kg)    BMI 24.54 kg/m  , BMI Body mass index is 24.54 kg/m. GENERAL:  Well appearing WM in NAD HEENT:  PERRL, EOMI, sclera are clear. Oropharynx is clear. NECK:  No jugular venous distention, carotid upstroke brisk and symmetric, no bruits, no thyromegaly or adenopathy LUNGS:  Clear to auscultation  bilaterally CHEST:  Unremarkable HEART:  RRR,  PMI not displaced or sustained,S1 and S2 within normal limits, no S3, no S4: no clicks, no rubs, no murmurs ABD:  Soft, nontender. BS +, no masses or bruits. No hepatomegaly, no splenomegaly EXT:  2 + pulses throughout, no edema, no cyanosis no clubbing. Radial site without hematoma.  SKIN:  Warm and dry.  No rashes NEURO:  Alert and oriented x 3. Cranial nerves II through XII intact. PSYCH:  Cognitively intact  EKG:  EKG is not  ordered today.     Recent Labs: 07/30/2021: ALT 25 08/02/2021: Magnesium 2.1 08/05/2021: BUN 9; Creatinine, Ser 0.91; Hemoglobin 10.8; Platelets 235; Potassium 3.7; Sodium 134    Lipid Panel    Component Value Date/Time   CHOL 91 11/05/2020 0243   TRIG 80 11/05/2020 0243   HDL 35 (L) 11/05/2020 0243   CHOLHDL 2.6 11/05/2020 0243   VLDL 16 11/05/2020 0243   LDLCALC 40 11/05/2020 0243      Wt Readings from Last 3 Encounters:  09/27/21 186 lb (84.4 kg)  08/05/21 182 lb 6.4 oz (82.7 kg)  03/25/21 189 lb 3.2 oz (85.8 kg)      Other studies Reviewed: Additional studies/ records that were reviewed today include:  November 2017. Cholesterol 164, triglycerides 300, HDL 32 LDL 83. A1c 6.7%. Dated 12/28/16: glucose 150, A1c 7%. Cholesterol 94, triglyderdes 198, HDL 30, LDL 38. Other chemistries normal.  Dated 07/06/17. Glucose 155, otherwise CMET normal. Cholesterol 109, triglycerides 164, HDL 35, LDL 59.  A1c 6.5%. Dated 07/17/18: cholesterol 108, triglycerides 167, HDL 38, LDL 55. Glucose 142. Otherwise CMET normal. A1c 6.7% Dated 07/22/19: cholesterol 103, triglycerides 187, HDL 36, LDL 53.  Dated 08/17/19: glucose 147. Otherwise CMET normal. Hgb 13.9.  Dated 01/23/20: Glucose 127. Otherwise CMET normal. Dated 07/27/20: glucose 165, otherwise CMET normal. Cholesterol 108, triglycerides 155, HDL 35, LDL 54. A1c 6.6%.    Myoview 10/21/20: Study Highlights  Nuclear stress EF: 22%. The left ventricular ejection  fraction is severely decreased (<30%). Defect 1: There is a defect  present in the mid anterior, mid anteroseptal, mid inferolateral, mid anterolateral, apical anterior, apical inferior, apical lateral and apex location. Findings consistent with prior large anterior apical myocardial infarction. This is a high risk study. There is no evidence of ischemia . He has had a previous anterior apical MI   Cardiac cath 10/27/20:  RIGHT/LEFT HEART CATH AND CORONARY ANGIOGRAPHY    Conclusion    Ost LAD to Prox LAD lesion is 95% stenosed. 1st Diag lesion is 70% stenosed. Previously placed Ost Cx to Prox Cx stent (unknown type) is widely patent. Previously placed LPDA stent (unknown type) is widely patent. Ost RCA to Prox RCA lesion is 90% stenosed. There is severe left ventricular systolic dysfunction. LV end diastolic pressure is normal. The left ventricular ejection fraction is 25-35% by visual estimate.   1. Severe 2 vessel obstructive CAD.    - 90% ostial LAD. Severely calcified. 70% first diagonal    - patent stents in the proximal LCx and left PDA    - 90% ostial nondominant RCA 2. Severe LV dysfunction with regional wall motion abnormalities. 3. Normal LV filling pressures 4. Normal right heart pressures. 5. Preserved cardiac output.    Plan: recommend staged complex  PCI of the ostial LAD with atherectomy and stenting. The diagonal and RCA disease are old and involve small vessels. While CABG would be an option given his advanced age I would favor a percutaneous approach.    PCI 11/04/20: Procedures  CORONARY STENT INTERVENTION  CORONARY ATHERECTOMY  Intravascular Ultrasound/IVUS    Conclusion    Ost LAD to Prox LAD lesion is 95% stenosed. A drug-eluting stent was successfully placed using a SYNERGY XD [id number]. Post intervention, there is a 0% residual stenosis.   1. Successful PCI of the ostial LAD with IVUS guidance, orbital atherectomy and DES x 1   Plan: DAPT for at least  6 months. Will observe overnight and anticipate DC in am.     Recommendations  Antiplatelet/Anticoag Recommend uninterrupted dual antiplatelet therapy with Aspirin 81mg  daily and Clopidogrel 75mg  daily for a minimum of 6 months (stable ischemic heart disease-Class I recommendation   Coronary Diagrams   Diagnostic Dominance: Left    Intervention     Implants      Echo 11/05/20: IMPRESSIONS     1. Diffuse hypokinesis worse in inferior base with abnormal septal motion  . Left ventricular ejection fraction, by estimation, is 25 to 30%. The  left ventricle has severely decreased function. The left ventricle  demonstrates global hypokinesis. The left   ventricular internal cavity size was moderately dilated. There is mild  left ventricular hypertrophy. Left ventricular diastolic parameters are  indeterminate.   2. Right ventricular systolic function is normal. The right ventricular  size is normal. There is normal pulmonary artery systolic pressure.   3. The mitral valve is normal in structure. Mild mitral valve  regurgitation. No evidence of mitral stenosis.   4. The aortic valve is tricuspid. Aortic valve regurgitation is trivial.  No aortic stenosis is present.   5. The inferior vena cava is dilated in size with >50% respiratory  variability, suggesting right atrial pressure of 8 mmHg.    Echo 01/19/21: IMPRESSIONS     1. Left ventricular ejection fraction, by estimation, is 45 to 50%. The  left ventricle has mildly decreased function. The left ventricle  demonstrates regional wall motion abnormalities (see scoring  diagram/findings for description). Left ventricular  diastolic parameters are consistent with Grade I diastolic dysfunction  (  impaired relaxation). There is severe akinesis of the left ventricular,  basal inferior wall.   2. Right ventricular systolic function is normal. The right ventricular  size is normal. There is normal pulmonary artery systolic  pressure.   3. Right atrial size was mildly dilated.   4. The mitral valve is normal in structure. Trivial mitral valve  regurgitation.   5. The aortic valve is tricuspid. Aortic valve regurgitation is mild.   6. There is dilatation of the ascending aorta.   ASSESSMENT AND PLAN:  1. Coronary disease status post remote stenting of the left circumflex in 2000 with a bare-metal stent. Cath  in 2016 showed  Patent stents in LCx. New disease in diagonal and ostial nondominant RCA  that correlate with stress test results. This year had High risk Myoview study. EF down to 25-30%. Cardiac cath demonstrated critical ostial LAD stenosis. Now s/p successful PCI with atherectomy and DES in April 2022.  On DAPT for at least a year. If he has recurrent GI bleed will need to stop Plavix sooner. Fortunately LV function improved significantly with revascularization.  2. Acute on chronic systolic CHF secondary to ischemic CM. S/p revascularization as noted. Will continue Toprol XL to 25 mg daily. continue Aldactone to 12.5 mg daily. Increase  previously unable to afford Entresto. Will increase losartan to 50 mg daily since BP elevated. Would avoid SGLT 2 inhibitor given hx of bladder yeast infection. Repeat Echo showed significant improvement in EF to 45-50%.   3. Hypertension- increase losartan.  4. Hyperlipidemia- cholesterol well controlled on Crestor.   5. Diabetes mellitus.  Reviewed recommendations for low carb diet and weight loss. Per Dr. Garlon Hatchet.  6. UTI with obstructive uropathy and hematuria.Follow up with Urology.  7. S/p recent diverticular bleed. Resolved. If any recurrent bleeding I would stop Plavix.     Disposition:   FU with me in 6 months   Signed, Vayla Wilhelmi [name], MD  09/27/2021 10:30 AM    [facility] [place], [place], [place]  [address] Phone: [phone]; Fax: [phone]

## 2021-09-21 ENCOUNTER — Telehealth: Payer: Self-pay | Admitting: Cardiology

## 2021-09-21 MED ORDER — METOPROLOL SUCCINATE ER 25 MG PO TB24: 25.0000 mg | ORAL_TABLET | Freq: Every day | ORAL | 3 refills | Status: DC

## 2021-09-21 NOTE — Telephone Encounter (Signed)
[facility]

## 2021-09-21 NOTE — Telephone Encounter (Signed)
°*  STAT* If patient is at the pharmacy, call can be transferred to refill team.   1. Which medications need to be refilled? (please list name of each medication and dose if known) Metoprolol  2. Which pharmacy/location (including street and city if local pharmacy) is medication to be sent to?[facility] [place] [place]  3. Do they need a 30 day or 90 day supply? 90 days and refills

## 2021-09-27 ENCOUNTER — Ambulatory Visit: Payer: Medicare Other | Admitting: Cardiology

## 2021-09-27 ENCOUNTER — Other Ambulatory Visit: Payer: Self-pay

## 2021-09-27 ENCOUNTER — Encounter: Payer: Self-pay | Admitting: Cardiology

## 2021-09-27 VITALS — BP 136/74 | HR 72 | Ht 73.0 in | Wt 186.0 lb

## 2021-09-27 DIAGNOSIS — I10 Essential (primary) hypertension: Secondary | ICD-10-CM

## 2021-09-27 DIAGNOSIS — I2510 Atherosclerotic heart disease of native coronary artery without angina pectoris: Secondary | ICD-10-CM | POA: Diagnosis not present

## 2021-09-27 DIAGNOSIS — I5022 Chronic systolic (congestive) heart failure: Secondary | ICD-10-CM

## 2021-09-27 DIAGNOSIS — E782 Mixed hyperlipidemia: Secondary | ICD-10-CM | POA: Diagnosis not present

## 2021-09-27 MED ORDER — LOSARTAN POTASSIUM 50 MG PO TABS: 50.0000 mg | ORAL_TABLET | Freq: Every day | ORAL | 3 refills | Status: DC

## 2021-09-27 NOTE — Patient Instructions (Signed)
Increase losartan to 50 mg daily  Continue your other therapy  Follow up in 6 months.

## 2021-10-18 ENCOUNTER — Other Ambulatory Visit: Payer: Self-pay

## 2021-10-18 MED ORDER — CLOPIDOGREL BISULFATE 75 MG PO TABS: 75.0000 mg | ORAL_TABLET | Freq: Every day | ORAL | 3 refills | Status: DC

## 2021-10-28 ENCOUNTER — Other Ambulatory Visit: Payer: Self-pay

## 2021-10-29 ENCOUNTER — Telehealth: Payer: Self-pay | Admitting: Cardiology

## 2021-10-29 NOTE — Telephone Encounter (Signed)
?*  STAT* If patient is at the pharmacy, call can be transferred to refill team. ? ? ?1. Which medications need to be refilled? (please list name of each medication and dose if known)  ? clopidogrel (PLAVIX) 75 MG tablet  ? ?2. Which pharmacy/location (including street and city if local pharmacy) is medication to be sent to? [id number] [place] [address]?3. Do they need a 30 day or 90 day supply? 90 day ?

## 2021-11-01 MED ORDER — CLOPIDOGREL BISULFATE 75 MG PO TABS
75.0000 mg | ORAL_TABLET | Freq: Every day | ORAL | 3 refills | Status: DC
Start: 2021-11-01 — End: 2022-03-02

## 2021-11-01 NOTE — Telephone Encounter (Signed)
Refill sent to pharmacy.   

## 2021-12-02 ENCOUNTER — Telehealth: Payer: Self-pay | Admitting: Cardiology

## 2021-12-02 NOTE — Telephone Encounter (Signed)
Spoke with pt daughter, she is concerned because the patient has started shuffling his feet and not working much. She is wanting to know if there is therapy or sometime they can get him into to help. Advised her to contact the medical doctor as they would know more about what is available for that issue than we would. Pt daughter agreed to copntact the medical doctor. ?

## 2021-12-02 NOTE — Telephone Encounter (Signed)
Patient's daughter states she thinks the is supposed to be exercising but he is not. She says he is moving slower and slower and and is concerned about him. She wants to know if there is something that can help, like therapy.  ?

## 2022-01-07 ENCOUNTER — Other Ambulatory Visit: Payer: Self-pay | Admitting: Internal Medicine

## 2022-01-11 ENCOUNTER — Other Ambulatory Visit: Payer: Self-pay | Admitting: Internal Medicine

## 2022-01-13 ENCOUNTER — Telehealth: Payer: Self-pay | Admitting: Cardiology

## 2022-01-13 MED ORDER — SPIRONOLACTONE 25 MG PO TABS: 12.5000 mg | ORAL_TABLET | Freq: Every day | ORAL | 3 refills | Status: DC

## 2022-01-13 NOTE — Telephone Encounter (Signed)
*  STAT* If patient is at the pharmacy, call can be transferred to refill team.   1. Which medications need to be refilled? (please list name of each medication and dose if known) spironolactone (ALDACTONE) 25 MG tablet  2. Which pharmacy/location (including street and city if local pharmacy) is medication to be sent to? [id number] [place], [place] [address] 3. Do they need a 30 day or 90 day supply? [address]

## 2022-02-25 ENCOUNTER — Other Ambulatory Visit: Payer: Self-pay | Admitting: Cardiology

## 2022-02-25 NOTE — Progress Notes (Unsigned)
Cardiology Office Note    Date:  03/02/2022   ID:  JACE FERMIN, DOB [birth year], MRN [id number]  PCP:  Algis Greenhouse, MD  Cardiologist:  Imajean Mcdermid [name], MD   Chief Complaint  Patient presents with   Coronary Artery Disease       History of Present Illness: John Gamble is a 88 y.o. male who presents for follow up CAD. He has a history of coronary disease and is status post stenting of the left circumflex coronary in 2000. He had a normal stress Myoview in July of 2011. He also has a history of hypertension and hyperlipidemia.   In September 2016 he had a nuclear stress test that was abnormal. This led to a cardiac cath.. Stents were patent but he did have ostial RCA (nondominant) and diagonal disease. Since he was asymptomatic medical therapy was recommended.   He did have Covid 19 infection in January 2021. He was admitted to [facility] with hypoxia and bilateral PNA.  He brings a diary of his BP readings and his BP has been excellent. He notes that since his Covid infection his energy is not as good and he has more dyspnea on exertion. He had persistent DOE and some arm pain.   He was evaluated with a Myoview study which was high risk with EF 25% and multiple perfusion abnormalities. This led to a cardiac cath which showed patent stents in the dominant LCx. There was critical ostial LAD disease with heavy calcification. Fortunately right heart and LV filling pressures were ok. Patient returned for complex PCI with atherectomy on 11/04/20. He underwent  PCI involving atherectomy and stenting of the ostial LAD with DES. An excellent result was obtained.  Wilder Glade was held due to recurrent UTI. A1c was 6.6%. He was continued on ARB ( unable to afford Entresto ). and Toprol XL and aldactone were added. With revascularization and medical therapy there was significant improvement in LV function with EF up to 45-50%.   He was admitted in January 2023 with lower GI bleed. Colonoscopy showed  diverticuli. Upper EGD showed mild erosive gastritis. Hgb dropped to 10.3. managed medically. Plavix initially held but resumed at DC. BP meds reduced due to hypotension (losartan and aldactone). Some NSVT noted on monitor but asymptomatic. On follow up BP was high and losartan and aldactone resumed.  On follow up today he is doing OK. No significant chest pain or dyspnea. No swelling. No bleeding. Brings BP readings from home which are consistently high. Sometimes HR reads in 40s. He is quite sedentary now.      Past Medical History:  Diagnosis Date   Coronary artery disease    Hyperlipidemia    Hypertension    MI, old    OA (osteoarthritis)    Pneumonia    hx of 2013     Past Surgical History:  Procedure Laterality Date   BIOPSY  08/04/2021   Procedure: BIOPSY;  Surgeon: Irene Shipper, MD;  Location: [facility] ENDOSCOPY;  Service: Endoscopy;;   CARDIAC CATHETERIZATION  01/26/1999   EF 55%   CARDIAC CATHETERIZATION N/A 05/06/2015   Procedure: Left Heart Cath and Coronary Angiography;  Surgeon: Lakenzie Mcclafferty M [name], MD;  Location: [place] CV LAB;  Service: Cardiovascular;  Laterality: N/A;   CARDIOVASCULAR STRESS TEST  02/22/2010   EF 61%   COLONOSCOPY WITH PROPOFOL N/A 08/04/2021   Procedure: COLONOSCOPY WITH PROPOFOL;  Surgeon: Irene Shipper, MD;  Location: [facility] ENDOSCOPY;  Service: Endoscopy;  Laterality:  N/A;   corneal implant     left   CORONARY ATHERECTOMY N/A 11/04/2020   Procedure: CORONARY ATHERECTOMY;  Surgeon: [name], Wandell Scullion M, MD;  Location: [place] CV LAB;  Service: Cardiovascular;  Laterality: N/A;   CORONARY STENT INTERVENTION N/A 11/04/2020   Procedure: CORONARY STENT INTERVENTION;  Surgeon: [name], Alesi Zachery M, MD;  Location: [place] CV LAB;  Service: Cardiovascular;  Laterality: N/A;   CORONARY STENT PLACEMENT  2000   BMS LEFT CIRCUMFLEX CORONARY   ESOPHAGOGASTRODUODENOSCOPY N/A 08/04/2021   Procedure: ESOPHAGOGASTRODUODENOSCOPY (EGD);  Surgeon: Irene Shipper, MD;   Location[facility] ENDOSCOPY;  Service: Endoscopy;  Laterality: N/A;   INGUINAL HERNIA REPAIR     INTRAVASCULAR ULTRASOUND/IVUS N/A 11/04/2020   Procedure: Intravascular Ultrasound/IVUS;  Surgeon: [name], Zakariya Knickerbocker M, MD;  Location: [place] CV LAB;  Service: Cardiovascular;  Laterality: N/A;   REPLACEMENT TOTAL KNEE     RIGHT/LEFT HEART CATH AND CORONARY ANGIOGRAPHY N/A 10/27/2020   Procedure: RIGHT/LEFT HEART CATH AND CORONARY ANGIOGRAPHY;  Surgeon: [name], Olan Kurek M, MD;  Location: [place] CV LAB;  Service: Cardiovascular;  Laterality: N/A;   TOTAL KNEE ARTHROPLASTY Left 09/19/2014   Procedure: LEFT TOTAL KNEE ARTHROPLASTY;  Surgeon: Sydnee Cabal, MD;  Location: [facility] ORS;  Service: Orthopedics;  Laterality: Left;     Current Outpatient Medications  Medication Sig Dispense Refill   acyclovir (ZOVIRAX) 800 MG tablet Take 800 mg by mouth daily.      aspirin EC 81 MG tablet Take 81 mg by mouth daily.     dorzolamide (TRUSOPT) 2 % ophthalmic solution Place 1 drop into the right eye at bedtime.     finasteride (PROSCAR) 5 MG tablet Take 1 tablet (5 mg total) by mouth daily.     Glucosamine-Chondroitin 250-200 MG TABS Take 2 tablets by mouth 2 (two) times daily.     glucose blood (ONETOUCH ULTRA) test strip USE 1 STRIP DAILY.     Lancets (ONETOUCH DELICA PLUS [id number]) MISC SMARTSIG:1 Unit(s) Topical Daily     metoprolol succinate (TOPROL-XL) 25 MG 24 hr tablet Take 1 tablet (25 mg total) by mouth daily. 90 tablet 3   Multiple Vitamin (MULTIVITAMIN WITH MINERALS) TABS tablet Take 1 tablet by mouth daily. Centrum Silver     Omega-3 Fatty Acids (FISH OIL) 1200 MG CAPS Take 1,200 mg by mouth daily.     ONETOUCH ULTRA test strip daily.     pantoprazole (PROTONIX) 40 MG tablet Take 1 tablet (40 mg total) by mouth daily at 6 (six) AM. 30 tablet 0   polyethylene glycol (MIRALAX / GLYCOLAX) packet Take 17 g by mouth daily. Mix with 8 oz liquid and drink     prednisoLONE acetate (PRED FORTE) 1 % ophthalmic  suspension Place 1 drop into the left eye daily.     rosuvastatin (CRESTOR) 20 MG tablet Take 20 mg by mouth daily.     silodosin (RAPAFLO) 8 MG CAPS capsule Take 8 mg by mouth daily.     spironolactone (ALDACTONE) 25 MG tablet Take 0.5 tablets (12.5 mg total) by mouth daily. 45 tablet 3   Zinc 50 MG CAPS Take 50 mg by mouth daily with supper.      losartan (COZAAR) 50 MG tablet Take 1 tablet (50 mg total) by mouth daily. 90 tablet 3   No current facility-administered medications for this visit.    Allergies:   Atorvastatin, Oysters [shellfish allergy], and Sulfa antibiotics    Social History:  The patient  reports that he quit  smoking about 33 years ago. His smoking use included cigarettes. He has a 25.00 pack-year smoking history. He has never used smokeless tobacco. He reports that he does not drink alcohol and does not use drugs.   Family History:  The patient's family history includes Cancer in his father and mother.    ROS:  Please see the history of present illness.   Otherwise, review of systems are positive for none.   All other systems are reviewed and negative.    PHYSICAL EXAM: VS:  BP 128/70 (BP Location: Left Arm, Patient Position: Sitting, Cuff Size: Normal)   Pulse (!) 52   Ht 6' (1.829 m)   Wt 185 lb 9.6 oz (84.2 kg)   SpO2 98%   BMI 25.17 kg/m  , BMI Body mass index is 25.17 kg/m. GENERAL:  Well appearing WM in NAD HEENT:  PERRL, EOMI, sclera are clear. Oropharynx is clear. NECK:  No jugular venous distention, carotid upstroke brisk and symmetric, no bruits, no thyromegaly or adenopathy LUNGS:  Clear to auscultation bilaterally CHEST:  Unremarkable HEART:  RRR with frequent extrasystoles,  PMI not displaced or sustained,S1 and S2 within normal limits, no S3, no S4: no clicks, no rubs, no murmurs ABD:  Soft, nontender. BS +, no masses or bruits. No hepatomegaly, no splenomegaly EXT:  2 + pulses throughout, no edema, no cyanosis no clubbing. Radial site without  hematoma.  SKIN:  Warm and dry.  No rashes NEURO:  Alert and oriented x 3. Cranial nerves II through XII intact. PSYCH:  Cognitively intact  EKG:  EKG is not  ordered today.     Recent Labs: 07/30/2021: ALT 25 08/02/2021: Magnesium 2.1 08/05/2021: BUN 9; Creatinine, Ser 0.91; Hemoglobin 10.8; Platelets 235; Potassium 3.7; Sodium 134    Lipid Panel    Component Value Date/Time   CHOL 91 11/05/2020 0243   TRIG 80 11/05/2020 0243   HDL 35 (L) 11/05/2020 0243   CHOLHDL 2.6 11/05/2020 0243   VLDL 16 11/05/2020 0243   LDLCALC 40 11/05/2020 0243      Wt Readings from Last 3 Encounters:  03/02/22 185 lb 9.6 oz (84.2 kg)  09/27/21 186 lb (84.4 kg)  08/05/21 182 lb 6.4 oz (82.7 kg)      Other studies Reviewed: Additional studies/ records that were reviewed today include:  November 2017. Cholesterol 164, triglycerides 300, HDL 32 LDL 83. A1c 6.7%. Dated 12/28/16: glucose 150, A1c 7%. Cholesterol 94, triglyderdes 198, HDL 30, LDL 38. Other chemistries normal.  Dated 07/06/17. Glucose 155, otherwise CMET normal. Cholesterol 109, triglycerides 164, HDL 35, LDL 59.  A1c 6.5%. Dated 07/17/18: cholesterol 108, triglycerides 167, HDL 38, LDL 55. Glucose 142. Otherwise CMET normal. A1c 6.7% Dated 07/22/19: cholesterol 103, triglycerides 187, HDL 36, LDL 53.  Dated 08/17/19: glucose 147. Otherwise CMET normal. Hgb 13.9.  Dated 01/23/20: Glucose 127. Otherwise CMET normal. Dated 07/27/20: glucose 165, otherwise CMET normal. Cholesterol 108, triglycerides 155, HDL 35, LDL 54. A1c 6.6%.    Myoview 10/21/20: Study Highlights  Nuclear stress EF: 22%. The left ventricular ejection fraction is severely decreased (<30%). Defect 1: There is a defect present in the mid anterior, mid anteroseptal, mid inferolateral, mid anterolateral, apical anterior, apical inferior, apical lateral and apex location. Findings consistent with prior large anterior apical myocardial infarction. This is a high risk study.  There is no evidence of ischemia . He has had a previous anterior apical MI   Cardiac cath 10/27/20:  RIGHT/LEFT HEART CATH AND CORONARY ANGIOGRAPHY  Conclusion    Ost LAD to Prox LAD lesion is 95% stenosed. 1st Diag lesion is 70% stenosed. Previously placed Ost Cx to Prox Cx stent (unknown type) is widely patent. Previously placed LPDA stent (unknown type) is widely patent. Ost RCA to Prox RCA lesion is 90% stenosed. There is severe left ventricular systolic dysfunction. LV end diastolic pressure is normal. The left ventricular ejection fraction is 25-35% by visual estimate.   1. Severe 2 vessel obstructive CAD.    - 90% ostial LAD. Severely calcified. 70% first diagonal    - patent stents in the proximal LCx and left PDA    - 90% ostial nondominant RCA 2. Severe LV dysfunction with regional wall motion abnormalities. 3. Normal LV filling pressures 4. Normal right heart pressures. 5. Preserved cardiac output.    Plan: recommend staged complex  PCI of the ostial LAD with atherectomy and stenting. The diagonal and RCA disease are old and involve small vessels. While CABG would be an option given his advanced age I would favor a percutaneous approach.    PCI 11/04/20: Procedures  CORONARY STENT INTERVENTION  CORONARY ATHERECTOMY  Intravascular Ultrasound/IVUS    Conclusion    Ost LAD to Prox LAD lesion is 95% stenosed. A drug-eluting stent was successfully placed using a SYNERGY XD [id number]. Post intervention, there is a 0% residual stenosis.   1. Successful PCI of the ostial LAD with IVUS guidance, orbital atherectomy and DES x 1   Plan: DAPT for at least 6 months. Will observe overnight and anticipate DC in am.     Recommendations  Antiplatelet/Anticoag Recommend uninterrupted dual antiplatelet therapy with Aspirin '81mg'$  daily and Clopidogrel '75mg'$  daily for a minimum of 6 months (stable ischemic heart disease-Class I recommendation   Coronary  Diagrams   Diagnostic Dominance: Left    Intervention     Implants      Echo 11/05/20: IMPRESSIONS     1. Diffuse hypokinesis worse in inferior base with abnormal septal motion  . Left ventricular ejection fraction, by estimation, is 25 to 30%. The  left ventricle has severely decreased function. The left ventricle  demonstrates global hypokinesis. The left   ventricular internal cavity size was moderately dilated. There is mild  left ventricular hypertrophy. Left ventricular diastolic parameters are  indeterminate.   2. Right ventricular systolic function is normal. The right ventricular  size is normal. There is normal pulmonary artery systolic pressure.   3. The mitral valve is normal in structure. Mild mitral valve  regurgitation. No evidence of mitral stenosis.   4. The aortic valve is tricuspid. Aortic valve regurgitation is trivial.  No aortic stenosis is present.   5. The inferior vena cava is dilated in size with >50% respiratory  variability, suggesting right atrial pressure of 8 mmHg.    Echo 01/19/21: IMPRESSIONS     1. Left ventricular ejection fraction, by estimation, is 45 to 50%. The  left ventricle has mildly decreased function. The left ventricle  demonstrates regional wall motion abnormalities (see scoring  diagram/findings for description). Left ventricular  diastolic parameters are consistent with Grade I diastolic dysfunction  (impaired relaxation). There is severe akinesis of the left ventricular,  basal inferior wall.   2. Right ventricular systolic function is normal. The right ventricular  size is normal. There is normal pulmonary artery systolic pressure.   3. Right atrial size was mildly dilated.   4. The mitral valve is normal in structure. Trivial mitral valve  regurgitation.   5. The  aortic valve is tricuspid. Aortic valve regurgitation is mild.   6. There is dilatation of the ascending aorta.   ASSESSMENT AND PLAN:  1. Coronary  disease status post remote stenting of the left circumflex in 2000 with a bare-metal stent. Cath  in 2016 showed  Patent stents in LCx. New disease in diagonal and ostial nondominant RCA  that correlate with stress test results. In 2022  had High risk Myoview study. EF down to 25-30%. Cardiac cath demonstrated critical ostial LAD stenosis. Now s/p successful PCI with atherectomy and DES in April 2022.  Fortunately LV function improved significantly with revascularization. Will discontinue Plavix now with history of GI bleed. Continue ASA and metoprolol  2. Acute on chronic systolic CHF secondary to ischemic CM. S/p revascularization as noted. Will continue Toprol XL to 25 mg daily. continue Aldactone to 12.5 mg daily. Increase  previously unable to afford Entresto. Will increase losartan to 50 mg daily since BP elevated. Would avoid SGLT 2 inhibitor given hx of bladder yeast infection. Repeat Echo showed significant improvement in EF to 45-50%.   3. Hypertension- increase losartan.  4. Hyperlipidemia- cholesterol well controlled on Crestor.   5. Diabetes mellitus.  Reviewed recommendations for low carb diet and weight loss. Per Dr. Garlon Hatchet.  6. UTI with obstructive uropathy and hematuria. No active symptoms other than a little hesitancy.   7. S/p recent diverticular bleed. Resolved. I  8. ? Bradycardia. I think pulse is falsely low due to PVCs. Will continue Toprol    Disposition:   FU with me in 6 months   Signed, Denea Cheaney [name], MD  03/02/2022 11:21 AM    [facility] [address], [place], [place]  [address] Phone: [phone]; Fax: [phone]

## 2022-03-02 ENCOUNTER — Ambulatory Visit: Payer: Medicare Other | Admitting: Cardiology

## 2022-03-02 ENCOUNTER — Encounter: Payer: Self-pay | Admitting: Cardiology

## 2022-03-02 VITALS — BP 128/70 | HR 52 | Ht 72.0 in | Wt 185.6 lb

## 2022-03-02 DIAGNOSIS — I2510 Atherosclerotic heart disease of native coronary artery without angina pectoris: Secondary | ICD-10-CM | POA: Diagnosis not present

## 2022-03-02 DIAGNOSIS — I5022 Chronic systolic (congestive) heart failure: Secondary | ICD-10-CM | POA: Diagnosis not present

## 2022-03-02 DIAGNOSIS — I10 Essential (primary) hypertension: Secondary | ICD-10-CM | POA: Diagnosis not present

## 2022-03-02 DIAGNOSIS — E782 Mixed hyperlipidemia: Secondary | ICD-10-CM

## 2022-03-02 MED ORDER — LOSARTAN POTASSIUM 50 MG PO TABS: 50.0000 mg | ORAL_TABLET | Freq: Every day | ORAL | 3 refills | Status: DC

## 2022-03-02 NOTE — Patient Instructions (Signed)
Increase losartan to 50 mg daily  Stop Plavix.  Continue your other therapy  Increase your aerobic activity.

## 2022-03-21 ENCOUNTER — Other Ambulatory Visit: Payer: Self-pay

## 2022-03-21 ENCOUNTER — Inpatient Hospital Stay (HOSPITAL_COMMUNITY)
Admission: EM | Admit: 2022-03-21 | Discharge: 2022-03-23 | DRG: 378 | Disposition: A | Discharge status: Home / Self Care | Payer: Medicare Other | Attending: Family Medicine | Admitting: Family Medicine

## 2022-03-21 DIAGNOSIS — E785 Hyperlipidemia, unspecified: Secondary | ICD-10-CM | POA: Diagnosis present

## 2022-03-21 DIAGNOSIS — Z87891 Personal history of nicotine dependence: Secondary | ICD-10-CM

## 2022-03-21 DIAGNOSIS — I493 Ventricular premature depolarization: Secondary | ICD-10-CM | POA: Diagnosis not present

## 2022-03-21 DIAGNOSIS — I130 Hypertensive heart and chronic kidney disease with heart failure and stage 1 through stage 4 chronic kidney disease, or unspecified chronic kidney disease: Secondary | ICD-10-CM | POA: Diagnosis present

## 2022-03-21 DIAGNOSIS — Z955 Presence of coronary angioplasty implant and graft: Secondary | ICD-10-CM | POA: Diagnosis not present

## 2022-03-21 DIAGNOSIS — N182 Chronic kidney disease, stage 2 (mild): Secondary | ICD-10-CM | POA: Diagnosis present

## 2022-03-21 DIAGNOSIS — Z888 Allergy status to other drugs, medicaments and biological substances status: Secondary | ICD-10-CM

## 2022-03-21 DIAGNOSIS — K5731 Diverticulosis of large intestine without perforation or abscess with bleeding: Principal | ICD-10-CM | POA: Diagnosis present

## 2022-03-21 DIAGNOSIS — Z91013 Allergy to seafood: Secondary | ICD-10-CM | POA: Diagnosis not present

## 2022-03-21 DIAGNOSIS — Z6823 Body mass index (BMI) 23.0-23.9, adult: Secondary | ICD-10-CM

## 2022-03-21 DIAGNOSIS — I2510 Atherosclerotic heart disease of native coronary artery without angina pectoris: Secondary | ICD-10-CM | POA: Diagnosis not present

## 2022-03-21 DIAGNOSIS — I5042 Chronic combined systolic (congestive) and diastolic (congestive) heart failure: Secondary | ICD-10-CM | POA: Diagnosis present

## 2022-03-21 DIAGNOSIS — K5900 Constipation, unspecified: Secondary | ICD-10-CM | POA: Diagnosis present

## 2022-03-21 DIAGNOSIS — K922 Gastrointestinal hemorrhage, unspecified: Principal | ICD-10-CM | POA: Diagnosis present

## 2022-03-21 DIAGNOSIS — Z882 Allergy status to sulfonamides status: Secondary | ICD-10-CM | POA: Diagnosis not present

## 2022-03-21 DIAGNOSIS — I255 Ischemic cardiomyopathy: Secondary | ICD-10-CM | POA: Diagnosis present

## 2022-03-21 DIAGNOSIS — K648 Other hemorrhoids: Secondary | ICD-10-CM | POA: Diagnosis present

## 2022-03-21 DIAGNOSIS — I5043 Acute on chronic combined systolic (congestive) and diastolic (congestive) heart failure: Secondary | ICD-10-CM | POA: Diagnosis present

## 2022-03-21 DIAGNOSIS — K2100 Gastro-esophageal reflux disease with esophagitis, without bleeding: Secondary | ICD-10-CM | POA: Diagnosis present

## 2022-03-21 DIAGNOSIS — K921 Melena: Secondary | ICD-10-CM | POA: Diagnosis not present

## 2022-03-21 DIAGNOSIS — Z96659 Presence of unspecified artificial knee joint: Secondary | ICD-10-CM

## 2022-03-21 DIAGNOSIS — K2990 Gastroduodenitis, unspecified, without bleeding: Secondary | ICD-10-CM | POA: Diagnosis present

## 2022-03-21 DIAGNOSIS — Z96653 Presence of artificial knee joint, bilateral: Secondary | ICD-10-CM | POA: Diagnosis present

## 2022-03-21 DIAGNOSIS — E876 Hypokalemia: Secondary | ICD-10-CM

## 2022-03-21 DIAGNOSIS — I252 Old myocardial infarction: Secondary | ICD-10-CM

## 2022-03-21 DIAGNOSIS — R790 Abnormal level of blood mineral: Secondary | ICD-10-CM | POA: Diagnosis not present

## 2022-03-21 DIAGNOSIS — R54 Age-related physical debility: Secondary | ICD-10-CM | POA: Diagnosis present

## 2022-03-21 DIAGNOSIS — E1122 Type 2 diabetes mellitus with diabetic chronic kidney disease: Secondary | ICD-10-CM | POA: Diagnosis present

## 2022-03-21 DIAGNOSIS — E1165 Type 2 diabetes mellitus with hyperglycemia: Secondary | ICD-10-CM | POA: Diagnosis present

## 2022-03-21 LAB — CBC WITH DIFFERENTIAL/PLATELET
Abs Immature Granulocytes: 0.03 10*3/uL (ref 0.00–0.07)
Basophils Absolute: 0.1 10*3/uL (ref 0.0–0.1)
Basophils Relative: 1 %
Eosinophils Absolute: 0.1 10*3/uL (ref 0.0–0.5)
Eosinophils Relative: 1 %
HCT: 42.3 % (ref 39.0–52.0)
Hemoglobin: 14.2 g/dL (ref 13.0–17.0)
Immature Granulocytes: 0 %
Lymphocytes Relative: 18 %
Lymphs Abs: 1.9 10*3/uL (ref 0.7–4.0)
MCH: 30 pg (ref 26.0–34.0)
MCHC: 33.6 g/dL (ref 30.0–36.0)
MCV: 89.4 fL (ref 80.0–100.0)
Monocytes Absolute: 0.5 10*3/uL (ref 0.1–1.0)
Monocytes Relative: 5 %
Neutro Abs: 7.7 10*3/uL (ref 1.7–7.7)
Neutrophils Relative %: 75 %
Platelets: 261 10*3/uL (ref 150–400)
RBC: 4.73 MIL/uL (ref 4.22–5.81)
RDW: 13.6 % (ref 11.5–15.5)
WBC: 10.2 10*3/uL (ref 4.0–10.5)
nRBC: 0 % (ref 0.0–0.2)

## 2022-03-21 LAB — COMPREHENSIVE METABOLIC PANEL
ALT: 21 U/L (ref 0–44)
AST: 25 U/L (ref 15–41)
Albumin: 3.7 g/dL (ref 3.5–5.0)
Alkaline Phosphatase: 71 U/L (ref 38–126)
Anion gap: 10 (ref 5–15)
BUN: 13 mg/dL (ref 8–23)
CO2: 19 mmol/L — ABNORMAL LOW (ref 22–32)
Calcium: 8.8 mg/dL — ABNORMAL LOW (ref 8.9–10.3)
Chloride: 106 mmol/L (ref 98–111)
Creatinine, Ser: 0.92 mg/dL (ref 0.61–1.24)
GFR, Estimated: >60 mL/min (ref >60)
Glucose, Bld: 233 mg/dL — ABNORMAL HIGH (ref 70–99)
Potassium: 4.5 mmol/L (ref 3.5–5.1)
Sodium: 135 mmol/L (ref 135–145)
Total Bilirubin: 0.8 mg/dL (ref 0.3–1.2)
Total Protein: 5.9 g/dL — ABNORMAL LOW (ref 6.5–8.1)

## 2022-03-21 LAB — PROTIME-INR
INR: 1.1 (ref 0.8–1.2)
Prothrombin Time: 14.1 seconds (ref 11.4–15.2)

## 2022-03-21 LAB — HEMOGLOBIN AND HEMATOCRIT, BLOOD
HCT: 41.5 % (ref 39.0–52.0)
HCT: 36.3 % — ABNORMAL LOW (ref 39.0–52.0)
Hemoglobin: 13.7 g/dL (ref 13.0–17.0)
Hemoglobin: 12.2 g/dL — ABNORMAL LOW (ref 13.0–17.0)

## 2022-03-21 LAB — SAMPLE TO BLOOD BANK

## 2022-03-21 LAB — TYPE AND SCREEN
ABO/RH(D): A POS
Antibody Screen: NEGATIVE

## 2022-03-21 LAB — BRAIN NATRIURETIC PEPTIDE: B Natriuretic Peptide: 192.8 pg/mL — ABNORMAL HIGH (ref 0.0–100.0)

## 2022-03-21 MED ORDER — METOPROLOL SUCCINATE ER 25 MG PO TB24
25.0000 mg | ORAL_TABLET | Freq: Every day | ORAL | Status: DC
  Administered 2022-03-21: 25 mg via ORAL
  Filled 2022-03-21 (×3): qty 1

## 2022-03-21 MED ORDER — PANTOPRAZOLE SODIUM 40 MG IV SOLR
40.0000 mg | Freq: Once | INTRAVENOUS | Status: AC
  Administered 2022-03-21: 40 mg via INTRAVENOUS
  Filled 2022-03-21: qty 10

## 2022-03-21 MED ORDER — ROSUVASTATIN CALCIUM 20 MG PO TABS
20.0000 mg | ORAL_TABLET | Freq: Every day | ORAL | Status: DC
  Administered 2022-03-21 – 2022-03-23 (×3): 20 mg via ORAL
  Filled 2022-03-21 (×3): qty 1

## 2022-03-21 MED ORDER — SODIUM CHLORIDE 0.9 % IV BOLUS
250.0000 mL | Freq: Once | INTRAVENOUS | Status: AC
  Administered 2022-03-21: 250 mL via INTRAVENOUS

## 2022-03-21 MED ORDER — LOSARTAN POTASSIUM 50 MG PO TABS
50.0000 mg | ORAL_TABLET | Freq: Every day | ORAL | Status: DC
  Administered 2022-03-22 – 2022-03-23 (×2): 50 mg via ORAL
  Filled 2022-03-21 (×2): qty 1

## 2022-03-21 MED ORDER — PANTOPRAZOLE SODIUM 40 MG PO TBEC
40.0000 mg | DELAYED_RELEASE_TABLET | Freq: Two times a day (BID) | ORAL | Status: DC
  Administered 2022-03-21 – 2022-03-23 (×4): 40 mg via ORAL
  Filled 2022-03-21 (×4): qty 1

## 2022-03-21 NOTE — H&P (Addendum)
Hospital Admission History and Physical Service Pager: [phone]  Patient name: John Gamble Medical record number: [id number] Date of Birth: 08-Nov-1930 Age: 88 y.o. Gender: male  Primary Care Provider: Algis Greenhouse, MD Consultants: John Gamble: Full  Preferred Emergency Contact: John Gamble (spouse) [phone], [phone] (cell)   Chief Complaint: bright red blood per rectum   Assessment and Plan: John Gamble is a 88 y.o. male presenting with bright red blood per rectum . Differential for this patient's presentation of this includes diverticular bleed, internal hemorrhoids and John illness. Low concern for malignancy. Lack of pain out of proportion so less concern for mesenteric ischemia. Likely diverticular bleed, consulted John and awaiting recommendations for possible EGD/colonoscopy. Plan to admit for observation.   * Lower John bleed Noted to have 4-5 episodes of hematochezia, Hgb stable at 14 with baseline appeared to be around 10-11. Transfusion threshold of 8 given history of CAD. Recent EGD and colonoscopy performed Jan 2023 which was notable for gastritis and diverticular disease. -Admit to FPTS, attending Dr. Andria Gamble -consulted John, appreciate recommendations  -hold aspirin  -NPO pending John recs in case EGD and/or colonoscopy would be appropriate  -cardiac telemetry  -plan to transfuse if Hgb <8 -2 large bore IVs -IV protonix 40 mg -monitor I/Os -H&H q6h -f/u am CBC -vitals per routine  -SCDs for DVT ppx  -PT/OT eval & treat  Acute on chronic combined systolic and diastolic CHF (congestive heart failure) ([place]) Most recent echo in June 2022 demonstrated EF 45-50% with mildly decreased left ventricular function and mild aortic valve regurgitation. Home meds include spironolactone. Mild trace pitting edema noted along lower extremities bilaterally on exam.  -hold spironolactone pending formal med rec and in the setting of volume loss -monitor I/Os -consider repeat  echo is patient is requiring fluids   Hypertension More recently normotensive, home meds include losartan 50 mg. -hold losartan given volume loss -monitor BP closely, restart losartan as appropriate   Coronary artery disease With history of MI, was on plavix for a long period of time which was recently discontinued earlier in the month by his cardiologist. -hold aspirin -transfusion threshold 8     Other conditions, chronic and stable: Hyperlipidemia: Continue rosuvastatin. Type 2 DM: Last A1c 7.2 a month ago. No home meds. Monitor with daily glucose level, plan to obtain CBGs as appropriate.   FEN/John: NPO pending John recs VTE Prophylaxis: SCDs  Disposition: admit to med tele, attending Dr. Andria Gamble   History of Present Illness:  John Gamble is a 88 y.o. male presenting with hematochezia. History also obtained from wife. Starting yesterday morning, he noticed bright red blood in the toilet bowl only with stool. Denies hematuria or melena. Has had 4-5 episodes similar to this since then. Unable to quantify amount but wife says that it covers most of the toilet bowl from what she can tell. Reports mild generalized abdominal pain. Denies any new dizziness, lightheadedness, vision changes, nausea, vomiting, chest pain and dyspnea. Only had a headache. Denies any new foods. Recently saw his cardiologist and was told to stop taking plavix. Was taking aspirin and plavix for a very long time, unsure of exact amount but cardiologist stopped plavix 8/2 and has not taken any since then. At the same visit, his losartan was also increased to 50 mg as well. Only taking aspirin now, last took it yesterday morning. Has had similar episodes like this in the past, it has been a few years, says wife.  EGD and colonoscopy were both done for this reason. Denies any personal or family history of colon cancer. Last meal was yesterday afternoon.   In the ED, reported to have another episode of hematochezia. Mildly  tachycardic to low 100s at one point but otherwise stable vitals. Hgb 13-14, stable thus far this admission.   Review Of Systems: Per HPI above   Pertinent Past Medical History: Prediabetes Hypertension Hyperlipidemia CAD with hx of MI  Prior John bleed with hx of diverticular disease   Pertinent Past Surgical History: Prior stent placement   Pertinent Social History: Tobacco use: Former, 50-60 years ago  Alcohol use: no  Other Substance use: no Lives with wife   Pertinent Family History: none  Remainder reviewed in history tab.   Important Outpatient Medications: Aspirin 81 mg Losartan 50 mg  Metoprolol succinate 25 mg Pantoprazole 40 mg  Rosuvastatin 20 mg  Spironolactone 25 mg   Last took meds last night   Remainder reviewed in medication history.   Objective: BP (!) 127/49 (BP Location: Right Arm)   Pulse (!) 104   Temp (!) 97.4 F (36.3 C) (Temporal)   Resp 20   SpO2 98%  Exam: General: Patient resting comfortably, in no acute distress. Eyes: PERRLA ENTM: normal buccal mucosa, moist mucous membranes Neck: non-tender thyroid, no evidence of cervical LAD Cardiovascular: RRR, no murmurs or gallops auscultated Respiratory: CTAB, no wheezing, rales or rhonchi noted  Gastrointestinal: soft, mild LLQ tenderness upon deep palpation, nondistended, presence of bowel sounds  Ext: 1+ pitting right LE edema and trace pitting left LE edema, radial and distal pulses strong and equal bilaterally, capillary refill 2-3 sec  Derm: no rashes noted Neuro: AOx4  Psych: mood appropriate   Labs:  CBC BMET  Recent Labs  Lab 03/21/22 0225 03/21/22 0640  WBC 10.2  --   HGB 14.2 13.7  HCT 42.3 41.5  PLT 261  --    Recent Labs  Lab 03/21/22 0225  NA 135  K 4.5  CL 106  CO2 19*  BUN 13  CREATININE 0.92  GLUCOSE 233*  CALCIUM 8.8*     EKG: HR 90, normal QT, mildly elevated T waves but not noted diffusely, EKG changes noted from prior reading that was noted to have  left ventricular hypertrophy    Imaging Studies Performed: No results found.     Donney Dice, DO 03/21/2022, 9:11 AM PGY-3, [place] Intern pager: [phone], text pages welcome Secure chat group [place]

## 2022-03-21 NOTE — ED Notes (Signed)
Family medicine notified of BM. Orders place to check H&H in the morning

## 2022-03-21 NOTE — Progress Notes (Signed)
FMTS Brief Progress Note  S: patient has no questions or concerns. He denies any recurring passage of blood per rectum.   O: BP (!) 154/83   Pulse 65   Temp 97.6 F (36.4 C) (Oral)   Resp 17   SpO2 97%    Generally appears well, in no acute distress. He was sleeping comfortably in bed prior to being awaken. Patient had a pleasant disposition.  A/P: No acute changes. Continue to monitor for recurrence of hematochezia, continue to check labs, trend Hb. - Orders reviewed. Labs for AM ordered, which was adjusted as needed.   Camelia Phenes, MD 03/21/2022, 9:20 PM PGY-1, [facility] Night Resident  Please page [phone] with questions.

## 2022-03-21 NOTE — ED Notes (Signed)
Pt up at bedside to use urinal. Pt had a dark red bloody loose BM.

## 2022-03-21 NOTE — ED Notes (Signed)
Clear liquid diet given tolerated well.

## 2022-03-21 NOTE — Assessment & Plan Note (Addendum)
With history of MI, was on plavix for a long period of time which was recently discontinued earlier in the month by his cardiologist. -hold aspirin -transfusion threshold 8

## 2022-03-21 NOTE — Assessment & Plan Note (Addendum)
Most recent echo in June 2022 demonstrated EF 45-50% with mildly decreased left ventricular function and mild aortic valve regurgitation. Home meds include spironolactone. Mild trace pitting edema noted along lower extremities bilaterally on exam.  -monitor I/Os -consider repeat echo outpatient

## 2022-03-21 NOTE — Consult Note (Addendum)
Consultation  Referring Provider:  Dr. Andria Frames Primary Care Physician:  Algis Greenhouse, MD Primary Gastroenterologist: Dr. Earlean Shawl, [facility] Reason for Consultation:  GI Bleed            HPI:   [facility] is a 88 y.o. male with a past medical history as listed below including CAD previously on Plavix and Aspirin which have since been stopped, ischemic cardiomyopathy with EF of 45-50%, CKD stage III, diabetes and bilateral knee replacements, who presented to the hospital today with a complaint of GI bleeding.    07/30/2021 patient consulted by our service for GI bleed.  Was experiencing dark red blood per rectum.  At that time hemodynamically stable with a hemoglobin of 14.4.  BUN 16.  Noted history of CAD status post drug-eluting stent to the LAD in April 2022 on Plavix and aspirin.  Patient was observed for a few days and eventually underwent EGD and colonoscopy 08/04/2021.    08/04/2021 colonoscopy with diverticulosis in the entire examined colon and bleed was felt to be a self-limited diverticular bleed.  EGD on the same day with mild reflux esophagitis and mild erosive gastroduodenitis.  At that time was recommended he stay on Pantoprazole 40 mg daily.    03/02/2022 patient saw cardiologist and they discontinued Plavix.  He was continued on Aspirin.    Today, patient is seen with his wife by his side who does assist with history.  They explained that he started having some bright red blood in the toilet with a bowel movement yesterday morning and had about 4 episodes yesterday and then 1 after arriving to the ER in the waiting room.  He has not had any over the past 6 hours or so.  Does complain of a vague abdominal discomfort around his umbilicus.  They explained that he battles constipation and wife thinks he may have been having trouble recently.  Patient tells me he uses MiraLAX.  His last dose of Aspirin was yesterday.    Denies fever, chills, weight loss,  shortness of breath, dizziness or palpitations.  ER course: Hemoglobin 14.1 mildly tachycardic to low 100s  GI history: As above  Past Medical History:  Diagnosis Date   Coronary artery disease    Hyperlipidemia    Hypertension    MI, old    OA (osteoarthritis)    Pneumonia    hx of 2013     Past Surgical History:  Procedure Laterality Date   BIOPSY  08/04/2021   Procedure: BIOPSY;  Surgeon: Irene Shipper, MD;  Location: [facility] ENDOSCOPY;  Service: Endoscopy;;   CARDIAC CATHETERIZATION  01/26/1999   EF 55%   CARDIAC CATHETERIZATION N/A 05/06/2015   Procedure: Left Heart Cath and Coronary Angiography;  Surgeon: Peter M [name], MD;  Location: [place] [facility] LAB;  Service: Cardiovascular;  Laterality: N/A;   CARDIOVASCULAR STRESS TEST  02/22/2010   EF 61%   COLONOSCOPY WITH PROPOFOL N/A 08/04/2021   Procedure: COLONOSCOPY WITH PROPOFOL;  Surgeon: Irene Shipper, MD;  Location: [facility] ENDOSCOPY;  Service: Endoscopy;  Laterality: N/A;   corneal implant     left   CORONARY ATHERECTOMY N/A 11/04/2020   Procedure: CORONARY ATHERECTOMY;  Surgeon: [name], Peter M, MD;  Location: [place] [facility] LAB;  Service: Cardiovascular;  Laterality: N/A;   CORONARY STENT INTERVENTION N/A 11/04/2020   Procedure: CORONARY STENT INTERVENTION;  Surgeon: [name], Peter M, MD;  Location: [place] [facility];  Service: Cardiovascular;  Laterality: N/A;   CORONARY STENT PLACEMENT  2000   BMS LEFT CIRCUMFLEX CORONARY   ESOPHAGOGASTRODUODENOSCOPY N/A 08/04/2021   Procedure: ESOPHAGOGASTRODUODENOSCOPY (EGD);  Surgeon: Irene Shipper, MD;  Location: [facility] ENDOSCOPY;  Service: Endoscopy;  Laterality: N/A;   INGUINAL HERNIA REPAIR     INTRAVASCULAR ULTRASOUND/IVUS N/A 11/04/2020   Procedure: Intravascular Ultrasound/IVUS;  Surgeon: [name], Peter M, MD;  Location: [place] [facility] LAB;  Service: Cardiovascular;  Laterality: N/A;   REPLACEMENT TOTAL KNEE     RIGHT/LEFT HEART CATH AND CORONARY ANGIOGRAPHY N/A 10/27/2020   Procedure:  RIGHT/LEFT HEART CATH AND CORONARY ANGIOGRAPHY;  Surgeon: [name], Peter M, MD;  Location: [place] [facility] LAB;  Service: Cardiovascular;  Laterality: N/A;   TOTAL KNEE ARTHROPLASTY Left 09/19/2014   Procedure: LEFT TOTAL KNEE ARTHROPLASTY;  Surgeon: Sydnee Cabal, MD;  Location: [facility] ORS;  Service: Orthopedics;  Laterality: Left;    Family History  Problem Relation Age of Onset   Cancer Mother    Cancer Father     Social History   Tobacco Use   Smoking status: Former    Packs/day: 0.50    Years: 50.00    Total pack years: 25.00    Types: Cigarettes    Quit date: 02/23/1989    Years since quitting: 33.0   Smokeless tobacco: Never  Vaping Use   Vaping Use: Never used  Substance Use Topics   Alcohol use: No   Drug use: No    Prior to Admission medications   Medication Sig Start Date End Date Taking? Authorizing Provider  acyclovir (ZOVIRAX) 800 MG tablet Take 800 mg by mouth daily.  03/03/14   [provider]  aspirin EC 81 MG tablet Take 81 mg by mouth daily.    [provider]  dorzolamide (TRUSOPT) 2 % ophthalmic solution Place 1 drop into the right eye at bedtime.    [provider]  finasteride (PROSCAR) 5 MG tablet Take 1 tablet (5 mg total) by mouth daily. 11/20/20   [name], Peter M, MD  Glucosamine-Chondroitin 250-200 MG TABS Take 2 tablets by mouth 2 (two) times daily.    [provider]  glucose blood (ONETOUCH ULTRA) test strip USE 1 STRIP DAILY. 08/20/20   [provider]  Lancets (ONETOUCH DELICA PLUS [id number]) [place] SMARTSIG:1 Unit(s) Topical Daily 07/01/20   [provider]  losartan (COZAAR) 50 MG tablet Take 1 tablet (50 mg total) by mouth daily. 03/02/22 02/25/23  [name], Peter M, MD  metoprolol succinate (TOPROL-XL) 25 MG 24 hr tablet Take 1 tablet (25 mg total) by mouth daily. 09/21/21   [name], Peter M, MD  Multiple Vitamin (MULTIVITAMIN WITH MINERALS) TABS tablet Take 1 tablet by mouth daily. Centrum Silver     [provider]  Omega-3 Fatty Acids (FISH OIL) 1200 MG CAPS Take 1,200 mg by mouth daily.    [provider]  [facility] ULTRA test strip daily. 08/20/20   [provider]  pantoprazole (PROTONIX) 40 MG tablet Take 1 tablet (40 mg total) by mouth daily at 6 (six) AM. 08/06/21   Orvis Brill, MD  polyethylene glycol (MIRALAX / Floria Raveling) packet Take 17 g by mouth daily. Mix with 8 oz liquid and drink    [provider]  prednisoLONE acetate (PRED FORTE) 1 % ophthalmic suspension Place 1 drop into the left eye daily. 09/12/20   [provider]  rosuvastatin (CRESTOR) 20 MG tablet Take 20 mg by mouth daily.    [provider]  silodosin (  RAPAFLO) 8 MG CAPS capsule Take 8 mg by mouth daily. 12/04/20   [provider]  spironolactone (ALDACTONE) 25 MG tablet Take 0.5 tablets (12.5 mg total) by mouth daily. 01/13/22   [name], Peter M, MD  Zinc 50 MG CAPS Take 50 mg by mouth daily with supper.     [provider]    Current Facility-Administered Medications  Medication Dose Route Frequency Provider Last Rate Last Admin   metoprolol succinate (TOPROL-XL) 24 hr tablet 25 mg  25 mg Oral Daily Ganta, Anupa, DO       rosuvastatin (CRESTOR) tablet 20 mg  20 mg Oral Daily Ganta, Anupa, DO       Current Outpatient Medications  Medication Sig Dispense Refill   acyclovir (ZOVIRAX) 800 MG tablet Take 800 mg by mouth daily.      aspirin EC 81 MG tablet Take 81 mg by mouth daily.     dorzolamide (TRUSOPT) 2 % ophthalmic solution Place 1 drop into the right eye at bedtime.     finasteride (PROSCAR) 5 MG tablet Take 1 tablet (5 mg total) by mouth daily.     Glucosamine-Chondroitin 250-200 MG TABS Take 2 tablets by mouth 2 (two) times daily.     glucose blood (ONETOUCH ULTRA) test strip USE 1 STRIP DAILY.     Lancets (ONETOUCH DELICA PLUS [id number]) [place] SMARTSIG:1 Unit(s) Topical Daily     losartan (COZAAR) 50 MG tablet Take 1 tablet (50 mg  total) by mouth daily. 90 tablet 3   metoprolol succinate (TOPROL-XL) 25 MG 24 hr tablet Take 1 tablet (25 mg total) by mouth daily. 90 tablet 3   Multiple Vitamin (MULTIVITAMIN WITH MINERALS) TABS tablet Take 1 tablet by mouth daily. Centrum Silver     Omega-3 Fatty Acids (FISH OIL) 1200 MG CAPS Take 1,200 mg by mouth daily.     ONETOUCH ULTRA test strip daily.     pantoprazole (PROTONIX) 40 MG tablet Take 1 tablet (40 mg total) by mouth daily at 6 (six) AM. 30 tablet 0   polyethylene glycol (MIRALAX / GLYCOLAX) packet Take 17 g by mouth daily. Mix with 8 oz liquid and drink     prednisoLONE acetate (PRED FORTE) 1 % ophthalmic suspension Place 1 drop into the left eye daily.     rosuvastatin (CRESTOR) 20 MG tablet Take 20 mg by mouth daily.     silodosin (RAPAFLO) 8 MG CAPS capsule Take 8 mg by mouth daily.     spironolactone (ALDACTONE) 25 MG tablet Take 0.5 tablets (12.5 mg total) by mouth daily. 45 tablet 3   Zinc 50 MG CAPS Take 50 mg by mouth daily with supper.       Allergies as of 03/21/2022 - Review Complete 03/21/2022  Allergen Reaction Noted   Atorvastatin Other (See Comments) 06/30/2016   Oysters [shellfish allergy] Nausea Only 12/25/2013   Sulfa antibiotics Nausea And Vomiting 12/25/2013     Review of Systems:    Constitutional: No weight loss, fever or chills Skin: No rash Cardiovascular: No chest pain Respiratory: No SOB  Gastrointestinal: See HPI and otherwise negative Genitourinary: No dysuria  Neurological: No headache, dizziness or syncope Musculoskeletal: No new muscle or joint pain Hematologic: No bruising Psychiatric: No history of depression or anxiety    Physical Exam:  Vital signs in last 24 hours: Temp:  [97.4 F (36.3 C)-98.5 F (36.9 C)] 97.4 F (36.3 C) (08/21 0747) Pulse Rate:  [46-104] 104 (08/21 0747) Resp:  [14-26] 20 (08/21 0747) BP: (  108-166)/(49-90) 127/49 (08/21 0747) SpO2:  [96 %-99 %] 98 % (08/21 0747)   General:   Pleasant, elderly,  caucasian male in NAD, well developed, well nourished, alert and cooperative Head:  Normocephalic and atraumatic. Eyes:   PEERL, EOMI. No icterus. Conjunctiva pink. Ears:  Normal auditory acuity. Neck:  Supple Throat: Oral cavity and pharynx without inflammation, swelling or lesion.  Lungs: Respirations even and unlabored. Lungs clear to auscultation bilaterally.   No wheezes, crackles, or rhonchi.  Heart: Normal S1, S2. No MRG. Regular rate and rhythm. No peripheral edema, cyanosis or pallor.  Abdomen:  Soft, nondistended, nontender. No rebound or guarding. Normal bowel sounds. No appreciable masses or hepatomegaly. Rectal:  Not performed.  Msk:  Symmetrical without gross deformities. Peripheral pulses intact.  Extremities:  Without edema, no deformity or joint abnormality.  Neurologic:  Alert and  oriented x4;  grossly normal neurologically. Skin:   Dry and intact without significant lesions or rashes. Psychiatric: Demonstrates good judgement and reason without abnormal affect or behaviors.   LAB RESULTS: Recent Labs    03/21/22 0225 03/21/22 0640  WBC 10.2  --   HGB 14.2 13.7  HCT 42.3 41.5  PLT 261  --    BMET Recent Labs    03/21/22 0225  NA 135  K 4.5  CL 106  CO2 19*  GLUCOSE 233*  BUN 13  CREATININE 0.92  CALCIUM 8.8*   LFT Recent Labs    03/21/22 0225  PROT 5.9*  ALBUMIN 3.7  AST 25  ALT 21  ALKPHOS 71  BILITOT 0.8   PT/INR Recent Labs    03/21/22 0225  LABPROT 14.1  INR 1.1     Impression / Plan:   Impression: 1.  Lower GI bleed: 4-5 episodes of hematochezia, last about 6 hours ago, hemoglobin stable at 14.2-->13,7 BUN normal. History of diverticular bleed in January while on Plavix and Aspirin. EGD and colonoscopy at that time. Recently stopped Plavix about 2 weeks ago, last dose of baby aspirin 03/20/2022; likely diverticular in nature 2.  Acute on chronic combined systolic and diastolic CHF: Recent EF in June 22 45-50% 3.  CAD: Status post  drug-eluting stents, previously on Plavix and Aspirin, Plavix stopped earlier in August. Aspirin last dose yesterday  Plan: 1.  If further acute bleeding would recommend CTA 2.  Continue to monitor hemoglobin and transfusion as needed less than 8 given history of CAD 3.  Continue Pantoprazole 40 twice daily for now 4.  Patient will likely need to be observed over the next 24 to 48 hours to monitor hemoglobin and bleeding, though this sounds like it has decreased already, no need for repeat EGD/colonoscopy as these were just completed a few months ago 5.  Clear liquid diet today 6.  We will continue to follow while patient is hospitalized  Thank you for your kind consultation, we will continue to follow.  Lavone Nian Lemmon  03/21/2022, 9:30 AM    Attending Physician Note   I have taken a history, reviewed the chart and examined the patient. I performed a substantive portion of this encounter, including complete performance of at least one of the key components, in conjunction with the APP. I agree with the APP's note, impression and recommendations with my edits. My additional impressions and recommendations are as follows.   *Acute painless hematochezia, typical for a diverticular bleed.  If bleeding persists proceed with CTA. If a site is identified then consult IR for consideration of angiogram. Trend CBC.  No plans to repeat colonoscopy or EGD, performed in January 2023.    *CAD. Plavix was discontinued early this month.Hold ASA.   *CHF  Lucio Edward, MD [facility] See [facility], [facility], for our on call provider

## 2022-03-21 NOTE — Assessment & Plan Note (Addendum)
One episode of small amount red blood during bowel movement this morning.  Denies any large volume stools.  Hgb stable. Transfusion threshold of 8 given history of CAD. Recent EGD and colonoscopy performed Jan 2023 which was notable for gastritis and diverticular disease. -consulted GI, appreciate recommendations  -plan to transfuse if Hgb <8, with CAD history -Patient stable for discharge home

## 2022-03-21 NOTE — Assessment & Plan Note (Addendum)
Several readings of >160/90, home meds include losartan 50 mg. -Restart losartan -monitor BP

## 2022-03-21 NOTE — ED Provider Notes (Signed)
[facility] Provider Note   CSN: [id number] Arrival date & time: 03/21/22  0205     History  Chief Complaint  Patient presents with   Rectal Bleeding    John Gamble is a 88 y.o. male.  The history is provided by the patient, medical records and the spouse.  Rectal Bleeding John Gamble is a 88 y.o. male who presents to the Emergency Department complaining of bright red blood per rectum.  He presents to the emergency department accompanied by his wife for evaluation of bright red blood per rectum that started on Sunday morning.  He has had 4-5 grossly bloody bowel movements.  He has mild associated central abdominal discomfort.  No fevers.  He is lightheaded but states this is an ongoing issue for him.  No shortness of breath, nausea, vomiting.  He has experienced similar episode in the past.  He takes 81 mg aspirin daily, this was a recent change and he was previously on Plavix.  He has a history of coronary artery disease status post stent, hypertension.     Home Medications Prior to Admission medications   Medication Sig Start Date End Date Taking? Authorizing Provider  acyclovir (ZOVIRAX) 800 MG tablet Take 800 mg by mouth daily.  03/03/14   [provider]  aspirin EC 81 MG tablet Take 81 mg by mouth daily.    [provider]  dorzolamide (TRUSOPT) 2 % ophthalmic solution Place 1 drop into the right eye at bedtime.    [provider]  finasteride (PROSCAR) 5 MG tablet Take 1 tablet (5 mg total) by mouth daily. 11/20/20   [name], Peter M, MD  Glucosamine-Chondroitin 250-200 MG TABS Take 2 tablets by mouth 2 (two) times daily.    [provider]  glucose blood (ONETOUCH ULTRA) test strip USE 1 STRIP DAILY. 08/20/20   [provider]  Lancets (ONETOUCH DELICA PLUS [id number]) [place] SMARTSIG:1 Unit(s) Topical Daily 07/01/20   [provider]  losartan (COZAAR) 50 MG tablet Take 1 tablet (50 mg  total) by mouth daily. 03/02/22 02/25/23  [name], Peter M, MD  metoprolol succinate (TOPROL-XL) 25 MG 24 hr tablet Take 1 tablet (25 mg total) by mouth daily. 09/21/21   [name], Peter M, MD  Multiple Vitamin (MULTIVITAMIN WITH MINERALS) TABS tablet Take 1 tablet by mouth daily. Centrum Silver    [provider]  Omega-3 Fatty Acids (FISH OIL) 1200 MG CAPS Take 1,200 mg by mouth daily.    [provider]  [facility] ULTRA test strip daily. 08/20/20   [provider]  pantoprazole (PROTONIX) 40 MG tablet Take 1 tablet (40 mg total) by mouth daily at 6 (six) AM. 08/06/21   Orvis Brill, MD  polyethylene glycol (MIRALAX / Floria Raveling) packet Take 17 g by mouth daily. Mix with 8 oz liquid and drink    [provider]  prednisoLONE acetate (PRED FORTE) 1 % ophthalmic suspension Place 1 drop into the left eye daily. 09/12/20   [provider]  rosuvastatin (CRESTOR) 20 MG tablet Take 20 mg by mouth daily.    [provider]  silodosin (RAPAFLO) 8 MG CAPS capsule Take 8 mg by mouth daily. 12/04/20   [provider]  spironolactone (ALDACTONE) 25 MG tablet Take 0.5 tablets (12.5 mg total) by mouth daily. 01/13/22   [name], Peter M, MD  Zinc 50 MG CAPS Take 50 mg by mouth daily with supper.     [provider]  Allergies    Atorvastatin, Oysters [shellfish allergy], and Sulfa antibiotics    Review of Systems   Review of Systems  Gastrointestinal:  Positive for hematochezia.  All other systems reviewed and are negative.   Physical Exam Updated Vital Signs BP 139/71   Pulse (!) 103   Temp 98.5 F (36.9 C) (Oral)   Resp 20   SpO2 97%  Physical Exam Vitals and nursing note reviewed.  Constitutional:      Appearance: He is well-developed.  HENT:     Head: Normocephalic and atraumatic.  Cardiovascular:     Rate and Rhythm: Normal rate and regular rhythm.     Heart sounds: No murmur heard. Pulmonary:     Effort: Pulmonary  effort is normal. No respiratory distress.     Breath sounds: Normal breath sounds.  Abdominal:     Palpations: Abdomen is soft.     Tenderness: There is no guarding or rebound.     Comments: Very mild central abdominal discomfort  Musculoskeletal:        General: No tenderness.     Comments: Trace pitting edema to bilateral lower extremities  Skin:    General: Skin is warm and dry.  Neurological:     Mental Status: He is alert and oriented to person, place, and time.  Psychiatric:        Behavior: Behavior normal.     ED Results / Procedures / Treatments   Labs (all labs ordered are listed, but only abnormal results are displayed) Labs Reviewed  COMPREHENSIVE METABOLIC PANEL - Abnormal; Notable for the following components:      Result Value   CO2 19 (*)    Glucose, Bld 233 (*)    Calcium 8.8 (*)    Total Protein 5.9 (*)    All other components within normal limits  CBC WITH DIFFERENTIAL/PLATELET  PROTIME-INR  HEMOGLOBIN AND HEMATOCRIT, BLOOD  SAMPLE TO BLOOD BANK  TYPE AND SCREEN    EKG None  Radiology No results found.  Procedures Procedures    Medications Ordered in ED Medications - No data to display  ED Course/ Medical Decision Making/ A&P                           Medical Decision Making Amount and/or Complexity of Data Reviewed Labs: ordered.  Risk Decision regarding hospitalization.   Patient here for evaluation of bright red blood per rectum.  He had 4-5 episodes prior to ED arrival.  He did have 1 episode during his ED stay.  This appeared to be mixed bright red and dark red blood mixed with clots.  Patient did develop some mild tachycardia after this episode.  Hemoglobin is improved compared to recent value from January of this year.  Given description of symptoms with associated tachycardia recommend observation.  On record review he was admitted in December 2022 and had endoscopy performed at that time and was found to have significant  diverticulosis.  Medicine consulted for admission for observation.       Final Clinical Impression(s) / ED Diagnoses Final diagnoses:  Lower GI bleed    Rx / DC Orders ED Discharge Orders     None         Quintella Reichert, MD 03/21/22 [phone]

## 2022-03-21 NOTE — ED Triage Notes (Signed)
Pt here from home for rectal bleeding that started yesterday. Pt reports having 4 bloody Bms w/ some abd pain behind his navel and lightheadedness, denies syncopal episodes. Pt reports hx of this in 07/2021, was taken off plavix. Pt still takes a baby aspirin. Pt is very hard of hearing.

## 2022-03-21 NOTE — ED Provider Triage Note (Addendum)
Emergency Medicine Provider Triage Evaluation Note  John Gamble , a 88 y.o. male  was evaluated in triage.  Pt complains of hematochezia onset yesterday. Has had 4 bloody bowel movements mixed with stool. No syncope, chest pain, N/V, fevers. Hx of lower GI bleed in 07/2021 related to self-limiting diverticular bleed. On ASA but no anticoagulants.  Review of Systems  Positive: As above Negative: As above  Physical Exam  BP (!) 166/90   Pulse (!) 46   Temp 98.2 F (36.8 C) (Oral)   Resp 14   SpO2 99%  Gen:   Awake, no distress   Resp:  Normal effort  MSK:   Moves extremities without difficulty  Other:  Benign abdominal exam  Medical Decision Making  Medically screening exam initiated at 2:14 AM.  Appropriate orders placed.  John [facility]ble was informed that the remainder of the evaluation will be completed by another provider, this initial triage assessment does not replace that evaluation, and the importance of remaining in the ED until their evaluation is complete.  Lower GIB - labs initiated. Rectal exam deferred in triage. No focal abdominal TTP. Colonoscopy from 08/2021 reviewed - notable for diverticulosis throughout colon. Hx of transient bradycardia and hemodynamically stable in triage.   Antonietta Breach, PA-C 03/21/22 0225    Antonietta Breach, PA-C 03/21/22 0225

## 2022-03-22 DIAGNOSIS — R790 Abnormal level of blood mineral: Secondary | ICD-10-CM | POA: Diagnosis not present

## 2022-03-22 DIAGNOSIS — E876 Hypokalemia: Secondary | ICD-10-CM

## 2022-03-22 DIAGNOSIS — K921 Melena: Secondary | ICD-10-CM

## 2022-03-22 DIAGNOSIS — I2510 Atherosclerotic heart disease of native coronary artery without angina pectoris: Secondary | ICD-10-CM | POA: Diagnosis not present

## 2022-03-22 DIAGNOSIS — K922 Gastrointestinal hemorrhage, unspecified: Secondary | ICD-10-CM | POA: Diagnosis not present

## 2022-03-22 LAB — COMPREHENSIVE METABOLIC PANEL
ALT: 19 U/L (ref 0–44)
AST: 15 U/L (ref 15–41)
Albumin: 3.4 g/dL — ABNORMAL LOW (ref 3.5–5.0)
Alkaline Phosphatase: 67 U/L (ref 38–126)
Anion gap: 9 (ref 5–15)
BUN: 9 mg/dL (ref 8–23)
CO2: 23 mmol/L (ref 22–32)
Calcium: 8.8 mg/dL — ABNORMAL LOW (ref 8.9–10.3)
Chloride: 106 mmol/L (ref 98–111)
Creatinine, Ser: 0.87 mg/dL (ref 0.61–1.24)
GFR, Estimated: >60 mL/min (ref >60)
Glucose, Bld: 195 mg/dL — ABNORMAL HIGH (ref 70–99)
Potassium: 3.7 mmol/L (ref 3.5–5.1)
Sodium: 138 mmol/L (ref 135–145)
Total Bilirubin: 1 mg/dL (ref 0.3–1.2)
Total Protein: 5.7 g/dL — ABNORMAL LOW (ref 6.5–8.1)

## 2022-03-22 LAB — HEMOGLOBIN AND HEMATOCRIT, BLOOD
HCT: 38.5 % — ABNORMAL LOW (ref 39.0–52.0)
HCT: 39 % (ref 39.0–52.0)
Hemoglobin: 13.2 g/dL (ref 13.0–17.0)
Hemoglobin: 13.4 g/dL (ref 13.0–17.0)

## 2022-03-22 LAB — MAGNESIUM: Magnesium: 1.9 mg/dL (ref 1.7–2.4)

## 2022-03-22 MED ORDER — MAGNESIUM SULFATE IN D5W 1-5 GM/100ML-% IV SOLN
1.0000 g | Freq: Once | INTRAVENOUS | Status: AC
  Administered 2022-03-22: 1 g via INTRAVENOUS
  Filled 2022-03-22: qty 100

## 2022-03-22 MED ORDER — POTASSIUM CHLORIDE CRYS ER 20 MEQ PO TBCR
40.0000 meq | EXTENDED_RELEASE_TABLET | Freq: Once | ORAL | Status: AC
  Administered 2022-03-22: 40 meq via ORAL
  Filled 2022-03-22: qty 2

## 2022-03-22 NOTE — Evaluation (Signed)
Physical Therapy Evaluation Patient Details Name: John Gamble MRN: [id number] DOB: 1931-07-20 Today's Date: 03/22/2022  History of Present Illness  Pt is an 88 y/o male who presented with lower GI bleed. Pt admitted for monitoring of hgb and bleeding during admission. PMH: CHF, HTN, CAD  Clinical Impression   Pt admitted with above diagnosis. Lives at home with wife, in a single -level home with a few steps to enter; Prior to admission, pt was able to manage independently, driving; Presents to PT with unsteady gait, incr fall risk; He is quite hesitant to use an assistive device; Able to walk hallways with Supervision/min assist; Highly recommend Outpt PT for gait and balance dysfunction; Pt currently with functional limitations due to the deficits listed below (see PT Problem List). Pt will benefit from skilled PT to increase their independence and safety with mobility to allow discharge to the venue listed below.          Recommendations for follow up therapy are one component of a multi-disciplinary discharge planning process, led by the attending physician.  Recommendations may be updated based on patient status, additional functional criteria and insurance authorization.  Follow Up Recommendations Outpatient PT (for gait and balance dysfunction)      Assistance Recommended at Discharge PRN  Patient can return home with the following       Equipment Recommendations None recommended by PT (has RW and cane)  Recommendations for Other Services       Functional Status Assessment Patient has had a recent decline in their functional status and demonstrates the ability to make significant improvements in function in a reasonable and predictable amount of time.     Precautions / Restrictions Precautions Precautions: Fall Restrictions Weight Bearing Restrictions: No      Mobility  Bed Mobility Overal bed mobility: Modified Independent             General bed mobility  comments: slow, but no assist needed    Transfers Overall transfer level: Needs assistance Equipment used: None Transfers: Sit to/from Stand Sit to Stand: Min guard           General transfer comment: Tends to need up to 3 "tries" before successfully standing from the bed; smoother rise from commode when he has a grab bar to pull up on; TElls me he is looking to have a grab bar installed    Ambulation/Gait Ambulation/Gait assistance: Min guard Gait Distance (Feet): 50 Feet Assistive device: None Gait Pattern/deviations: Decreased step length - right, Decreased step length - left, Decreased stance time - right, Decreased stance time - left, Decreased weight shift to left, Knee flexed in stance - right       General Gait Details: Short steps with decr step height, and decr ability to fully accept weight onto stance leg (R and L) to allow for freer advancement  Stairs            Wheelchair Mobility    Modified Rankin (Stroke Patients Only)       Balance Overall balance assessment: Needs assistance             Standing balance comment: Tends to reach out for UE support at initial stand and initially with walking as well                             Pertinent Vitals/Pain Pain Assessment Pain Assessment: No/denies pain    Home Living Family/patient expects to  be discharged to:: Private residence Living Arrangements: Spouse/significant other Available Help at Discharge: Family;Available 24 hours/day Type of Home: House Home Access: Stairs to enter Entrance Stairs-Rails: Right Entrance Stairs-Number of Steps: 1   Home Layout: One level Home Equipment: Conservation officer, nature (2 wheels);Cane - single point      Prior Function Prior Level of Function : Independent/Modified Independent;Driving             Mobility Comments: no AD though family have been suggesting pt to use cane d/t baseline balance deficits ADLs Comments: indep, drives, walks at the  gym     Hand Dominance   Dominant Hand: Right    Extremity/Trunk Assessment   Upper Extremity Assessment Upper Extremity Assessment: Defer to OT evaluation    Lower Extremity Assessment Lower Extremity Assessment: Generalized weakness (decr coordination in single limb stance R and L, leading to decr step length bilaterally)    Cervical / Trunk Assessment Cervical / Trunk Assessment: Normal  Communication   Communication: HOH  Cognition Arousal/Alertness: Awake/alert Behavior During Therapy: WFL for tasks assessed/performed, Impulsive Overall Cognitive Status: No family/caregiver present to determine baseline cognitive functioning                                 General Comments: decreased awareness of deficits and impaired safety awareness. declines need for DME though balance deficits evident        General Comments General comments (skin integrity, edema, etc.): hesitant to use assitive device; NAD on room air; atempted a BM in teh bathroom -- was mostly gas, but noted a small amount of bright red blood in commode and on toilet paper    Exercises     Assessment/Plan    PT Assessment Patient needs continued PT services  PT Problem List Decreased strength;Decreased activity tolerance;Decreased balance;Decreased mobility;Decreased knowledge of use of DME;Decreased safety awareness       PT Treatment Interventions DME instruction;Gait training;Stair training;Functional mobility training;Therapeutic activities;Therapeutic exercise;Balance training;Neuromuscular re-education;Cognitive remediation;Patient/family education    PT Goals (Current goals can be found in the Care Plan section)  Acute Rehab PT Goals Patient Stated Goal: HOme soon PT Goal Formulation: With patient Time For Goal Achievement: 03/29/22 Potential to Achieve Goals: Good    Frequency Min 3X/week     Co-evaluation               AM-PAC PT "6 Clicks" Mobility  Outcome Measure  Help needed turning from your back to your side while in a flat bed without using bedrails?: None Help needed moving from lying on your back to sitting on the side of a flat bed without using bedrails?: None Help needed moving to and from a bed to a chair (including a wheelchair)?: None Help needed standing up from a chair using your arms (e.g., wheelchair or bedside chair)?: A Little Help needed to walk in hospital room?: A Little Help needed climbing 3-5 steps with a railing? : A Little 6 Click Score: 21    End of Session Equipment Utilized During Treatment: Gait belt Activity Tolerance: Patient tolerated treatment well Patient left: Other (comment) (in session with OT) Nurse Communication: Mobility status PT Visit Diagnosis: Unsteadiness on feet (R26.81);Other abnormalities of gait and mobility (R26.89)    Time: 6270-3500 PT Time Calculation (min) (ACUTE ONLY): 25 min   Charges:   PT Evaluation $PT Eval Low Complexity: 1 Low PT Treatments $Gait Training: 8-22 mins  Roney Marion, PT  Acute Rehabilitation Services Office [phone]   Colletta [place] 03/22/2022, 11:57 AM

## 2022-03-22 NOTE — Assessment & Plan Note (Addendum)
Noted on exam.  Patient is asymptomatic. - Holding beta-blocker, cont BB outpatient at discharge  - Can restart home beta-blocker if blood pressure tolerates 50 mg losartan.

## 2022-03-22 NOTE — Plan of Care (Signed)
  Problem: Clinical Measurements: Goal: Ability to maintain clinical measurements within normal limits will improve Outcome: Progressing   

## 2022-03-22 NOTE — Evaluation (Addendum)
Occupational Therapy Evaluation/Discharge Patient Details Name: John Gamble MRN: [id number] DOB: 1931-05-14 Today's Date: 03/22/2022   History of Present Illness Pt is an 88 y/o male who presented with lower GI bleed. Pt admitted for monitoring of hgb and bleeding during admission. PMH: CHF, HTN, CAD   Clinical Impression   PTA, pt lives with spouse and reports Independence in all ADLs, IADLs and mobility without. Pt denies any recent falls but does endorse that family often suggests that pt use a cane for mobility d/t balance deficits. Pt presents now fairly close to baseline, Setup-min guard for ADLs and mobility using no AD vs RW. With reluctance, pt agreeable to try RW for hallway mobility with improved mobility noted. However, pt declines need for consistent AD use at home. As pt is close to baseline and resistant to new DME/safety education, no further skilled OT services needed at this time. Recommend continued mobility with staff during admission and family assist prn at home.       Recommendations for follow up therapy are one component of a multi-disciplinary discharge planning process, led by the attending physician.  Recommendations may be updated based on patient status, additional functional criteria and insurance authorization.   Follow Up Recommendations  No OT follow up    Assistance Recommended at Discharge PRN  Patient can return home with the following      Functional Status Assessment  Patient has had a recent decline in their functional status and demonstrates the ability to make significant improvements in function in a reasonable and predictable amount of time.  Equipment Recommendations  None recommended by OT    Recommendations for Other Services       Precautions / Restrictions Precautions Precautions: Fall Restrictions Weight Bearing Restrictions: No      Mobility Bed Mobility               General bed mobility comments: received in bathroom  with PT    Transfers Overall transfer level: Needs assistance Equipment used: None Transfers: Sit to/from Stand Sit to Stand: Supervision                  Balance Overall balance assessment: Mild deficits observed, not formally tested                                         ADL either performed or assessed with clinical judgement   ADL Overall ADL's : Needs assistance/impaired Eating/Feeding: Independent;Sitting   Grooming: Supervision/safety;Standing;Oral care;Wash/dry face;Brushing hair Grooming Details (indicate cue type and reason): no overt LOB but general unsteadiness noted standing at sink Upper Body Bathing: Set up;Standing   Lower Body Bathing: Sitting/lateral leans;Sit to/from stand;Min guard   Upper Body Dressing : Set up   Lower Body Dressing: Min guard;Sit to/from stand;Sitting/lateral leans   Toilet Transfer: Min guard;Ambulation   Toileting- Clothing Manipulation and Hygiene: Min guard;Sitting/lateral lean;Sit to/from stand       Functional mobility during ADLs: Min guard General ADL Comments: Noted unsteadiness without AD, improved with RW trial for hallway mobility though pt declines to consistently use DME for mobility     Vision Baseline Vision/History: 1 Wears glasses Ability to See in Adequate Light: 0 Adequate Patient Visual Report: No change from baseline Vision Assessment?: No apparent visual deficits     Perception     Praxis      Pertinent Vitals/Pain Pain Assessment Pain  Assessment: No/denies pain     Hand Dominance Right   Extremity/Trunk Assessment Upper Extremity Assessment Upper Extremity Assessment: Overall WFL for tasks assessed   Lower Extremity Assessment Lower Extremity Assessment: Defer to PT evaluation   Cervical / Trunk Assessment Cervical / Trunk Assessment: Normal   Communication Communication Communication: HOH   Cognition Arousal/Alertness: Awake/alert Behavior During Therapy: WFL  for tasks assessed/performed, Impulsive Overall Cognitive Status: No family/caregiver present to determine baseline cognitive functioning                                 General Comments: decreased awareness of deficits and impaired safety awareness. declines need for DME though balance deficits evident     General Comments       Exercises     Shoulder Instructions      Home Living Family/patient expects to be discharged to:: Private residence Living Arrangements: Spouse/significant other Available Help at Discharge: Family;Available 24 hours/day Type of Home: House Home Access: Stairs to enter [employer] of Steps: 1 Entrance Stairs-Rails: Right Home Layout: One level     Bathroom Shower/Tub: Teacher, early years/pre: Handicapped height     Home Equipment: Conservation officer, nature (2 wheels);Cane - single point          Prior Functioning/Environment Prior Level of Function : Independent/Modified Independent;Driving             Mobility Comments: no AD though family have been suggesting pt to use cane d/t baseline balance deficits ADLs Comments: indep, drives, walks at the gym        OT Problem List: Impaired balance (sitting and/or standing);Decreased safety awareness;Decreased knowledge of use of DME or AE      OT Treatment/Interventions:      OT Goals(Current goals can be found in the care plan section) Acute Rehab OT Goals Patient Stated Goal: home soon, avoid using DME for mobility OT Goal Formulation: With patient Time For Goal Achievement: 04/05/22 Potential to Achieve Goals: Good  OT Frequency:      Co-evaluation              AM-PAC OT "6 Clicks" Daily Activity     Outcome Measure Help from another person eating meals?: None Help from another person taking care of personal grooming?: A Little Help from another person toileting, which includes using toliet, bedpan, or urinal?: A Little Help from another person  bathing (including washing, rinsing, drying)?: A Little Help from another person to put on and taking off regular upper body clothing?: A Little Help from another person to put on and taking off regular lower body clothing?: A Little 6 Click Score: 19   End of Session Equipment Utilized During Treatment: Gait belt;Rolling walker (2 wheels)  Activity Tolerance: Patient tolerated treatment well Patient left: in chair;with call bell/phone within reach;with chair alarm set  OT Visit Diagnosis: Unsteadiness on feet (R26.81)                Time: 4431-5400 OT Time Calculation (min): 24 min Charges:  OT General Charges $OT Visit: 1 Visit OT Evaluation $OT Eval Moderate Complexity: 1 Mod  Malachy Chamber, OTR/L Acute Rehab Services Office: [phone]   Layla Maw 03/22/2022, 10:13 AM

## 2022-03-22 NOTE — Assessment & Plan Note (Signed)
Mag 1.9 - give mag prior to d/c

## 2022-03-22 NOTE — Progress Notes (Signed)
Daily Progress Note Intern Pager: [phone]  Patient name: John Gamble Medical record number: [id number] Date of birth: 06/27/1931 Age: 88 y.o. Gender: male  Primary Care Provider: Algis Greenhouse, MD Consultants: GI Code Status: Full code  Pt Overview and Major Events to Date:  -Admitted to [facility] 8/21  Assessment and Plan:  John Gamble is a 88 y.o. male presenting with bright red blood per rectum.  Has medical history includes prediabetes, hypertension, hyperlipidemia, CAD with history of MI, prior history of GI bleed with history of diverticular disease.  * Lower GI bleed One episode of small amount red blood during bowel movement this morning.  Denies any large volume stools.  Hgb stable. Transfusion threshold of 8 given history of CAD. Recent EGD and colonoscopy performed Jan 2023 which was notable for gastritis and diverticular disease. -consulted GI, appreciate recommendations  -hold aspirin  -d/c H&H q6h -plan to transfuse if Hgb <8, with CAD history -PT/OT eval & treat -GI will not be scoping this admission due to recent scope. -CTA if bleeding resumes  Low serum potassium level K 3.7 - give 40 mEq of K prior to d/c   Low blood magnesium Mag 1.9 - give mag prior to d/c   PVC's (premature ventricular contractions) Noted on exam.  Patient is asymptomatic. - Holding beta-blocker, cont BB outpatient at discharge  - Can restart home beta-blocker if blood pressure tolerates 50 mg losartan.  Acute on chronic combined systolic and diastolic CHF (congestive heart failure) ([place]) Most recent echo in June 2022 demonstrated EF 45-50% with mildly decreased left ventricular function and mild aortic valve regurgitation. Home meds include spironolactone. Mild trace pitting edema noted along lower extremities bilaterally on exam.  -hold spironolactone pending formal med rec and in the setting of volume loss -monitor I/Os -consider repeat echo is patient is requiring fluids    Hypertension Several readings of >160/90, home meds include losartan 50 mg. -Restart losartan -monitor BP   Coronary artery disease With history of MI, was on plavix for a long period of time which was recently discontinued earlier in the month by his cardiologist. -hold aspirin -transfusion threshold 8    FEN/GI: Clear liquid diet PPx: SCDs Dispo:Home . Barriers include none.   Subjective:  Patient resting comfortably.  No acute events overnight.  No new complaints or concerns this morning.  He reports having 1 small bowel movement with "a teaspoon" of red blood.  Denies having any large volume stools.  Objective: Temp:  [97.5 F (36.4 C)-97.9 F (36.6 C)] 97.7 F (36.5 C) (08/22 0849) Pulse Rate:  [42-133] 49 (08/22 0849) Resp:  [15-30] 17 (08/22 0849) BP: (136-174)/(52-113) 174/89 (08/22 0849) SpO2:  [90 %-100 %] 94 % (08/22 0849) Weight:  [80.8 kg] 80.8 kg (08/22 0002) Physical Exam: General: Frail-appearing, no acute distress Cardiovascular: Regular rate, regularly irregular rhythm, regular dropped beats  Respiratory: Clear, no consolidations, no crackles.  No increased work of breathing Abdomen: Soft, nontender Extremities: No peripheral edema  Laboratory: Most recent CBC Lab Results  Component Value Date   WBC 10.2 03/21/2022   HGB 13.2 03/22/2022   HCT 38.5 (L) 03/22/2022   MCV 89.4 03/21/2022   PLT 261 03/21/2022   Most recent BMP    Latest Ref Rng & Units 03/22/2022    5:36 AM  BMP  Glucose 70 - 99 mg/dL 195   BUN 8 - 23 mg/dL 9   Creatinine 0.61 - 1.24 mg/dL 0.87   Sodium  135 - 145 mmol/L 138   Potassium 3.5 - 5.1 mmol/L 3.7   Chloride 98 - 111 mmol/L 106   CO2 22 - 32 mmol/L 23   Calcium 8.9 - 10.3 mg/dL 8.8    Darci Current, DO 03/22/2022, 11:30 AM  PGY-1, [place] Intern pager: [phone], text pages welcome Secure chat group [place]

## 2022-03-22 NOTE — Hospital Course (Addendum)
John Gamble is a 88 y.o. male who was admitted for BRBPR. PMHx is significant for prediabetes, hypertension, hyperlipidemia, CAD with hx of MI, prior hx of GI bleed with hx of diverticular disease on last colonoscopy. His hospital course is outlined below:  Diverticular Bleed  BRBPR  Patient had multiple large volume bloody stools at home consistent with diverticular bleed.  GI was consulted but did not feel that repeat EGD or colonoscopy was necessary since he just had these in January.  He remained hemodynamically stable and his Hgb remained stable and at his baseline during admission (12-14). Patient had no bloody bowel movements for more than 24 hours.  Prior to discharge he had one episode of dark black stool and then another episode of small volume blood passed with gas.  Patient reassured this can be normal after a diverticular bleed.  Hemoglobin has remained stable, and he was discharged home with GI follow up outpatient.   CAD  s/p stent  ASA held for 5 days after discharge.  Hypertension His home blood pressure medications resumed at discharge.

## 2022-03-22 NOTE — Progress Notes (Addendum)
Progress Note   Subjective  Day #1 Chief Complaint: Lower GI bleed  Today, patient is seen with his wife by his side.  He tells me that he had another bowel movement late yesterday evening and again this morning with just a small amount of blood, this seems to be decreasing in amount.  He continues with some periumbilical discomfort which is chronic for him over the past few months.  He is asking if he can have anything more to eat this morning as he has been on a clear liquid diet.  Per his wife they would feel better about him spending the night as long as he is still bleeding.   Objective   Vital signs in last 24 hours: Temp:  [97.5 F (36.4 C)-97.9 F (36.6 C)] 97.7 F (36.5 C) (08/22 0849) Pulse Rate:  [42-133] 49 (08/22 0849) Resp:  [15-30] 17 (08/22 0849) BP: (136-174)/(52-113) 174/89 (08/22 0849) SpO2:  [90 %-100 %] 94 % (08/22 0849) Weight:  [80.8 kg] 80.8 kg (08/22 0002) Last BM Date : 03/21/22 General:    Elderly white male in NAD Heart:  Regular rate and rhythm; no murmurs Lungs: Respirations even and unlabored, lungs CTA bilaterally Abdomen:  Soft, mild periumbilical TTP and nondistended. Normal bowel sounds. Psych:  Cooperative. Normal mood and affect.  Intake/Output from previous day: 08/21 0701 - 08/22 0700 In: 250 [IV Piggyback:250] Out: 500 [Urine:500] Intake/Output this shift: Total I/O In: 240 [P.O.:240] Out: 750 [Urine:750]  Lab Results: Recent Labs    03/21/22 0225 03/21/22 0640 03/21/22 1240 03/22/22 0537  WBC 10.2  --   --   --   HGB 14.2 13.7 12.2* 13.2  HCT 42.3 41.5 36.3* 38.5*  PLT 261  --   --   --    BMET Recent Labs    03/21/22 0225 03/22/22 0536  NA 135 138  K 4.5 3.7  CL 106 106  CO2 19* 23  GLUCOSE 233* 195*  BUN 13 9  CREATININE 0.92 0.87  CALCIUM 8.8* 8.8*   LFT Recent Labs    03/22/22 0536  PROT 5.7*  ALBUMIN 3.4*  AST 15  ALT 19  ALKPHOS 67  BILITOT 1.0   PT/INR Recent Labs    03/21/22 0225   LABPROT 14.1  INR 1.1     Assessment / Plan:   Assessment: 1.  Lower GI bleed: 4-5 episodes of hematochezia yesterday morning at home, another small amount yesterday evening and then again this morning, hemoglobin very stable from 12.2--> 13.2 overnight, recent EGD and colonoscopy in January; again likely diverticular 2.  Acute on chronic combined systolic and diastolic CHF: Recent EF in June 45-50% 3.  CAD: Status post drug-eluting stents, previously on Plavix and Aspirin, Plavix stopped earlier in August, continues on baby aspirin which has been held this hospitalization  Plan: 1.  Discussed with patient and his wife that we will keep him 1 more night to observe him.  If his hemoglobin stays stable then he can likely be discharged tomorrow. 2.  Discussed that when patient goes home he should be on a low fiber diet for 2 days or so, for now we will keep him on clear liquids. 3.  Please continue to monitor hemoglobin with transfusion as needed less than 8  Thank you for your kind consultation, we will continue to follow   LOS: 1 day   Levin Erp  03/22/2022, 11:45 AM    Attending Physician Note   I have  taken an interval history, reviewed the chart and examined the patient. I performed a substantive portion of this encounter, including complete performance of at least one of the key components, in conjunction with the APP. I agree with the APP's note, impression and recommendations with my edits. My additional impressions and recommendations are as follows.   Mild diverticular bleeding, resolving. Observe until tomorrow and if bleeding resolves he can be discharged. Would hold ASA for 1 week.   Lucio Edward, MD [facility] See AM[facility], [facility], for our on call provider

## 2022-03-22 NOTE — Assessment & Plan Note (Signed)
K 3.7 - give 40 mEq of K prior to d/c

## 2022-03-23 DIAGNOSIS — I5043 Acute on chronic combined systolic (congestive) and diastolic (congestive) heart failure: Secondary | ICD-10-CM | POA: Diagnosis not present

## 2022-03-23 DIAGNOSIS — K922 Gastrointestinal hemorrhage, unspecified: Secondary | ICD-10-CM | POA: Diagnosis not present

## 2022-03-23 DIAGNOSIS — I493 Ventricular premature depolarization: Secondary | ICD-10-CM

## 2022-03-23 DIAGNOSIS — I2510 Atherosclerotic heart disease of native coronary artery without angina pectoris: Secondary | ICD-10-CM | POA: Diagnosis not present

## 2022-03-23 DIAGNOSIS — R790 Abnormal level of blood mineral: Secondary | ICD-10-CM | POA: Diagnosis not present

## 2022-03-23 LAB — HEMOGLOBIN AND HEMATOCRIT, BLOOD
HCT: 39.8 % (ref 39.0–52.0)
Hemoglobin: 13.9 g/dL (ref 13.0–17.0)

## 2022-03-23 MED ORDER — ASPIRIN EC 81 MG PO TBEC: 81.0000 mg | DELAYED_RELEASE_TABLET | Freq: Every day | ORAL | 11 refills | Status: DC

## 2022-03-23 NOTE — TOC Transition Note (Addendum)
Transition of Care [facility]) - CM/SW Discharge Note   Patient Details  Name: John Gamble MRN: [id number] Date of Birth: 1930/08/26  Transition of Care [facility]) CM/SW Contact:  Carles Collet, RN Phone Number: 03/23/2022, 1:52 PM   Clinical Narrative:    Damaris Schooner w the patient and he declined referral to be made for OP PT.  Patient stated he would [facility], [facility].  He states that his cardiologist has encouraging him to OP PT.  Encouraged and offered to make referral for him and he declined several times.    No other TOC needs identified for DC, referral not placed by TOC at patient's request.    Final next level of care: Home/Self Care Barriers to Discharge: No Barriers Identified   Patient Goals and CMS Choice Patient states their goals for this hospitalization and ongoing recovery are:: to go home      Discharge Placement                       Discharge Plan and Services                DME Arranged: N/A                    Social Determinants of Health (SDOH) Interventions     Readmission Risk Interventions     No data to display

## 2022-03-23 NOTE — Progress Notes (Signed)
    Progress Note   Assessment    Mild diverticular bleeding, resolved. Hgb yesterday afternoon 13.4 Mild constipation   Recommendations   OK for discharge from GI standpoint.  Hold ASA for a 5 more days No strenuous activity for 1 week Advance to heart healthy diet Miralax daily, tritrated dosing Outpatient GI follow up prn with Dr. Earlean Shawl, [facility]   Chief Complaint   No further bleeding. No bowel movements. Wants to go home  Vital signs in last 24 hours: Temp:  [97.4 F (36.3 C)-97.7 F (36.5 C)] 97.4 F (36.3 C) (08/23 0742) Pulse Rate:  [41-108] 52 (08/23 0742) Resp:  [17-18] 17 (08/23 0742) BP: (127-174)/(53-89) 127/61 (08/23 0742) SpO2:  [94 %-98 %] 94 % (08/23 0742) Last BM Date : 03/22/22  General: Alert, well-developed, elderly in NAD Heart:  Regular rate and rhythm; no murmurs Chest: Clear to ascultation bilaterally Abdomen:  Soft, nontender and nondistended. Normal bowel sounds, without guarding, and without rebound.   Extremities:  Without edema. Neurologic:  Alert and  oriented x4; grossly normal neurologically. Psych:  Alert and cooperative. Normal mood and affect.  Intake/Output from previous day: 08/22 0701 - 08/23 0700 In: 1080 [P.O.:1080] Out: 1550 [Urine:1550] Intake/Output this shift: No intake/output data recorded.  Lab Results: Recent Labs    03/21/22 0225 03/21/22 0640 03/21/22 1240 03/22/22 0537 03/22/22 1631  WBC 10.2  --   --   --   --   HGB 14.2   < > 12.2* 13.2 13.4  HCT 42.3   < > 36.3* 38.5* 39.0  PLT 261  --   --   --   --    < > = values in this interval not displayed.   BMET Recent Labs    03/21/22 0225 03/22/22 0536  NA 135 138  K 4.5 3.7  CL 106 106  CO2 19* 23  GLUCOSE 233* 195*  BUN 13 9  CREATININE 0.92 0.87  CALCIUM 8.8* 8.8*   LFT Recent Labs    03/22/22 0536  PROT 5.7*  ALBUMIN 3.4*  AST 15  ALT 19  ALKPHOS 67  BILITOT 1.0   PT/INR Recent Labs    03/21/22 0225  LABPROT 14.1  INR  1.1     LOS: 2 days   Chrisanna Mishra T. Fuller Plan, MD 03/23/2022, 8:43 AM See [facility], to contact our on call provider

## 2022-03-23 NOTE — Progress Notes (Signed)
Discharge instructions provided to patient, patient and family verbalizes understanding. Medications reviewed and follow up appointments. Referral to outpatient ordered by MD for PT. Patient discharged

## 2022-03-23 NOTE — Discharge Summary (Addendum)
[facility] Discharge Summary  Patient name: John Gamble Medical record number: [id number] Date of birth: 23-Nov-1930 Age: 88 y.o. Gender: male Date of Admission: 03/21/2022  Date of Discharge: 03/23/22  Admitting Physician: Zenia Resides, MD  Primary Care Provider: Algis Greenhouse, MD Consultants: GI  Indication for Hospitalization: GI Bleed   Brief Hospital Course:  John Gamble is a 88 y.o. male who was admitted for BRBPR. PMHx is significant for prediabetes, hypertension, hyperlipidemia, CAD with hx of MI, prior hx of GI bleed with hx of diverticular disease on last colonoscopy. His hospital course is outlined below:  Diverticular Bleed  BRBPR  Patient had multiple large volume bloody stools at home consistent with diverticular bleed.  GI was consulted but did not feel that repeat EGD or colonoscopy was necessary since he just had these in January.  He remained hemodynamically stable and his Hgb remained stable and at his baseline during admission (12-14). Patient had no bloody bowel movements for more than 24 hours.  Prior to discharge he had one episode of dark black stool and then another episode of small volume blood passed with gas.  Patient reassured this can be normal after a diverticular bleed.  Hemoglobin has remained stable, and he was discharged home with GI follow up outpatient.   CAD  s/p stent  ASA held for 5 days after discharge.  Hypertension His home blood pressure medications resumed at discharge.   Discharge Diagnoses/Problem List:  Principal Problem for Admission: Diverticular bleed Other Problems addressed during stay:  Blood pressure, resumed all home medications Heart rhythm, resumed on home medications   Disposition: Home  Discharge Condition: Stable  Discharge Exam:  Vitals:   03/23/22 0612 03/23/22 0742  BP: (!) 148/56 127/61  Pulse: (!) 41 (!) 52  Resp: 18 17  Temp: 97.7 F (36.5 C) (!) 97.4 F (36.3 C)   SpO2: 97% 94%   Has sinus arrhythmia and HR fluctuated throughout admission. Asymptomatic from bradycardia.  Patient is frail, in no acute distress Cardio: Regular rate, irregular to regular rhythm with PVCs appreciated on exam.  Consistent with outpatient cardiology notes. Pulm: Clear, no wheezing, no crackles.  No increased work of breathing. GI: Abdomen soft, nontender, nondistended Extremities: No peripheral edema  Issues for Follow Up:  Follow-up outpatient GI for diverticular bleed. 2.  Follow-up on home MiraLAX use patient believes his constipation causes bleeding.  Significant Procedures: None performed  Significant Labs and Imaging:  Recent Labs  Lab 03/22/22 0537 03/22/22 1631 03/23/22 0817  HGB 13.2 13.4 13.9  HCT 38.5* 39.0 39.8   Recent Labs  Lab 03/22/22 0536  NA 138  K 3.7  CL 106  CO2 23  GLUCOSE 195*  BUN 9  CREATININE 0.87  CALCIUM 8.8*  MG 1.9  ALKPHOS 67  AST 15  ALT 19  ALBUMIN 3.4*    Results/Tests Pending at Time of Discharge: none  Discharge Medications:  Allergies as of 03/23/2022       Reactions   Atorvastatin Other (See Comments)   myalgias   Oysters [shellfish Allergy] Nausea Only   Sulfa Antibiotics Nausea And Vomiting        Medication List     TAKE these medications    acyclovir 800 MG tablet Commonly known as: ZOVIRAX Take 800 mg by mouth daily.   aspirin EC 81 MG tablet Take 1 tablet (81 mg total) by mouth daily. Start taking on: March 28, 2022 What changed: These  instructions start on March 28, 2022. If you are unsure what to do until then, ask your doctor or other care provider.   dorzolamide 2 % ophthalmic solution Commonly known as: TRUSOPT Place 1 drop into both eyes 2 (two) times daily.   finasteride 5 MG tablet Commonly known as: PROSCAR Take 1 tablet (5 mg total) by mouth daily.   Fish Oil 1200 MG Caps Take 1,200 mg by mouth daily.   Glucosamine-Chondroitin 250-200 MG Tabs Take 2 tablets by  mouth 2 (two) times daily.   losartan 50 MG tablet Commonly known as: COZAAR Take 1 tablet (50 mg total) by mouth daily.   metoprolol succinate 25 MG 24 hr tablet Commonly known as: TOPROL-XL Take 1 tablet (25 mg total) by mouth daily.   multivitamin with minerals Tabs tablet Take 1 tablet by mouth daily. Centrum Silver   OneTouch Delica Plus [id number] Misc SMARTSIG:1 Unit(s) Topical Daily   OneTouch Ultra test strip Generic drug: glucose blood USE 1 STRIP DAILY.   OneTouch Ultra test strip Generic drug: glucose blood daily.   pantoprazole 40 MG tablet Commonly known as: PROTONIX Take 1 tablet (40 mg total) by mouth daily at 6 (six) AM.   polyethylene glycol 17 g packet Commonly known as: MIRALAX / GLYCOLAX Take 17 g by mouth daily as needed for mild constipation. Mix with 8 oz liquid and drink   prednisoLONE acetate 1 % ophthalmic suspension Commonly known as: PRED FORTE Place 1 drop into the left eye at bedtime.   rosuvastatin 20 MG tablet Commonly known as: CRESTOR Take 20 mg by mouth daily.   silodosin 8 MG Caps capsule Commonly known as: RAPAFLO Take 8 mg by mouth daily.   spironolactone 25 MG tablet Commonly known as: ALDACTONE Take 0.5 tablets (12.5 mg total) by mouth daily.   Zinc 50 MG Caps Take 50 mg by mouth daily with supper.        Discharge Instructions: Please refer to Patient Instructions section of EMR for full details.  Patient was counseled important signs and symptoms that should prompt return to medical care, changes in medications, dietary instructions, activity restrictions, and follow up appointments.   Follow-Up Appointments:   To be scheduled with GI  Darci Current, DO 03/23/2022, 12:28 PM PGY-1, [place] Upper-Level Resident Addendum   I have independently interviewed and examined the patient. I have discussed the above with the original author and agree with their documentation. My edits for  correction/addition/clarification are included where appropriate. Please see also any attending notes.   Sharion Settler, DO PGY-3, [place] [facility] 03/23/2022 12:43 PM  [place] Service pager: [phone] (text pages welcome through [place])

## 2022-03-23 NOTE — Progress Notes (Signed)
Daily Progress Note Intern Pager: [phone]  Patient name: John Gamble Medical record number: [id number] Date of birth: March 24, 1931 Age: 88 y.o. Gender: male  Primary Care Provider: Algis Greenhouse, MD Consultants: GI Code Status: Full code  Pt Overview and Major Events to Date:  -Admitted to [facility]MT[facility] 8/21  Assessment and Plan:  John Gamble is a 88 y.o. male presenting with bright red blood per rectum.  Has medical history includes prediabetes, hypertension, hyperlipidemia, CAD with history of MI, prior history of GI bleed with history of diverticular disease.  * Lower GI bleed One episode of small amount red blood during bowel movement this morning.  Denies any large volume stools.  Hgb stable. Transfusion threshold of 8 given history of CAD. Recent EGD and colonoscopy performed Jan 2023 which was notable for gastritis and diverticular disease. -consulted GI, appreciate recommendations  -plan to transfuse if Hgb <8, with CAD history -Patient stable for discharge home  Low serum potassium level K 3.7 - give 40 mEq of K prior to d/c   Low blood magnesium Mag 1.9 - give mag prior to d/c   PVC's (premature ventricular contractions) Noted on exam.  Patient is asymptomatic.  Recent cardiologist office visit 8/2, cardiologist recommended continuing beta-blocker and thought his rate was falsely low due to the PVCs. - Restart home beta-blocker  Acute on chronic combined systolic and diastolic CHF (congestive heart failure) ([place]) Most recent echo in June 2022 demonstrated EF 45-50% with mildly decreased left ventricular function and mild aortic valve regurgitation. Home meds include spironolactone. Mild trace pitting edema noted along lower extremities bilaterally on exam.  -monitor I/Os -consider repeat echo outpatient  Hypertension Several readings of >160/90, home meds include losartan 50 mg. -Restart losartan -monitor BP   Coronary artery disease With history of MI, was on  plavix for a long period of time which was recently discontinued earlier in the month by his cardiologist. -Continue aspirin outpatient -transfusion threshold 8    FEN/GI: Clear liquid diet PPx: SCDs Dispo:Home today. Barriers include none.   Subjective:  Patient is doing well and resting comfortably.  He has no new complaints.  He had no acute events overnight.  He is ready to go home.  He denies any new bloody bowel movements.  Objective: Temp:  [97.4 F (36.3 C)-97.7 F (36.5 C)] 97.4 F (36.3 C) (08/23 0742) Pulse Rate:  [41-108] 52 (08/23 0742) Resp:  [17-18] 17 (08/23 0742) BP: (127-174)/(53-89) 127/61 (08/23 0742) SpO2:  [94 %-98 %] 94 % (08/23 0742) Physical Exam: General: Well-appearing, no acute distress Cardiovascular: Irregularly regular rhythm.  Regular rate.  No murmurs appreciated on exam Respiratory: No increased work of breathing, no wheezes, no crackles, no consolidations heard on exam Abdomen: Soft nontender Extremities: No peripheral edema  Laboratory: Most recent CBC Lab Results  Component Value Date   WBC 10.2 03/21/2022   HGB 13.4 03/22/2022   HCT 39.0 03/22/2022   MCV 89.4 03/21/2022   PLT 261 03/21/2022   Most recent BMP    Latest Ref Rng & Units 03/22/2022    5:36 AM  BMP  Glucose 70 - 99 mg/dL 195   BUN 8 - 23 mg/dL 9   Creatinine 0.61 - 1.24 mg/dL 0.87   Sodium 135 - 145 mmol/L 138   Potassium 3.5 - 5.1 mmol/L 3.7   Chloride 98 - 111 mmol/L 106   CO2 22 - 32 mmol/L 23   Calcium 8.9 - 10.3 mg/dL 8.8  Darci Current, DO 03/23/2022, 8:43 AM  PGY-1, [place] Intern pager: [phone], text pages welcome Secure chat group [place]

## 2022-03-23 NOTE — Progress Notes (Signed)
Physical Therapy Treatment Patient Details Name: John Gamble MRN: [id number] DOB: 04/25/1931 Today's Date: 03/23/2022   History of Present Illness Pt is an 88 y/o male who presented with lower GI bleed. Pt admitted for monitoring of hgb and bleeding during admission. PMH: CHF, HTN, CAD    PT Comments    Patient progressing towards physical therapy goals. Able to progress ambulation distance with use of HHAx1 to mimic cane use and min guard for safety. Patient able to negotiate stairs to safely access home environment. Educated patient and family on use of cane or RW for stability and safety, patient hesitant to agree but family in agreement. Recommend OPPT to address balance and strength deficits.     Recommendations for follow up therapy are one component of a multi-disciplinary discharge planning process, led by the attending physician.  Recommendations may be updated based on patient status, additional functional criteria and insurance authorization.  Follow Up Recommendations  Outpatient PT     Assistance Recommended at Discharge PRN  Patient can return home with the following A little help with walking and/or transfers   Equipment Recommendations  None recommended by PT    Recommendations for Other Services       Precautions / Restrictions Precautions Precautions: Fall Restrictions Weight Bearing Restrictions: No     Mobility  Bed Mobility Overal bed mobility: Modified Independent                  Transfers Overall transfer level: Needs assistance Equipment used: None Transfers: Sit to/from Stand Sit to Stand: Min guard           General transfer comment: min guard for safety. Reaching for therapist for assist but min guard for safety    Ambulation/Gait Ambulation/Gait assistance: Min guard Gait Distance (Feet): 150 Feet (+100') Assistive device: 1 person hand held assist Gait Pattern/deviations: Step-through pattern, Decreased stride length, Knee  flexed in stance - right Gait velocity: decreased     General Gait Details: min guard for safety with use of HHAx1 to mimic cane use at home. No overt LOB but unsteady throughout   Stairs Stairs: Yes Stairs assistance: Min guard Stair Management: One rail Right, Alternating pattern, Forwards Number of Stairs: 3 General stair comments: min guard for safety. Not following instructions of ascending 1 stair in preparation for home and continued up 3 stairs prior to stopping.   Wheelchair Mobility    Modified Rankin (Stroke Patients Only)       Balance Overall balance assessment: Needs assistance Sitting-balance support: No upper extremity supported Sitting balance-Leahy Scale: Good     Standing balance support: Single extremity supported, During functional activity Standing balance-Leahy Scale: Fair                              Cognition Arousal/Alertness: Awake/alert Behavior During Therapy: WFL for tasks assessed/performed, Impulsive Overall Cognitive Status: Impaired/Different from baseline Area of Impairment: Safety/judgement, Memory                     Memory: Decreased short-term memory   Safety/Judgement: Decreased awareness of safety, Decreased awareness of deficits     General Comments: decreased insight into deficits and need for AD for safety. STM deficits noted during session with patient forgetful of information discussed        Exercises      General Comments General comments (skin integrity, edema, etc.): educated patient on use of cane  and/or RW for moblity at discharge for stability and to reduce fall risk, patient hesitant to agree but wife and daughter in agreement      Pertinent Vitals/Pain Pain Assessment Pain Assessment: No/denies pain    Home Living                          Prior Function            PT Goals (current goals can now be found in the care plan section) Acute Rehab PT Goals Patient Stated  Goal: to go home today PT Goal Formulation: With patient Time For Goal Achievement: 03/29/22 Potential to Achieve Goals: Good Progress towards PT goals: Progressing toward goals    Frequency    Min 3X/week      PT Plan Current plan remains appropriate    Co-evaluation              AM-PAC PT "6 Clicks" Mobility   Outcome Measure  Help needed turning from your back to your side while in a flat bed without using bedrails?: None Help needed moving from lying on your back to sitting on the side of a flat bed without using bedrails?: None Help needed moving to and from a bed to a chair (including a wheelchair)?: None Help needed standing up from a chair using your arms (e.g., wheelchair or bedside chair)?: A Little Help needed to walk in hospital room?: A Little Help needed climbing 3-5 steps with a railing? : A Little 6 Click Score: 21    End of Session Equipment Utilized During Treatment: Gait belt Activity Tolerance: Patient tolerated treatment well Patient left: in chair;with call bell/phone within reach;with family/visitor present Nurse Communication: Mobility status PT Visit Diagnosis: Unsteadiness on feet (R26.81);Other abnormalities of gait and mobility (R26.89)     Time: 6578-4696 PT Time Calculation (min) (ACUTE ONLY): 36 min  Charges:  $Gait Training: 23-37 mins                     Adalynn Corne A. Gilford Rile PT, DPT Acute Rehabilitation Services Office [phone]    Linna Hoff 03/23/2022, 2:41 PM

## 2022-03-23 NOTE — Discharge Instructions (Addendum)
Dear John Gamble,  Thank you for letting us participate in your care. You were hospitalized for bloody bowel movements and diagnosed with Lower GI bleed. You were treated with bowel rest.   POST-HOSPITAL & CARE INSTRUCTIONS You can advance your diet as you can tolerate.  I would recommend starting slow with applesauce, toast, grits, ect.  And see how you are able to tolerate. Continue taking your MiraLAX at home to avoid being constipated. Your gastroenterologist has recommended holding your aspirin for 5 more days. No strenuous activity for 1 week. Go to your follow up appointments (listed below)   DOCTOR'S APPOINTMENT   No future appointments.  Outpatient GI follow up as needed with Dr. Earlean Shawl, [facility].   Take care and be well!  [facility]  [place], [place] [address] [phone]

## 2022-03-23 NOTE — Progress Notes (Signed)
Patient had two bloody bm this morning, red. MD aware instructed to move forward with discharge

## 2022-03-28 ENCOUNTER — Emergency Department (HOSPITAL_COMMUNITY): Payer: Medicare Other

## 2022-03-28 ENCOUNTER — Emergency Department (HOSPITAL_COMMUNITY)
Admission: EM | Admit: 2022-03-28 | Discharge: 2022-03-28 | Disposition: A | Discharge status: Home / Self Care | Payer: Medicare Other | Attending: Student | Admitting: Student

## 2022-03-28 ENCOUNTER — Encounter (HOSPITAL_COMMUNITY): Payer: Self-pay | Admitting: Emergency Medicine

## 2022-03-28 ENCOUNTER — Other Ambulatory Visit: Payer: Self-pay

## 2022-03-28 DIAGNOSIS — D72829 Elevated white blood cell count, unspecified: Secondary | ICD-10-CM | POA: Insufficient documentation

## 2022-03-28 DIAGNOSIS — I2510 Atherosclerotic heart disease of native coronary artery without angina pectoris: Secondary | ICD-10-CM | POA: Diagnosis not present

## 2022-03-28 DIAGNOSIS — K5730 Diverticulosis of large intestine without perforation or abscess without bleeding: Secondary | ICD-10-CM | POA: Diagnosis not present

## 2022-03-28 DIAGNOSIS — I252 Old myocardial infarction: Secondary | ICD-10-CM | POA: Diagnosis not present

## 2022-03-28 DIAGNOSIS — R531 Weakness: Secondary | ICD-10-CM | POA: Insufficient documentation

## 2022-03-28 DIAGNOSIS — I509 Heart failure, unspecified: Secondary | ICD-10-CM | POA: Diagnosis not present

## 2022-03-28 DIAGNOSIS — I110 Hypertensive heart disease with heart failure: Secondary | ICD-10-CM | POA: Insufficient documentation

## 2022-03-28 DIAGNOSIS — N401 Enlarged prostate with lower urinary tract symptoms: Secondary | ICD-10-CM | POA: Insufficient documentation

## 2022-03-28 DIAGNOSIS — Z7982 Long term (current) use of aspirin: Secondary | ICD-10-CM | POA: Insufficient documentation

## 2022-03-28 DIAGNOSIS — I4891 Unspecified atrial fibrillation: Secondary | ICD-10-CM | POA: Insufficient documentation

## 2022-03-28 DIAGNOSIS — K760 Fatty (change of) liver, not elsewhere classified: Secondary | ICD-10-CM | POA: Diagnosis not present

## 2022-03-28 DIAGNOSIS — R338 Other retention of urine: Secondary | ICD-10-CM | POA: Insufficient documentation

## 2022-03-28 DIAGNOSIS — Z87891 Personal history of nicotine dependence: Secondary | ICD-10-CM | POA: Insufficient documentation

## 2022-03-28 DIAGNOSIS — I700 Atherosclerosis of aorta: Secondary | ICD-10-CM | POA: Diagnosis not present

## 2022-03-28 DIAGNOSIS — R102 Pelvic and perineal pain: Secondary | ICD-10-CM | POA: Diagnosis not present

## 2022-03-28 DIAGNOSIS — E1165 Type 2 diabetes mellitus with hyperglycemia: Secondary | ICD-10-CM | POA: Insufficient documentation

## 2022-03-28 DIAGNOSIS — K8020 Calculus of gallbladder without cholecystitis without obstruction: Secondary | ICD-10-CM | POA: Insufficient documentation

## 2022-03-28 DIAGNOSIS — I10 Essential (primary) hypertension: Secondary | ICD-10-CM | POA: Diagnosis not present

## 2022-03-28 DIAGNOSIS — N323 Diverticulum of bladder: Secondary | ICD-10-CM | POA: Diagnosis not present

## 2022-03-28 DIAGNOSIS — N3090 Cystitis, unspecified without hematuria: Secondary | ICD-10-CM

## 2022-03-28 DIAGNOSIS — Z20822 Contact with and (suspected) exposure to covid-19: Secondary | ICD-10-CM | POA: Insufficient documentation

## 2022-03-28 LAB — TROPONIN I (HIGH SENSITIVITY)
Troponin I (High Sensitivity): 57 ng/L — ABNORMAL HIGH (ref <18)
Troponin I (High Sensitivity): 31 ng/L — ABNORMAL HIGH (ref <18)

## 2022-03-28 LAB — URINALYSIS, ROUTINE W REFLEX MICROSCOPIC
Bilirubin Urine: NEGATIVE
Glucose, UA: >=500 mg/dL — AB
Ketones, ur: 20 mg/dL — AB
Nitrite: NEGATIVE
Protein, ur: 30 mg/dL — AB
Specific Gravity, Urine: 1.015 (ref 1.005–1.030)
WBC, UA: >50 WBC/hpf — ABNORMAL HIGH (ref 0–5)
pH: 5 (ref 5.0–8.0)

## 2022-03-28 LAB — CBC WITH DIFFERENTIAL/PLATELET
Abs Immature Granulocytes: 0.06 10*3/uL (ref 0.00–0.07)
Basophils Absolute: 0 10*3/uL (ref 0.0–0.1)
Basophils Relative: 0 %
Eosinophils Absolute: 0 10*3/uL (ref 0.0–0.5)
Eosinophils Relative: 0 %
HCT: 37.1 % — ABNORMAL LOW (ref 39.0–52.0)
Hemoglobin: 12.7 g/dL — ABNORMAL LOW (ref 13.0–17.0)
Immature Granulocytes: 1 %
Lymphocytes Relative: 2 %
Lymphs Abs: 0.2 10*3/uL — ABNORMAL LOW (ref 0.7–4.0)
MCH: 30.3 pg (ref 26.0–34.0)
MCHC: 34.2 g/dL (ref 30.0–36.0)
MCV: 88.5 fL (ref 80.0–100.0)
Monocytes Absolute: 0.4 10*3/uL (ref 0.1–1.0)
Monocytes Relative: 3 %
Neutro Abs: 10 10*3/uL — ABNORMAL HIGH (ref 1.7–7.7)
Neutrophils Relative %: 94 %
Platelets: 225 10*3/uL (ref 150–400)
RBC: 4.19 MIL/uL — ABNORMAL LOW (ref 4.22–5.81)
RDW: 13.5 % (ref 11.5–15.5)
WBC: 10.7 10*3/uL — ABNORMAL HIGH (ref 4.0–10.5)
nRBC: 0 % (ref 0.0–0.2)

## 2022-03-28 LAB — COMPREHENSIVE METABOLIC PANEL
ALT: 20 U/L (ref 0–44)
AST: 19 U/L (ref 15–41)
Albumin: 3.5 g/dL (ref 3.5–5.0)
Alkaline Phosphatase: 71 U/L (ref 38–126)
Anion gap: 11 (ref 5–15)
BUN: 12 mg/dL (ref 8–23)
CO2: 19 mmol/L — ABNORMAL LOW (ref 22–32)
Calcium: 8.9 mg/dL (ref 8.9–10.3)
Chloride: 103 mmol/L (ref 98–111)
Creatinine, Ser: 1.11 mg/dL (ref 0.61–1.24)
GFR, Estimated: >60 mL/min (ref >60)
Glucose, Bld: 309 mg/dL — ABNORMAL HIGH (ref 70–99)
Potassium: 3.7 mmol/L (ref 3.5–5.1)
Sodium: 133 mmol/L — ABNORMAL LOW (ref 135–145)
Total Bilirubin: 1.4 mg/dL — ABNORMAL HIGH (ref 0.3–1.2)
Total Protein: 5.9 g/dL — ABNORMAL LOW (ref 6.5–8.1)

## 2022-03-28 LAB — RESP PANEL BY RT-PCR (FLU A&B, COVID) ARPGX2
Influenza A by PCR: NEGATIVE
Influenza B by PCR: NEGATIVE
SARS Coronavirus 2 by RT PCR: NEGATIVE

## 2022-03-28 LAB — LACTIC ACID, PLASMA: Lactic Acid, Venous: 1.5 mmol/L (ref 0.5–1.9)

## 2022-03-28 LAB — BRAIN NATRIURETIC PEPTIDE: B Natriuretic Peptide: 256.4 pg/mL — ABNORMAL HIGH (ref 0.0–100.0)

## 2022-03-28 LAB — CBG MONITORING, ED: Glucose-Capillary: 245 mg/dL — ABNORMAL HIGH (ref 70–99)

## 2022-03-28 MED ORDER — FINASTERIDE 5 MG PO TABS
5.0000 mg | ORAL_TABLET | Freq: Every day | ORAL | Status: DC
  Filled 2022-03-28: qty 1

## 2022-03-28 MED ORDER — CEFADROXIL 500 MG PO CAPS: 500.0000 mg | ORAL_CAPSULE | Freq: Two times a day (BID) | ORAL | 0 refills | Status: AC

## 2022-03-28 MED ORDER — SODIUM CHLORIDE 0.9 % IV SOLN
1.0000 g | Freq: Once | INTRAVENOUS | Status: AC
  Administered 2022-03-28: 1 g via INTRAVENOUS
  Filled 2022-03-28: qty 10

## 2022-03-28 MED ORDER — LACTATED RINGERS IV BOLUS
1000.0000 mL | Freq: Once | INTRAVENOUS | Status: AC
  Administered 2022-03-28: 1000 mL via INTRAVENOUS

## 2022-03-28 MED ORDER — IOHEXOL 300 MG/ML  SOLN
100.0000 mL | Freq: Once | INTRAMUSCULAR | Status: AC | PRN
  Administered 2022-03-28: 100 mL via INTRAVENOUS

## 2022-03-28 NOTE — ED Provider Notes (Signed)
[facility] EMERGENCY DEPARTMENT Provider Note  CSN: [id number] Arrival date & time: 03/28/22 0015  Chief Complaint(s) Weakness  HPI NEFTALI ABAIR is a 88 y.o. male with PMH CAD status post MI, HTN, HLD, atrial fibrillation not on anticoagulation, diverticular disease with recent hospital admission for a diverticular bleed who presents emergency department for evaluation of generalized fatigue, suprapubic abdominal pain and 2 episodes of shaking.  History obtained from both patient and patient's wife who states that yesterday, the patient had episodes of shivering, but was alert and oriented during these episodes with no postictal state and no tongue bite or involuntary urination.  Patient states that he has had some mild decreased p.o. intake and has been feeling dehydrated since discharge.  Patient unfortunately waited in the emergency department lobby for approximately 8 hours with no return of his symptoms.  On my evaluation, patient is alert and oriented answering all questions appropriately with no complaints of chest pain, shortness of breath, headache, fever or other systemic symptoms.  He does endorse mild suprapubic abdominal pain.  Endorses incomplete bladder emptying.   Past Medical History Past Medical History:  Diagnosis Date   Coronary artery disease    Hyperlipidemia    Hypertension    MI, old    OA (osteoarthritis)    Pneumonia    hx of 2013    Patient Active Problem List   Diagnosis Date Noted   Low blood magnesium 03/22/2022   Low serum potassium level 03/22/2022   Lower GI bleed 03/21/2022   Diverticulosis of colon with hemorrhage    Gastritis and gastroduodenitis    PVC's (premature ventricular contractions)    Platelet inhibition due to Plavix    Hematochezia    Acute blood loss anemia    GI bleed 07/30/2021   Urinary retention 11/06/2020   Hematuria 11/06/2020   Unstable angina ([place]) 11/05/2020   Acute on chronic combined systolic and  diastolic CHF (congestive heart failure) ([place]) 11/04/2020   Dyspnea on exertion    Type 2 diabetes mellitus without complication, without long-term current use of insulin (HCC) 08/21/2017   Abnormal nuclear stress test 05/06/2015   Primary osteoarthritis of knee 09/19/2014   S/P knee replacement 09/19/2014   S/P total knee arthroplasty 09/19/2014   Lower gastrointestinal bleed 01/30/2012   Coronary artery disease    MI, old    Hypertension    Hyperlipidemia    Home Medication(s) Prior to Admission medications   Medication Sig Start Date End Date Taking? Authorizing Provider  acyclovir (ZOVIRAX) 800 MG tablet Take 800 mg by mouth daily.  03/03/14   [provider]  aspirin EC 81 MG tablet Take 1 tablet (81 mg total) by mouth daily. 03/28/22   Sharion Settler, DO  dorzolamide (TRUSOPT) 2 % ophthalmic solution Place 1 drop into both eyes 2 (two) times daily.    [provider]  finasteride (PROSCAR) 5 MG tablet Take 1 tablet (5 mg total) by mouth daily. 11/20/20   [name], Peter M, MD  Glucosamine-Chondroitin 250-200 MG TABS Take 2 tablets by mouth 2 (two) times daily.    [provider]  glucose blood (ONETOUCH ULTRA) test strip USE 1 STRIP DAILY. 08/20/20   [provider]  Lancets (ONETOUCH DELICA PLUS [id number]) Laurium SMARTSIG:1 Unit(s) Topical Daily 07/01/20   [provider]  losartan (COZAAR) 50 MG tablet Take 1 tablet (50 mg total) by mouth daily. 03/02/22 02/25/23  [name], Peter M, MD  metoprolol succinate (TOPROL-XL) 25 MG 24  hr tablet Take 1 tablet (25 mg total) by mouth daily. 09/21/21   [name], Peter M, MD  Multiple Vitamin (MULTIVITAMIN WITH MINERALS) TABS tablet Take 1 tablet by mouth daily. Centrum Silver    [provider]  Omega-3 Fatty Acids (FISH OIL) 1200 MG CAPS Take 1,200 mg by mouth daily.    [provider]  [facility] ULTRA test strip daily. 08/20/20   [provider]  pantoprazole (PROTONIX) 40 MG tablet  Take 1 tablet (40 mg total) by mouth daily at 6 (six) AM. 08/06/21   Orvis Brill, MD  polyethylene glycol (MIRALAX / Floria Raveling) packet Take 17 g by mouth daily as needed for mild constipation. Mix with 8 oz liquid and drink    [provider]  prednisoLONE acetate (PRED FORTE) 1 % ophthalmic suspension Place 1 drop into the left eye at bedtime. 09/12/20   [provider]  rosuvastatin (CRESTOR) 20 MG tablet Take 20 mg by mouth daily.    [provider]  silodosin (RAPAFLO) 8 MG CAPS capsule Take 8 mg by mouth daily. 12/04/20   [provider]  spironolactone (ALDACTONE) 25 MG tablet Take 0.5 tablets (12.5 mg total) by mouth daily. 01/13/22   [name], Peter M, MD  Zinc 50 MG CAPS Take 50 mg by mouth daily with supper.     [provider]                                                                                                                                    Past Surgical History Past Surgical History:  Procedure Laterality Date   BIOPSY  08/04/2021   Procedure: BIOPSY;  Surgeon: Irene Shipper, MD;  Location: [facility] ENDOSCOPY;  Service: Endoscopy;;   CARDIAC CATHETERIZATION  01/26/1999   EF 55%   CARDIAC CATHETERIZATION N/A 05/06/2015   Procedure: Left Heart Cath and Coronary Angiography;  Surgeon: Peter M [name], MD;  Location: [place] CV LAB;  Service: Cardiovascular;  Laterality: N/A;   CARDIOVASCULAR STRESS TEST  02/22/2010   EF 61%   COLONOSCOPY WITH PROPOFOL N/A 08/04/2021   Procedure: COLONOSCOPY WITH PROPOFOL;  Surgeon: Irene Shipper, MD;  Location: [facility] ENDOSCOPY;  Service: Endoscopy;  Laterality: N/A;   corneal implant     left   CORONARY ATHERECTOMY N/A 11/04/2020   Procedure: CORONARY ATHERECTOMY;  Surgeon: [name], Peter M, MD;  Location: [place] CV LAB;  Service: Cardiovascular;  Laterality: N/A;   CORONARY STENT INTERVENTION N/A 11/04/2020   Procedure: CORONARY STENT INTERVENTION;  Surgeon: [name], Peter M, MD;  Location: [place] CV LAB;  Service: Cardiovascular;  Laterality: N/A;   CORONARY STENT PLACEMENT  2000   BMS LEFT CIRCUMFLEX CORONARY   ESOPHAGOGASTRODUODENOSCOPY N/A 08/04/2021   Procedure: ESOPHAGOGASTRODUODENOSCOPY (EGD);  Surgeon: Irene Shipper, MD;  Location[facility] ENDOSCOPY;  Service: Endoscopy;  Laterality: N/A;   INGUINAL HERNIA REPAIR     INTRAVASCULAR ULTRASOUND/IVUS  N/A 11/04/2020   Procedure: Intravascular Ultrasound/IVUS;  Surgeon: [name], Peter M, MD;  Location: [place] CV LAB;  Service: Cardiovascular;  Laterality: N/A;   REPLACEMENT TOTAL KNEE     RIGHT/LEFT HEART CATH AND CORONARY ANGIOGRAPHY N/A 10/27/2020   Procedure: RIGHT/LEFT HEART CATH AND CORONARY ANGIOGRAPHY;  Surgeon: [name], Peter M, MD;  Location: [place] CV LAB;  Service: Cardiovascular;  Laterality: N/A;   TOTAL KNEE ARTHROPLASTY Left 09/19/2014   Procedure: LEFT TOTAL KNEE ARTHROPLASTY;  Surgeon: Sydnee Cabal, MD;  Location: [facility] ORS;  Service: Orthopedics;  Laterality: Left;   Family History Family History  Problem Relation Age of Onset   Cancer Mother    Cancer Father     Social History Social History   Tobacco Use   Smoking status: Former    Packs/day: 0.50    Years: 50.00    Total pack years: 25.00    Types: Cigarettes    Quit date: 02/23/1989    Years since quitting: 33.1   Smokeless tobacco: Never  Vaping Use   Vaping Use: Never used  Substance Use Topics   Alcohol use: No   Drug use: No   Allergies Atorvastatin, Oysters [shellfish allergy], and Sulfa antibiotics  Review of Systems Review of Systems  Constitutional:  Positive for chills and fatigue.  Genitourinary:  Positive for difficulty urinating.    Physical Exam Vital Signs  I have reviewed the triage vital signs BP (!) 102/50 (BP Location: Left Arm)   Pulse 81   Temp 97.6 F (36.4 C) (Oral)   Resp 16   SpO2 98%   Physical Exam Constitutional:      General: He is not in acute distress.    Appearance: Normal appearance.   HENT:     Head: Normocephalic and atraumatic.     Nose: No congestion or rhinorrhea.  Eyes:     General:        Right eye: No discharge.        Left eye: No discharge.     Extraocular Movements: Extraocular movements intact.     Pupils: Pupils are equal, round, and reactive to light.  Cardiovascular:     Rate and Rhythm: Normal rate. Rhythm irregular.     Heart sounds: No murmur heard. Pulmonary:     Effort: No respiratory distress.     Breath sounds: No wheezing or rales.  Abdominal:     General: There is no distension.     Tenderness: There is abdominal tenderness.  Musculoskeletal:        General: Normal range of motion.     Cervical back: Normal range of motion.  Skin:    General: Skin is warm and dry.  Neurological:     General: No focal deficit present.     Mental Status: He is alert.     ED Results and Treatments Labs (all labs ordered are listed, but only abnormal results are displayed) Labs Reviewed  COMPREHENSIVE METABOLIC PANEL - Abnormal; Notable for the following components:      Result Value   Sodium 133 (*)    CO2 19 (*)    Glucose, Bld 309 (*)    Total Protein 5.9 (*)    Total Bilirubin 1.4 (*)    All other components within normal limits  CBC WITH DIFFERENTIAL/PLATELET - Abnormal; Notable for the following components:   WBC 10.7 (*)    RBC 4.19 (*)    Hemoglobin 12.7 (*)    HCT 37.1 (*)  Neutro Abs 10.0 (*)    Lymphs Abs 0.2 (*)    All other components within normal limits  URINALYSIS, ROUTINE W REFLEX MICROSCOPIC - Abnormal; Notable for the following components:   APPearance CLOUDY (*)    Glucose, UA >=500 (*)    Hgb urine dipstick SMALL (*)    Ketones, ur 20 (*)    Protein, ur 30 (*)    Leukocytes,Ua LARGE (*)    WBC, UA >50 (*)    Bacteria, UA MANY (*)    All other components within normal limits  BRAIN NATRIURETIC PEPTIDE - Abnormal; Notable for the following components:   B Natriuretic Peptide 256.4 (*)    All other components  within normal limits  TROPONIN I (HIGH SENSITIVITY) - Abnormal; Notable for the following components:   Troponin I (High Sensitivity) 57 (*)    All other components within normal limits  LACTIC ACID, PLASMA  CBG MONITORING, ED  TROPONIN I (HIGH SENSITIVITY)                                                                                                                          Radiology DG Chest 2 View  Result Date: 03/28/2022 CLINICAL DATA:  Generalized weakness. EXAM: CHEST - 2 VIEW COMPARISON:  March 26, 2017 FINDINGS: The cardiac silhouette is mildly enlarged and unchanged in size. A coronary artery stent is seen. There is no evidence of and acute infiltrate, pleural effusion or pneumothorax. Multilevel degenerative changes are seen throughout the thoracic spine. IMPRESSION: No active cardiopulmonary disease. Electronically Signed   By: Virgina Norfolk M.D.   On: 03/28/2022 00:58    Pertinent labs & imaging results that were available during my care of the patient were reviewed by me and considered in my medical decision making (see MDM for details).  Medications Ordered in ED Medications - No data to display                                                                                                                                   Procedures Procedures  (including critical care time)  Medical Decision Making / ED Course   This patient presents to the ED for concern of fatigue, abdominal pain, this involves an extensive number of treatment options, and is a complaint that carries with it a high risk of complications and morbidity.  The differential diagnosis includes UTI, urinary obstruction,  intra-abdominal infection, electrolyte abnormality, dehydration, rigors, seizures  MDM: Patient seen in the emergency room for evaluation of multiple complaints as described above.  Physical exam largely unremarkable outside of mild suprapubic abdominal pain and an irregular heart rate.   Laboratory evaluation with hyperglycemia to 309 secondary to steroid use but no anion gap.  Mild leukocytosis to 10.7, hemoglobin fairly stable at 12.7, initial troponin 57 but downtrending to 31 on delta.  Repeat CBG at 245.  COVID and flu negative.  Chest x-ray unremarkable.  Urinalysis concerning for infection with large leuk esterase, greater than 50 white blood cells and many bacteria.  Rocephin was initiated and a urine culture was sent.  CT abdomen pelvis obtained that shows cholelithiasis without evidence of cholecystitis, diverticulosis with no diverticulitis and an enlarged prostate with bladder outlet obstruction seen via bladder diverticula.  Patient's creatinine is normal at 1.11 which is not significantly off of baseline and patient was encouraged to continue his Proscar and he will need to follow-up outpatient with his urologist Dr. Milford Cage.  Patient given fluid resuscitation and was able to ambulate without difficulty here in the emergency department on reevaluation.  Patient then discharged with a prescription for [facility] and was given return precautions of which he voiced understanding.  I also communicated these findings and return precautions to his wife and daughter.   Additional history obtained: -Additional history obtained from wife and daughter -External records from outside source obtained and reviewed including: Chart review including previous notes, labs, imaging, consultation notes   Lab Tests: -I ordered, reviewed, and interpreted labs.   The pertinent results include:   Labs Reviewed  COMPREHENSIVE METABOLIC PANEL - Abnormal; Notable for the following components:      Result Value   Sodium 133 (*)    CO2 19 (*)    Glucose, Bld 309 (*)    Total Protein 5.9 (*)    Total Bilirubin 1.4 (*)    All other components within normal limits  CBC WITH DIFFERENTIAL/PLATELET - Abnormal; Notable for the following components:   WBC 10.7 (*)    RBC 4.19 (*)    Hemoglobin 12.7 (*)     HCT 37.1 (*)    Neutro Abs 10.0 (*)    Lymphs Abs 0.2 (*)    All other components within normal limits  URINALYSIS, ROUTINE W REFLEX MICROSCOPIC - Abnormal; Notable for the following components:   APPearance CLOUDY (*)    Glucose, UA >=500 (*)    Hgb urine dipstick SMALL (*)    Ketones, ur 20 (*)    Protein, ur 30 (*)    Leukocytes,Ua LARGE (*)    WBC, UA >50 (*)    Bacteria, UA MANY (*)    All other components within normal limits  BRAIN NATRIURETIC PEPTIDE - Abnormal; Notable for the following components:   B Natriuretic Peptide 256.4 (*)    All other components within normal limits  TROPONIN I (HIGH SENSITIVITY) - Abnormal; Notable for the following components:   Troponin I (High Sensitivity) 57 (*)    All other components within normal limits  LACTIC ACID, PLASMA  CBG MONITORING, ED  TROPONIN I (HIGH SENSITIVITY)      EKG   EKG Interpretation  Date/Time:  Monday March 28 2022 08:04:05 EDT Ventricular Rate:  102 PR Interval:    QRS Duration: 143 QT Interval:  389 QTC Calculation: 463 R Axis:   2 Text Interpretation: Normal sinus rhythm Premature ventricular complexes Confirmed by Jelene Albano ([id number]) on 03/28/2022  8:20:49 AM         Imaging Studies ordered: I ordered imaging studies including chest x-ray, CT AP I independently visualized and interpreted imaging. I agree with the radiologist interpretation   Medicines ordered and prescription drug management: No orders of the defined types were placed in this encounter.   -I have reviewed the patients home medicines and have made adjustments as needed  Critical interventions none   Cardiac Monitoring: The patient was maintained on a cardiac monitor.  I personally viewed and interpreted the cardiac monitored which showed an underlying rhythm of: NSR, PVCs, intermittent A-fib  Social Determinants of Health:  Factors impacting patients care include: none   Reevaluation: After the interventions  noted above, I reevaluated the patient and found that they have :improved  Co morbidities that complicate the patient evaluation  Past Medical History:  Diagnosis Date   Coronary artery disease    Hyperlipidemia    Hypertension    MI, old    OA (osteoarthritis)    Pneumonia    hx of 2013       Dispostion: I considered admission for this patient, but he currently does not meet inpatient criteria for admission and is safe for discharge with outpatient urologic follow-up with return precautions and antibiotics.     Final Clinical Impression(s) / ED Diagnoses Final diagnoses:  None     '@PCDICTATION'$ @    Serenity Fortner, Debe Coder, MD 03/28/22 1045

## 2022-03-28 NOTE — ED Provider Triage Note (Signed)
Emergency Medicine Provider Triage Evaluation Note  John Gamble , a 88 y.o. male  was evaluated in triage.  Pt complains of generalized weakness, difficulty getting out of bed since recent discharge to the hospital following diverticular bleed that did not require blood transfusion.  Denies any melena or hematochezia at this time.  Review of Systems  Positive: Generalized weakness, fatigue Negative: Chest pain, shortness of breath, palpitations, melena, hematochezia  Physical Exam  BP 111/72   Pulse (!) 108   Temp 98 F (36.7 C) (Oral)   Resp 18   SpO2 94%  Gen:   Awake, no distress   Resp:  Normal effort  MSK:   Moves extremities without difficulty  Other:  Tachycardic with regular rhythm.  Lungs with rales in the bases.  Abdomen soft nondistended nontender.  No lower extremity edema.  ANO x3.  Medical Decision Making  Medically screening exam initiated at 12:28 AM.  Appropriate orders placed.  John Gamble was informed that the remainder of the evaluation will be completed by another provider, this initial triage assessment does not replace that evaluation, and the importance of remaining in the ED until their evaluation is complete.  Workup initiated.   This chart was dictated using voice recognition software, Dragon. Despite the best efforts of this provider to proofread and correct errors, errors may still occur which can change documentation meaning.    Emeline Darling, PA-C 03/28/22 0031

## 2022-03-28 NOTE — ED Triage Notes (Signed)
Patient from home, with weakness, not feeling well.  Patient was admitted earlier in the week with GI bleed and was discharged on Wednesday.  Patient has been working normally at home, instead of taking it easy and now feeling weak.  No bleeding or diarrhea at the time.  Sugars are running a little high and patient is taking prednisone.  CBG of 270 by EMS.

## 2022-03-30 LAB — URINE CULTURE: Culture: >=100000 — AB

## 2022-03-31 ENCOUNTER — Telehealth: Payer: Self-pay | Admitting: *Deleted

## 2022-03-31 NOTE — Telephone Encounter (Signed)
Post ED Visit - Positive Culture Follow-up  Culture report reviewed by antimicrobial stewardship pharmacist: [place] Team '[]'$  Elenor Quinones, Pharm.D. '[]'$  Heide Guile, Pharm.D., BCPS AQ-ID '[]'$  Parks Neptune, Pharm.D., BCPS '[]'$  Alycia Rossetti, Pharm.D., BCPS '[]'$  [place], Pharm.D., BCPS, AAHIVP '[]'$  Legrand Como, Pharm.D., BCPS, AAHIVP '[]'$  Salome Arnt, PharmD, BCPS '[]'$  Johnnette Gourd, PharmD, BCPS '[]'$  Hughes Better, PharmD, BCPS '[]'$  Leeroy Cha, PharmD '[]'$  Laqueta Linden, PharmD, BCPS '[]'$  Albertina Parr, PharmD  [place] Team '[]'$  Leodis Sias, PharmD '[]'$  Lindell Spar, PharmD '[]'$  Royetta Asal, PharmD '[]'$  Graylin Shiver, Rph '[]'$  Rema Fendt) Glennon Mac, PharmD '[]'$  Arlyn Dunning, PharmD '[]'$  Netta Cedars, PharmD '[]'$  Dia Sitter, PharmD '[]'$  Leone [place], PharmD '[]'$  Gretta Arab, PharmD '[]'$  Theodis Shove, PharmD '[]'$  Peggyann Juba, PharmD '[]'$  Reuel Boom, PharmD   Positive urine culture Treated with Cefadroxil, organism sensitive to the same and no further patient follow-up is required at this time. [name], PharmD  John Gamble 03/31/2022, 10:11 AM

## 2022-08-22 ENCOUNTER — Ambulatory Visit: Payer: Medicare Other | Admitting: Podiatry

## 2022-08-22 ENCOUNTER — Other Ambulatory Visit: Payer: Self-pay | Admitting: Cardiology

## 2022-08-22 DIAGNOSIS — M79675 Pain in left toe(s): Secondary | ICD-10-CM | POA: Diagnosis not present

## 2022-08-22 DIAGNOSIS — M79674 Pain in right toe(s): Secondary | ICD-10-CM

## 2022-08-22 DIAGNOSIS — B353 Tinea pedis: Secondary | ICD-10-CM | POA: Diagnosis not present

## 2022-08-22 DIAGNOSIS — G629 Polyneuropathy, unspecified: Secondary | ICD-10-CM

## 2022-08-22 DIAGNOSIS — L84 Corns and callosities: Secondary | ICD-10-CM | POA: Diagnosis not present

## 2022-08-22 DIAGNOSIS — B351 Tinea unguium: Secondary | ICD-10-CM | POA: Diagnosis not present

## 2022-08-22 MED ORDER — KETOCONAZOLE 2 % EX CREA: 1.0000 | TOPICAL_CREAM | Freq: Every day | CUTANEOUS | 0 refills | Status: DC

## 2022-08-22 NOTE — Progress Notes (Signed)
  Subjective:  Patient ID: John Gamble, male    DOB: 1930/12/04,  MRN: [id number]  Chief Complaint  Patient presents with   Callouses    Callus removal    Nail Problem    Routine Foot Care     89 y.o. male presents with the above complaint. History confirmed with patient. Patient presenting with pain related to dystrophic thickened elongated nails. Patient is unable to trim own nails related to nail dystrophy and/or mobility issues. Patient does not have a history of T2DM. Patient doest have callus present located at the bottom of the right foot, distal right 3rd toe, left 2nd toe causing pain.   Objective:  Physical Exam: warm, good capillary refill nail exam onychomycosis of the toenails, onycholysis, and dystrophic nails. Peeling dry skin bilateral foot, mild erythematous rash.  DP pulses palpable, PT pulses palpable, and protective sensation diminished to forefoot.  Left Foot:  Pain with palpation of nails due to elongation and dystrophic growth. Hyperkeratotic lesion distal tuft of 2nd toe left foot.  Right Foot: Pain with palpation of nails due to elongation and dystrophic growth. Hyperkeratotic lesion sub 3rd met head right foot and distal right 3rd toe.  Assessment:   1. Pain due to onychomycosis of toenails of both feet   2. Pre-ulcerative calluses   3. Neuropathy   4. Tinea pedis of both feet      Plan:  Patient was evaluated and treated and all questions answered.  #tinea pedis bilateral Discussed the etiology and treatment options for tinea pedis.  Discussed topical and oral treatment.  Recommended topical treatment with 2% ketoconazole cream.  This was sent to the patient's pharmacy.  Also discussed appropriate foot hygiene, use of antifungal spray such as Tinactin in shoes, as well as cleaning her foot surfaces such as showers and bathroom floors with bleach.   #Hyperkeratotic lesions/pre ulcerative calluses present sub 3rd met head right foot, distal left 2nd toe  and distal right 3rd toe All symptomatic hyperkeratoses x 3 separate lesions were safely debrided with a sterile #10 blade to patient's level of comfort without incident. We discussed preventative and palliative care of these lesions including supportive and accommodative shoegear, padding, prefabricated and custom molded accommodative orthoses, use of a pumice stone and lotions/creams daily.  #Onychomycosis with pain  -Nails palliatively debrided as below. -Educated on self-care  Procedure: Nail Debridement Rationale: Pain Type of Debridement: manual, sharp debridement. Instrumentation: Nail nipper, rotary burr. Number of Nails: 10  Return in about 3 months (around 11/21/2022) for RFC.         Everitt Amber, DPM [facility]

## 2022-08-26 ENCOUNTER — Other Ambulatory Visit: Payer: Self-pay

## 2022-08-26 ENCOUNTER — Other Ambulatory Visit (INDEPENDENT_AMBULATORY_CARE_PROVIDER_SITE_OTHER): Payer: Medicare Other | Admitting: Podiatry

## 2022-08-26 DIAGNOSIS — B351 Tinea unguium: Secondary | ICD-10-CM

## 2022-08-26 MED ORDER — KETOCONAZOLE 2 % EX CREA: 1.0000 | TOPICAL_CREAM | Freq: Every day | CUTANEOUS | 0 refills | Status: DC

## 2022-08-26 NOTE — Progress Notes (Signed)
Rx for ketoconazole cream sent to the pharmacy as pt states they did not have it

## 2022-08-29 ENCOUNTER — Ambulatory Visit: Payer: Medicare Other | Admitting: Podiatry

## 2022-09-20 NOTE — Progress Notes (Signed)
Cardiology Office Note    Date:  09/26/2022   ID:  John Gamble, DOB Oct 13, 1930, MRN [id number]  PCP:  John Greenhouse, MD  Cardiologist:  John Scarpelli [name], MD   Chief Complaint  Patient presents with   Coronary Artery Disease       History of Present Illness: John Gamble is a 89 y.o. male who presents for follow up CAD. He has a history of coronary disease and is status post stenting of the left circumflex coronary in 2000. He had a normal stress Myoview in July of 2011. He also has a history of hypertension and hyperlipidemia.   In September 2016 he had a nuclear stress test that was abnormal. This led to a cardiac cath.. Stents were patent but he did have ostial RCA (nondominant) and diagonal disease. Since he was asymptomatic medical therapy was recommended.   He did have Covid 19 infection in January 2021. He was admitted to [facility] with hypoxia and bilateral PNA.  He brings a diary of his BP readings and his BP has been excellent. He notes that since his Covid infection his energy is not as good and he has more dyspnea on exertion. He had persistent DOE and some arm pain.   He was evaluated with a Myoview study which was high risk with EF 25% and multiple perfusion abnormalities. This led to a cardiac cath which showed patent stents in the dominant LCx. There was critical ostial LAD disease with heavy calcification. Fortunately right heart and LV filling pressures were ok. Patient returned for complex PCI with atherectomy on 11/04/20. He underwent  PCI involving atherectomy and stenting of the ostial LAD with DES. An excellent result was obtained.  Wilder Glade was held due to recurrent UTI. A1c was 6.6%. He was continued on ARB ( unable to afford Entresto ). and Toprol XL and aldactone were added. With revascularization and medical therapy there was significant improvement in LV function with EF up to 45-50%.   He was admitted in January 2023 with lower GI bleed. Colonoscopy showed  diverticuli. Upper EGD showed mild erosive gastritis. Hgb dropped to 10.3. managed medically. Plavix initially held but resumed at DC. BP meds reduced due to hypotension (losartan and aldactone). Some NSVT noted on monitor but asymptomatic. On follow up BP was high and losartan and aldactone resumed.  He was admitted in August 2023 with lower GI bleed felt to be diverticular. Since he had pan EGD in Jan no further work up needed.   On follow up today he denies any chest pain. Rare dyspnea. Is back going to the gym and riding the bike for 30 minutes. Minimal swelling in feet. BP readings at home have been consistently high but he has normal reading today. No bleeding.      Past Medical History:  Diagnosis Date   Coronary artery disease    Hyperlipidemia    Hypertension    MI, old    OA (osteoarthritis)    Pneumonia    hx of 2013     Past Surgical History:  Procedure Laterality Date   BIOPSY  08/04/2021   Procedure: BIOPSY;  Surgeon: Irene Shipper, MD;  Location: [facility] ENDOSCOPY;  Service: Endoscopy;;   CARDIAC CATHETERIZATION  01/26/1999   EF 55%   CARDIAC CATHETERIZATION N/A 05/06/2015   Procedure: Left Heart Cath and Coronary Angiography;  Surgeon: John Alers Gamble [name], MD;  Location: [place] [facility];  Service: Cardiovascular;  Laterality: N/A;   CARDIOVASCULAR STRESS  TEST  02/22/2010   EF 61%   COLONOSCOPY WITH PROPOFOL N/A 08/04/2021   Procedure: COLONOSCOPY WITH PROPOFOL;  Surgeon: Irene Shipper, MD;  Location: [facility] ENDOSCOPY;  Service: Endoscopy;  Laterality: N/A;   corneal implant     left   CORONARY ATHERECTOMY N/A 11/04/2020   Procedure: CORONARY ATHERECTOMY;  Surgeon: Gamble, Daveah Varone M, MD;  Location: [place] [facility];  Service: Cardiovascular;  Laterality: N/A;   CORONARY STENT INTERVENTION N/A 11/04/2020   Procedure: CORONARY STENT INTERVENTION;  Surgeon: Gamble, Chaden Doom M, MD;  Location: [place] [facility];  Service: Cardiovascular;  Laterality: N/A;   CORONARY STENT PLACEMENT   2000   BMS LEFT CIRCUMFLEX CORONARY   ESOPHAGOGASTRODUODENOSCOPY N/A 08/04/2021   Procedure: ESOPHAGOGASTRODUODENOSCOPY (EGD);  Surgeon: Irene Shipper, MD;  Location: [facility] ENDOSCOPY;  Service: Endoscopy;  Laterality: N/A;   INGUINAL HERNIA REPAIR     INTRAVASCULAR ULTRASOUND/IVUS N/A 11/04/2020   Procedure: Intravascular Ultrasound/IVUS;  Surgeon: Gamble, Hermila Millis M, MD;  Location: [place] [facility];  Service: Cardiovascular;  Laterality: N/A;   REPLACEMENT TOTAL KNEE     RIGHT/LEFT HEART CATH AND CORONARY ANGIOGRAPHY N/A 10/27/2020   Procedure: RIGHT/LEFT HEART CATH AND CORONARY ANGIOGRAPHY;  Surgeon: Gamble, Kolina Kube M, MD;  Location: [place] [facility];  Service: Cardiovascular;  Laterality: N/A;   TOTAL KNEE ARTHROPLASTY Left 09/19/2014   Procedure: LEFT TOTAL KNEE ARTHROPLASTY;  Surgeon: John Cabal, MD;  Location: [facility] ORS;  Service: Orthopedics;  Laterality: Left;     Current Outpatient Medications  Medication Sig Dispense Refill   acyclovir (ZOVIRAX) 800 MG tablet Take 800 mg by mouth daily.      aspirin EC 81 MG tablet Take 1 tablet (81 mg total) by mouth daily. 30 tablet 11   dorzolamide (TRUSOPT) 2 % ophthalmic solution Place 1 drop into both eyes 2 (two) times daily.     finasteride (PROSCAR) 5 MG tablet Take 1 tablet (5 mg total) by mouth daily.     Glucosamine-Chondroitin 250-200 MG TABS Take 2 tablets by mouth 2 (two) times daily.     glucose blood (ONETOUCH ULTRA) test strip USE 1 STRIP DAILY.     ketoconazole (NIZORAL) 2 % cream Apply 1 Application topically daily. 15 g 0   ketoconazole (NIZORAL) 2 % cream Apply 1 Application topically daily. 15 g 0   Lancets (ONETOUCH DELICA PLUS 123XX123) MISC SMARTSIG:1 Unit(s) Topical Daily     losartan (COZAAR) 50 MG tablet Take 1 tablet (50 mg total) by mouth daily. 90 tablet 3   metoprolol succinate (TOPROL-XL) 25 MG 24 hr tablet TAKE 1 TABLET (25 MG TOTAL) BY MOUTH DAILY. 90 tablet 3   Multiple Vitamin (MULTIVITAMIN WITH MINERALS) TABS  tablet Take 1 tablet by mouth daily. Centrum Silver     Omega-3 Fatty Acids (FISH OIL) 1200 MG CAPS Take 1,200 mg by mouth daily.     ONETOUCH ULTRA test strip daily.     pantoprazole (PROTONIX) 40 MG tablet Take 1 tablet (40 mg total) by mouth daily at 6 (six) AM. 30 tablet 0   polyethylene glycol (MIRALAX / GLYCOLAX) packet Take 17 g by mouth daily as needed for mild constipation. Mix with 8 oz liquid and drink     prednisoLONE acetate (PRED FORTE) 1 % ophthalmic suspension Place 1 drop into the left eye at bedtime.     rosuvastatin (CRESTOR) 20 MG tablet Take 20 mg by mouth daily.     silodosin (RAPAFLO) 8 MG CAPS capsule Take 8 mg  by mouth daily.     spironolactone (ALDACTONE) 25 MG tablet Take 0.5 tablets (12.5 mg total) by mouth daily. 45 tablet 3   Zinc 50 MG CAPS Take 50 mg by mouth daily with supper.      No current facility-administered medications for this visit.    Allergies:   Atorvastatin, Oysters [shellfish allergy], and Sulfa antibiotics    Social History:  The patient  reports that he quit smoking about 33 years ago. His smoking use included cigarettes. He has a 25.00 pack-year smoking history. He has never used smokeless tobacco. He reports that he does not drink alcohol and does not use drugs.   Family History:  The patient's family history includes Cancer in his father and mother.    ROS:  Please see the history of present illness.   Otherwise, review of systems are positive for none.   All other systems are reviewed and negative.    PHYSICAL EXAM: VS:  BP 122/62 (BP Location: Right Arm, Patient Position: Sitting, Cuff Size: Normal)   Pulse (!) 51   Ht 6' (1.829 Gamble)   Wt 189 lb 12.8 oz (86.1 kg)   SpO2 96%   BMI 25.74 kg/Gamble  , BMI Body mass index is 25.74 kg/Gamble. GENERAL:  Well appearing WM in NAD HEENT:  PERRL, EOMI, sclera are clear. Oropharynx is clear. NECK:  No jugular venous distention, carotid upstroke brisk and symmetric, no bruits, no thyromegaly or  adenopathy LUNGS:  Clear to auscultation bilaterally CHEST:  Unremarkable HEART:  RRR with frequent extrasystoles,  PMI not displaced or sustained,S1 and S2 within normal limits, no S3, no S4: no clicks, no rubs, no murmurs ABD:  Soft, nontender. BS +, no masses or bruits. No hepatomegaly, no splenomegaly EXT:  2 + pulses throughout, no edema, no cyanosis no clubbing. Radial site without hematoma.  SKIN:  Warm and dry.  No rashes NEURO:  Alert and oriented x 3. Cranial nerves II through XII intact. PSYCH:  Cognitively intact  EKG:  EKG is not  ordered today.     Recent Labs: 03/22/2022: Magnesium 1.9 03/28/2022: ALT 20; B Natriuretic Peptide 256.4; BUN 12; Creatinine, Ser 1.11; Hemoglobin 12.7; Platelets 225; Potassium 3.7; Sodium 133    Lipid Panel    Component Value Date/Time   CHOL 91 11/05/2020 0243   TRIG 80 11/05/2020 0243   HDL 35 (L) 11/05/2020 0243   CHOLHDL 2.6 11/05/2020 0243   VLDL 16 11/05/2020 0243   LDLCALC 40 11/05/2020 0243      Wt Readings from Last 3 Encounters:  09/26/22 189 lb 12.8 oz (86.1 kg)  03/28/22 178 lb 2.1 oz (80.8 kg)  03/22/22 178 lb 2.1 oz (80.8 kg)      Other studies Reviewed: Additional studies/ records that were reviewed today include:  November 2017. Cholesterol 164, triglycerides 300, HDL 32 LDL 83. A1c 6.7%. Dated 12/28/16: glucose 150, A1c 7%. Cholesterol 94, triglyderdes 198, HDL 30, LDL 38. Other chemistries normal.  Dated 07/06/17. Glucose 155, otherwise CMET normal. Cholesterol 109, triglycerides 164, HDL 35, LDL 59.  A1c 6.5%. Dated 07/17/18: cholesterol 108, triglycerides 167, HDL 38, LDL 55. Glucose 142. Otherwise CMET normal. A1c 6.7% Dated 07/22/19: cholesterol 103, triglycerides 187, HDL 36, LDL 53.  Dated 08/17/19: glucose 147. Otherwise CMET normal. Hgb 13.9.  Dated 01/23/20: Glucose 127. Otherwise CMET normal. Dated 07/27/20: glucose 165, otherwise CMET normal. Cholesterol 108, triglycerides 155, HDL 35, LDL 54. A1c 6.6%.     Myoview 10/21/20: Study Highlights  Nuclear stress EF: 22%. The left ventricular ejection fraction is severely decreased (<30%). Defect 1: There is a defect present in the mid anterior, mid anteroseptal, mid inferolateral, mid anterolateral, apical anterior, apical inferior, apical lateral and apex location. Findings consistent with prior large anterior apical myocardial infarction. This is a high risk study. There is no evidence of ischemia . He has had a previous anterior apical MI   Cardiac cath 10/27/20:  RIGHT/LEFT HEART CATH AND CORONARY ANGIOGRAPHY    Conclusion    Ost LAD to Prox LAD lesion is 95% stenosed. 1st Diag lesion is 70% stenosed. Previously placed Ost Cx to Prox Cx stent (unknown type) is widely patent. Previously placed LPDA stent (unknown type) is widely patent. Ost RCA to Prox RCA lesion is 90% stenosed. There is severe left ventricular systolic dysfunction. LV end diastolic pressure is normal. The left ventricular ejection fraction is 25-35% by visual estimate.   1. Severe 2 vessel obstructive CAD.    - 90% ostial LAD. Severely calcified. 70% first diagonal    - patent stents in the proximal LCx and left PDA    - 90% ostial nondominant RCA 2. Severe LV dysfunction with regional wall motion abnormalities. 3. Normal LV filling pressures 4. Normal right heart pressures. 5. Preserved cardiac output.    Plan: recommend staged complex  PCI of the ostial LAD with atherectomy and stenting. The diagonal and RCA disease are old and involve small vessels. While CABG would be an option given his advanced age I would favor a percutaneous approach.    PCI 11/04/20: Procedures  CORONARY STENT INTERVENTION  CORONARY ATHERECTOMY  Intravascular Ultrasound/IVUS    Conclusion    Ost LAD to Prox LAD lesion is 95% stenosed. A drug-eluting stent was successfully placed using a SYNERGY XD 3.50X16. Post intervention, there is a 0% residual stenosis.   1. Successful PCI  of the ostial LAD with IVUS guidance, orbital atherectomy and DES x 1   Plan: DAPT for at least 6 months. Will observe overnight and anticipate DC in am.     Recommendations  Antiplatelet/Anticoag Recommend uninterrupted dual antiplatelet therapy with Aspirin '81mg'$  daily and Clopidogrel '75mg'$  daily for a minimum of 6 months (stable ischemic heart disease-Class I recommendation   Coronary Diagrams   Diagnostic Dominance: Left    Intervention     Implants      Echo 11/05/20: IMPRESSIONS     1. Diffuse hypokinesis worse in inferior base with abnormal septal motion  . Left ventricular ejection fraction, by estimation, is 25 to 30%. The  left ventricle has severely decreased function. The left ventricle  demonstrates global hypokinesis. The left   ventricular internal cavity size was moderately dilated. There is mild  left ventricular hypertrophy. Left ventricular diastolic parameters are  indeterminate.   2. Right ventricular systolic function is normal. The right ventricular  size is normal. There is normal pulmonary artery systolic pressure.   3. The mitral valve is normal in structure. Mild mitral valve  regurgitation. No evidence of mitral stenosis.   4. The aortic valve is tricuspid. Aortic valve regurgitation is trivial.  No aortic stenosis is present.   5. The inferior vena cava is dilated in size with >50% respiratory  variability, suggesting right atrial pressure of 8 mmHg.    Echo 01/19/21: IMPRESSIONS     1. Left ventricular ejection fraction, by estimation, is 45 to 50%. The  left ventricle has mildly decreased function. The left ventricle  demonstrates regional wall motion abnormalities (  see scoring  diagram/findings for description). Left ventricular  diastolic parameters are consistent with Grade I diastolic dysfunction  (impaired relaxation). There is severe akinesis of the left ventricular,  basal inferior wall.   2. Right ventricular systolic function is  normal. The right ventricular  size is normal. There is normal pulmonary artery systolic pressure.   3. Right atrial size was mildly dilated.   4. The mitral valve is normal in structure. Trivial mitral valve  regurgitation.   5. The aortic valve is tricuspid. Aortic valve regurgitation is mild.   6. There is dilatation of the ascending aorta.   ASSESSMENT AND PLAN:  1. Coronary disease status post remote stenting of the left circumflex in 2000 with a bare-metal stent. Cath  in 2016 showed  Patent stents in LCx. New disease in diagonal and ostial nondominant RCA  that correlate with stress test results. In 2022  had High risk Myoview study. EF down to 25-30%. Cardiac cath demonstrated critical ostial LAD stenosis. Now s/p successful PCI with atherectomy and DES in April 2022.  Fortunately LV function improved significantly with revascularization. Continue ASA and metoprolol  2. Acute on chronic systolic CHF secondary to ischemic CM. S/p revascularization as noted. Will continue Toprol XL to 25 mg daily. continue Aldactone to 12.5 mg daily. Increase  previously unable to afford Entresto. Continue  losartan to 50 mg daily. BP is well controlled today.  Would avoid SGLT 2 inhibitor given hx of bladder yeast infection. Repeat Echo showed significant improvement in EF to 45-50%.   3. Hypertension- well controlled today.   4. Hyperlipidemia- on Crestor. Labs done in Dec with Dr Garlon Hatchet.   5. Diabetes mellitus.  Per Dr. Garlon Hatchet. On metformin  6. UTI with obstructive uropathy and hematuria. No active symptoms   7. S/p diverticular bleed. Resolved.      Disposition:   FU with me in 6 months   Signed, Carl Bleecker [name], MD  09/26/2022 10:41 AM    [facility] [address], [place], [place]  [address] Phone: [phone]; Fax: [phone]

## 2022-09-26 ENCOUNTER — Encounter: Payer: Self-pay | Admitting: Cardiology

## 2022-09-26 ENCOUNTER — Ambulatory Visit: Payer: Medicare Other | Attending: Cardiology | Admitting: Cardiology

## 2022-09-26 VITALS — BP 122/62 | HR 51 | Ht 72.0 in | Wt 189.8 lb

## 2022-09-26 DIAGNOSIS — E782 Mixed hyperlipidemia: Secondary | ICD-10-CM

## 2022-09-26 DIAGNOSIS — I10 Essential (primary) hypertension: Secondary | ICD-10-CM | POA: Diagnosis not present

## 2022-09-26 DIAGNOSIS — I2510 Atherosclerotic heart disease of native coronary artery without angina pectoris: Secondary | ICD-10-CM

## 2022-09-26 DIAGNOSIS — I5022 Chronic systolic (congestive) heart failure: Secondary | ICD-10-CM | POA: Diagnosis not present

## 2022-09-26 NOTE — Patient Instructions (Signed)
Medication Instructions:  Your physician recommends that you continue on your current medications as directed. Please refer to the Current Medication list given to you today.  *If you need a refill on your cardiac medications before your next appointment, please call your pharmacy*   Follow-Up: At [facility], you and your health needs are our priority.  As part of our continuing mission to provide you with exceptional heart care, we have created designated Provider Care Teams.  These Care Teams include your primary Cardiologist (physician) and Advanced Practice Providers (APPs -  Physician Assistants and Nurse Practitioners) who all work together to provide you with the care you need, when you need it.  We recommend signing up for the patient portal called "MyChart".  Sign up information is provided on this After Visit Summary.  MyChart is used to connect with patients for Virtual Visits (Telemedicine).  Patients are able to view lab/test results, encounter notes, upcoming appointments, etc.  Non-urgent messages can be sent to your provider as well.   To learn more about what you can do with MyChart, go to [email or web address].    Your next appointment:   6 month(s)  Provider:   Peter [name], MD

## 2022-11-01 ENCOUNTER — Ambulatory Visit: Payer: Medicare Other | Admitting: Podiatry

## 2022-11-01 DIAGNOSIS — L84 Corns and callosities: Secondary | ICD-10-CM

## 2022-11-01 DIAGNOSIS — B351 Tinea unguium: Secondary | ICD-10-CM

## 2022-11-01 DIAGNOSIS — G629 Polyneuropathy, unspecified: Secondary | ICD-10-CM | POA: Diagnosis not present

## 2022-11-01 DIAGNOSIS — M79675 Pain in left toe(s): Secondary | ICD-10-CM

## 2022-11-01 DIAGNOSIS — L97511 Non-pressure chronic ulcer of other part of right foot limited to breakdown of skin: Secondary | ICD-10-CM

## 2022-11-01 DIAGNOSIS — B353 Tinea pedis: Secondary | ICD-10-CM

## 2022-11-01 DIAGNOSIS — M79674 Pain in right toe(s): Secondary | ICD-10-CM | POA: Diagnosis not present

## 2022-11-01 DIAGNOSIS — T148XXA Other injury of unspecified body region, initial encounter: Secondary | ICD-10-CM

## 2022-11-01 MED ORDER — KETOCONAZOLE 2 % EX CREA: 1.0000 | TOPICAL_CREAM | Freq: Every day | CUTANEOUS | 0 refills | Status: DC

## 2022-11-01 NOTE — Progress Notes (Signed)
Subjective:  Patient ID: John Gamble, male    DOB: 06-26-1931,  MRN: [id number]  Chief Complaint  Patient presents with   Nail Problem    Patient came in today for routine foot care, nail trim, callus on the forefoot right,     89 y.o. male presents with the above complaint. History confirmed with patient. Patient presenting with pain related to dystrophic thickened elongated nails. Patient is unable to trim own nails related to nail dystrophy and/or mobility issues. Patient does not have a history of T2DM. Patient doest have callus present located at the bottom of the right foot, distal right 3rd toe, left 2nd toe causing pain.  He is most concerned about an area on the bottom of the right foot forefoot that has had some discoloration and's worsening pain.  Objective:  Physical Exam: warm, good capillary refill nail exam onychomycosis of the toenails, onycholysis, and dystrophic nails. Peeling dry skin bilateral foot, mild erythematous rash.  Attention directed the central plantar right forefoot there is noted to be hyperkeratotic lesion with dark discoloration underlying consistent with venous congealed blood.  Upon debridement there was some expression of congealed hematoma fluid no surrounding erythema or edema.  Superficial ulceration to the level of breakdown of skin measures approximately 1 cm x 0.3 cm x 0.1 cm postdebridement DP pulses palpable, PT pulses palpable, and protective sensation diminished to forefoot.  Left Foot:  Pain with palpation of nails due to elongation and dystrophic growth. Hyperkeratotic lesion distal tuft of 2nd toe left foot.  Right Foot: Pain with palpation of nails due to elongation and dystrophic growth. Hyperkeratotic lesion sub 3rd met head right foot and distal right 3rd toe.  Assessment:   1. Pain due to onychomycosis of toenails of both feet   2. Pre-ulcerative calluses   3. Blood blister   4. Neuropathy   5. Tinea pedis of both feet       Plan:   Patient was evaluated and treated and all questions answered.  #tinea pedis bilateral Discussed the etiology and treatment options for tinea pedis.  Discussed topical and oral treatment.  Recommended topical treatment with 2% ketoconazole cream.  This was sent to the patient's pharmacy.  Also discussed appropriate foot hygiene, use of antifungal spray such as Tinactin in shoes, as well as cleaning her foot surfaces such as showers and bathroom floors with bleach.   #Hyperkeratotic lesions/pre ulcerative calluses present sub 3rd met head right foot, distal left 2nd toe and distal right 3rd toe # Blood blister underlying right plantar forefoot subthird met head All symptomatic hyperkeratoses x 3 separate lesions were safely debrided with a sterile #10 blade to patient's level of comfort without incident. We discussed preventative and palliative care of these lesions including supportive and accommodative shoegear, padding, prefabricated and custom molded accommodative orthoses, use of a pumice stone and lotions/creams daily. -The right foot forefoot lesion had a small superficial area of ulceration and blood blister that had developed underneath -Debrided lightly with a 15 blade without complication bandage was applied with removal of hyperkeratotic necrotic tissue and hematoma fluid Procedure: Excisional Debridement of Wound Rationale: Removal of non-viable soft tissue from the wound to promote healing.  Anesthesia: none Post-Debridement Wound Measurements: 1 cm x 03 cm x 0.1 cm  Type of Debridement: Sharp Excisional Tissue Removed: Non-viable soft tissue Depth of Debridement: skin Technique: Sharp excisional debridement to bleeding, viable wound base.  Dressing: Dry, sterile, compression dressing. Disposition: Patient tolerated procedure well.  -Recommend daily  dressing changes with Betadine which was provided the patient and Band-Aid -Will have patient come back in 3 weeks for recheck of  this area   #Onychomycosis with pain  -Nails palliatively debrided as below. -Educated on self-care  Procedure: Nail Debridement Rationale: Pain Type of Debridement: manual, sharp debridement. Instrumentation: Nail nipper, rotary burr. Number of Nails: 10  Return in about 3 weeks (around 11/22/2022).         Everitt Amber, DPM [facility]

## 2022-11-01 NOTE — Progress Notes (Signed)
Subjective:  Patient ID: John Gamble, male    DOB: 27-Aug-1930,  MRN: [id number]  Chief Complaint  Patient presents with   Nail Problem    Patient came in today for routine foot care, nail trim, callus on the forefoot right,     89 y.o. male presents with the above complaint. History confirmed with patient. Patient presenting with pain related to dystrophic thickened elongated nails. Patient is unable to trim own nails related to nail dystrophy and/or mobility issues. Patient does not have a history of T2DM. Patient doest have callus present located at the bottom of the right foot, distal right 3rd toe, left 2nd toe causing pain.  He is most concerned about an area on the bottom of the right foot forefoot that has had some discoloration and's worsening pain.  Objective:  Physical Exam: warm, good capillary refill nail exam onychomycosis of the toenails, onycholysis, and dystrophic nails. Peeling dry skin bilateral foot, mild erythematous rash.  Attention directed the central plantar right forefoot there is noted to be hyperkeratotic lesion with dark discoloration underlying consistent with venous congealed blood.  Upon debridement there was some expression of congealed hematoma fluid no surrounding erythema or edema.  Superficial ulceration to the level of breakdown of skin measures approximately 1 cm x 0.3 cm x 0.1 cm postdebridement DP pulses palpable, PT pulses palpable, and protective sensation diminished to forefoot.  Left Foot:  Pain with palpation of nails due to elongation and dystrophic growth. Hyperkeratotic lesion distal tuft of 2nd toe left foot.  Right Foot: Pain with palpation of nails due to elongation and dystrophic growth. Hyperkeratotic lesion sub 3rd met head right foot and distal right 3rd toe.  Assessment:   1. Pain due to onychomycosis of toenails of both feet   2. Pre-ulcerative calluses   3. Blood blister   4. Neuropathy   5. Tinea pedis of both feet       Plan:   Patient was evaluated and treated and all questions answered.  #tinea pedis bilateral Discussed the etiology and treatment options for tinea pedis.  Discussed topical and oral treatment.  Recommended topical treatment with 2% ketoconazole cream.  This was sent to the patient's pharmacy.  Also discussed appropriate foot hygiene, use of antifungal spray such as Tinactin in shoes, as well as cleaning her foot surfaces such as showers and bathroom floors with bleach.   #Hyperkeratotic lesions/pre ulcerative calluses present sub 3rd met head right foot, distal left 2nd toe and distal right 3rd toe # Blood blister underlying right plantar forefoot subthird met head All symptomatic hyperkeratoses x 3 separate lesions were safely debrided with a sterile #10 blade to patient's level of comfort without incident. We discussed preventative and palliative care of these lesions including supportive and accommodative shoegear, padding, prefabricated and custom molded accommodative orthoses, use of a pumice stone and lotions/creams daily. -The right foot forefoot lesion had a small superficial area of ulceration and blood blister that had developed underneath -Debrided lightly with a 15 blade without complication bandage was applied with removal of hyperkeratotic necrotic tissue and hematoma fluid Procedure: Excisional Debridement of Wound Rationale: Removal of non-viable soft tissue from the wound to promote healing.  Anesthesia: none Post-Debridement Wound Measurements: 1 cm x 03 cm x 0.1 cm  Type of Debridement: Sharp Excisional Tissue Removed: Non-viable soft tissue Depth of Debridement: skin Technique: Sharp excisional debridement to bleeding, viable wound base.  Dressing: Dry, sterile, compression dressing. Disposition: Patient tolerated procedure well.  -Recommend daily  dressing changes with Betadine which was provided the patient and Band-Aid -Will have patient come back in 3 weeks for recheck of  this area   #Onychomycosis with pain  -Nails palliatively debrided as below. -Educated on self-care  Procedure: Nail Debridement Rationale: Pain Type of Debridement: manual, sharp debridement. Instrumentation: Nail nipper, rotary burr. Number of Nails: 10  Return in about 3 weeks (around 11/22/2022).         Everitt Amber, DPM [facility]

## 2022-11-21 ENCOUNTER — Ambulatory Visit: Payer: Medicare Other | Admitting: Podiatry

## 2022-11-22 ENCOUNTER — Ambulatory Visit: Payer: Medicare Other | Admitting: Podiatry

## 2022-11-22 DIAGNOSIS — M79675 Pain in left toe(s): Secondary | ICD-10-CM | POA: Diagnosis not present

## 2022-11-22 DIAGNOSIS — B351 Tinea unguium: Secondary | ICD-10-CM | POA: Diagnosis not present

## 2022-11-22 DIAGNOSIS — L84 Corns and callosities: Secondary | ICD-10-CM | POA: Diagnosis not present

## 2022-11-22 DIAGNOSIS — M79674 Pain in right toe(s): Secondary | ICD-10-CM

## 2022-11-22 DIAGNOSIS — G629 Polyneuropathy, unspecified: Secondary | ICD-10-CM

## 2022-11-22 DIAGNOSIS — T148XXA Other injury of unspecified body region, initial encounter: Secondary | ICD-10-CM

## 2022-11-22 MED ORDER — KETOCONAZOLE 2 % EX CREA: 1.0000 | TOPICAL_CREAM | Freq: Every day | CUTANEOUS | 0 refills | Status: DC

## 2022-11-22 NOTE — Progress Notes (Signed)
  Subjective:  Patient ID: John Gamble, male    DOB: 11-01-1930,  MRN: [id number]  Chief Complaint  Patient presents with   Wound Check    Follow up from wound on right foot, no pain today, patient presents a new problem today regarding left foot, states that the 3rd digit is painful. Patient also stated that the right 3rd digit is painful as well as the wound on the bottom of foot    89 y.o. male presents with the above complaint. History confirmed with patient. Patient presenting for follow-up of small ulceration on the plantar right forefoot.  Also previously had some calluses on the toes that are giving him pain third toe on the left foot.  He has been putting Betadine on all of the callused areas.  As well as the wound on the right plantar forefoot.  Denies any drainage or redness.  Objective:  Physical Exam: warm, good capillary refill nail exam onychomycosis of the toenails, onycholysis, and dystrophic nails. Peeling dry skin bilateral foot, mild erythematous rash.  Attention directed the central plantar right forefoot there is noted to be hyperkeratotic lesion with improved appearance from prior.  There is no open wound at this time.  Is healed and with no erythema drainage or sinus tract  DP pulses palpable, PT pulses palpable, and protective sensation diminished to forefoot.  Left Foot:  Pain with palpation of nails due to elongation and dystrophic growth. Hyperkeratotic lesion distal tuft of 2nd toe left foot.  Right Foot: Pain with palpation of nails due to elongation and dystrophic growth. Hyperkeratotic lesion sub 3rd met head right foot and distal right 3rd toe.  Assessment:   1. Pain due to onychomycosis of toenails of both feet   2. Pre-ulcerative calluses   3. Blood blister   4. Neuropathy        Plan:  Patient was evaluated and treated and all questions answered.  #tinea pedis bilateral -Improved with ketoconazole lotion continue as needed Discussed the etiology  and treatment options for tinea pedis.  Discussed topical and oral treatment.  Recommended topical treatment with 2% ketoconazole cream.  This was sent to the patient's pharmacy.  Also discussed appropriate foot hygiene, use of antifungal spray such as Tinactin in shoes, as well as cleaning her foot surfaces such as showers and bathroom floors with bleach.   #Hyperkeratotic lesions/pre ulcerative calluses present sub 3rd met head right foot, distal left 2nd toe and distal right 3rd toe # Blood blister underlying right plantar forefoot subthird met head -Wound on the right plantar forefoot is much improved with Betadine and wound care -Continue to monitor for worsening of the wound offload the area -No further wound care is needed -Patient will follow-up in 9 weeks for recheck and routine care  Return in about 9 weeks (around 01/24/2023) for RFC.         Corinna Gab, DPM [facility] / [facility]

## 2022-12-18 ENCOUNTER — Other Ambulatory Visit: Payer: Self-pay

## 2022-12-18 ENCOUNTER — Inpatient Hospital Stay (HOSPITAL_COMMUNITY)
Admission: EM | Admit: 2022-12-18 | Discharge: 2022-12-21 | DRG: 871 | Disposition: A | Discharge status: Home / Self Care | Payer: Medicare Other | Attending: Internal Medicine | Admitting: Internal Medicine

## 2022-12-18 ENCOUNTER — Encounter (HOSPITAL_COMMUNITY): Payer: Self-pay | Admitting: Emergency Medicine

## 2022-12-18 DIAGNOSIS — Z91013 Allergy to seafood: Secondary | ICD-10-CM

## 2022-12-18 DIAGNOSIS — I2510 Atherosclerotic heart disease of native coronary artery without angina pectoris: Secondary | ICD-10-CM

## 2022-12-18 DIAGNOSIS — Z7984 Long term (current) use of oral hypoglycemic drugs: Secondary | ICD-10-CM

## 2022-12-18 DIAGNOSIS — Z87891 Personal history of nicotine dependence: Secondary | ICD-10-CM

## 2022-12-18 DIAGNOSIS — I110 Hypertensive heart disease with heart failure: Secondary | ICD-10-CM | POA: Diagnosis present

## 2022-12-18 DIAGNOSIS — Z955 Presence of coronary angioplasty implant and graft: Secondary | ICD-10-CM

## 2022-12-18 DIAGNOSIS — K922 Gastrointestinal hemorrhage, unspecified: Principal | ICD-10-CM

## 2022-12-18 DIAGNOSIS — Z96652 Presence of left artificial knee joint: Secondary | ICD-10-CM | POA: Diagnosis present

## 2022-12-18 DIAGNOSIS — E785 Hyperlipidemia, unspecified: Secondary | ICD-10-CM | POA: Diagnosis present

## 2022-12-18 DIAGNOSIS — K5792 Diverticulitis of intestine, part unspecified, without perforation or abscess without bleeding: Secondary | ICD-10-CM

## 2022-12-18 DIAGNOSIS — K625 Hemorrhage of anus and rectum: Secondary | ICD-10-CM | POA: Diagnosis present

## 2022-12-18 DIAGNOSIS — Z7982 Long term (current) use of aspirin: Secondary | ICD-10-CM

## 2022-12-18 DIAGNOSIS — I252 Old myocardial infarction: Secondary | ICD-10-CM

## 2022-12-18 DIAGNOSIS — Z888 Allergy status to other drugs, medicaments and biological substances status: Secondary | ICD-10-CM

## 2022-12-18 DIAGNOSIS — Z79899 Other long term (current) drug therapy: Secondary | ICD-10-CM

## 2022-12-18 DIAGNOSIS — D62 Acute posthemorrhagic anemia: Secondary | ICD-10-CM | POA: Diagnosis present

## 2022-12-18 DIAGNOSIS — I5042 Chronic combined systolic (congestive) and diastolic (congestive) heart failure: Secondary | ICD-10-CM

## 2022-12-18 DIAGNOSIS — Z882 Allergy status to sulfonamides status: Secondary | ICD-10-CM

## 2022-12-18 DIAGNOSIS — K5733 Diverticulitis of large intestine without perforation or abscess with bleeding: Principal | ICD-10-CM | POA: Diagnosis present

## 2022-12-18 DIAGNOSIS — E872 Acidosis, unspecified: Secondary | ICD-10-CM | POA: Diagnosis present

## 2022-12-18 DIAGNOSIS — K5900 Constipation, unspecified: Secondary | ICD-10-CM | POA: Diagnosis present

## 2022-12-18 DIAGNOSIS — I959 Hypotension, unspecified: Secondary | ICD-10-CM | POA: Diagnosis present

## 2022-12-18 DIAGNOSIS — E1165 Type 2 diabetes mellitus with hyperglycemia: Secondary | ICD-10-CM | POA: Diagnosis present

## 2022-12-18 LAB — CBC
HCT: 40.5 % (ref 39.0–52.0)
Hemoglobin: 12.6 g/dL — ABNORMAL LOW (ref 13.0–17.0)
MCH: 25.4 pg — ABNORMAL LOW (ref 26.0–34.0)
MCHC: 31.1 g/dL (ref 30.0–36.0)
MCV: 81.7 fL (ref 80.0–100.0)
Platelets: 383 10*3/uL (ref 150–400)
RBC: 4.96 MIL/uL (ref 4.22–5.81)
RDW: 17.3 % — ABNORMAL HIGH (ref 11.5–15.5)
WBC: 15.5 10*3/uL — ABNORMAL HIGH (ref 4.0–10.5)
nRBC: 0 % (ref 0.0–0.2)

## 2022-12-18 LAB — TYPE AND SCREEN

## 2022-12-18 NOTE — ED Notes (Signed)
Assumed care of patient who arrived to room via wc from triage stating he was having bright red bloody stool. Pt states this began at 1800 tonight. Pt takes baby asa a day. Pt a/o x 4 respirations even and non labored skin pale cool dry, pt hypotensive

## 2022-12-18 NOTE — ED Triage Notes (Signed)
Pt reports having two episodes of rectal bleeding tonight. Pt states he "almost did not make it to the bathroom... it was bright red. And Filled the commode."  Pt is pale, dizzy and weak.

## 2022-12-18 NOTE — ED Notes (Signed)
RN attempted to start an IV, unsuccessful.  Blood was obtained.

## 2022-12-18 NOTE — ED Notes (Signed)
Unable to get oral temp in triage, attempted 3x.

## 2022-12-19 ENCOUNTER — Emergency Department (HOSPITAL_COMMUNITY): Payer: Medicare Other

## 2022-12-19 ENCOUNTER — Encounter (HOSPITAL_COMMUNITY): Payer: Self-pay | Admitting: Internal Medicine

## 2022-12-19 DIAGNOSIS — Z87891 Personal history of nicotine dependence: Secondary | ICD-10-CM | POA: Diagnosis not present

## 2022-12-19 DIAGNOSIS — E1165 Type 2 diabetes mellitus with hyperglycemia: Secondary | ICD-10-CM | POA: Diagnosis present

## 2022-12-19 DIAGNOSIS — I10 Essential (primary) hypertension: Secondary | ICD-10-CM | POA: Diagnosis not present

## 2022-12-19 DIAGNOSIS — E785 Hyperlipidemia, unspecified: Secondary | ICD-10-CM | POA: Diagnosis present

## 2022-12-19 DIAGNOSIS — I252 Old myocardial infarction: Secondary | ICD-10-CM | POA: Diagnosis not present

## 2022-12-19 DIAGNOSIS — I110 Hypertensive heart disease with heart failure: Secondary | ICD-10-CM | POA: Diagnosis present

## 2022-12-19 DIAGNOSIS — Z96652 Presence of left artificial knee joint: Secondary | ICD-10-CM | POA: Diagnosis present

## 2022-12-19 DIAGNOSIS — K5792 Diverticulitis of intestine, part unspecified, without perforation or abscess without bleeding: Secondary | ICD-10-CM

## 2022-12-19 DIAGNOSIS — Z91013 Allergy to seafood: Secondary | ICD-10-CM | POA: Diagnosis not present

## 2022-12-19 DIAGNOSIS — I509 Heart failure, unspecified: Secondary | ICD-10-CM

## 2022-12-19 DIAGNOSIS — I2510 Atherosclerotic heart disease of native coronary artery without angina pectoris: Secondary | ICD-10-CM | POA: Diagnosis present

## 2022-12-19 DIAGNOSIS — K5900 Constipation, unspecified: Secondary | ICD-10-CM

## 2022-12-19 DIAGNOSIS — I5042 Chronic combined systolic (congestive) and diastolic (congestive) heart failure: Secondary | ICD-10-CM | POA: Diagnosis present

## 2022-12-19 DIAGNOSIS — K625 Hemorrhage of anus and rectum: Secondary | ICD-10-CM | POA: Diagnosis not present

## 2022-12-19 DIAGNOSIS — Z7984 Long term (current) use of oral hypoglycemic drugs: Secondary | ICD-10-CM | POA: Diagnosis not present

## 2022-12-19 DIAGNOSIS — D62 Acute posthemorrhagic anemia: Secondary | ICD-10-CM | POA: Diagnosis present

## 2022-12-19 DIAGNOSIS — K5733 Diverticulitis of large intestine without perforation or abscess with bleeding: Secondary | ICD-10-CM | POA: Diagnosis present

## 2022-12-19 DIAGNOSIS — E119 Type 2 diabetes mellitus without complications: Secondary | ICD-10-CM | POA: Diagnosis not present

## 2022-12-19 DIAGNOSIS — E872 Acidosis, unspecified: Secondary | ICD-10-CM | POA: Diagnosis present

## 2022-12-19 DIAGNOSIS — Z79899 Other long term (current) drug therapy: Secondary | ICD-10-CM | POA: Diagnosis not present

## 2022-12-19 DIAGNOSIS — I959 Hypotension, unspecified: Secondary | ICD-10-CM | POA: Diagnosis present

## 2022-12-19 DIAGNOSIS — Z882 Allergy status to sulfonamides status: Secondary | ICD-10-CM | POA: Diagnosis not present

## 2022-12-19 DIAGNOSIS — Z7982 Long term (current) use of aspirin: Secondary | ICD-10-CM | POA: Diagnosis not present

## 2022-12-19 DIAGNOSIS — Z955 Presence of coronary angioplasty implant and graft: Secondary | ICD-10-CM | POA: Diagnosis not present

## 2022-12-19 DIAGNOSIS — Z888 Allergy status to other drugs, medicaments and biological substances status: Secondary | ICD-10-CM | POA: Diagnosis not present

## 2022-12-19 LAB — PHOSPHORUS: Phosphorus: 3 mg/dL (ref 2.5–4.6)

## 2022-12-19 LAB — COMPREHENSIVE METABOLIC PANEL
ALT: 18 U/L (ref 0–44)
AST: 20 U/L (ref 15–41)
Albumin: 3.5 g/dL (ref 3.5–5.0)
Alkaline Phosphatase: 60 U/L (ref 38–126)
Anion gap: 10 (ref 5–15)
BUN: 18 mg/dL (ref 8–23)
CO2: 18 mmol/L — ABNORMAL LOW (ref 22–32)
Calcium: 8.9 mg/dL (ref 8.9–10.3)
Chloride: 108 mmol/L (ref 98–111)
Creatinine, Ser: 1.16 mg/dL (ref 0.61–1.24)
GFR, Estimated: 60 mL/min — ABNORMAL LOW (ref >60)
Glucose, Bld: 293 mg/dL — ABNORMAL HIGH (ref 70–99)
Potassium: 4.6 mmol/L (ref 3.5–5.1)
Sodium: 136 mmol/L (ref 135–145)
Total Bilirubin: 0.7 mg/dL (ref 0.3–1.2)
Total Protein: 5.9 g/dL — ABNORMAL LOW (ref 6.5–8.1)

## 2022-12-19 LAB — GLUCOSE, CAPILLARY
Glucose-Capillary: 200 mg/dL — ABNORMAL HIGH (ref 70–99)
Glucose-Capillary: 141 mg/dL — ABNORMAL HIGH (ref 70–99)

## 2022-12-19 LAB — MAGNESIUM: Magnesium: 1.9 mg/dL (ref 1.7–2.4)

## 2022-12-19 LAB — CBC
HCT: 33.8 % — ABNORMAL LOW (ref 39.0–52.0)
Hemoglobin: 10.6 g/dL — ABNORMAL LOW (ref 13.0–17.0)
MCH: 25.5 pg — ABNORMAL LOW (ref 26.0–34.0)
MCHC: 31.4 g/dL (ref 30.0–36.0)
MCV: 81.4 fL (ref 80.0–100.0)
Platelets: 271 10*3/uL (ref 150–400)
RBC: 4.15 MIL/uL — ABNORMAL LOW (ref 4.22–5.81)
RDW: 17.1 % — ABNORMAL HIGH (ref 11.5–15.5)
WBC: 10.6 10*3/uL — ABNORMAL HIGH (ref 4.0–10.5)
nRBC: 0 % (ref 0.0–0.2)

## 2022-12-19 LAB — BASIC METABOLIC PANEL
Anion gap: 7 (ref 5–15)
BUN: 17 mg/dL (ref 8–23)
CO2: 21 mmol/L — ABNORMAL LOW (ref 22–32)
Calcium: 8.5 mg/dL — ABNORMAL LOW (ref 8.9–10.3)
Chloride: 107 mmol/L (ref 98–111)
Creatinine, Ser: 0.92 mg/dL (ref 0.61–1.24)
GFR, Estimated: >60 mL/min (ref >60)
Glucose, Bld: 140 mg/dL — ABNORMAL HIGH (ref 70–99)
Potassium: 4.4 mmol/L (ref 3.5–5.1)
Sodium: 135 mmol/L (ref 135–145)

## 2022-12-19 LAB — HEMOGLOBIN AND HEMATOCRIT, BLOOD
HCT: 33.8 % — ABNORMAL LOW (ref 39.0–52.0)
HCT: 31.9 % — ABNORMAL LOW (ref 39.0–52.0)
Hemoglobin: 10.6 g/dL — ABNORMAL LOW (ref 13.0–17.0)
Hemoglobin: 10.2 g/dL — ABNORMAL LOW (ref 13.0–17.0)

## 2022-12-19 LAB — CBG MONITORING, ED
Glucose-Capillary: 127 mg/dL — ABNORMAL HIGH (ref 70–99)
Glucose-Capillary: 133 mg/dL — ABNORMAL HIGH (ref 70–99)

## 2022-12-19 LAB — TYPE AND SCREEN: ABO/RH(D): A POS

## 2022-12-19 LAB — HEMOGLOBIN A1C
Hgb A1c MFr Bld: 6.7 % — ABNORMAL HIGH (ref 4.8–5.6)
Mean Plasma Glucose: 145.59 mg/dL

## 2022-12-19 LAB — LACTIC ACID, PLASMA: Lactic Acid, Venous: 1.6 mmol/L (ref 0.5–1.9)

## 2022-12-19 LAB — POC OCCULT BLOOD, ED: Fecal Occult Bld: POSITIVE — AB

## 2022-12-19 MED ORDER — INSULIN ASPART 100 UNIT/ML IJ SOLN: 0.0000 [IU] | Freq: Every day | INTRAMUSCULAR | Status: DC

## 2022-12-19 MED ORDER — LACTATED RINGERS IV BOLUS
1000.0000 mL | Freq: Once | INTRAVENOUS | Status: AC
  Administered 2022-12-19: 1000 mL via INTRAVENOUS

## 2022-12-19 MED ORDER — PROCHLORPERAZINE EDISYLATE 10 MG/2ML IJ SOLN: 5.0000 mg | Freq: Four times a day (QID) | INTRAMUSCULAR | Status: DC | PRN

## 2022-12-19 MED ORDER — ACETAMINOPHEN 325 MG PO TABS: 650.0000 mg | ORAL_TABLET | Freq: Four times a day (QID) | ORAL | Status: DC | PRN

## 2022-12-19 MED ORDER — SODIUM CHLORIDE 0.9 % IV SOLN
3.0000 g | Freq: Three times a day (TID) | INTRAVENOUS | Status: DC
  Administered 2022-12-19 – 2022-12-21 (×7): 3 g via INTRAVENOUS
  Filled 2022-12-19 (×7): qty 8

## 2022-12-19 MED ORDER — SODIUM CHLORIDE 0.9 % IV SOLN
1.5000 g | Freq: Once | INTRAVENOUS | Status: AC
  Administered 2022-12-19: 1.5 g via INTRAVENOUS
  Filled 2022-12-19: qty 4

## 2022-12-19 MED ORDER — MELATONIN 3 MG PO TABS: 3.0000 mg | ORAL_TABLET | Freq: Every evening | ORAL | Status: DC | PRN

## 2022-12-19 MED ORDER — INSULIN ASPART 100 UNIT/ML IJ SOLN
0.0000 [IU] | Freq: Three times a day (TID) | INTRAMUSCULAR | Status: DC
  Administered 2022-12-19 – 2022-12-20 (×3): 2 [IU] via SUBCUTANEOUS
  Administered 2022-12-20: 1 [IU] via SUBCUTANEOUS
  Administered 2022-12-21: 2 [IU] via SUBCUTANEOUS
  Administered 2022-12-21: 1 [IU] via SUBCUTANEOUS

## 2022-12-19 MED ORDER — IOHEXOL 350 MG/ML SOLN
100.0000 mL | Freq: Once | INTRAVENOUS | Status: AC | PRN
  Administered 2022-12-19: 100 mL via INTRAVENOUS

## 2022-12-19 NOTE — Progress Notes (Signed)
Received as new admission from ED to room [id number]. Vitals measured, room air. Patient ambulates x1 assist. Skin assessed. Orders reviewed, plan of care discussed. Patient is being monitored for rectal bleeding. Patient arrived and had a continent episode of dark red stool in the toilet. Safety measures implemented.

## 2022-12-19 NOTE — Plan of Care (Signed)

## 2022-12-19 NOTE — H&P (Addendum)
History and Physical  John Gamble ZOX:[id number] DOB: [birth year] DOA: 12/18/2022  Referring physician: Dr. Blinda Leatherwood, EDP  PCP: Olive Bass, MD  Outpatient Specialists: GI Patient coming from: Home  Chief Complaint: Rectal bleeding  HPI: John Gamble is a 89 y.o. male with medical history significant for lower GI bleed from diverticular disease, coronary artery disease status post PCI with stenting on low dose ASA , hypertension, hyperlipidemia, type 2 diabetes, history of mild reflux esophagitis, mild erosive gastroduodenitis, seen on EGD done on 08/04/2021.  Who presents to [facility] ED from home due to rectal bleeding, bright red blood per rectum, today.  Associated with peri-umbilical abdominal cramping.  In the ED, initially hypotensive improved with IV fluid bolus.  CTA GI bleed did not show evidence of active bleeding however showed mild acute sigmoid diverticulitis.  Empiric IV antibiotics were initiated in the ED.  The patient was admitted by [facility], hospitalist service, to progressive care unit as inpatient status.  ED Course: Temperature 98.3.  BP 120/67, pulse 51, respiration rate 19 O2 saturation 100% on room air.  Lab studies remarkable for WBC 15.5K, Hg 12.6K.  Review of Systems: Review of systems as noted in the HPI. All other systems reviewed and are negative.   Past Medical History:  Diagnosis Date   Coronary artery disease    Hyperlipidemia    Hypertension    MI, old    OA (osteoarthritis)    Pneumonia    hx of 2013    Past Surgical History:  Procedure Laterality Date   BIOPSY  08/04/2021   Procedure: BIOPSY;  Surgeon: Hilarie Fredrickson, MD;  Location: [facility] ENDOSCOPY;  Service: Endoscopy;;   CARDIAC CATHETERIZATION  01/26/1999   EF 55%   CARDIAC CATHETERIZATION N/A 05/06/2015   Procedure: Left Heart Cath and Coronary Angiography;  Surgeon: Peter M Swaziland, MD;  Location: [facility] INVASIVE CV LAB;  Service: Cardiovascular;  Laterality: N/A;   CARDIOVASCULAR STRESS TEST  02/22/2010    EF 61%   COLONOSCOPY WITH PROPOFOL N/A 08/04/2021   Procedure: COLONOSCOPY WITH PROPOFOL;  Surgeon: Hilarie Fredrickson, MD;  Location: [facility] ENDOSCOPY;  Service: Endoscopy;  Laterality: N/A;   corneal implant     left   CORONARY ATHERECTOMY N/A 11/04/2020   Procedure: CORONARY ATHERECTOMY;  Surgeon: Swaziland, Peter M, MD;  Location: [facility] INVASIVE CV LAB;  Service: Cardiovascular;  Laterality: N/A;   CORONARY STENT INTERVENTION N/A 11/04/2020   Procedure: CORONARY STENT INTERVENTION;  Surgeon: Swaziland, Peter M, MD;  Location: [facility] INVASIVE CV LAB;  Service: Cardiovascular;  Laterality: N/A;   CORONARY STENT PLACEMENT  2000   BMS LEFT CIRCUMFLEX CORONARY   CORONARY ULTRASOUND/IVUS N/A 11/04/2020   Procedure: Intravascular Ultrasound/IVUS;  Surgeon: Swaziland, Peter M, MD;  Location: [facility] INVASIVE CV LAB;  Service: Cardiovascular;  Laterality: N/A;   ESOPHAGOGASTRODUODENOSCOPY N/A 08/04/2021   Procedure: ESOPHAGOGASTRODUODENOSCOPY (EGD);  Surgeon: Hilarie Fredrickson, MD;  Location: [facility] ENDOSCOPY;  Service: Endoscopy;  Laterality: N/A;   INGUINAL HERNIA REPAIR     REPLACEMENT TOTAL KNEE     RIGHT/LEFT HEART CATH AND CORONARY ANGIOGRAPHY N/A 10/27/2020   Procedure: RIGHT/LEFT HEART CATH AND CORONARY ANGIOGRAPHY;  Surgeon: Swaziland, Peter M, MD;  Location: [facility] INVASIVE CV LAB;  Service: Cardiovascular;  Laterality: N/A;   TOTAL KNEE ARTHROPLASTY Left 09/19/2014   Procedure: LEFT TOTAL KNEE ARTHROPLASTY;  Surgeon: Eugenia Mcalpine, MD;  Location: [facility] ORS;  Service: Orthopedics;  Laterality: Left;    Social History:  reports that he quit smoking about  33 years ago. His smoking use included cigarettes. He has a 25.00 pack-year smoking history. He has never used smokeless tobacco. He reports that he does not drink alcohol and does not use drugs.   Allergies  Allergen Reactions   Atorvastatin Other (See Comments)    myalgias   Oysters [Shellfish Allergy] Nausea Only   Sulfa Antibiotics Nausea And Vomiting    Family History   Problem Relation Age of Onset   Cancer Mother    Cancer Father       Prior to Admission medications   Medication Sig Start Date End Date Taking? Authorizing Provider  acyclovir (ZOVIRAX) 800 MG tablet Take 800 mg by mouth daily.  03/03/14   [provider]  aspirin EC 81 MG tablet Take 1 tablet (81 mg total) by mouth daily. 03/28/22   Sabino Dick, DO  dorzolamide (TRUSOPT) 2 % ophthalmic solution Place 1 drop into both eyes 2 (two) times daily.    [provider]  finasteride (PROSCAR) 5 MG tablet Take 1 tablet (5 mg total) by mouth daily. 11/20/20   Swaziland, Peter M, MD  Glucosamine-Chondroitin 250-200 MG TABS Take 2 tablets by mouth 2 (two) times daily.    [provider]  glucose blood (ONETOUCH ULTRA) test strip USE 1 STRIP DAILY. 08/20/20   [provider]  ketoconazole (NIZORAL) 2 % cream Apply 1 Application topically daily. 08/22/22   Standiford, Jenelle Mages, DPM  ketoconazole (NIZORAL) 2 % cream Apply 1 Application topically daily. 08/26/22   Standiford, Jenelle Mages, DPM  ketoconazole (NIZORAL) 2 % cream Apply 1 Application topically daily. 11/01/22   Standiford, Jenelle Mages, DPM  ketoconazole (NIZORAL) 2 % cream Apply 1 Application topically daily. 11/22/22   Standiford, Jenelle Mages, DPM  Lancets (ONETOUCH DELICA PLUS Stewart) MISC SMARTSIG:1 Unit(s) Topical Daily 07/01/20   [provider]  losartan (COZAAR) 50 MG tablet Take 1 tablet (50 mg total) by mouth daily. 03/02/22 02/25/23  Swaziland, Peter M, MD  metoprolol succinate (TOPROL-XL) 25 MG 24 hr tablet TAKE 1 TABLET (25 MG TOTAL) BY MOUTH DAILY. 08/22/22   Swaziland, Peter M, MD  Multiple Vitamin (MULTIVITAMIN WITH MINERALS) TABS tablet Take 1 tablet by mouth daily. Centrum Silver    [provider]  Omega-3 Fatty Acids (FISH OIL) 1200 MG CAPS Take 1,200 mg by mouth daily.    [provider]  [facility] ULTRA test strip daily. 08/20/20   [provider]  pantoprazole  (PROTONIX) 40 MG tablet Take 1 tablet (40 mg total) by mouth daily at 6 (six) AM. 08/06/21   Andrey Campanile, MD  polyethylene glycol (MIRALAX / Ethelene Hal) packet Take 17 g by mouth daily as needed for mild constipation. Mix with 8 oz liquid and drink    [provider]  prednisoLONE acetate (PRED FORTE) 1 % ophthalmic suspension Place 1 drop into the left eye at bedtime. 09/12/20   [provider]  rosuvastatin (CRESTOR) 20 MG tablet Take 20 mg by mouth daily.    [provider]  silodosin (RAPAFLO) 8 MG CAPS capsule Take 8 mg by mouth daily. 12/04/20   [provider]  spironolactone (ALDACTONE) 25 MG tablet Take 0.5 tablets (12.5 mg total) by mouth daily. 01/13/22   Swaziland, Peter M, MD  Zinc 50 MG CAPS Take 50 mg by mouth daily with supper.     [provider]    Physical Exam: BP 120/67   Pulse (!) 51   Temp 98.3 F (36.8 C) (Oral)  Resp 19   Ht 6' (1.829 m)   SpO2 99%   BMI 25.74 kg/m   General: 89 y.o. year-old male well developed well nourished in no acute distress.  Alert and oriented x3. Cardiovascular: Regular rate and rhythm with no rubs or gallops.  No thyromegaly or JVD noted.  Trace lower extremity edema bilaterally. Respiratory: Clear to auscultation with no wheezes or rales. Good inspiratory effort. Abdomen: Soft mild periumbilical tenderness nondistended with normal bowel sounds x4 quadrants. Muskuloskeletal: No cyanosis or clubbing noted bilaterally Neuro: CN II-XII intact, strength, sensation, reflexes Skin: No ulcerative lesions noted or rashes Psychiatry: Judgement and insight appear normal. Mood is appropriate for condition and setting          Labs on Admission:  Basic Metabolic Panel: Recent Labs  Lab 12/18/22 2320  NA 136  K 4.6  CL 108  CO2 18*  GLUCOSE 293*  BUN 18  CREATININE 1.16  CALCIUM 8.9   Liver Function Tests: Recent Labs  Lab 12/18/22 2320  AST 20  ALT 18  ALKPHOS 60  BILITOT 0.7  PROT  5.9*  ALBUMIN 3.5   No results for input(s): "LIPASE", "AMYLASE" in the last 168 hours. No results for input(s): "AMMONIA" in the last 168 hours. CBC: Recent Labs  Lab 12/18/22 2320  WBC 15.5*  HGB 12.6*  HCT 40.5  MCV 81.7  PLT 383   Cardiac Enzymes: No results for input(s): "CKTOTAL", "CKMB", "CKMBINDEX", "TROPONINI" in the last 168 hours.  BNP (last 3 results) Recent Labs    03/21/22 1036 03/28/22 0032  BNP 192.8* 256.4*    ProBNP (last 3 results) No results for input(s): "PROBNP" in the last 8760 hours.  CBG: No results for input(s): "GLUCAP" in the last 168 hours.  Radiological Exams on Admission: CT ANGIO GI BLEED  Result Date: 12/19/2022 CLINICAL DATA:  Gastrointestinal hemorrhage EXAM: CTA ABDOMEN AND PELVIS WITHOUT AND WITH CONTRAST TECHNIQUE: Multidetector CT imaging of the abdomen and pelvis was performed using the standard protocol during bolus administration of intravenous contrast. Multiplanar reconstructed images and MIPs were obtained and reviewed to evaluate the vascular anatomy. RADIATION DOSE REDUCTION: This exam was performed according to the departmental dose-optimization program which includes automated exposure control, adjustment of the mA and/or kV according to patient size and/or use of iterative reconstruction technique. CONTRAST:  OMNIPAQUE IOHEXOL 350 MG/ML SOLN COMPARISON:  03/28/2022 FINDINGS: VASCULAR Aorta: Normal caliber aorta without aneurysm, dissection, vasculitis or significant stenosis. Moderate atherosclerotic calcification Celiac: Patent without evidence of aneurysm, dissection, vasculitis or significant stenosis. SMA: Patent without evidence of aneurysm, dissection, vasculitis or significant stenosis. Renals: Both renal arteries are patent without evidence of aneurysm, dissection, vasculitis, fibromuscular dysplasia or significant stenosis. IMA: Patent without evidence of aneurysm, dissection, vasculitis or significant stenosis.  Inflow: Patent without evidence of aneurysm, dissection, vasculitis or significant stenosis. Proximal Outflow: Bilateral common femoral and visualized portions of the superficial and profunda femoral arteries are patent without evidence of aneurysm, dissection, vasculitis or significant stenosis. Veins: Unremarkable. In particular, the inferior and superior mesenteric veins and portal vein are patent. Review of the MIP images confirms the above findings. NON-VASCULAR Lower chest: Mild bibasilar cylindrical bronchiectasis. Extensive coronary artery calcification. Stable mild cardiomegaly. Hepatobiliary: Tiny cyst within the lateral segment of the left hepatic lobe. Liver otherwise unremarkable. Cholelithiasis without pericholecystic inflammatory change. No intra or extrahepatic biliary ductal dilation. Pancreas: Unremarkable Spleen: Unremarkable Adrenals/Urinary Tract: The adrenal glands are unremarkable. The kidneys are normal in size and position. Scattered cortical hypodensities  are seen within the kidneys bilaterally which are too small to accurately characterize but likely represent multiple tiny cortical cysts. No follow-up imaging is recommended for these lesions. The kidneys are otherwise unremarkable. The bladder is trabeculated and there are innumerable small diverticula identified in keeping with changes of chronic bladder outlet obstruction. The bladder is not distended. Stomach/Bowel: No active gastrointestinal hemorrhage is identified. There is severe descending and sigmoid colonic diverticulosis. There is superimposed focal pericolonic inflammatory change involving the proximal sigmoid colon best seen on axial image # 158/17 in keeping with mild acute uncomplicated sigmoid diverticulitis. There is no evidence of obstruction or perforation. No loculated pericolonic fluid collections are identified. The stomach, small bowel, and large bowel are otherwise unremarkable. Appendix normal. No free  intraperitoneal gas or fluid. Lymphatic: No pathologic adenopathy within the abdomen and pelvis. Reproductive: Marked prostatic enlargement. Other: Small fat containing left inguinal hernia Musculoskeletal: Degenerative changes are seen throughout the lumbar spine. No acute bone abnormality. Osseous structures are diffusely osteopenic. IMPRESSION: 1. No active gastrointestinal hemorrhage identified. 2. Extensive coronary artery calcification. Stable mild cardiomegaly. 3. Cholelithiasis. 4. Mild acute uncomplicated sigmoid diverticulitis. Severe superimposed distal colonic diverticulosis. 5. Marked prostatic enlargement with evidence of chronic bladder outlet obstruction. Aortic Atherosclerosis (ICD10-I70.0). Electronically Signed   By: Helyn Numbers M.D.   On: 12/19/2022 02:19    EKG: I independently viewed the EKG done and my findings are as followed: Sinus rhythm rate of 79.  Frequent PVCs.  QTc 444.  Assessment/Plan Present on Admission:  Rectal bleeding  Principal Problem:   Rectal bleeding  Rectal bleeding, likely from diverticular disease Presented with bright red blood per rectum and positive FOBT Last colonoscopy done on 08/04/2021 showed diverticulosis in the entire examined colon Prior history of diverticular bleed. Hold off home aspirin Serial H&H every 6 hours Transfuse hemoglobin less than 8K  Sepsis 2/2 mild uncomplicated acute sigmoid diverticulitis, POA Leukocytosis 15,000, tachypnea 28, findings on CT scan of mild acute sigmoid diverticulitis. Monitor fever curve and WBC Maintain MAP>65 Peripheral blood cultures x 2, lactic acid level Continue Unasyn started in the ED Colonoscopy recommended in 6-8 weeks.  Coronary artery disease status post PCI with stenting Hold off home aspirin Denies any anginal symptoms  History of mild reflux esophagitis/mild erosive gastroduodenitis seen on EGD done in 08/04/2021 Resume home PPI.  Non anion gap metabolic acidosis Serum bicarb  18 Anion gap 10 Monitor   Type 2 diabetes with hyperglycemia Serum glucose 293 Obtain hemoglobin A1c Start insulin sliding scale.  Chronic combined diastolic and systolic CHF Last 2D echo done on 01/19/2021 revealed LVEF 45 to 50% with grade 1 diastolic dysfunction Start strict I's and O's and daily weight    DVT prophylaxis: SCDs  Code Status: Full code  Family Communication: Updated his wife at bedside  Disposition Plan: Admitted to progressive care unit  Consults called: None  Admission status: Inpatient status.   Status is: Inpatient The patient requires at least 2 midnights for further evaluation and treatment of her condition.   Darlin Drop MD [facility] Pager [phone]  If 7PM-7AM, please contact night-coverage [email or web address] Password [facility]  12/19/2022, 3:29 AM

## 2022-12-19 NOTE — ED Notes (Signed)
Pt placed on bed pan dark red blood in pan upon removal occult card taken to lab for resulting

## 2022-12-19 NOTE — Progress Notes (Signed)
John Gamble is a 89 y.o. male with medical history significant for lower GI bleed from diverticular disease, coronary artery disease status post PCI with stenting on low dose ASA , hypertension, hyperlipidemia, type 2 diabetes, history of mild reflux esophagitis, mild erosive gastroduodenitis, seen on EGD done on 08/04/2021.  Who presents to [facility] ED from home due to rectal bleeding, bright red blood per rectum,. He was admitted for acute diverticulitis and rectal bleeding.  He was started on IV Unasyn and GI consulted.  General exam: Appears calm and comfortable  Respiratory system: Clear to auscultation. Respiratory effort normal. Cardiovascular system: S1 & S2 heard, RRR. No JVD,  Gastrointestinal system: Abdomen is nondistended, soft and nontender.  Central nervous system: Alert and oriented. No focal neurological deficits. Extremities: Symmetric 5 x 5 power. Skin: No rashes,  Psychiatry: Mood & affect appropriate.   Monitor for rectal bleeding.  Clear liquid diet today.  GI consulted.    Kathlen Mody , MD

## 2022-12-19 NOTE — ED Notes (Signed)
ED TO INPATIENT HANDOFF REPORT  ED Nurse Name and Phone #: [phone]  S Name/Age/Gender John Gamble 89 y.o. male Room/Bed: 015C/015C  Code Status   Code Status: Full Code  Home/SNF/Other Home Patient oriented to: self, place, time, and situation Is this baseline? Yes   Triage Complete: Triage complete  Chief Complaint Rectal bleeding [K62.5]  Triage Note Pt reports having two episodes of rectal bleeding tonight. Pt states he "almost did not make it to the bathroom... it was bright red. And Filled the commode."  Pt is pale, dizzy and weak.     Allergies Allergies  Allergen Reactions   Atorvastatin Other (See Comments)    myalgias   Oysters [Shellfish Allergy] Nausea Only   Sulfa Antibiotics Nausea And Vomiting    Level of Care/Admitting Diagnosis ED Disposition     ED Disposition  Admit   Condition  --   Comment  Hospital Area: [facility] [[id number]]  Level of Care: Progressive [102]  Admit to Progressive based on following criteria: GI, ENDOCRINE disease patients with GI bleeding, acute liver failure or pancreatitis, stable with diabetic ketoacidosis or thyrotoxicosis (hypothyroid) state.  May admit patient to [facility] or [facility] if equivalent level of care is available:: No  Covid Evaluation: Asymptomatic - no recent exposure (last 10 days) testing not required  Diagnosis: Rectal bleeding [217577]  Admitting Physician: Darlin Drop [[id number]]  Attending Physician: Darlin Drop [[id number]]  Certification:: I certify this patient will need inpatient services for at least 2 midnights  Estimated Length of Stay: 2          B Medical/Surgery History Past Medical History:  Diagnosis Date   Coronary artery disease    Hyperlipidemia    Hypertension    MI, old    OA (osteoarthritis)    Pneumonia    hx of 2013    Past Surgical History:  Procedure Laterality Date   BIOPSY  08/04/2021   Procedure: BIOPSY;  Surgeon: Hilarie Fredrickson, MD;   Location: [facility] ENDOSCOPY;  Service: Endoscopy;;   CARDIAC CATHETERIZATION  01/26/1999   EF 55%   CARDIAC CATHETERIZATION N/A 05/06/2015   Procedure: Left Heart Cath and Coronary Angiography;  Surgeon: Peter M Swaziland, MD;  Location: [facility] INVASIVE CV LAB;  Service: Cardiovascular;  Laterality: N/A;   CARDIOVASCULAR STRESS TEST  02/22/2010   EF 61%   COLONOSCOPY WITH PROPOFOL N/A 08/04/2021   Procedure: COLONOSCOPY WITH PROPOFOL;  Surgeon: Hilarie Fredrickson, MD;  Location: [facility] ENDOSCOPY;  Service: Endoscopy;  Laterality: N/A;   corneal implant     left   CORONARY ATHERECTOMY N/A 11/04/2020   Procedure: CORONARY ATHERECTOMY;  Surgeon: Swaziland, Peter M, MD;  Location: [facility] INVASIVE CV LAB;  Service: Cardiovascular;  Laterality: N/A;   CORONARY STENT INTERVENTION N/A 11/04/2020   Procedure: CORONARY STENT INTERVENTION;  Surgeon: Swaziland, Peter M, MD;  Location: [facility] INVASIVE CV LAB;  Service: Cardiovascular;  Laterality: N/A;   CORONARY STENT PLACEMENT  2000   BMS LEFT CIRCUMFLEX CORONARY   CORONARY ULTRASOUND/IVUS N/A 11/04/2020   Procedure: Intravascular Ultrasound/IVUS;  Surgeon: Swaziland, Peter M, MD;  Location: [facility] INVASIVE CV LAB;  Service: Cardiovascular;  Laterality: N/A;   ESOPHAGOGASTRODUODENOSCOPY N/A 08/04/2021   Procedure: ESOPHAGOGASTRODUODENOSCOPY (EGD);  Surgeon: Hilarie Fredrickson, MD;  Location: [facility] ENDOSCOPY;  Service: Endoscopy;  Laterality: N/A;   INGUINAL HERNIA REPAIR     REPLACEMENT TOTAL KNEE     RIGHT/LEFT HEART CATH AND CORONARY ANGIOGRAPHY N/A 10/27/2020  Procedure: RIGHT/LEFT HEART CATH AND CORONARY ANGIOGRAPHY;  Surgeon: Swaziland, Peter M, MD;  Location: [facility] INVASIVE CV LAB;  Service: Cardiovascular;  Laterality: N/A;   TOTAL KNEE ARTHROPLASTY Left 09/19/2014   Procedure: LEFT TOTAL KNEE ARTHROPLASTY;  Surgeon: Eugenia Mcalpine, MD;  Location: [facility] ORS;  Service: Orthopedics;  Laterality: Left;     A IV Location/Drains/Wounds Patient Lines/Drains/Airways Status     Active Line/Drains/Airways      Name Placement date Placement time Site Days   Peripheral IV 12/18/22 20 G Anterior;Right Forearm 12/18/22  2339  Forearm  1            Intake/Output Last 24 hours  Intake/Output Summary (Last 24 hours) at 12/19/2022 0404 Last data filed at 12/19/2022 0355 Gross per 24 hour  Intake 1100 ml  Output --  Net 1100 ml    Labs/Imaging Results for orders placed or performed during the hospital encounter of 12/18/22 (from the past 48 hour(s))  Comprehensive metabolic panel     Status: Abnormal   Collection Time: 12/18/22 11:20 PM  Result Value Ref Range   Sodium 136 135 - 145 mmol/L   Potassium 4.6 3.5 - 5.1 mmol/L   Chloride 108 98 - 111 mmol/L   CO2 18 (L) 22 - 32 mmol/L   Glucose, Bld 293 (H) 70 - 99 mg/dL    Comment: Glucose reference range applies only to samples taken after fasting for at least 8 hours.   BUN 18 8 - 23 mg/dL   Creatinine, Ser 4.09 0.61 - 1.24 mg/dL   Calcium 8.9 8.9 - 81.1 mg/dL   Total Protein 5.9 (L) 6.5 - 8.1 g/dL   Albumin 3.5 3.5 - 5.0 g/dL   AST 20 15 - 41 U/L   ALT 18 0 - 44 U/L   Alkaline Phosphatase 60 38 - 126 U/L   Total Bilirubin 0.7 0.3 - 1.2 mg/dL   GFR, Estimated 60 (L) >60 mL/min    Comment: (NOTE) Calculated using the CKD-EPI Creatinine Equation (2021)    Anion gap 10 5 - 15    Comment: Performed at [facility], [address] [place], [place] [address]  CBC     Status: Abnormal   Collection Time: 12/18/22 11:20 PM  Result Value Ref Range   WBC 15.5 (H) 4.0 - 10.5 K/uL   RBC 4.96 4.22 - 5.81 MIL/uL   Hemoglobin 12.6 (L) 13.0 - 17.0 g/dL   HCT 29.5 62.1 - 30.8 %   MCV 81.7 80.0 - 100.0 fL   MCH 25.4 (L) 26.0 - 34.0 pg   MCHC 31.1 30.0 - 36.0 g/dL   RDW 65.7 (H) 84.6 - 96.2 %   Platelets 383 150 - 400 K/uL   nRBC 0.0 0.0 - 0.2 %    Comment: Performed at [facility], [address] [place], [place] [address]  Type and screen [facility]     Status: None   Collection Time: 12/18/22 11:20 PM   Result Value Ref Range   ABO/RH(D) A POS    Antibody Screen NEG    Sample Expiration      12/21/2022,2359 Performed at [facility], [address] [place], [place] [address]   POC occult blood, ED     Status: Abnormal   Collection Time: 12/19/22 12:07 AM  Result Value Ref Range   Fecal Occult Bld POSITIVE (A) NEGATIVE   CT ANGIO GI BLEED  Result Date: 12/19/2022 CLINICAL DATA:  Gastrointestinal  hemorrhage EXAM: CTA ABDOMEN AND PELVIS WITHOUT AND WITH CONTRAST TECHNIQUE: Multidetector CT imaging of the abdomen and pelvis was performed using the standard protocol during bolus administration of intravenous contrast. Multiplanar reconstructed images and MIPs were obtained and reviewed to evaluate the vascular anatomy. RADIATION DOSE REDUCTION: This exam was performed according to the departmental dose-optimization program which includes automated exposure control, adjustment of the mA and/or kV according to patient size and/or use of iterative reconstruction technique. CONTRAST:  OMNIPAQUE IOHEXOL 350 MG/ML SOLN COMPARISON:  03/28/2022 FINDINGS: VASCULAR Aorta: Normal caliber aorta without aneurysm, dissection, vasculitis or significant stenosis. Moderate atherosclerotic calcification Celiac: Patent without evidence of aneurysm, dissection, vasculitis or significant stenosis. SMA: Patent without evidence of aneurysm, dissection, vasculitis or significant stenosis. Renals: Both renal arteries are patent without evidence of aneurysm, dissection, vasculitis, fibromuscular dysplasia or significant stenosis. IMA: Patent without evidence of aneurysm, dissection, vasculitis or significant stenosis. Inflow: Patent without evidence of aneurysm, dissection, vasculitis or significant stenosis. Proximal Outflow: Bilateral common femoral and visualized portions of the superficial and profunda femoral arteries are patent without evidence of aneurysm, dissection, vasculitis or significant stenosis. Veins:  Unremarkable. In particular, the inferior and superior mesenteric veins and portal vein are patent. Review of the MIP images confirms the above findings. NON-VASCULAR Lower chest: Mild bibasilar cylindrical bronchiectasis. Extensive coronary artery calcification. Stable mild cardiomegaly. Hepatobiliary: Tiny cyst within the lateral segment of the left hepatic lobe. Liver otherwise unremarkable. Cholelithiasis without pericholecystic inflammatory change. No intra or extrahepatic biliary ductal dilation. Pancreas: Unremarkable Spleen: Unremarkable Adrenals/Urinary Tract: The adrenal glands are unremarkable. The kidneys are normal in size and position. Scattered cortical hypodensities are seen within the kidneys bilaterally which are too small to accurately characterize but likely represent multiple tiny cortical cysts. No follow-up imaging is recommended for these lesions. The kidneys are otherwise unremarkable. The bladder is trabeculated and there are innumerable small diverticula identified in keeping with changes of chronic bladder outlet obstruction. The bladder is not distended. Stomach/Bowel: No active gastrointestinal hemorrhage is identified. There is severe descending and sigmoid colonic diverticulosis. There is superimposed focal pericolonic inflammatory change involving the proximal sigmoid colon best seen on axial image # 158/17 in keeping with mild acute uncomplicated sigmoid diverticulitis. There is no evidence of obstruction or perforation. No loculated pericolonic fluid collections are identified. The stomach, small bowel, and large bowel are otherwise unremarkable. Appendix normal. No free intraperitoneal gas or fluid. Lymphatic: No pathologic adenopathy within the abdomen and pelvis. Reproductive: Marked prostatic enlargement. Other: Small fat containing left inguinal hernia Musculoskeletal: Degenerative changes are seen throughout the lumbar spine. No acute bone abnormality. Osseous structures are  diffusely osteopenic. IMPRESSION: 1. No active gastrointestinal hemorrhage identified. 2. Extensive coronary artery calcification. Stable mild cardiomegaly. 3. Cholelithiasis. 4. Mild acute uncomplicated sigmoid diverticulitis. Severe superimposed distal colonic diverticulosis. 5. Marked prostatic enlargement with evidence of chronic bladder outlet obstruction. Aortic Atherosclerosis (ICD10-I70.0). Electronically Signed   By: Helyn Numbers M.D.   On: 12/19/2022 02:19    Pending Labs Unresulted Labs (From admission, onward)     Start     Ordered   12/19/22 0359  Hemoglobin and hematocrit, blood  Now then every 6 hours,   R      12/19/22 0358            Vitals/Pain Today's Vitals   12/19/22 0230 12/19/22 0315 12/19/22 0319 12/19/22 0345  BP: 115/64 120/67  (!) 123/57  Pulse: (!) 55 (!) 51  (!) 55  Resp: 19  Temp:   98.3 F (36.8 C)   TempSrc:   Oral   SpO2: 97% 99%  95%  Height:      PainSc:        Isolation Precautions No active isolations  Medications Medications  lactated ringers bolus 1,000 mL (0 mLs Intravenous Stopped 12/19/22 0355)  iohexol (OMNIPAQUE) 350 MG/ML injection 100 mL (100 mLs Intravenous Contrast Given 12/19/22 0157)  ampicillin-sulbactam (UNASYN) 1.5 g in sodium chloride 0.9 % 100 mL IVPB (0 g Intravenous Stopped 12/19/22 0355)    Mobility walks with device     Focused Assessments GI bleed    R Recommendations: See Admitting Provider Note  Report given to:   Additional Notes: Pt a/o x 4 uses walker at home lives with spouse having dark red blloody stool vs wnl voids on bed pan and urinal

## 2022-12-19 NOTE — ED Notes (Signed)
ED TO INPATIENT HANDOFF REPORT  ED Nurse Name and Phone #: [phone], Lendon Ka Name/Age/Gender John Gamble 89 y.o. male Room/Bed: 011C/011C  Code Status   Code Status: Full Code  Home/SNF/Other Home Patient oriented to: self, place, time, and situation Is this baseline? Yes   Triage Complete: Triage complete  Chief Complaint Rectal bleeding [K62.5]  Triage Note Pt reports having two episodes of rectal bleeding tonight. Pt states he "almost did not make it to the bathroom... it was bright red. And Filled the commode."  Pt is pale, dizzy and weak.     Allergies Allergies  Allergen Reactions   Atorvastatin Other (See Comments)    myalgias   Oysters [Shellfish Allergy] Nausea Only   Sulfa Antibiotics Nausea And Vomiting    Level of Care/Admitting Diagnosis ED Disposition     ED Disposition  Admit   Condition  --   Comment  Hospital [facility]: [facility] [[id number]]  Level of Care: Progressive [102]  Admit to Progressive based on following criteria: GI, ENDOCRINE disease patients with GI bleeding, acute liver failure or pancreatitis, stable with diabetic ketoacidosis or thyrotoxicosis (hypothyroid) state.  May admit patient to [facility] or [facility] if equivalent level of care is available:: No  Covid Evaluation: Asymptomatic - no recent exposure (last 10 days) testing not required  Diagnosis: Rectal bleeding [217577]  Admitting Physician: Darlin Drop [[id number]]  Attending Physician: Darlin Drop [[id number]]  Certification:: I certify this patient will need inpatient services for at least 2 midnights  Estimated Length of Stay: 2          B Medical/Surgery History Past Medical History:  Diagnosis Date   Coronary artery disease    Hyperlipidemia    Hypertension    MI, old    OA (osteoarthritis)    Pneumonia    hx of 2013    Past Surgical History:  Procedure Laterality Date   BIOPSY  08/04/2021   Procedure: BIOPSY;  Surgeon: Hilarie Fredrickson, MD;  Location: [facility] ENDOSCOPY;  Service: Endoscopy;;   CARDIAC CATHETERIZATION  01/26/1999   EF 55%   CARDIAC CATHETERIZATION N/A 05/06/2015   Procedure: Left Heart Cath and Coronary Angiography;  Surgeon: Peter M Swaziland, MD;  Location: [facility] INVASIVE CV LAB;  Service: Cardiovascular;  Laterality: N/A;   CARDIOVASCULAR STRESS TEST  02/22/2010   EF 61%   COLONOSCOPY WITH PROPOFOL N/A 08/04/2021   Procedure: COLONOSCOPY WITH PROPOFOL;  Surgeon: Hilarie Fredrickson, MD;  Location: [facility] ENDOSCOPY;  Service: Endoscopy;  Laterality: N/A;   corneal implant     left   CORONARY ATHERECTOMY N/A 11/04/2020   Procedure: CORONARY ATHERECTOMY;  Surgeon: Swaziland, Peter M, MD;  Location: [facility] INVASIVE CV LAB;  Service: Cardiovascular;  Laterality: N/A;   CORONARY STENT INTERVENTION N/A 11/04/2020   Procedure: CORONARY STENT INTERVENTION;  Surgeon: Swaziland, Peter M, MD;  Location: [facility] INVASIVE CV LAB;  Service: Cardiovascular;  Laterality: N/A;   CORONARY STENT PLACEMENT  2000   BMS LEFT CIRCUMFLEX CORONARY   CORONARY ULTRASOUND/IVUS N/A 11/04/2020   Procedure: Intravascular Ultrasound/IVUS;  Surgeon: Swaziland, Peter M, MD;  Location: [facility] INVASIVE CV LAB;  Service: Cardiovascular;  Laterality: N/A;   ESOPHAGOGASTRODUODENOSCOPY N/A 08/04/2021   Procedure: ESOPHAGOGASTRODUODENOSCOPY (EGD);  Surgeon: Hilarie Fredrickson, MD;  Location: [facility] ENDOSCOPY;  Service: Endoscopy;  Laterality: N/A;   INGUINAL HERNIA REPAIR     REPLACEMENT TOTAL KNEE     RIGHT/LEFT HEART CATH AND CORONARY ANGIOGRAPHY N/A  10/27/2020   Procedure: RIGHT/LEFT HEART CATH AND CORONARY ANGIOGRAPHY;  Surgeon: Swaziland, Peter M, MD;  Location: [facility] INVASIVE CV LAB;  Service: Cardiovascular;  Laterality: N/A;   TOTAL KNEE ARTHROPLASTY Left 09/19/2014   Procedure: LEFT TOTAL KNEE ARTHROPLASTY;  Surgeon: Eugenia Mcalpine, MD;  Location: [facility] ORS;  Service: Orthopedics;  Laterality: Left;     A IV Location/Drains/Wounds Patient Lines/Drains/Airways Status     Active  Line/Drains/Airways     Name Placement date Placement time Site Days   Peripheral IV 12/18/22 20 G Anterior;Right Forearm 12/18/22  2339  Forearm  1            Intake/Output Last 24 hours  Intake/Output Summary (Last 24 hours) at 12/19/2022 1250 Last data filed at 12/19/2022 0934 Gross per 24 hour  Intake 1200 ml  Output --  Net 1200 ml    Labs/Imaging Results for orders placed or performed during the hospital encounter of 12/18/22 (from the past 48 hour(s))  Comprehensive metabolic panel     Status: Abnormal   Collection Time: 12/18/22 11:20 PM  Result Value Ref Range   Sodium 136 135 - 145 mmol/L   Potassium 4.6 3.5 - 5.1 mmol/L   Chloride 108 98 - 111 mmol/L   CO2 18 (L) 22 - 32 mmol/L   Glucose, Bld 293 (H) 70 - 99 mg/dL    Comment: Glucose reference range applies only to samples taken after fasting for at least 8 hours.   BUN 18 8 - 23 mg/dL   Creatinine, Ser 1.61 0.61 - 1.24 mg/dL   Calcium 8.9 8.9 - 09.6 mg/dL   Total Protein 5.9 (L) 6.5 - 8.1 g/dL   Albumin 3.5 3.5 - 5.0 g/dL   AST 20 15 - 41 U/L   ALT 18 0 - 44 U/L   Alkaline Phosphatase 60 38 - 126 U/L   Total Bilirubin 0.7 0.3 - 1.2 mg/dL   GFR, Estimated 60 (L) >60 mL/min    Comment: (NOTE) Calculated using the CKD-EPI Creatinine Equation (2021)    Anion gap 10 5 - 15    Comment: Performed at [facility], [address], [place], [place] [address]  CBC     Status: Abnormal   Collection Time: 12/18/22 11:20 PM  Result Value Ref Range   WBC 15.5 (H) 4.0 - 10.5 K/uL   RBC 4.96 4.22 - 5.81 MIL/uL   Hemoglobin 12.6 (L) 13.0 - 17.0 g/dL   HCT 98.1 19.1 - 47.8 %   MCV 81.7 80.0 - 100.0 fL   MCH 25.4 (L) 26.0 - 34.0 pg   MCHC 31.1 30.0 - 36.0 g/dL   RDW 29.5 (H) 62.1 - 30.8 %   Platelets 383 150 - 400 K/uL   nRBC 0.0 0.0 - 0.2 %    Comment: Performed at [facility], [address] [place], [place] [address]  Type and screen [facility]     Status: None   Collection Time:  12/18/22 11:20 PM  Result Value Ref Range   ABO/RH(D) A POS    Antibody Screen NEG    Sample Expiration      12/21/2022,2359 Performed at [facility], [address], [place], [place] [address]   POC occult blood, ED     Status: Abnormal   Collection Time: 12/19/22 12:07 AM  Result Value Ref Range   Fecal Occult Bld POSITIVE (A) NEGATIVE  Hemoglobin A1c     Status: Abnormal  Collection Time: 12/19/22  5:00 AM  Result Value Ref Range   Hgb A1c MFr Bld 6.7 (H) 4.8 - 5.6 %    Comment: (NOTE) Pre diabetes:          5.7%-6.4%  Diabetes:              >6.4%  Glycemic control for   <7.0% adults with diabetes    Mean Plasma Glucose 145.59 mg/dL    Comment: Performed at [facility], [address] [place], [place] [address]  CBC     Status: Abnormal   Collection Time: 12/19/22  5:00 AM  Result Value Ref Range   WBC 10.6 (H) 4.0 - 10.5 K/uL   RBC 4.15 (L) 4.22 - 5.81 MIL/uL   Hemoglobin 10.6 (L) 13.0 - 17.0 g/dL   HCT 19.1 (L) 47.8 - 29.5 %   MCV 81.4 80.0 - 100.0 fL   MCH 25.5 (L) 26.0 - 34.0 pg   MCHC 31.4 30.0 - 36.0 g/dL   RDW 62.1 (H) 30.8 - 65.7 %   Platelets 271 150 - 400 K/uL   nRBC 0.0 0.0 - 0.2 %    Comment: Performed at [facility], [address] [place], [place] [address]  Basic metabolic panel     Status: Abnormal   Collection Time: 12/19/22  6:20 AM  Result Value Ref Range   Sodium 135 135 - 145 mmol/L   Potassium 4.4 3.5 - 5.1 mmol/L   Chloride 107 98 - 111 mmol/L   CO2 21 (L) 22 - 32 mmol/L   Glucose, Bld 140 (H) 70 - 99 mg/dL    Comment: Glucose reference range applies only to samples taken after fasting for at least 8 hours.   BUN 17 8 - 23 mg/dL   Creatinine, Ser 2.95 0.61 - 1.24 mg/dL   Calcium 8.5 (L) 8.9 - 10.3 mg/dL   GFR, Estimated >28 >41 mL/min    Comment: (NOTE) Calculated using the CKD-EPI Creatinine Equation (2021)    Anion gap 7 5 - 15    Comment: Performed at [facility], [address], [place], [place]  [address]  Magnesium     Status: None   Collection Time: 12/19/22  6:20 AM  Result Value Ref Range   Magnesium 1.9 1.7 - 2.4 mg/dL    Comment: Performed at [facility], [address], [place], [place] [address]  Phosphorus     Status: None   Collection Time: 12/19/22  6:20 AM  Result Value Ref Range   Phosphorus 3.0 2.5 - 4.6 mg/dL    Comment: Performed at [facility], [address], [place], [place] [address]  Lactic acid, plasma     Status: None   Collection Time: 12/19/22  6:20 AM  Result Value Ref Range   Lactic Acid, Venous 1.6 0.5 - 1.9 mmol/L    Comment: Performed at [facility], [address], [place], [place] [address]  CBG monitoring, ED     Status: Abnormal   Collection Time: 12/19/22  8:02 AM  Result Value Ref Range   Glucose-Capillary 127 (H) 70 - 99 mg/dL    Comment: Glucose reference range applies only to samples taken after fasting for at least 8 hours.  Hemoglobin and hematocrit, blood     Status: Abnormal   Collection Time: 12/19/22  9:59 AM  Result Value Ref Range   Hemoglobin 10.6 (L) 13.0 - 17.0 g/dL  HCT 33.8 (L) 39.0 - 52.0 %    Comment: Performed at [facility], [address], [place], [place] [address]  CBG monitoring, ED     Status: Abnormal   Collection Time: 12/19/22 11:51 AM  Result Value Ref Range   Glucose-Capillary 133 (H) 70 - 99 mg/dL    Comment: Glucose reference range applies only to samples taken after fasting for at least 8 hours.   CT ANGIO GI BLEED  Result Date: 12/19/2022 CLINICAL DATA:  Gastrointestinal hemorrhage EXAM: CTA ABDOMEN AND PELVIS WITHOUT AND WITH CONTRAST TECHNIQUE: Multidetector CT imaging of the abdomen and pelvis was performed using the standard protocol during bolus administration of intravenous contrast. Multiplanar reconstructed images and MIPs were obtained and reviewed to evaluate the vascular anatomy. RADIATION DOSE REDUCTION: This exam was performed according to the departmental  dose-optimization program which includes automated exposure control, adjustment of the mA and/or kV according to patient size and/or use of iterative reconstruction technique. CONTRAST:  OMNIPAQUE IOHEXOL 350 MG/ML SOLN COMPARISON:  03/28/2022 FINDINGS: VASCULAR Aorta: Normal caliber aorta without aneurysm, dissection, vasculitis or significant stenosis. Moderate atherosclerotic calcification Celiac: Patent without evidence of aneurysm, dissection, vasculitis or significant stenosis. SMA: Patent without evidence of aneurysm, dissection, vasculitis or significant stenosis. Renals: Both renal arteries are patent without evidence of aneurysm, dissection, vasculitis, fibromuscular dysplasia or significant stenosis. IMA: Patent without evidence of aneurysm, dissection, vasculitis or significant stenosis. Inflow: Patent without evidence of aneurysm, dissection, vasculitis or significant stenosis. Proximal Outflow: Bilateral common femoral and visualized portions of the superficial and profunda femoral arteries are patent without evidence of aneurysm, dissection, vasculitis or significant stenosis. Veins: Unremarkable. In particular, the inferior and superior mesenteric veins and portal vein are patent. Review of the MIP images confirms the above findings. NON-VASCULAR Lower chest: Mild bibasilar cylindrical bronchiectasis. Extensive coronary artery calcification. Stable mild cardiomegaly. Hepatobiliary: Tiny cyst within the lateral segment of the left hepatic lobe. Liver otherwise unremarkable. Cholelithiasis without pericholecystic inflammatory change. No intra or extrahepatic biliary ductal dilation. Pancreas: Unremarkable Spleen: Unremarkable Adrenals/Urinary Tract: The adrenal glands are unremarkable. The kidneys are normal in size and position. Scattered cortical hypodensities are seen within the kidneys bilaterally which are too small to accurately characterize but likely represent multiple tiny cortical  cysts. No follow-up imaging is recommended for these lesions. The kidneys are otherwise unremarkable. The bladder is trabeculated and there are innumerable small diverticula identified in keeping with changes of chronic bladder outlet obstruction. The bladder is not distended. Stomach/Bowel: No active gastrointestinal hemorrhage is identified. There is severe descending and sigmoid colonic diverticulosis. There is superimposed focal pericolonic inflammatory change involving the proximal sigmoid colon best seen on axial image # 158/17 in keeping with mild acute uncomplicated sigmoid diverticulitis. There is no evidence of obstruction or perforation. No loculated pericolonic fluid collections are identified. The stomach, small bowel, and large bowel are otherwise unremarkable. Appendix normal. No free intraperitoneal gas or fluid. Lymphatic: No pathologic adenopathy within the abdomen and pelvis. Reproductive: Marked prostatic enlargement. Other: Small fat containing left inguinal hernia Musculoskeletal: Degenerative changes are seen throughout the lumbar spine. No acute bone abnormality. Osseous structures are diffusely osteopenic. IMPRESSION: 1. No active gastrointestinal hemorrhage identified. 2. Extensive coronary artery calcification. Stable mild cardiomegaly. 3. Cholelithiasis. 4. Mild acute uncomplicated sigmoid diverticulitis. Severe superimposed distal colonic diverticulosis. 5. Marked prostatic enlargement with evidence of chronic bladder outlet obstruction. Aortic Atherosclerosis (ICD10-I70.0). Electronically Signed   By: Helyn Numbers M.D.   On: 12/19/2022 02:19  Pending Labs Unresulted Labs (From admission, onward)     Start     Ordered   12/19/22 0511  Culture, blood (Routine X 2) w Reflex to ID Panel  BLOOD CULTURE X 2,   R     Question:  Patient immune status  Answer:  Normal   12/19/22 0510   12/19/22 0359  Hemoglobin and hematocrit, blood  Now then every 6 hours,   R      12/19/22 0358             Vitals/Pain Today's Vitals   12/19/22 1143 12/19/22 1215 12/19/22 1230 12/19/22 1236  BP: (!) 124/58 134/66 (!) 154/86   Pulse: (!) 42 (!) 39 (!) 41 68  Resp: (!) 21 (!) 22 (!) 26 16  Temp: 97.6 F (36.4 C)     TempSrc: Oral     SpO2: 100% 94% 99% 100%  Weight:      Height:      PainSc:        Isolation Precautions No active isolations  Medications Medications  acetaminophen (TYLENOL) tablet 650 mg (has no administration in time range)  prochlorperazine (COMPAZINE) injection 5 mg (has no administration in time range)  melatonin tablet 3 mg (has no administration in time range)  insulin aspart (novoLOG) injection 0-9 Units ( Subcutaneous Not Given 12/19/22 0806)  insulin aspart (novoLOG) injection 0-5 Units (has no administration in time range)  Ampicillin-Sulbactam (UNASYN) 3 g in sodium chloride 0.9 % 100 mL IVPB (0 g Intravenous Stopped 12/19/22 0818)  lactated ringers bolus 1,000 mL (0 mLs Intravenous Stopped 12/19/22 0355)  iohexol (OMNIPAQUE) 350 MG/ML injection 100 mL (100 mLs Intravenous Contrast Given 12/19/22 0157)  ampicillin-sulbactam (UNASYN) 1.5 g in sodium chloride 0.9 % 100 mL IVPB (0 g Intravenous Stopped 12/19/22 0355)    Mobility walks     Focused Assessments    R Recommendations: See Admitting Provider Note  Report given to:   Additional Notes:

## 2022-12-19 NOTE — Consult Note (Signed)
Consultation  Referring Provider:  [facility]  Primary Care Physician:  Olive Bass, MD Primary Gastroenterologist:  [facility]       Reason for Consultation:   Rectal bleeding  LOS: 0 days          HPI:   John Gamble is a 89 y.o. male with past medical history significant for diverticular bleeds, CAD s/p PCI with stenting (10/2020) on low-dose ASA, hypertension, hyperlipidemia, type 2 diabetes, GERD, presents for evaluation of rectal bleeding.  Patient is a GI patient of Dr. Kinnie Scales with digestive health  08/04/2021 colonoscopy for GI bleeding as an inpatient showed diverticulosis in the entire examined colon and bleed was felt to be a self-limited diverticular bleed. EGD on the same day with mild reflux esophagitis and mild erosive gastroduodenitis   Patient seen inpatient August 2023 for repeat episodes of bleeding.  He was observed with resolution of bleeding and discharged with Hgb 13.4.  Patient presents today due to bright red blood per rectum with associated periumbilical abdominal cramping.  Patient states yesterday evening around 7 PM he had an urge to go to the bathroom.  When he had a bowel movement he noticed the toilet bowl filled with bright red blood.  Had another episode at 8 PM.  Came to emergency department at 10:45 PM.  States he was having periumbilical pain.  This has since resolved.  Last episode of rectal bleeding was about 30 minutes ago.  He states the most recent episode was 1 big clot which he feels is improved from his episodes yesterday.   CTA did not show active bleeding, however, showed mild acute sigmoid diverticulitis without complication.  Patient was put on IV antibiotics in the ED and admitted.  Upon admission WBC 15.5, Hgb 12.6.  Positive fecal occult  Today Hgb 10.6.  BUN 17, creatinine 0.92.  WBC 10.6  Patient states he has been struggling with constipation that has getting progressively worse.  He typically takes MiraLAX once daily.   He missed 3 days of MiraLAX this past week.  States he also experienced episodes of fecal incontinence and diarrhea while being off of miralax.  He exercises regularly at [employer] and uses a bicycle machine.    He also reports history of multiple hemorrhoids which have been banded in the past by Dr. Kinnie Scales.  Past Medical History:  Diagnosis Date   Coronary artery disease    Hyperlipidemia    Hypertension    MI, old    OA (osteoarthritis)    Pneumonia    hx of 2013     Surgical History:  He  has a past surgical history that includes Cardiac catheterization (01/26/1999); Coronary stent placement (2000); Replacement total knee; Inguinal hernia repair; Cardiovascular stress test (02/22/2010); corneal implant; Total knee arthroplasty (Left, 09/19/2014); Cardiac catheterization (N/A, 05/06/2015); RIGHT/LEFT HEART CATH AND CORONARY ANGIOGRAPHY (N/A, 10/27/2020); CORONARY STENT INTERVENTION (N/A, 11/04/2020); CORONARY ATHERECTOMY (N/A, 11/04/2020); Coronary Ultrasound/IVUS (N/A, 11/04/2020); Colonoscopy with propofol (N/A, 08/04/2021); Esophagogastroduodenoscopy (N/A, 08/04/2021); and biopsy (08/04/2021). Family History:  His family history includes Cancer in his father and mother. Social History:   reports that he quit smoking about 33 years ago. His smoking use included cigarettes. He has a 25.00 pack-year smoking history. He has never used smokeless tobacco. He reports that he does not drink alcohol and does not use drugs.  Prior to Admission medications   Medication Sig Start Date End Date Taking? Authorizing Provider  acyclovir (ZOVIRAX) 800  MG tablet Take 800 mg by mouth daily.  03/03/14   [provider]  aspirin EC 81 MG tablet Take 1 tablet (81 mg total) by mouth daily. 03/28/22   Sabino Dick, DO  dorzolamide (TRUSOPT) 2 % ophthalmic solution Place 1 drop into both eyes 2 (two) times daily.    [provider]  finasteride (PROSCAR) 5 MG tablet Take 1 tablet (5 mg  total) by mouth daily. 11/20/20   [place], Peter M, MD  Glucosamine-Chondroitin 250-200 MG TABS Take 2 tablets by mouth 2 (two) times daily.    [provider]  glucose blood (ONETOUCH ULTRA) test strip USE 1 STRIP DAILY. 08/20/20   [provider]  ketoconazole (NIZORAL) 2 % cream Apply 1 Application topically daily. 08/22/22   Standiford, Jenelle Mages, DPM  ketoconazole (NIZORAL) 2 % cream Apply 1 Application topically daily. 08/26/22   Standiford, Jenelle Mages, DPM  ketoconazole (NIZORAL) 2 % cream Apply 1 Application topically daily. 11/01/22   Standiford, Jenelle Mages, DPM  ketoconazole (NIZORAL) 2 % cream Apply 1 Application topically daily. 11/22/22   Standiford, Jenelle Mages, DPM  Lancets (ONETOUCH DELICA PLUS [place]) MISC SMARTSIG:1 Unit(s) Topical Daily 07/01/20   [provider]  losartan (COZAAR) 50 MG tablet Take 1 tablet (50 mg total) by mouth daily. 03/02/22 02/25/23  [place], Peter M, MD  metFORMIN (GLUCOPHAGE-XR) 500 MG 24 hr tablet Take 500 mg by mouth 2 (two) times daily. 10/20/22   [provider]  metoprolol succinate (TOPROL-XL) 25 MG 24 hr tablet TAKE 1 TABLET (25 MG TOTAL) BY MOUTH DAILY. 08/22/22   [place], Peter M, MD  Multiple Vitamin (MULTIVITAMIN WITH MINERALS) TABS tablet Take 1 tablet by mouth daily. Centrum Silver    [provider]  Omega-3 Fatty Acids (FISH OIL) 1200 MG CAPS Take 1,200 mg by mouth daily.    [provider]  [facility] ULTRA test strip daily. 08/20/20   [provider]  pantoprazole (PROTONIX) 40 MG tablet Take 1 tablet (40 mg total) by mouth daily at 6 (six) AM. 08/06/21   Andrey Campanile, MD  polyethylene glycol (MIRALAX / Ethelene Hal) packet Take 17 g by mouth daily as needed for mild constipation. Mix with 8 oz liquid and drink    [provider]  prednisoLONE acetate (PRED FORTE) 1 % ophthalmic suspension Place 1 drop into the left eye at bedtime. 09/12/20   [provider]  rosuvastatin  (CRESTOR) 20 MG tablet Take 20 mg by mouth daily.    [provider]  silodosin (RAPAFLO) 8 MG CAPS capsule Take 8 mg by mouth daily. 12/04/20   [provider]  spironolactone (ALDACTONE) 25 MG tablet Take 0.5 tablets (12.5 mg total) by mouth daily. 01/13/22   [place], Peter M, MD  Zinc 50 MG CAPS Take 50 mg by mouth daily with supper.     [provider]    Current Facility-Administered Medications  Medication Dose Route Frequency Provider Last Rate Last Admin   acetaminophen (TYLENOL) tablet 650 mg  650 mg Oral Q6H PRN Hall, Carole N, DO       Ampicillin-Sulbactam (UNASYN) 3 g in sodium chloride 0.9 % 100 mL IVPB  3 g Intravenous Q8H Hall, Carole N, DO   Stopped at 12/19/22 0818   insulin aspart (novoLOG) injection 0-5 Units  0-5 Units Subcutaneous QHS Hall, Carole N, DO       insulin aspart (novoLOG) injection 0-9 Units  0-9 Units Subcutaneous TID WC Darlin Drop, DO  melatonin tablet 3 mg  3 mg Oral QHS PRN Dow Adolph N, DO       prochlorperazine (COMPAZINE) injection 5 mg  5 mg Intravenous Q6H PRN Dow Adolph N, DO        Allergies as of 12/18/2022 - Review Complete 12/18/2022  Allergen Reaction Noted   Atorvastatin Other (See Comments) 06/30/2016   Oysters [shellfish allergy] Nausea Only 12/25/2013   Sulfa antibiotics Nausea And Vomiting 12/25/2013    Review of Systems  Constitutional:  Negative for chills, fever and weight loss.  HENT:  Negative for hearing loss and tinnitus.   Eyes:  Negative for blurred vision and double vision.  Respiratory:  Negative for cough and hemoptysis.   Cardiovascular:  Negative for chest pain and palpitations.  Gastrointestinal:  Positive for abdominal pain, blood in stool, constipation and diarrhea. Negative for heartburn, melena, nausea and vomiting.  Genitourinary:  Negative for dysuria and urgency.  Musculoskeletal:  Negative for myalgias and neck pain.  Skin:  Negative for itching and rash.  Neurological:   Negative for seizures and loss of consciousness.  Psychiatric/Behavioral:  Negative for depression and suicidal ideas.        Physical Exam:  Vital signs in last 24 hours: Temp:  [97.4 F (36.3 C)-98.3 F (36.8 C)] 97.6 F (36.4 C) (05/20 1143) Pulse Rate:  [36-104] 68 (05/20 1236) Resp:  [15-30] 16 (05/20 1236) BP: (92-165)/(50-98) 154/86 (05/20 1230) SpO2:  [86 %-100 %] 100 % (05/20 1236) Weight:  [85.3 kg] 85.3 kg (05/20 0807)   Last BM recorded by nurses in past 5 days No data recorded  Physical Exam Constitutional:      Appearance: Normal appearance.     Comments: Appears younger than stated age  HENT:     Nose: Nose normal. No congestion.     Mouth/Throat:     Mouth: Mucous membranes are moist.     Pharynx: Oropharynx is clear.  Eyes:     Extraocular Movements: Extraocular movements intact.     Conjunctiva/sclera: Conjunctivae normal.  Cardiovascular:     Rate and Rhythm: Normal rate and regular rhythm.  Pulmonary:     Effort: Pulmonary effort is normal. No respiratory distress.  Abdominal:     General: Abdomen is flat. Bowel sounds are normal. There is no distension.     Palpations: Abdomen is soft. There is no mass.     Tenderness: There is no abdominal tenderness. There is no guarding or rebound.     Hernia: No hernia is present.  Musculoskeletal:        General: No swelling. Normal range of motion.     Cervical back: Normal range of motion and neck supple.  Skin:    General: Skin is warm and dry.  Neurological:     General: No focal deficit present.     Mental Status: He is alert and oriented to person, place, and time.  Psychiatric:        Mood and Affect: Mood normal.        Behavior: Behavior normal.        Thought Content: Thought content normal.        Judgment: Judgment normal.      LAB RESULTS: Recent Labs    12/18/22 2320 12/19/22 0500 12/19/22 0959  WBC 15.5* 10.6*  --   HGB 12.6* 10.6* 10.6*  HCT 40.5 33.8* 33.8*  PLT 383 271  --     BMET Recent Labs    12/18/22 2320 12/19/22 0981  NA 136 135  K 4.6 4.4  CL 108 107  CO2 18* 21*  GLUCOSE 293* 140*  BUN 18 17  CREATININE 1.16 0.92  CALCIUM 8.9 8.5*   LFT Recent Labs    12/18/22 2320  PROT 5.9*  ALBUMIN 3.5  AST 20  ALT 18  ALKPHOS 60  BILITOT 0.7   PT/INR No results for input(s): "LABPROT", "INR" in the last 72 hours.  STUDIES: CT ANGIO GI BLEED  Result Date: 12/19/2022 CLINICAL DATA:  Gastrointestinal hemorrhage EXAM: CTA ABDOMEN AND PELVIS WITHOUT AND WITH CONTRAST TECHNIQUE: Multidetector CT imaging of the abdomen and pelvis was performed using the standard protocol during bolus administration of intravenous contrast. Multiplanar reconstructed images and MIPs were obtained and reviewed to evaluate the vascular anatomy. RADIATION DOSE REDUCTION: This exam was performed according to the departmental dose-optimization program which includes automated exposure control, adjustment of the mA and/or kV according to patient size and/or use of iterative reconstruction technique. CONTRAST:  OMNIPAQUE IOHEXOL 350 MG/ML SOLN COMPARISON:  03/28/2022 FINDINGS: VASCULAR Aorta: Normal caliber aorta without aneurysm, dissection, vasculitis or significant stenosis. Moderate atherosclerotic calcification Celiac: Patent without evidence of aneurysm, dissection, vasculitis or significant stenosis. SMA: Patent without evidence of aneurysm, dissection, vasculitis or significant stenosis. Renals: Both renal arteries are patent without evidence of aneurysm, dissection, vasculitis, fibromuscular dysplasia or significant stenosis. IMA: Patent without evidence of aneurysm, dissection, vasculitis or significant stenosis. Inflow: Patent without evidence of aneurysm, dissection, vasculitis or significant stenosis. Proximal Outflow: Bilateral common femoral and visualized portions of the superficial and profunda femoral arteries are patent without evidence of aneurysm, dissection,  vasculitis or significant stenosis. Veins: Unremarkable. In particular, the inferior and superior mesenteric veins and portal vein are patent. Review of the MIP images confirms the above findings. NON-VASCULAR Lower chest: Mild bibasilar cylindrical bronchiectasis. Extensive coronary artery calcification. Stable mild cardiomegaly. Hepatobiliary: Tiny cyst within the lateral segment of the left hepatic lobe. Liver otherwise unremarkable. Cholelithiasis without pericholecystic inflammatory change. No intra or extrahepatic biliary ductal dilation. Pancreas: Unremarkable Spleen: Unremarkable Adrenals/Urinary Tract: The adrenal glands are unremarkable. The kidneys are normal in size and position. Scattered cortical hypodensities are seen within the kidneys bilaterally which are too small to accurately characterize but likely represent multiple tiny cortical cysts. No follow-up imaging is recommended for these lesions. The kidneys are otherwise unremarkable. The bladder is trabeculated and there are innumerable small diverticula identified in keeping with changes of chronic bladder outlet obstruction. The bladder is not distended. Stomach/Bowel: No active gastrointestinal hemorrhage is identified. There is severe descending and sigmoid colonic diverticulosis. There is superimposed focal pericolonic inflammatory change involving the proximal sigmoid colon best seen on axial image # 158/17 in keeping with mild acute uncomplicated sigmoid diverticulitis. There is no evidence of obstruction or perforation. No loculated pericolonic fluid collections are identified. The stomach, small bowel, and large bowel are otherwise unremarkable. Appendix normal. No free intraperitoneal gas or fluid. Lymphatic: No pathologic adenopathy within the abdomen and pelvis. Reproductive: Marked prostatic enlargement. Other: Small fat containing left inguinal hernia Musculoskeletal: Degenerative changes are seen throughout the lumbar spine. No  acute bone abnormality. Osseous structures are diffusely osteopenic. IMPRESSION: 1. No active gastrointestinal hemorrhage identified. 2. Extensive coronary artery calcification. Stable mild cardiomegaly. 3. Cholelithiasis. 4. Mild acute uncomplicated sigmoid diverticulitis. Severe superimposed distal colonic diverticulosis. 5. Marked prostatic enlargement with evidence of chronic bladder outlet obstruction. Aortic Atherosclerosis (ICD10-I70.0). Electronically Signed   By: Helyn Numbers M.D.   On: 12/19/2022 02:19  Impression    Rectal bleeding -Hgb 10.6 (12.6) -WBC 10.6, improved -BUN 17, creatinine 0.92 -CTA negative for active bleeding.  Does show mild acute uncomplicated sigmoid diverticulitis. Like diverticular bleed in addition to diverticulitis. Also could be diverticulitis with a flareup hemorrhoids especially with previous history of constipation/hemorrhoids.  Patient will need repeat colonoscopy as outpatient with primary GI after resolution of diverticulitis (in 6-8 weeks).  Constipation -Suspect episodes of fecal incontinence/diarrhea are likely from overflow diarrhea from not using MiraLAX regularly.  Patient will need outpatient GI follow-up to control constipation.  Suspect that constipation worsening lately could have resulted in a flare of hemorrhoids leading to his rectal bleeding vs diverticular source.  He does have a history of hemorrhoids which have been banded during a previous colonoscopy with Dr. Kinnie Scales.  CAD s/p PCI - on ASA  CHF   Plan   - Continue abx - Continue daily CBC and transfuse as needed to maintain HGB > 7  - continue supportive care - clear liquids - Miralax prn - no plans for procedures given diverticulitis. Would recommend outpatient colonoscopy with primary GI in 6-8 weeks after resolution of inflammation.  Thank you for your kind consultation, we will continue to follow.   Sanford Lindblad Leanna Sato  12/19/2022, 2:00 PM

## 2022-12-19 NOTE — ED Provider Notes (Signed)
[place] EMERGENCY DEPARTMENT AT [facility] Provider Note   CSN: [id number] Arrival date & time: 12/18/22  2244     History  Chief Complaint  Patient presents with   GI Bleeding    John Gamble is a 89 y.o. male.  Patient presents to the emergency department for evaluation of rectal bleeding.  Patient reports that he has had multiple episodes of red blood per rectum, starting at 6 PM tonight.  He reports that he filled the commode, now is feeling weak, dizzy, like he is going to pass out.  He does have some lower abdominal discomfort and cramping.       Home Medications Prior to Admission medications   Medication Sig Start Date End Date Taking? Authorizing Provider  acyclovir (ZOVIRAX) 800 MG tablet Take 800 mg by mouth daily.  03/03/14   [provider]  aspirin EC 81 MG tablet Take 1 tablet (81 mg total) by mouth daily. 03/28/22   Sabino Dick, DO  dorzolamide (TRUSOPT) 2 % ophthalmic solution Place 1 drop into both eyes 2 (two) times daily.    [provider]  finasteride (PROSCAR) 5 MG tablet Take 1 tablet (5 mg total) by mouth daily. 11/20/20   [place], Peter M, MD  Glucosamine-Chondroitin 250-200 MG TABS Take 2 tablets by mouth 2 (two) times daily.    [provider]  glucose blood (ONETOUCH ULTRA) test strip USE 1 STRIP DAILY. 08/20/20   [provider]  ketoconazole (NIZORAL) 2 % cream Apply 1 Application topically daily. 08/22/22   Standiford, Jenelle Mages, DPM  ketoconazole (NIZORAL) 2 % cream Apply 1 Application topically daily. 08/26/22   Standiford, Jenelle Mages, DPM  ketoconazole (NIZORAL) 2 % cream Apply 1 Application topically daily. 11/01/22   Standiford, Jenelle Mages, DPM  ketoconazole (NIZORAL) 2 % cream Apply 1 Application topically daily. 11/22/22   Standiford, Jenelle Mages, DPM  Lancets (ONETOUCH DELICA PLUS Ney) MISC SMARTSIG:1 Unit(s) Topical Daily 07/01/20   [provider]  losartan (COZAAR) 50 MG  tablet Take 1 tablet (50 mg total) by mouth daily. 03/02/22 02/25/23  [place], Peter M, MD  metoprolol succinate (TOPROL-XL) 25 MG 24 hr tablet TAKE 1 TABLET (25 MG TOTAL) BY MOUTH DAILY. 08/22/22   [place], Peter M, MD  Multiple Vitamin (MULTIVITAMIN WITH MINERALS) TABS tablet Take 1 tablet by mouth daily. Centrum Silver    [provider]  Omega-3 Fatty Acids (FISH OIL) 1200 MG CAPS Take 1,200 mg by mouth daily.    [provider]  [facility] ULTRA test strip daily. 08/20/20   [provider]  pantoprazole (PROTONIX) 40 MG tablet Take 1 tablet (40 mg total) by mouth daily at 6 (six) AM. 08/06/21   Andrey Campanile, MD  polyethylene glycol (MIRALAX / Ethelene Hal) packet Take 17 g by mouth daily as needed for mild constipation. Mix with 8 oz liquid and drink    [provider]  prednisoLONE acetate (PRED FORTE) 1 % ophthalmic suspension Place 1 drop into the left eye at bedtime. 09/12/20   [provider]  rosuvastatin (CRESTOR) 20 MG tablet Take 20 mg by mouth daily.    [provider]  silodosin (RAPAFLO) 8 MG CAPS capsule Take 8 mg by mouth daily. 12/04/20   [provider]  spironolactone (ALDACTONE) 25 MG tablet Take 0.5 tablets (12.5 mg total) by mouth daily. 01/13/22   [place], Peter M, MD  Zinc 50 MG CAPS Take 50 mg by mouth daily with supper.  [provider]      Allergies    Atorvastatin, Oysters [shellfish allergy], and Sulfa antibiotics    Review of Systems   Review of Systems  Physical Exam Updated Vital Signs BP 133/62   Pulse 65   Resp 19   Ht 6' (1.829 m)   SpO2 97%   BMI 25.74 kg/m  Physical Exam Vitals and nursing note reviewed.  Constitutional:      General: He is not in acute distress.    Appearance: He is well-developed.  HENT:     Head: Normocephalic and atraumatic.     Mouth/Throat:     Mouth: Mucous membranes are moist.  Eyes:     General: Vision grossly intact. Gaze aligned appropriately.      Extraocular Movements: Extraocular movements intact.     Conjunctiva/sclera: Conjunctivae normal.  Cardiovascular:     Rate and Rhythm: Normal rate and regular rhythm.     Pulses: Normal pulses.     Heart sounds: Normal heart sounds, S1 normal and S2 normal. No murmur heard.    No friction rub. No gallop.  Pulmonary:     Effort: Pulmonary effort is normal. No respiratory distress.     Breath sounds: Normal breath sounds.  Abdominal:     Palpations: Abdomen is soft.     Tenderness: There is no abdominal tenderness. There is no guarding or rebound.     Hernia: No hernia is present.  Musculoskeletal:        General: No swelling.     Cervical back: Full passive range of motion without pain, normal range of motion and neck supple. No pain with movement, spinous process tenderness or muscular tenderness. Normal range of motion.     Right lower leg: No edema.     Left lower leg: No edema.  Skin:    General: Skin is warm and dry.     Capillary Refill: Capillary refill takes less than 2 seconds.     Coloration: Skin is pale.     Findings: No ecchymosis, erythema, lesion or wound.  Neurological:     Mental Status: He is alert and oriented to person, place, and time.     GCS: GCS eye subscore is 4. GCS verbal subscore is 5. GCS motor subscore is 6.     Cranial Nerves: Cranial nerves 2-12 are intact.     Sensory: Sensation is intact.     Motor: Motor function is intact. No weakness or abnormal muscle tone.     Coordination: Coordination is intact.  Psychiatric:        Mood and Affect: Mood normal.        Speech: Speech normal.        Behavior: Behavior normal.     ED Results / Procedures / Treatments   Labs (all labs ordered are listed, but only abnormal results are displayed) Labs Reviewed  COMPREHENSIVE METABOLIC PANEL - Abnormal; Notable for the following components:      Result Value   CO2 18 (*)    Glucose, Bld 293 (*)    Total Protein 5.9 (*)    GFR, Estimated 60 (*)    All  other components within normal limits  CBC - Abnormal; Notable for the following components:   WBC 15.5 (*)    Hemoglobin 12.6 (*)    MCH 25.4 (*)    RDW 17.3 (*)    All other components within normal limits  POC OCCULT BLOOD, ED - Abnormal; Notable for the  following components:   Fecal Occult Bld POSITIVE (*)    All other components within normal limits  TYPE AND SCREEN    EKG EKG Interpretation  Date/Time:  Sunday Dec 18 2022 23:03:31 EDT Ventricular Rate:  79 PR Interval:  158 QRS Duration: 90 QT Interval:  388 QTC Calculation: 444 R Axis:   -19 Text Interpretation: Sinus rhythm with frequent and consecutive Premature ventricular complexes Minimal voltage criteria for LVH, may be normal variant ( R in aVL ) Nonspecific ST abnormality Abnormal ECG When compared with ECG of 28-Mar-2022 08:04, PREVIOUS ECG IS PRESENT Confirmed by Gilda Crease [phone]) on 12/19/2022 12:14:28 AM  Radiology CT ANGIO GI BLEED  Result Date: 12/19/2022 CLINICAL DATA:  Gastrointestinal hemorrhage EXAM: CTA ABDOMEN AND PELVIS WITHOUT AND WITH CONTRAST TECHNIQUE: Multidetector CT imaging of the abdomen and pelvis was performed using the standard protocol during bolus administration of intravenous contrast. Multiplanar reconstructed images and MIPs were obtained and reviewed to evaluate the vascular anatomy. RADIATION DOSE REDUCTION: This exam was performed according to the departmental dose-optimization program which includes automated exposure control, adjustment of the mA and/or kV according to patient size and/or use of iterative reconstruction technique. CONTRAST:  OMNIPAQUE IOHEXOL 350 MG/ML SOLN COMPARISON:  03/28/2022 FINDINGS: VASCULAR Aorta: Normal caliber aorta without aneurysm, dissection, vasculitis or significant stenosis. Moderate atherosclerotic calcification Celiac: Patent without evidence of aneurysm, dissection, vasculitis or significant stenosis. SMA: Patent without evidence of  aneurysm, dissection, vasculitis or significant stenosis. Renals: Both renal arteries are patent without evidence of aneurysm, dissection, vasculitis, fibromuscular dysplasia or significant stenosis. IMA: Patent without evidence of aneurysm, dissection, vasculitis or significant stenosis. Inflow: Patent without evidence of aneurysm, dissection, vasculitis or significant stenosis. Proximal Outflow: Bilateral common femoral and visualized portions of the superficial and profunda femoral arteries are patent without evidence of aneurysm, dissection, vasculitis or significant stenosis. Veins: Unremarkable. In particular, the inferior and superior mesenteric veins and portal vein are patent. Review of the MIP images confirms the above findings. NON-VASCULAR Lower chest: Mild bibasilar cylindrical bronchiectasis. Extensive coronary artery calcification. Stable mild cardiomegaly. Hepatobiliary: Tiny cyst within the lateral segment of the left hepatic lobe. Liver otherwise unremarkable. Cholelithiasis without pericholecystic inflammatory change. No intra or extrahepatic biliary ductal dilation. Pancreas: Unremarkable Spleen: Unremarkable Adrenals/Urinary Tract: The adrenal glands are unremarkable. The kidneys are normal in size and position. Scattered cortical hypodensities are seen within the kidneys bilaterally which are too small to accurately characterize but likely represent multiple tiny cortical cysts. No follow-up imaging is recommended for these lesions. The kidneys are otherwise unremarkable. The bladder is trabeculated and there are innumerable small diverticula identified in keeping with changes of chronic bladder outlet obstruction. The bladder is not distended. Stomach/Bowel: No active gastrointestinal hemorrhage is identified. There is severe descending and sigmoid colonic diverticulosis. There is superimposed focal pericolonic inflammatory change involving the proximal sigmoid colon best seen on axial image #  158/17 in keeping with mild acute uncomplicated sigmoid diverticulitis. There is no evidence of obstruction or perforation. No loculated pericolonic fluid collections are identified. The stomach, small bowel, and large bowel are otherwise unremarkable. Appendix normal. No free intraperitoneal gas or fluid. Lymphatic: No pathologic adenopathy within the abdomen and pelvis. Reproductive: Marked prostatic enlargement. Other: Small fat containing left inguinal hernia Musculoskeletal: Degenerative changes are seen throughout the lumbar spine. No acute bone abnormality. Osseous structures are diffusely osteopenic. IMPRESSION: 1. No active gastrointestinal hemorrhage identified. 2. Extensive coronary artery calcification. Stable mild cardiomegaly. 3. Cholelithiasis. 4. Mild acute uncomplicated sigmoid  diverticulitis. Severe superimposed distal colonic diverticulosis. 5. Marked prostatic enlargement with evidence of chronic bladder outlet obstruction. Aortic Atherosclerosis (ICD10-I70.0). Electronically Signed   By: Helyn Numbers M.D.   On: 12/19/2022 02:19    Procedures Procedures    Medications Ordered in ED Medications  ampicillin-sulbactam (UNASYN) 1.5 g in sodium chloride 0.9 % 100 mL IVPB (has no administration in time range)  lactated ringers bolus 1,000 mL (1,000 mLs Intravenous New Bag/Given 12/19/22 0206)  iohexol (OMNIPAQUE) 350 MG/ML injection 100 mL (100 mLs Intravenous Contrast Given 12/19/22 0157)    ED Course/ Medical Decision Making/ A&P                             Medical Decision Making Amount and/or Complexity of Data Reviewed Labs: ordered. Radiology: ordered.  Risk Prescription drug management.   Differential diagnosis considered includes, but not limited to:  Diverticulitis; colitis; AVM; diverticular bleed;  Patient presents to the emergency department for evaluation of rectal bleeding.  Patient has had multiple episodes of significant red blood per rectum since this  evening.  He is feeling lightheaded, like he is going to pass out.  Patient pale on exam.  Blood pressure initially normal, started to trend down, IV fluids initiated.  Initial hemoglobin 12.6.  I suspect that this will trend downwards.  Records have been reviewed and he has had prior bleeds that were diverticular in nature.  Most recent colonoscopy from 2023 showed significant diverticulosis throughout the entire colon.  Patient does have some mild diffuse lower abdominal pain and tenderness on exam.  He underwent CT angio bleeding study.  No active bleeding noted but he does have acute diverticulitis.  Will administer IV Unasyn and admit patient for monitoring.  He was hypotensive initially but this has resolved with IV fluids.  Will require frequent H&H to ensure that he is not becoming anemic and need transfusion.        Final Clinical Impression(s) / ED Diagnoses Final diagnoses:  Lower GI bleed  Diverticulitis    Rx / DC Orders ED Discharge Orders     None         Quana Chamberlain, Canary Brim, MD 12/19/22 [phone]

## 2022-12-20 DIAGNOSIS — K5792 Diverticulitis of intestine, part unspecified, without perforation or abscess without bleeding: Secondary | ICD-10-CM | POA: Diagnosis not present

## 2022-12-20 DIAGNOSIS — I10 Essential (primary) hypertension: Secondary | ICD-10-CM

## 2022-12-20 DIAGNOSIS — K625 Hemorrhage of anus and rectum: Secondary | ICD-10-CM | POA: Diagnosis not present

## 2022-12-20 LAB — GLUCOSE, CAPILLARY
Glucose-Capillary: 190 mg/dL — ABNORMAL HIGH (ref 70–99)
Glucose-Capillary: 139 mg/dL — ABNORMAL HIGH (ref 70–99)
Glucose-Capillary: 199 mg/dL — ABNORMAL HIGH (ref 70–99)
Glucose-Capillary: 152 mg/dL — ABNORMAL HIGH (ref 70–99)

## 2022-12-20 LAB — CULTURE, BLOOD (ROUTINE X 2): Special Requests: ADEQUATE

## 2022-12-20 LAB — CBC WITH DIFFERENTIAL/PLATELET
Abs Immature Granulocytes: 0.03 10*3/uL (ref 0.00–0.07)
Basophils Absolute: 0.1 10*3/uL (ref 0.0–0.1)
Basophils Relative: 1 %
Eosinophils Absolute: 0.1 10*3/uL (ref 0.0–0.5)
Eosinophils Relative: 1 %
HCT: 30.6 % — ABNORMAL LOW (ref 39.0–52.0)
Hemoglobin: 10 g/dL — ABNORMAL LOW (ref 13.0–17.0)
Immature Granulocytes: 0 %
Lymphocytes Relative: 24 %
Lymphs Abs: 1.8 10*3/uL (ref 0.7–4.0)
MCH: 25.5 pg — ABNORMAL LOW (ref 26.0–34.0)
MCHC: 32.7 g/dL (ref 30.0–36.0)
MCV: 78.1 fL — ABNORMAL LOW (ref 80.0–100.0)
Monocytes Absolute: 0.5 10*3/uL (ref 0.1–1.0)
Monocytes Relative: 7 %
Neutro Abs: 5.1 10*3/uL (ref 1.7–7.7)
Neutrophils Relative %: 67 %
Platelets: 269 10*3/uL (ref 150–400)
RBC: 3.92 MIL/uL — ABNORMAL LOW (ref 4.22–5.81)
RDW: 17.2 % — ABNORMAL HIGH (ref 11.5–15.5)
WBC: 7.6 10*3/uL (ref 4.0–10.5)
nRBC: 0 % (ref 0.0–0.2)

## 2022-12-20 LAB — BASIC METABOLIC PANEL
Anion gap: 6 (ref 5–15)
BUN: 8 mg/dL (ref 8–23)
CO2: 22 mmol/L (ref 22–32)
Calcium: 8.3 mg/dL — ABNORMAL LOW (ref 8.9–10.3)
Chloride: 107 mmol/L (ref 98–111)
Creatinine, Ser: 0.87 mg/dL (ref 0.61–1.24)
GFR, Estimated: >60 mL/min (ref >60)
Glucose, Bld: 149 mg/dL — ABNORMAL HIGH (ref 70–99)
Potassium: 3.6 mmol/L (ref 3.5–5.1)
Sodium: 135 mmol/L (ref 135–145)

## 2022-12-20 MED ORDER — METOPROLOL SUCCINATE ER 25 MG PO TB24
25.0000 mg | ORAL_TABLET | Freq: Every day | ORAL | Status: DC
  Administered 2022-12-20 – 2022-12-21 (×2): 25 mg via ORAL
  Filled 2022-12-20 (×2): qty 1

## 2022-12-20 MED ORDER — PANTOPRAZOLE SODIUM 40 MG PO TBEC
40.0000 mg | DELAYED_RELEASE_TABLET | Freq: Every day | ORAL | Status: DC
  Administered 2022-12-21: 40 mg via ORAL
  Filled 2022-12-20: qty 1

## 2022-12-20 MED ORDER — FINASTERIDE 5 MG PO TABS
5.0000 mg | ORAL_TABLET | Freq: Every day | ORAL | Status: DC
  Administered 2022-12-20 – 2022-12-21 (×2): 5 mg via ORAL
  Filled 2022-12-20 (×2): qty 1

## 2022-12-20 MED ORDER — LOSARTAN POTASSIUM 50 MG PO TABS
50.0000 mg | ORAL_TABLET | Freq: Every day | ORAL | Status: DC
  Administered 2022-12-20 – 2022-12-21 (×2): 50 mg via ORAL
  Filled 2022-12-20 (×2): qty 1

## 2022-12-20 MED ORDER — DORZOLAMIDE HCL 2 % OP SOLN
1.0000 [drp] | Freq: Two times a day (BID) | OPHTHALMIC | Status: DC
  Administered 2022-12-20 – 2022-12-21 (×2): 1 [drp] via OPHTHALMIC
  Filled 2022-12-20: qty 10

## 2022-12-20 MED ORDER — ACYCLOVIR 400 MG PO TABS
800.0000 mg | ORAL_TABLET | Freq: Every day | ORAL | Status: DC
  Administered 2022-12-21: 800 mg via ORAL
  Filled 2022-12-20: qty 2

## 2022-12-20 NOTE — Progress Notes (Signed)
Progress Note   Subjective  No further bleeding overnight or all day today. No bowel movements. Ate lunch, tolerated it well. Denies pain.   Objective   Vital signs in last 24 hours: Temp:  [97.5 F (36.4 C)-97.9 F (36.6 C)] 97.5 F (36.4 C) (05/21 1642) Pulse Rate:  [46-91] 91 (05/21 1642) Resp:  [16-17] 17 (05/21 1642) BP: (124-165)/(57-97) 124/76 (05/21 1642) SpO2:  [97 %-100 %] 97 % (05/21 1642) Last BM Date : 12/19/22 General:    white male in NAD Abdomen:  Soft, nontender and nondistended. Normal bowel sounds.  Intake/Output from previous day: 05/20 0701 - 05/21 0700 In: 300 [IV Piggyback:300] Out: -  Intake/Output this shift: Total I/O In: 920 [P.O.:720; IV Piggyback:200] Out: 450 [Urine:450]  Lab Results: Recent Labs    12/18/22 2320 12/19/22 0500 12/19/22 0959 12/19/22 1649 12/20/22 0303  WBC 15.5* 10.6*  --   --  7.6  HGB 12.6* 10.6* 10.6* 10.2* 10.0*  HCT 40.5 33.8* 33.8* 31.9* 30.6*  PLT 383 271  --   --  269   BMET Recent Labs    12/18/22 2320 12/19/22 0620 12/20/22 0303  NA 136 135 135  K 4.6 4.4 3.6  CL 108 107 107  CO2 18* 21* 22  GLUCOSE 293* 140* 149*  BUN 18 17 8   CREATININE 1.16 0.92 0.87  CALCIUM 8.9 8.5* 8.3*   LFT Recent Labs    12/18/22 2320  PROT 5.9*  ALBUMIN 3.5  AST 20  ALT 18  ALKPHOS 60  BILITOT 0.7   PT/INR No results for input(s): "LABPROT", "INR" in the last 72 hours.  Studies/Results: CT ANGIO GI BLEED  Result Date: 12/19/2022 CLINICAL DATA:  Gastrointestinal hemorrhage EXAM: CTA ABDOMEN AND PELVIS WITHOUT AND WITH CONTRAST TECHNIQUE: Multidetector CT imaging of the abdomen and pelvis was performed using the standard protocol during bolus administration of intravenous contrast. Multiplanar reconstructed images and MIPs were obtained and reviewed to evaluate the vascular anatomy. RADIATION DOSE REDUCTION: This exam was performed according to the departmental dose-optimization program which includes  automated exposure control, adjustment of the mA and/or kV according to patient size and/or use of iterative reconstruction technique. CONTRAST:  OMNIPAQUE IOHEXOL 350 MG/ML SOLN COMPARISON:  03/28/2022 FINDINGS: VASCULAR Aorta: Normal caliber aorta without aneurysm, dissection, vasculitis or significant stenosis. Moderate atherosclerotic calcification Celiac: Patent without evidence of aneurysm, dissection, vasculitis or significant stenosis. SMA: Patent without evidence of aneurysm, dissection, vasculitis or significant stenosis. Renals: Both renal arteries are patent without evidence of aneurysm, dissection, vasculitis, fibromuscular dysplasia or significant stenosis. IMA: Patent without evidence of aneurysm, dissection, vasculitis or significant stenosis. Inflow: Patent without evidence of aneurysm, dissection, vasculitis or significant stenosis. Proximal Outflow: Bilateral common femoral and visualized portions of the superficial and profunda femoral arteries are patent without evidence of aneurysm, dissection, vasculitis or significant stenosis. Veins: Unremarkable. In particular, the inferior and superior mesenteric veins and portal vein are patent. Review of the MIP images confirms the above findings. NON-VASCULAR Lower chest: Mild bibasilar cylindrical bronchiectasis. Extensive coronary artery calcification. Stable mild cardiomegaly. Hepatobiliary: Tiny cyst within the lateral segment of the left hepatic lobe. Liver otherwise unremarkable. Cholelithiasis without pericholecystic inflammatory change. No intra or extrahepatic biliary ductal dilation. Pancreas: Unremarkable Spleen: Unremarkable Adrenals/Urinary Tract: The adrenal glands are unremarkable. The kidneys are normal in size and position. Scattered cortical hypodensities are seen within the kidneys bilaterally which are too small to accurately characterize but likely represent multiple tiny cortical cysts. No follow-up  imaging is recommended for  these lesions. The kidneys are otherwise unremarkable. The bladder is trabeculated and there are innumerable small diverticula identified in keeping with changes of chronic bladder outlet obstruction. The bladder is not distended. Stomach/Bowel: No active gastrointestinal hemorrhage is identified. There is severe descending and sigmoid colonic diverticulosis. There is superimposed focal pericolonic inflammatory change involving the proximal sigmoid colon best seen on axial image # 158/17 in keeping with mild acute uncomplicated sigmoid diverticulitis. There is no evidence of obstruction or perforation. No loculated pericolonic fluid collections are identified. The stomach, small bowel, and large bowel are otherwise unremarkable. Appendix normal. No free intraperitoneal gas or fluid. Lymphatic: No pathologic adenopathy within the abdomen and pelvis. Reproductive: Marked prostatic enlargement. Other: Small fat containing left inguinal hernia Musculoskeletal: Degenerative changes are seen throughout the lumbar spine. No acute bone abnormality. Osseous structures are diffusely osteopenic. IMPRESSION: 1. No active gastrointestinal hemorrhage identified. 2. Extensive coronary artery calcification. Stable mild cardiomegaly. 3. Cholelithiasis. 4. Mild acute uncomplicated sigmoid diverticulitis. Severe superimposed distal colonic diverticulosis. 5. Marked prostatic enlargement with evidence of chronic bladder outlet obstruction. Aortic Atherosclerosis (ICD10-I70.0). Electronically Signed   By: Helyn Numbers M.D.   On: 12/19/2022 02:19       Assessment / Plan:     89 y/o male here with the following:  Lower GI bleed - likely due to diverticulosis Diverticulitis  Doing better today, no further bleeding. Hgb stable in the 10s. Okay to advance diet to regular. Continue antibiotics for diverticulitis. If he does well can go home tomorrow AM. Would transition to oral antibiotics tomorrow AM and complete another 5  days. He understands risks of recurrent bleeding with this, he has had it in the past. Full colonoscopy done in 2023, I do not think he needs a repeat exam for this, especially at his age, without any high risk polyps and extensive diverticulosis on his last exam.  We will sign off for now, call with questions moving forward.  Harlin Rain, MD [facility]

## 2022-12-20 NOTE — TOC CM/SW Note (Signed)
  Transition of Care [facility]) Screening Note   Patient Details  Name: John Gamble Date of Birth: [birth year]    Transition of Care Department [facility]) has reviewed patient and no TOC needs have been identified at this time. We will continue to monitor patient advancement through interdisciplinary progression rounds. If new patient transition needs arise, please place a TOC consult.

## 2022-12-20 NOTE — Evaluation (Signed)
Physical Therapy Evaluation Patient Details Name: John Gamble MRN: [id number] DOB: Jan 30, 1931 Today's Date: 12/20/2022  History of Present Illness  John Gamble is a 89 y.o. male with medical history significant for lower GI bleed from diverticular disease, coronary artery disease status post PCI with stenting on low dose ASA , hypertension, hyperlipidemia, type 2 diabetes, history of mild reflux esophagitis, mild erosive gastroduodenitis, seen on EGD done on 08/04/2021.  Who presents to [facility] ED from home due to rectal bleeding, bright red blood per rectum, today.  Associated with peri-umbilical abdominal cramping.  Clinical Impression  Patient received in bed, he is pleasant and agrees to PT assessment. Patient is mod I with bed mobility. Transfers with min guard and cues for safety. He is able to ambulate 200 feet with RW and min guard. Slightly decreased cadence and did not feel comfortable attempting gait without AD this session. He does not use AD at baseline, but has RW at home if needed). Patient will continue to benefit from skilled PT to improve strength, safety and  independence.        Recommendations for follow up therapy are one component of a multi-disciplinary discharge planning process, led by the attending physician.  Recommendations may be updated based on patient status, additional functional criteria and insurance authorization.  Follow Up Recommendations       Assistance Recommended at Discharge PRN  Patient can return home with the following  Assist for transportation;Help with stairs or ramp for entrance;A little help with walking and/or transfers;A little help with bathing/dressing/bathroom    Equipment Recommendations None recommended by PT  Recommendations for Other Services       Functional Status Assessment Patient has had a recent decline in their functional status and demonstrates the ability to make significant improvements in function in a reasonable and  predictable amount of time.     Precautions / Restrictions Precautions Precautions: Fall Restrictions Weight Bearing Restrictions: No      Mobility  Bed Mobility Overal bed mobility: Modified Independent                  Transfers Overall transfer level: Needs assistance Equipment used: Rolling walker (2 wheels) Transfers: Sit to/from Stand Sit to Stand: Min guard                Ambulation/Gait Ambulation/Gait assistance: Min guard Gait Distance (Feet): 200 Feet Assistive device: Rolling walker (2 wheels) Gait Pattern/deviations: Step-through pattern Gait velocity: slightly decr     General Gait Details: no difficulty noted, patient feels more confident with walker at this time, although he does not use AD at baseline  Stairs            Wheelchair Mobility    Modified Rankin (Stroke Patients Only)       Balance Overall balance assessment: Mild deficits observed, not formally tested                                           Pertinent Vitals/Pain Pain Assessment Pain Assessment: No/denies pain    Home Living Family/patient expects to be discharged to:: Private residence Living Arrangements: Spouse/significant other Available Help at Discharge: Family;Available 24 hours/day Type of Home: House Home Access: Stairs to enter Entrance Stairs-Rails: Right Entrance Stairs-Number of Steps: 1   Home Layout: One level Home Equipment: Agricultural consultant (2 wheels);Cane - single point  Prior Function Prior Level of Function : Independent/Modified Independent;Driving             Mobility Comments: no AD though family have been suggesting pt to use cane d/t baseline balance deficits ADLs Comments: indep, drives, goes to the gym     Hand Dominance   Dominant Hand: Right    Extremity/Trunk Assessment   Upper Extremity Assessment Upper Extremity Assessment: Overall WFL for tasks assessed    Lower Extremity  Assessment Lower Extremity Assessment: Overall WFL for tasks assessed    Cervical / Trunk Assessment Cervical / Trunk Assessment: Normal  Communication   Communication: HOH  Cognition Arousal/Alertness: Awake/alert Behavior During Therapy: WFL for tasks assessed/performed Overall Cognitive Status: Within Functional Limits for tasks assessed                                          General Comments      Exercises     Assessment/Plan    PT Assessment Patient needs continued PT services  PT Problem List Decreased strength;Decreased balance;Decreased mobility       PT Treatment Interventions DME instruction;Therapeutic exercise;Gait training;Stair training;Functional mobility training;Therapeutic activities;Patient/family education;Balance training    PT Goals (Current goals can be found in the Care Plan section)  Acute Rehab PT Goals Patient Stated Goal: to return home PT Goal Formulation: With patient Time For Goal Achievement: 12/27/22 Potential to Achieve Goals: Good    Frequency Min 2X/week     Co-evaluation               AM-PAC PT "6 Clicks" Mobility  Outcome Measure Help needed turning from your back to your side while in a flat bed without using bedrails?: None Help needed moving from lying on your back to sitting on the side of a flat bed without using bedrails?: None Help needed moving to and from a bed to a chair (including a wheelchair)?: A Little Help needed standing up from a chair using your arms (e.g., wheelchair or bedside chair)?: A Little Help needed to walk in hospital room?: A Little Help needed climbing 3-5 steps with a railing? : A Little 6 Click Score: 20    End of Session Equipment Utilized During Treatment: Gait belt Activity Tolerance: Patient tolerated treatment well Patient left: in bed;with call bell/phone within reach Nurse Communication: Mobility status PT Visit Diagnosis: Muscle weakness (generalized)  (M62.81);Difficulty in walking, not elsewhere classified (R26.2)    Time: 1610-9604 PT Time Calculation (min) (ACUTE ONLY): 26 min   Charges:   PT Evaluation $PT Eval Low Complexity: 1 Low PT Treatments $Gait Training: 8-22 mins        Zyen Triggs, PT, GCS 12/20/22,2:28 PM

## 2022-12-20 NOTE — Progress Notes (Signed)
[facility]                                                                               [name], is a 89 y.o. male, DOB - [birth year], ZOX:[id number] Admit date - 12/18/2022    Outpatient Primary MD for the patient is Dough, John Cheadle, MD  LOS - 1  days    Brief summary   John Gamble is a 89 y.o. male with medical history significant for lower GI bleed from diverticular disease, coronary artery disease status post PCI with stenting on low dose ASA , hypertension, hyperlipidemia, type 2 diabetes, history of mild reflux esophagitis, mild erosive gastroduodenitis, seen on EGD done on 08/04/2021.  Who presents to [facility] ED from home due to rectal bleeding, bright red blood per rectum,. He was admitted for acute diverticulitis and rectal bleeding.  He was started on IV Unasyn and GI consulted.   Assessment & Plan    Assessment and Plan:  Rectal bleeding possibly from diverticular disease:  Appears to have improved.  Hemoglobin stabilized around 10.  CT angio shows No active gastrointestinal hemorrhage identified.     Acute diverticulitis of the sigmoid colon/ sepsis Present on admission. Leukocytosis resolved.  Started her on IV Unasyn.   Blood cultures are negative so far.  GI consulted for recommendations.    CAD s/p PCI On aspirin , bb, which are hold for now.  Restarted metoprolol 25 mg Daily.   Non anion gap metabolic acidosis:  - resolved.    Type 2 DM; Non insulin dependent.  A1c  is 6.7%  Chronic combined diastolic and systolic CHF Echo showed LvEF of 45 to 50% with grade 1 diastolic dysfunction.    Therapy evaluations ordered.     Estimated body mass index is 25.5 kg/m as calculated from the following:   Height as of this encounter: 6' (1.829 m).   Weight as of this encounter: 85.3 kg.  Code Status: full code.  DVT Prophylaxis:  SCDs Start: 12/19/22 0327   Level of Care: Level of care: Med-Surg Family Communication: none at  bedside.   Disposition Plan:     Remains inpatient appropriate:  possible d/c in am if able to tolerate a soft diet.   Procedures:  None   Consultants:   Gastroenterology.   Antimicrobials:   Anti-infectives (From admission, onward)    Start     Dose/Rate Route Frequency Ordered Stop   12/19/22 0800  Ampicillin-Sulbactam (UNASYN) 3 g in sodium chloride 0.9 % 100 mL IVPB        3 g 200 mL/hr over 30 Minutes Intravenous Every 8 hours 12/19/22 0506     12/19/22 0230  ampicillin-sulbactam (UNASYN) 1.5 g in sodium chloride 0.9 % 100 mL IVPB        1.5 g 200 mL/hr over 30 Minutes Intravenous  Once 12/19/22 0229 12/19/22 0355        Medications  Scheduled Meds:  insulin aspart  0-5 Units Subcutaneous QHS   insulin aspart  0-9 Units Subcutaneous TID WC   Continuous Infusions:  ampicillin-sulbactam (UNASYN) IV 3 g (12/20/22 0847)   PRN Meds:.acetaminophen, melatonin, prochlorperazine  Subjective:   Zacharay Dragone was seen and examined today.  No rectal bleeding today. Advanced diet to soft diet.   Objective:   Vitals:   12/19/22 2028 12/20/22 0029 12/20/22 0455 12/20/22 0906  BP: (!) 143/78 (!) 145/57 (!) 165/76 (!) 154/67  Pulse: 82 61 84 (!) 46  Resp: 17 16 17 17   Temp: 97.7 F (36.5 C) 97.9 F (36.6 C) 97.7 F (36.5 C) (!) 97.5 F (36.4 C)  TempSrc: Oral Oral Oral Oral  SpO2: 97% 99% 100% 97%  Weight:      Height:        Intake/Output Summary (Last 24 hours) at 12/20/2022 1406 Last data filed at 12/20/2022 0945 Gross per 24 hour  Intake 680 ml  Output 450 ml  Net 230 ml   Filed Weights   12/19/22 0807  Weight: 85.3 kg     Exam General exam: Appears calm and comfortable  Respiratory system: Clear to auscultation. Respiratory effort normal. Cardiovascular system: S1 & S2 heard, RRR. No JVD ,No pedal edema. Gastrointestinal system: Abdomen is nondistended, soft and nontender.  Central nervous system: Alert and oriented. No focal neurological  deficits. Extremities: Symmetric 5 x 5 power. Skin: No rashes, lesions or ulcers Psychiatry:  Mood & affect appropriate.    Data Reviewed:  I have personally reviewed following labs and imaging studies   CBC Lab Results  Component Value Date   WBC 7.6 12/20/2022   RBC 3.92 (L) 12/20/2022   HGB 10.0 (L) 12/20/2022   HCT 30.6 (L) 12/20/2022   MCV 78.1 (L) 12/20/2022   MCH 25.5 (L) 12/20/2022   PLT 269 12/20/2022   MCHC 32.7 12/20/2022   RDW 17.2 (H) 12/20/2022   LYMPHSABS 1.8 12/20/2022   MONOABS 0.5 12/20/2022   EOSABS 0.1 12/20/2022   BASOSABS 0.1 12/20/2022     Last metabolic panel Lab Results  Component Value Date   NA 135 12/20/2022   K 3.6 12/20/2022   CL 107 12/20/2022   CO2 22 12/20/2022   BUN 8 12/20/2022   CREATININE 0.87 12/20/2022   GLUCOSE 149 (H) 12/20/2022   GFRNONAA >60 12/20/2022   GFRAA 79 (L) 09/20/2014   CALCIUM 8.3 (L) 12/20/2022   PHOS 3.0 12/19/2022   PROT 5.9 (L) 12/18/2022   ALBUMIN 3.5 12/18/2022   BILITOT 0.7 12/18/2022   ALKPHOS 60 12/18/2022   AST 20 12/18/2022   ALT 18 12/18/2022   ANIONGAP 6 12/20/2022    CBG (last 3)  Recent Labs    12/19/22 2121 12/20/22 0851 12/20/22 1201  GLUCAP 141* 190* 139*      Coagulation Profile: No results for input(s): "INR", "PROTIME" in the last 168 hours.   Radiology Studies: CT ANGIO GI BLEED  Result Date: 12/19/2022 CLINICAL DATA:  Gastrointestinal hemorrhage EXAM: CTA ABDOMEN AND PELVIS WITHOUT AND WITH CONTRAST TECHNIQUE: Multidetector CT imaging of the abdomen and pelvis was performed using the standard protocol during bolus administration of intravenous contrast. Multiplanar reconstructed images and MIPs were obtained and reviewed to evaluate the vascular anatomy. RADIATION DOSE REDUCTION: This exam was performed according to the departmental dose-optimization program which includes automated exposure control, adjustment of the mA and/or kV according to patient size and/or use of  iterative reconstruction technique. CONTRAST:  OMNIPAQUE IOHEXOL 350 MG/ML SOLN COMPARISON:  03/28/2022 FINDINGS: VASCULAR Aorta: Normal caliber aorta without aneurysm, dissection, vasculitis or significant stenosis. Moderate atherosclerotic calcification Celiac: Patent without evidence of aneurysm, dissection, vasculitis or significant stenosis. SMA: Patent without evidence of aneurysm,  dissection, vasculitis or significant stenosis. Renals: Both renal arteries are patent without evidence of aneurysm, dissection, vasculitis, fibromuscular dysplasia or significant stenosis. IMA: Patent without evidence of aneurysm, dissection, vasculitis or significant stenosis. Inflow: Patent without evidence of aneurysm, dissection, vasculitis or significant stenosis. Proximal Outflow: Bilateral common femoral and visualized portions of the superficial and profunda femoral arteries are patent without evidence of aneurysm, dissection, vasculitis or significant stenosis. Veins: Unremarkable. In particular, the inferior and superior mesenteric veins and portal vein are patent. Review of the MIP images confirms the above findings. NON-VASCULAR Lower chest: Mild bibasilar cylindrical bronchiectasis. Extensive coronary artery calcification. Stable mild cardiomegaly. Hepatobiliary: Tiny cyst within the lateral segment of the left hepatic lobe. Liver otherwise unremarkable. Cholelithiasis without pericholecystic inflammatory change. No intra or extrahepatic biliary ductal dilation. Pancreas: Unremarkable Spleen: Unremarkable Adrenals/Urinary Tract: The adrenal glands are unremarkable. The kidneys are normal in size and position. Scattered cortical hypodensities are seen within the kidneys bilaterally which are too small to accurately characterize but likely represent multiple tiny cortical cysts. No follow-up imaging is recommended for these lesions. The kidneys are otherwise unremarkable. The bladder is trabeculated and there are  innumerable small diverticula identified in keeping with changes of chronic bladder outlet obstruction. The bladder is not distended. Stomach/Bowel: No active gastrointestinal hemorrhage is identified. There is severe descending and sigmoid colonic diverticulosis. There is superimposed focal pericolonic inflammatory change involving the proximal sigmoid colon best seen on axial image # 158/17 in keeping with mild acute uncomplicated sigmoid diverticulitis. There is no evidence of obstruction or perforation. No loculated pericolonic fluid collections are identified. The stomach, small bowel, and large bowel are otherwise unremarkable. Appendix normal. No free intraperitoneal gas or fluid. Lymphatic: No pathologic adenopathy within the abdomen and pelvis. Reproductive: Marked prostatic enlargement. Other: Small fat containing left inguinal hernia Musculoskeletal: Degenerative changes are seen throughout the lumbar spine. No acute bone abnormality. Osseous structures are diffusely osteopenic. IMPRESSION: 1. No active gastrointestinal hemorrhage identified. 2. Extensive coronary artery calcification. Stable mild cardiomegaly. 3. Cholelithiasis. 4. Mild acute uncomplicated sigmoid diverticulitis. Severe superimposed distal colonic diverticulosis. 5. Marked prostatic enlargement with evidence of chronic bladder outlet obstruction. Aortic Atherosclerosis (ICD10-I70.0). Electronically Signed   By: Helyn Numbers M.D.   On: 12/19/2022 02:19       Kathlen Mody M.D. [facility] 12/20/2022, 2:06 PM  Available via Epic secure chat 7am-7pm After 7 pm, please refer to night coverage provider listed on amion.

## 2022-12-21 ENCOUNTER — Other Ambulatory Visit (HOSPITAL_COMMUNITY): Payer: Self-pay

## 2022-12-21 DIAGNOSIS — Z9861 Coronary angioplasty status: Secondary | ICD-10-CM

## 2022-12-21 DIAGNOSIS — I2510 Atherosclerotic heart disease of native coronary artery without angina pectoris: Secondary | ICD-10-CM | POA: Diagnosis not present

## 2022-12-21 DIAGNOSIS — E119 Type 2 diabetes mellitus without complications: Secondary | ICD-10-CM

## 2022-12-21 DIAGNOSIS — K5792 Diverticulitis of intestine, part unspecified, without perforation or abscess without bleeding: Secondary | ICD-10-CM

## 2022-12-21 DIAGNOSIS — I5042 Chronic combined systolic (congestive) and diastolic (congestive) heart failure: Secondary | ICD-10-CM

## 2022-12-21 DIAGNOSIS — K625 Hemorrhage of anus and rectum: Secondary | ICD-10-CM | POA: Diagnosis not present

## 2022-12-21 LAB — CBC WITH DIFFERENTIAL/PLATELET
Abs Immature Granulocytes: 0.03 10*3/uL (ref 0.00–0.07)
Basophils Absolute: 0.1 10*3/uL (ref 0.0–0.1)
Basophils Relative: 1 %
Eosinophils Absolute: 0.1 10*3/uL (ref 0.0–0.5)
Eosinophils Relative: 1 %
HCT: 35.7 % — ABNORMAL LOW (ref 39.0–52.0)
Hemoglobin: 11.4 g/dL — ABNORMAL LOW (ref 13.0–17.0)
Immature Granulocytes: 0 %
Lymphocytes Relative: 23 %
Lymphs Abs: 1.8 10*3/uL (ref 0.7–4.0)
MCH: 25.7 pg — ABNORMAL LOW (ref 26.0–34.0)
MCHC: 31.9 g/dL (ref 30.0–36.0)
MCV: 80.6 fL (ref 80.0–100.0)
Monocytes Absolute: 0.5 10*3/uL (ref 0.1–1.0)
Monocytes Relative: 6 %
Neutro Abs: 5.6 10*3/uL (ref 1.7–7.7)
Neutrophils Relative %: 69 %
Platelets: 335 10*3/uL (ref 150–400)
RBC: 4.43 MIL/uL (ref 4.22–5.81)
RDW: 17.2 % — ABNORMAL HIGH (ref 11.5–15.5)
WBC: 8.1 10*3/uL (ref 4.0–10.5)
nRBC: 0 % (ref 0.0–0.2)

## 2022-12-21 LAB — GLUCOSE, CAPILLARY
Glucose-Capillary: 161 mg/dL — ABNORMAL HIGH (ref 70–99)
Glucose-Capillary: 123 mg/dL — ABNORMAL HIGH (ref 70–99)

## 2022-12-21 LAB — BASIC METABOLIC PANEL
Anion gap: 10 (ref 5–15)
BUN: 11 mg/dL (ref 8–23)
CO2: 25 mmol/L (ref 22–32)
Calcium: 9 mg/dL (ref 8.9–10.3)
Chloride: 101 mmol/L (ref 98–111)
Creatinine, Ser: 0.99 mg/dL (ref 0.61–1.24)
GFR, Estimated: >60 mL/min (ref >60)
Glucose, Bld: 144 mg/dL — ABNORMAL HIGH (ref 70–99)
Potassium: 3.7 mmol/L (ref 3.5–5.1)
Sodium: 136 mmol/L (ref 135–145)

## 2022-12-21 LAB — MAGNESIUM: Magnesium: 2.1 mg/dL (ref 1.7–2.4)

## 2022-12-21 MED ORDER — AMOXICILLIN-POT CLAVULANATE 875-125 MG PO TABS
1.0000 | ORAL_TABLET | Freq: Two times a day (BID) | ORAL | 0 refills | Status: AC
  Filled 2022-12-21: qty 10, 5d supply, fill #0

## 2022-12-21 MED ORDER — ASPIRIN EC 81 MG PO TBEC: 81.0000 mg | DELAYED_RELEASE_TABLET | Freq: Every day | ORAL | 11 refills | Status: AC

## 2022-12-21 MED ORDER — AMOXICILLIN-POT CLAVULANATE 875-125 MG PO TABS
1.0000 | ORAL_TABLET | Freq: Two times a day (BID) | ORAL | Status: DC
  Administered 2022-12-21: 1 via ORAL
  Filled 2022-12-21: qty 1

## 2022-12-21 NOTE — Discharge Instructions (Signed)
Low Fiber Nutrition Therapy   You may need a low-fiber diet if you have Crohn's disease, diverticulitis, gastroparesis, ulcerative colitis, a new colostomy, or new ileostomy. A low-fiber diet may also be needed following radiation therapy to the pelvis and lower bowel or recent intestinal surgery.  A low-fiber diet reduces the frequency and volume of your stools. This lessens irritation to the gastrointestinal (GI) tract and can help you heal. Use this diet if you have a stricture so your intestine doesn't get blocked. The goal of this diet is to get less than 8 grams of fiber daily. It's also important to eat enough protein foods while you are on a low-fiber diet.  Drink nutrition supplements that have 1 gram of fiber or less in each serving. If your stricture is severe or if your inflammation is severe, drink more liquids to reduce symptoms and to get enough calories and protein.  Tips Eat about 5 to 6 small meals daily or about every 3 to 4 hours. Do not skip meals.  Every time you eat, include a small amount of protein (1 to 2 ounces) plus an additional food. Low fiber starch foods are the best choice to eat with protein.  Limit acidic, spicy and high-fat or fried and greasy foods to reduce GI symptoms.  Do not eat raw fruits and vegetables while on this diet. All fruits and vegetables need to be cooked and without peels or skins.  Drink a lot of fluids, at least 8 cups of fluid each day. Limit drinks with caffeine, sugar, and sugar substitutes.  Plain water is the best choice. Avoid mixing drink packets or flavor drops into water. .  Take a chewable multivitamin with minerals. Gummy vitamins do not have enough minerals and can block an ostomy and non-chewable supplements are not easily digested. Chewable supplements must be used if you have a stricture or ostomy.  If you are lactose intolerant, you may need to eat low-lactose dairy products. If you can't tolerate dairy, ask your RDN about how  you can get enough calcium from other foods.  Do not take a calcium supplement. They can cause a blockage.  It is important to add high-calcium foods gradually to your diet and monitor for symptoms to avoid a blockage.  Do not add more fiber to your diet until your health care provider or registered dietitian nutritionist (RDN) tells you it's OK. Fiber is part of whole grains, fruits and vegetables (foods from plants) and needs to be slowly added back in to your diet when your body is healed.  Choose foods that have been safely handled and prepared to lower your risk of foodborne illness. Talk to your RDN or see the Food Safety Nutrition Therapy handout for more information.   Foods Recommended These foods are low in fat and fiber and will help with your GI symptoms. Food Group Foods Recommended  Grains  Choose grain foods with less than 2 grams of fiber per serving. Refined white flour products--for example, enriched white bread without seeds, crackers or pasta Cream of wheat or rice Grits (fine ground) Tortillas: white flour or corn White rice, well-cooked (do not rinse, or soak before cooking) Cold and hot cereals made from white or refined flour such as puffed rice or corn flakes  Protein Foods  Lean, very tender, well-cooked poultry or fish; red meats: beef, pork or lamb (slow cook until soft; chop meats if you have stricture or ostomy) Eggs, well-cooked Smooth nut butters such as almond,  peanut, or sunflower Tofu  Dairy  If you have lactose intolerance, drinking milk products from cows or goats may make diarrhea worse. Foods marked with an asterisk (*) have lactose. Milk: fat-free, 1% or 2% * (choose best tolerated) Lactose-free milk Buttermilk* Fortified non-dairy milks: almond, cashew, coconut, or rice (be aware that these options are not good sources of protein so you will need to eat an additional protein food) Kefir* (Don't include kefir in the diet until approved by your health  care provider) Yogurt*/lactose-free yogurt (without nuts, fruit, granola or chocolate) Mild cheese* (hard and aged cheeses tend to be lower in lactose such as cheddar, swiss or parmesan) Cottage cheese* or lactose-free cottage cheese Low-fat ice cream* or lactose-free ice cream Sherbet* (usually lower lactose)  Vegetables  Canned and well-cooked vegetables without seeds, skins, or hulls  Carrots or green beans, cooked White, red or yellow potatoes without skins Strained vegetable juice  Fruit Soft, and well-cooked fruits without skins, seeds, or membranes Canned fruit in juice: peaches, pears, or applesauce Fruit juice without pulp diluted by half with water may be tolerated better Fruit drinks fortified with vitamin C may be tolerated better than 100% fruit juice  Oils  When possible, choose healthy oils and fats, such as olive and canola oils, plant oils rather than solid fats.  Other  Broth and strained soups made from allowed foods Desserts (small portions) without whole grains, seeds, nuts, raisins, or coconut Jelly (clear)   Foods Not Recommended These foods are higher in fat and fiber and may make your GI symptoms worse.  Food Group Foods Not Recommended  Grains  Bread, whole wheat or with whole grain flour or seeds or nuts Brown rice, quinoa, kasha, barley Tortillas: whole grain Whole wheat pasta Whole grain and high-fiber cereals, including oatmeal, bran flakes or shredded wheat Popcorn  Protein Foods  Steak, pork chops, or other meats that are fatty or have gristle Fried meat, poultry, or fish Seafood with a tough or rubbery texture, such as shrimp Luncheon meats such as bologna and salami Sausage, bacon, or hot dogs Dried beans, peas, or lentils Hummus Sushi Nuts and chunky nut butters  Dairy  Whole milk Pea milk and soymilk (may cause diarrhea, gas, bloating, and abdominal pain) Cream Half-and-half Sour cream Yogurt with added fruit, nuts, or granola or chocolate   Vegetables  Alfalfa or bean sprouts (high fiber and risk for bacteria) Raw or undercooked vegetables: beets; broccoli; brussels sprouts; cabbage; cauliflower; collard, mustard, or turnip greens; corn; cucumber; green peas or any kind of peas; kale; lima beans; mushrooms; okra; olives; pickles and relish; onions; parsnips; peppers; potato skins; sauerkraut; spinach; tomatoes  Fruit Raw fruit Dried fruit Avocado, berries, coconut Canned fruit in syrup Canned fruit with mandarin oranges, papaya or pineapple Fruit juice with pulp Prune juice Fruit skin  Oils  Pork rinds   Low-Fiber (8 grams) Sample 1-Day Menu  Breakfast  cup cream of wheat (0.5 gram fiber)  1 slice white toast (1 gram fiber)  1 teaspoon margarine, soft tub  2 scrambled eggs   Morning Snack 1 cup lactose-free nutrition supplement  Lunch 2 slices white bread (2 grams fiber)  3 tablespoons tuna  1 tablespoon mayonnaise  1 cup chicken noodle soup (1 gram fiber)   cup apple juice   Afternoon Snack 6 saltine crackers (0.5 gram fiber)  2 ounces low-fat cheddar cheese  Evening Meal 3 ounces tender chicken breast  1 cup white rice (0.5 gram fiber)   cup  cooked canned green beans (2 grams fiber)   cup cranberry juice   Evening Snack 1 cup lactose-free nutrition supplement   Copyright 2020  Academy of [employer]etics  High Fiber Nutrition Therapy  Fiber and fluid may help you feel less constipated and bloated and can also help ease diarrhea. Increase fiber slowly over the course of a few weeks. This will keep your symptoms from getting worse. Tips Tips for Adding Fiber to Your Eating Plan Slowly increase the amount of fiber you eat to 25 to 35 grams per day. Eat whole grain breads and cereals. Look for choices with 100% whole wheat, rye, oats, or bran as the first or second ingredient. Have brown or wild rice instead of white rice or potatoes. Enjoy a variety of grains. Good choices include barley, oats,  farro, kamut, and quinoa. Bake with whole wheat flour. You can use it to replace some white or all-purpose flour in recipes. Enjoy baked beans more often! Add dried beans and peas to casseroles or soups. Choose fresh fruit and vegetables instead of juices. Eat fruits and vegetables with peels or skins on. Compare food labels of similar foods to find higher fiber choices. On packaged foods, the amount of fiber per serving is listed on the Nutrition Facts label. Check the Nutrition Facts labels and try to choose products with at least 4 g dietary fiber per serving. Drink plenty of fluids. Set a goal of at least 8 cups per day. You may need even more fluid as you eat higher amounts of fiber. Fluid helps your body process fiber without discomfort. Foods Recommended Foods With at Least 4 g Fiber per Serving Food Group Choose  Grains ?- cup high-fiber cereal  Dried beans and peas  cup cooked red beans, kidney beans, large lima beans, navy beans, pinto beans, white beans, lentils, or black-eyed peas  Vegetables 1 artichoke (cooked)  Fruits  cup blackberries or raspberries 4 dried prunes   Foods With 1 to 3 g Fiber per Serving Food Group Choose  Grains 1 bagel (3.5-inch diameter) 1 slice whole wheat, cracked wheat, pumpernickel, or rye bread 2-inch square cornbread 4 whole wheat crackers 1 bran, blueberry, cornmeal, or English muffin  cup cereal with 1-3 g fiber per serving (check dietary fiber on the product's Nutrition Facts label) 2 tablespoons wheat germ or whole wheat flour  Fruits 1 apple (3-inch diameter) or  cup applesauce  cup apricots (canned) 1 banana  cup cherries (canned or fresh)  cup cranberries (fresh) 3 dates 2 medium figs (fresh)  cup fruit cocktail (canned)  grapefruit 1 kiwi fruit 1 orange (2-inch diameter) 1 peach (fresh) or  cup peaches (canned) 1 pear (fresh) or  cup pears (canned) 1 plum (2-inch diameter)  cup raisins  cup strawberries  (fresh) 1 tangerine  Vegetables  cup bean sprouts (raw)  cup beets (diced, canned)  cup broccoli, brussels sprouts, or cabbage  (cooked)  cup carrots  cup cauliflower  cup corn  cup eggplant  cup okra (boiled)  cup potatoes (baked or mashed)  cup spinach, kale, or turnip greens (cooked)  cup squash--winter, summer, or zucchini (cooked)  cup sweet potatoes or yams  cup tomatoes (canned)  Other 2 tablespoons almonds or peanuts 1 cup popcorn (popped)   High Fiber Vegetarian (Lacto-Ovo) Sample 1-Day Menu  Breakfast  cup bran cereal  1 banana  cup blueberries 1 cup 1% milk  Lunch 2 slices whole wheat bread  2 tablespoons hummus 1 ounce cheddar cheese  1 leaf lettuce 2 slices tomato  cup vegetarian baked beans 1 orange 1 cup 1% milk  Evening Meal Stir fry made with:  cup tempeh   cup brown rice 1 cup frozen broccoli 1 tablespoon soy sauce  cup peanuts  1 pear  Evening Snack 6 ounces fruit yogurt  1 cup air popped popcorn  High Fiber Vegan Sample 1-Day Menu  Breakfast  cup bran cereal  1 banana  cup blueberries 1 cup soymilk fortified with calcium, vitamin B12, and vitamin D  Lunch  cup chili with beans with:   cup tempeh crumbles  cup crushed whole wheat crackers 1 apple 1 cup soymilk fortified with calcium, vitamin B12, and vitamin D  Evening Meal 1 veggie burger  1 whole wheat bun  1 leaf lettuce  1 slice tomato Salad made with: 1 cup lettuce  cup chickpeas   cucumbers 1 tablespoon italian dressing 1 cup strawberries   Evening Snack  cup almonds  1 cup carrot sticks  High Fiber Sample 1-Day Menu  Breakfast 1/2 cup orange juice, with pulp  1/2 cup raisin bran 1 cup fat-free milk 1 cup coffee  Morning Snack 1 cup plain yogurt  2 cups water  Lunch 1 1/2 cups chili  1/2 cup kidney beans 1/2 cup soy crumble 2 tablespoons shredded cheese 8 whole wheat crackers 1 apple (with skin)   Evening Meal 2 ounces sliced chicken  1/4 cup  tofu 2 cups mixed fresh vegetables 1 cup brown rice 1/2 cup strawberries 1 cup hot tea  Evening Snack 2 tablespoons almonds  1 cup hot chocolate   Copyright 2020  Academy of [employer] and Dietetics  High-Fiber Nutrition Therapy  Fiber and fluid may help you feel less constipated and bloated and can also help ease diarrhea. Increase fiber slowly over the course of a few weeks. This will keep your symptoms from getting worse.  Tips Tips for Adding Fiber to Your Eating Plan Slowly increase the amount of fiber you eat to 25 to 35 grams per day. Eat whole grain breads and cereals. Look for choices with 100% whole wheat, rye, oats, or bran as the first or second ingredient. Have brown or wild rice instead of white rice or potatoes. Enjoy a variety of grains such as barley, oats, farro, kamut, and quinoa. Bake with whole wheat flour. You can use it to replace some white or all-purpose flour in recipes. Add dried beans and peas to casseroles or soups. Eat fruits and vegetables with peels or skins on. Choose fresh fruit and vegetables instead of juices. Check the Nutrition Facts labels and try to choose products with at least 4 g dietary fiber per serving. Drink at least 8 cups of fluid per day. You may need even more fluid as you eat higher amounts of fiber. Fluid helps your body process fiber without discomfort.  Foods High in Fiber (4 grams or more) Food Group Food Serving  Grains Cereal, bran  cup   Cereal, shredded wheat 1 cup   Oatmeal 1 cup   Popcorn 1 cup   Quinoa  cup   Wheat bran 3 tablespoons  Protein Foods Beans, canned, such as garbanzo or kidney  cup   Flaxseed, ground 2 tablespoons   Lentils  cup   Peas  cup   Soybeans  cup  Vegetables Potato with skin 1 medium   Mixed vegetables, frozen  cup  Fruit Blackberries or raspberries  cup   Coconut 1 ounce   Pear 1  medium  Foods Moderate in Fiber (1-3 grams) Food Group Food Serving  Grains Bread: whole wheat,  cracked wheat, pumpernickel, or rye 1 slice   Bun, hot dog or hamburger 1   Crackers, whole grain 4   English muffin 1   Pasta: chickpea, lentil, or whole grain  cup   Rice: brown or wild  cup   Wheat germ 2 tablespoons   Whole grains: barley, bulgur, farro, freekeh, millet, or spelt  cup  Protein Foods Nuts, all types  cup   Nut butters: almond, cashew, peanut 2 tablespoons   Seeds: pumpkin or sesame 2 tablespoons   Veggie burger 1  Vegetables Beets  cup   Broccoli  cup   Brussels sprouts  cup   Cabbage  cup   Carrots  cup   Cauliflower  cup   Corn  cup   Eggplant  cup   Greens: beet, collard, kale, or turnip  cup   Green beans  cup   Okra  cup   Spinach  cup   Squash  cup   Tomato sauce  cup   Tomato 1 medium  Fruit Apple 1 medium   Applesauce  cup   Avocado  cup   Banana 1 medium   Blueberries, cranberries, or strawberries  cup   Cherries 10   Dates 4 small   Fruit, canned  cup   Grapefruit    Kiwi 1   Orange 1 medium   Papaya    Peach 1 medium   Pineapple  cup   Plum 1   Prune juice  cup   Prunes 4   Raisins  cup   Tangerine 1 medium     High Fiber Sample 1-Day Menu View Nutrient Info Breakfast 1/2 cup orange juice, with pulp 1/2 cup raisin bran 1 cup fat-free milk 1 cup coffee  Morning Snack 1 cup plain yogurt 2 cups water  Lunch 1 1/2 cups chili 1/2 cup kidney beans 1/2 cup soy crumble 2 tablespoons shredded cheese 8 whole wheat crackers 1 apple (with skin)  Evening Meal 2 ounces sliced chicken 1/4 cup tofu 2 cups mixed fresh vegetables 1 cup brown rice 1/2 cup strawberries 1 cup hot tea  Evening Snack 2 tablespoons almonds 1 cup hot chocolate  Daily Sum Nutrient Unit Value  Macronutrients  Energy kcal 2058  Energy kJ 8620  Protein g 109  Total lipid (fat) g 53  Carbohydrate, by difference g 308  Fiber, total dietary g 57  Sugars, total g 95  Minerals  Calcium, Ca mg 1535  Iron, Fe mg 28  Sodium, Na mg  3269  Vitamins  Vitamin C, total ascorbic acid mg 86  Vitamin A, IU IU 18169  Vitamin D IU 168  Lipids  Fatty acids, total saturated g 16  Fatty acids, total monounsaturated g 19  Fatty acids, total polyunsaturated g 11  Cholesterol mg 131     High Fiber Vegan Sample 1-Day Menu View Nutrient Info Breakfast  cup bran cereal 1 banana  cup blueberries 1 cup soymilk fortified with calcium, vitamin B12, and vitamin D  Lunch  cup chili with beans with:  cup tempeh crumbles  cup crushed whole wheat crackers 1 apple 1 cup soymilk fortified with calcium, vitamin B12, and vitamin D  Evening Meal 1 veggie burger 1 whole wheat bun 1 leaf lettuce 1 slice tomato Salad made with: 1 cup lettuce  cup chickpeas  cucumbers 1 tablespoon italian dressing 1 cup strawberries  Evening  Snack  cup almonds 1 cup carrot sticks  Daily Sum Nutrient Unit Value  Macronutrients  Energy kcal 1558  Energy kJ 6527  Protein g 77  Total lipid (fat) g 57  Carbohydrate, by difference g 208  Fiber, total dietary g 43  Sugars, total g 73  Minerals  Calcium, Ca mg 1209  Iron, Fe mg 23  Sodium, Na mg 1823  Vitamins  Vitamin C, total ascorbic acid mg 136  Vitamin A, IU IU 29397  Vitamin D IU 265  Lipids  Fatty acids, total saturated g 9  Fatty acids, total monounsaturated g 22  Fatty acids, total polyunsaturated g 21  Cholesterol mg 35     High Fiber Vegetarian (Lacto-Ovo) Sample 1-Day Menu View Nutrient Info Breakfast  cup bran cereal 1 banana  cup blueberries 1 cup 1% milk  Lunch 2 slices whole wheat bread 2 tablespoons hummus 1 ounce cheddar cheese 1 leaf lettuce 2 slices tomato  cup vegetarian baked beans 1 orange 1 cup 1% milk  Evening Meal Stir fry made with:  cup tempeh  cup brown rice 1 cup frozen broccoli 1 tablespoon soy sauce  cup peanuts 1 pear  Evening Snack 6 ounces fruit yogurt 1 cup air popped popcorn  Daily Sum Nutrient Unit Value  Macronutrients   Energy kcal 1786  Energy kJ 7476  Protein g 89  Total lipid (fat) g 52  Carbohydrate, by difference g 270  Fiber, total dietary g 40  Sugars, total g 133  Minerals  Calcium, Ca mg 1496  Iron, Fe mg 16  Sodium, Na mg 1889  Vitamins  Vitamin C, total ascorbic acid mg 176  Vitamin A, IU IU 7107  Vitamin D IU 269  Lipids  Fatty acids, total saturated g 16  Fatty acids, total monounsaturated g 19  Fatty acids, total polyunsaturated g 11  Cholesterol mg 59    Copyright 2020  Academy of [employer]etics. All rights reserved

## 2022-12-21 NOTE — Progress Notes (Signed)
Pt called stated his IV was leaking and the bed was wet. Went in the room with the Pt. IV infiltrated and leaking. Removed IV and replaced to the left forearm. Pt tolerated well.

## 2022-12-21 NOTE — TOC Transition Note (Signed)
Transition of Care [facility]) - CM/SW Discharge Note   Patient Details  Name: John Gamble MRN: [id number] Date of Birth: Apr 29, 1931  Transition of Care [facility] CM/SW Contact:  Kermit Balo, RN Phone Number: 12/21/2022, 2:38 PM   Clinical Narrative:    Pt is discharging home with self care. Medications for d/c are to be delivered to the room per [facility] pharmacy.  Pt has transportation home.   Final next level of care: Home/Self Care Barriers to Discharge: No Barriers Identified   Patient Goals and CMS Choice      Discharge Placement                         Discharge Plan and Services Additional resources added to the After Visit Summary for                                       Social Determinants of Health (SDOH) Interventions SDOH Screenings   Tobacco Use: Medium Risk (12/19/2022)     Readmission Risk Interventions     No data to display

## 2022-12-21 NOTE — Progress Notes (Signed)
  Mobility Specialist Progress Note   12/21/22 1400  Mobility  Activity Ambulated with assistance in room  Level of Assistance Contact guard assist, steadying assist  Assistive Device Front wheel walker  Distance Ambulated (ft) 40 ft  Range of Motion/Exercises Active;All extremities  Activity Response Tolerated well   Patient received in recliner and agreeable to participate. Deferred hallway ambulation for unspecified reasons. Ambulated short distance in room with min guard and slow steady gait. Returned to recliner without complaint or incident. Was left in recliner with all needs met, call bell in reach.   John Gamble, BS EXP Mobility Specialist Please contact via SecureChat or Rehab office at [phone]

## 2022-12-21 NOTE — Plan of Care (Signed)

## 2022-12-21 NOTE — Progress Notes (Signed)
Nutrition Brief Note   RD consulted for nutrition education regarding Nutrition Management for Diverticulosis/Diverticulitis.  RD provided both "Low Fiber Nutrition Therapy" and "High Fiber Nutrition Therapy" handout from the Academy of [employer] in the discharge instructions. Informed RN. Pt to discharge home.   Current diet order is regular, patient is consuming approximately 100% of meals at this time. Labs and medications reviewed.    Kirby Crigler RD, LDN Clinical Dietitian See Loretha Stapler for contact information.

## 2022-12-21 NOTE — Discharge Summary (Signed)
Physician Discharge Summary  TOKUO ABAJIAN UEA:[id number] DOB: 1931-06-16 DOA: 12/18/2022  PCP: Olive Bass, MD  Admit date: 12/18/2022 Discharge date: 12/21/2022  Time spent: 55 minutes  Recommendations for Outpatient Follow-up:  Follow-up with Olive Bass, MD in 2 weeks.  On follow-up patient will need a basic metabolic profile done to follow-up on electrolytes and renal function.  Patient will need a CBC done to follow-up on H&H. Follow-up with Dr. Adela Lank, gastroenterology in 4 weeks.   Discharge Diagnoses:  Principal Problem:   Rectal bleeding Active Problems:   Diverticulitis   Chronic combined systolic (congestive) and diastolic (congestive) heart failure (HCC)   CAD S/P percutaneous coronary angioplasty   Discharge Condition: Stable and improved.  Diet recommendation: Heart healthy  Filed Weights   12/19/22 0807  Weight: 85.3 kg    History of present illness:  HPI per Dr. Kimberlee Nearing is a 89 y.o. male with medical history significant for lower GI bleed from diverticular disease, coronary artery disease status post PCI with stenting on low dose ASA , hypertension, hyperlipidemia, type 2 diabetes, history of mild reflux esophagitis, mild erosive gastroduodenitis, seen on EGD done on 08/04/2021.  Who presents to [facility] ED from home due to rectal bleeding, bright red blood per rectum, today.  Associated with peri-umbilical abdominal cramping.   In the ED, initially hypotensive improved with IV fluid bolus.  CTA GI bleed did not show evidence of active bleeding however showed mild acute sigmoid diverticulitis.  Empiric IV antibiotics were initiated in the ED.  The patient was admitted by [facility], hospitalist service, to progressive care unit as inpatient status.   ED Course: Temperature 98.3.  BP 120/67, pulse 51, respiration rate 19 O2 saturation 100% on room air.  Lab studies remarkable for WBC 15.5K, Hg 12.6K.  Hospital Course:  #1 rectal bleeding likely secondary to  diverticular disease -Patient with history of diverticulosis, presented with rectal bleeding noted to be hypotensive initially on admission which improved on IV fluids.  Cardiac medications held. -CT angiogram abdomen and pelvis was done which did not show any evidence of active bleeding however showed mild acute sigmoid diverticulitis. -Patient was monitored, improved clinically, seen by GI and started on clear liquids. -Per GI no indication for colonoscopy at this time. -Patient improved clinically, bleeding subsided and had resolved by day of discharge. -Hemoglobin remained stable and was 11.4 by day of discharge. -Outpatient follow-up with GI in 4 weeks.  2.  Mild acute sigmoid diverticulitis -Noted on CT angiogram abdomen and pelvis as patient had presented with rectal bleeding. -Patient placed on IV Unasyn, noted to have a leukocytosis on presentation with a white count as high as 15.5. -Patient improved clinically, started on clear liquids and diet advanced to a soft diet. -Patient remained afebrile, leukocytosis had resolved by day of discharge. -Patient transition from IV Unasyn to Augmentin and will be discharged on 5 more days of Augmentin to complete a 7-day course of antibiotic treatment. -Patient was followed by GI during the hospitalization.  3.  CAD status post PCI -Patient noted to be on aspirin prior to admission which was held during the hospitalization and will be resumed 1 week postdischarge. -Most of patient's cardiac medications were held during the hospitalization, metoprolol resumed. -Patient's cardiac medications will be resumed on discharge.  4.  Type 2 diabetes mellitus -Hemoglobin A1c noted at 6.7 (12/19/2022) -Patient's oral hypoglycemic agents were held during the hospitalization. -Patient maintained on sliding scale insulin. -Oral hypoglycemic agents will  be resumed on discharge.  5.  Chronic combined diastolic and systolic CHF -2D echo with EF of 45 to 50%  with grade 1 diastolic dysfunction. -Cardiac medications initially held on presentation, metoprolol subsequently resumed. -Patient remained euvolemic during the hospitalization. -ARB and spironolactone will be resumed on discharge.   Procedures: CT angiogram abdomen and pelvis 12/19/2022  Consultations: Gastroenterology: Dr. Adela Lank 12/19/2022  Discharge Exam: Vitals:   12/21/22 0331 12/21/22 0740  BP: (!) 145/82 (!) 155/75  Pulse: (!) 41 61  Resp: 17 16  Temp: 97.6 F (36.4 C) 97.9 F (36.6 C)  SpO2: 97% 97%    General: NAD Cardiovascular: RRR no m/r/g Respiratory: CTAB.  No wheezes, no crackles, no rhonchi.  Fair air movement.  Speaking in full sentences.  Discharge Instructions   Discharge Instructions     Diet - low sodium heart healthy   Complete by: As directed    Increase activity slowly   Complete by: As directed       Allergies as of 12/21/2022       Reactions   Atorvastatin Other (See Comments)   myalgias   Oysters [shellfish Allergy] Nausea Only   Sulfa Antibiotics Nausea And Vomiting        Medication List     TAKE these medications    acyclovir 800 MG tablet Commonly known as: ZOVIRAX Take 800 mg by mouth daily.   amoxicillin-clavulanate 875-125 MG tablet Commonly known as: AUGMENTIN Take 1 tablet by mouth every 12 (twelve) hours for 5 days.   aspirin EC 81 MG tablet Take 1 tablet (81 mg total) by mouth daily. Start taking on: Dec 28, 2022 What changed: These instructions start on Dec 28, 2022. If you are unsure what to do until then, ask your doctor or other care provider.   dorzolamide 2 % ophthalmic solution Commonly known as: TRUSOPT Place 1 drop into both eyes 2 (two) times daily.   finasteride 5 MG tablet Commonly known as: PROSCAR Take 1 tablet (5 mg total) by mouth daily.   Fish Oil 1200 MG Caps Take 1,200 mg by mouth daily.   Glucosamine-Chondroitin 250-200 MG Tabs Take 2 tablets by mouth 2 (two) times daily.    ketoconazole 2 % cream Commonly known as: NIZORAL Apply 1 Application topically daily.   ketoconazole 2 % cream Commonly known as: NIZORAL Apply 1 Application topically daily.   ketoconazole 2 % cream Commonly known as: NIZORAL Apply 1 Application topically daily.   ketoconazole 2 % cream Commonly known as: NIZORAL Apply 1 Application topically daily.   losartan 50 MG tablet Commonly known as: COZAAR Take 1 tablet (50 mg total) by mouth daily.   metFORMIN 500 MG 24 hr tablet Commonly known as: GLUCOPHAGE-XR Take 500 mg by mouth 2 (two) times daily.   metoprolol succinate 25 MG 24 hr tablet Commonly known as: TOPROL-XL TAKE 1 TABLET (25 MG TOTAL) BY MOUTH DAILY.   multivitamin with minerals Tabs tablet Take 1 tablet by mouth daily. Centrum Silver   OneTouch Delica Plus Lancet33G Misc 1 each by Other route daily.   OneTouch Ultra test strip Generic drug: glucose blood 1 each by Other route daily.   OneTouch Ultra test strip Generic drug: glucose blood daily.   pantoprazole 40 MG tablet Commonly known as: PROTONIX Take 1 tablet (40 mg total) by mouth daily at 6 (six) AM.   polyethylene glycol 17 g packet Commonly known as: MIRALAX / GLYCOLAX Take 17 g by mouth daily as needed for mild  constipation. Mix with 8 oz liquid and drink   prednisoLONE acetate 1 % ophthalmic suspension Commonly known as: PRED FORTE Place 1 drop into the left eye at bedtime.   rosuvastatin 20 MG tablet Commonly known as: CRESTOR Take 20 mg by mouth daily.   silodosin 8 MG Caps capsule Commonly known as: RAPAFLO Take 8 mg by mouth daily.   spironolactone 25 MG tablet Commonly known as: ALDACTONE Take 0.5 tablets (12.5 mg total) by mouth daily.   Zinc 50 MG Caps Take 50 mg by mouth daily with supper.       Allergies  Allergen Reactions   Atorvastatin Other (See Comments)    myalgias   Oysters [Shellfish Allergy] Nausea Only   Sulfa Antibiotics Nausea And Vomiting     Follow-up Information     Dough, Doris Cheadle, MD. Schedule an appointment as soon as possible for a visit in 2 week(s).   Specialty: Family Medicine Contact information: [address] [place] [address] [phone]         Benancio Deeds, MD. Schedule an appointment as soon as possible for a visit in 4 week(s).   Specialty: Gastroenterology Contact information: [address] [place] [address] [place] [address] [phone]                  The results of significant diagnostics from this hospitalization (including imaging, microbiology, ancillary and laboratory) are listed below for reference.    Significant Diagnostic Studies: CT ANGIO GI BLEED  Result Date: 12/19/2022 CLINICAL DATA:  Gastrointestinal hemorrhage EXAM: CTA ABDOMEN AND PELVIS WITHOUT AND WITH CONTRAST TECHNIQUE: Multidetector CT imaging of the abdomen and pelvis was performed using the standard protocol during bolus administration of intravenous contrast. Multiplanar reconstructed images and MIPs were obtained and reviewed to evaluate the vascular anatomy. RADIATION DOSE REDUCTION: This exam was performed according to the departmental dose-optimization program which includes automated exposure control, adjustment of the mA and/or kV according to patient size and/or use of iterative reconstruction technique. CONTRAST:  OMNIPAQUE IOHEXOL 350 MG/ML SOLN COMPARISON:  03/28/2022 FINDINGS: VASCULAR Aorta: Normal caliber aorta without aneurysm, dissection, vasculitis or significant stenosis. Moderate atherosclerotic calcification Celiac: Patent without evidence of aneurysm, dissection, vasculitis or significant stenosis. SMA: Patent without evidence of aneurysm, dissection, vasculitis or significant stenosis. Renals: Both renal arteries are patent without evidence of aneurysm, dissection, vasculitis, fibromuscular dysplasia or significant stenosis. IMA: Patent without evidence of aneurysm, dissection, vasculitis or  significant stenosis. Inflow: Patent without evidence of aneurysm, dissection, vasculitis or significant stenosis. Proximal Outflow: Bilateral common femoral and visualized portions of the superficial and profunda femoral arteries are patent without evidence of aneurysm, dissection, vasculitis or significant stenosis. Veins: Unremarkable. In particular, the inferior and superior mesenteric veins and portal vein are patent. Review of the MIP images confirms the above findings. NON-VASCULAR Lower chest: Mild bibasilar cylindrical bronchiectasis. Extensive coronary artery calcification. Stable mild cardiomegaly. Hepatobiliary: Tiny cyst within the lateral segment of the left hepatic lobe. Liver otherwise unremarkable. Cholelithiasis without pericholecystic inflammatory change. No intra or extrahepatic biliary ductal dilation. Pancreas: Unremarkable Spleen: Unremarkable Adrenals/Urinary Tract: The adrenal glands are unremarkable. The kidneys are normal in size and position. Scattered cortical hypodensities are seen within the kidneys bilaterally which are too small to accurately characterize but likely represent multiple tiny cortical cysts. No follow-up imaging is recommended for these lesions. The kidneys are otherwise unremarkable. The bladder is trabeculated and there are innumerable small diverticula identified in keeping with changes of chronic bladder outlet obstruction.  The bladder is not distended. Stomach/Bowel: No active gastrointestinal hemorrhage is identified. There is severe descending and sigmoid colonic diverticulosis. There is superimposed focal pericolonic inflammatory change involving the proximal sigmoid colon best seen on axial image # 158/17 in keeping with mild acute uncomplicated sigmoid diverticulitis. There is no evidence of obstruction or perforation. No loculated pericolonic fluid collections are identified. The stomach, small bowel, and large bowel are otherwise unremarkable. Appendix  normal. No free intraperitoneal gas or fluid. Lymphatic: No pathologic adenopathy within the abdomen and pelvis. Reproductive: Marked prostatic enlargement. Other: Small fat containing left inguinal hernia Musculoskeletal: Degenerative changes are seen throughout the lumbar spine. No acute bone abnormality. Osseous structures are diffusely osteopenic. IMPRESSION: 1. No active gastrointestinal hemorrhage identified. 2. Extensive coronary artery calcification. Stable mild cardiomegaly. 3. Cholelithiasis. 4. Mild acute uncomplicated sigmoid diverticulitis. Severe superimposed distal colonic diverticulosis. 5. Marked prostatic enlargement with evidence of chronic bladder outlet obstruction. Aortic Atherosclerosis (ICD10-I70.0). Electronically Signed   By: Helyn Numbers M.D.   On: 12/19/2022 02:19    Microbiology: Recent Results (from the past 240 hour(s))  Culture, blood (Routine X 2) w Reflex to ID Panel     Status: None (Preliminary result)   Collection Time: 12/19/22  5:11 AM   Specimen: BLOOD  Result Value Ref Range Status   Specimen Description BLOOD BLOOD RIGHT HAND  Final   Special Requests   Final    BOTTLES DRAWN AEROBIC AND ANAEROBIC Blood Culture adequate volume   Culture   Final    NO GROWTH 2 DAYS Performed at [facility], [address] [place], [place] [address]    Report Status PENDING  Incomplete  Culture, blood (Routine X 2) w Reflex to ID Panel     Status: None (Preliminary result)   Collection Time: 12/19/22  5:16 AM   Specimen: BLOOD  Result Value Ref Range Status   Specimen Description BLOOD LEFT ANTECUBITAL  Final   Special Requests   Final    BOTTLES DRAWN AEROBIC AND ANAEROBIC Blood Culture adequate volume   Culture   Final    NO GROWTH 2 DAYS Performed at [facility], [address], [place], [place] [address]    Report Status PENDING  Incomplete     Labs: Basic Metabolic Panel: Recent Labs  Lab 12/18/22 2320 12/19/22 0620 12/20/22 0303  12/21/22 1019  NA 136 135 135 136  K 4.6 4.4 3.6 3.7  CL 108 107 107 101  CO2 18* 21* 22 25  GLUCOSE 293* 140* 149* 144*  BUN 18 17 8 11   CREATININE 1.16 0.92 0.87 0.99  CALCIUM 8.9 8.5* 8.3* 9.0  MG  --  1.9  --  2.1  PHOS  --  3.0  --   --    Liver Function Tests: Recent Labs  Lab 12/18/22 2320  AST 20  ALT 18  ALKPHOS 60  BILITOT 0.7  PROT 5.9*  ALBUMIN 3.5   No results for input(s): "LIPASE", "AMYLASE" in the last 168 hours. No results for input(s): "AMMONIA" in the last 168 hours. CBC: Recent Labs  Lab 12/18/22 2320 12/19/22 0500 12/19/22 0959 12/19/22 1649 12/20/22 0303 12/21/22 1019  WBC 15.5* 10.6*  --   --  7.6 8.1  NEUTROABS  --   --   --   --  5.1 5.6  HGB 12.6* 10.6* 10.6* 10.2* 10.0* 11.4*  HCT 40.5 33.8* 33.8* 31.9* 30.6* 35.7*  MCV 81.7 81.4  --   --  78.1*  80.6  PLT 383 271  --   --  269 335   Cardiac Enzymes: No results for input(s): "CKTOTAL", "CKMB", "CKMBINDEX", "TROPONINI" in the last 168 hours. BNP: BNP (last 3 results) Recent Labs    03/21/22 1036 03/28/22 0032  BNP 192.8* 256.4*    ProBNP (last 3 results) No results for input(s): "PROBNP" in the last 8760 hours.  CBG: Recent Labs  Lab 12/20/22 1201 12/20/22 1640 12/20/22 1955 12/21/22 0738 12/21/22 1157  GLUCAP 139* 199* 152* 161* 123*       Signed:  Ramiro Harvest MD.  [facility] 12/21/2022, 2:45 PM

## 2022-12-21 NOTE — Progress Notes (Signed)
Patient being discharged home, self care. AVS discussed with patient and wife at bedside including discharge meds and recommended diet. Verbalized understanding. PIV removed. TOC meds delivered to patient prior to discharge. Patient escorted off the unit with staff via WC in NAD.

## 2022-12-22 LAB — CULTURE, BLOOD (ROUTINE X 2)

## 2022-12-24 LAB — CULTURE, BLOOD (ROUTINE X 2)
Culture: NO GROWTH
Culture: NO GROWTH
Special Requests: ADEQUATE

## 2023-01-04 ENCOUNTER — Other Ambulatory Visit: Payer: Self-pay | Admitting: Cardiology

## 2023-01-23 ENCOUNTER — Ambulatory Visit: Payer: Medicare Other | Admitting: Podiatry

## 2023-01-23 DIAGNOSIS — M79675 Pain in left toe(s): Secondary | ICD-10-CM | POA: Diagnosis not present

## 2023-01-23 DIAGNOSIS — M79674 Pain in right toe(s): Secondary | ICD-10-CM | POA: Diagnosis not present

## 2023-01-23 DIAGNOSIS — E119 Type 2 diabetes mellitus without complications: Secondary | ICD-10-CM | POA: Diagnosis not present

## 2023-01-23 DIAGNOSIS — B351 Tinea unguium: Secondary | ICD-10-CM | POA: Diagnosis not present

## 2023-01-23 DIAGNOSIS — G629 Polyneuropathy, unspecified: Secondary | ICD-10-CM

## 2023-01-23 DIAGNOSIS — L84 Corns and callosities: Secondary | ICD-10-CM | POA: Diagnosis not present

## 2023-01-23 NOTE — Progress Notes (Signed)
  Subjective:  Patient ID: John Gamble, male    DOB: August 04, 1930,  MRN: [id number]  Chief Complaint  Patient presents with   Nail Problem    Routine Foot Care- nail trim     89 y.o. male presents with the above complaint. History confirmed with patient. Patient presenting for follow-up of small ulceration on the plantar right forefoot.  Also previously had some calluses on the toes that are giving him pain third toe on the left foot.  He has been putting Betadine on all of the callused areas.  As well as the wound on the right plantar forefoot.  Denies any drainage or redness.  Objective:  Physical Exam: warm, good capillary refill nail exam onychomycosis of the toenails, onycholysis, and dystrophic nails. Peeling dry skin bilateral foot, mild erythematous rash.  Attention directed the central plantar right forefoot there is noted to be hyperkeratotic lesion with improved appearance from prior.  There is no open wound at this time.  Is healed and with no erythema drainage or sinus tract  DP pulses palpable, PT pulses palpable, and protective sensation diminished to forefoot.  Left Foot:  Pain with palpation of nails due to elongation and dystrophic growth. Hyperkeratotic lesion distal tuft of 2nd toe left foot.  Right Foot: Pain with palpation of nails due to elongation and dystrophic growth. Hyperkeratotic lesion sub 3rd met head right foot and distal right 3rd toe.  Assessment:   1. Pain due to onychomycosis of toenails of both feet   2. Pre-ulcerative calluses   3. Neuropathy   4. Type 2 diabetes mellitus without complication, without long-term current use of insulin (HCC)         Plan:  Patient was evaluated and treated and all questions answered.  #Hyperkeratotic lesions/pre ulcerative calluses present sub 3rd met head right foot, distal left 2nd toe and distal right 3rd toe # Blood blister underlying right plantar forefoot subthird met head All symptomatic hyperkeratoses  were safely debrided with a sterile #15 blade to patient's level of comfort without incident. We discussed preventative and palliative care of these lesions including supportive and accommodative shoegear, padding, prefabricated and custom molded accommodative orthoses, use of a pumice stone and lotions/creams daily.  #Onychomycosis with pain  -Nails palliatively debrided as below. -Educated on self-care  Procedure: Nail Debridement Rationale: Pain Type of Debridement: manual, sharp debridement. Instrumentation: Nail nipper, rotary burr. Number of Nails: 10   Return in about 3 months (around 04/25/2023) for [facility].         Corinna Gab, DPM [facility] / [facility]

## 2023-01-31 ENCOUNTER — Telehealth: Payer: Self-pay | Admitting: Podiatry

## 2023-01-31 ENCOUNTER — Other Ambulatory Visit (INDEPENDENT_AMBULATORY_CARE_PROVIDER_SITE_OTHER): Payer: Medicare Other | Admitting: Podiatry

## 2023-01-31 DIAGNOSIS — M79674 Pain in right toe(s): Secondary | ICD-10-CM

## 2023-01-31 DIAGNOSIS — M79675 Pain in left toe(s): Secondary | ICD-10-CM

## 2023-01-31 DIAGNOSIS — B351 Tinea unguium: Secondary | ICD-10-CM

## 2023-01-31 MED ORDER — KETOCONAZOLE 2 % EX CREA
1.0000 | TOPICAL_CREAM | Freq: Every day | CUTANEOUS | 3 refills | Status: DC
Start: 2023-01-31 — End: 2023-09-29

## 2023-01-31 NOTE — Progress Notes (Signed)
Rx for ketoconazole sent.

## 2023-01-31 NOTE — Telephone Encounter (Signed)
Pt called requesting a refill for Rx Ketoconazole 2%   Please advise

## 2023-03-18 ENCOUNTER — Other Ambulatory Visit: Payer: Self-pay | Admitting: Cardiology

## 2023-03-23 NOTE — Progress Notes (Signed)
Cardiology Office Note    Date:  03/27/2023   ID:  John Gamble, DOB [birth year], MRN [id number]  PCP:  Olive Bass, MD  Cardiologist:  Nikolai Wilczak [place], MD   Chief Complaint  Patient presents with   Coronary Artery Disease       History of Present Illness: John Gamble is a 89 y.o. male who presents for follow up CAD. He has a history of coronary disease and is status post stenting of the left circumflex coronary in 2000. He had a normal stress Myoview in July of 2011. He also has a history of hypertension and hyperlipidemia.   In September 2016 he had a nuclear stress test that was abnormal. This led to a cardiac cath.. Stents were patent but he did have ostial RCA (nondominant) and diagonal disease. Since he was asymptomatic medical therapy was recommended.   He did have Covid 19 infection in January 2021. He was admitted to [facility] with hypoxia and bilateral PNA.  He brings a diary of his BP readings and his BP has been excellent. He notes that since his Covid infection his energy is not as good and he has more dyspnea on exertion. He had persistent DOE and some arm pain.   He was evaluated with a Myoview study which was high risk with EF 25% and multiple perfusion abnormalities. This led to a cardiac cath which showed patent stents in the dominant LCx. There was critical ostial LAD disease with heavy calcification. Fortunately right heart and LV filling pressures were ok. Patient returned for complex PCI with atherectomy on 11/04/20. He underwent  PCI involving atherectomy and stenting of the ostial LAD with DES. An excellent result was obtained.  John Gamble was held due to recurrent UTI. A1c was 6.6%. He was continued on ARB ( unable to afford Entresto ). and Toprol XL and aldactone were added. With revascularization and medical therapy there was significant improvement in LV function with EF up to 45-50%.   He was admitted in January 2023 with lower GI bleed. Colonoscopy showed  diverticuli. Upper EGD showed mild erosive gastritis. Hgb dropped to 10.3. managed medically. Plavix initially held but resumed at DC. BP meds reduced due to hypotension (losartan and aldactone). Some NSVT noted on monitor but asymptomatic. On follow up BP was high and losartan and aldactone resumed.  He was admitted in August 2023 with lower GI bleed felt to be diverticular. Since he had pan EGD in Jan no further work up needed.   He was admitted in May 2024 with rectal bleeding. CT GI showed no source of bleeding but there was some sigmoid diverticulitis which was treated with antibiotics. Hgb remained stable. No colonoscopy indicated per GI.   On follow up today he denies any chest pain. He does note some chronic dyspnea. Is back going to the gym and riding the bike for 30 minutes.  No further bleeding.      Past Medical History:  Diagnosis Date   Coronary artery disease    Hyperlipidemia    Hypertension    MI, old    OA (osteoarthritis)    Pneumonia    hx of 2013     Past Surgical History:  Procedure Laterality Date   BIOPSY  08/04/2021   Procedure: BIOPSY;  Surgeon: Hilarie Fredrickson, MD;  Location: [facility] ENDOSCOPY;  Service: Endoscopy;;   CARDIAC CATHETERIZATION  01/26/1999   EF 55%   CARDIAC CATHETERIZATION N/A 05/06/2015   Procedure: Left Heart Cath  and Coronary Angiography;  Surgeon: Jannet Calip M [place], MD;  Location: [facility] INVASIVE CV LAB;  Service: Cardiovascular;  Laterality: N/A;   CARDIOVASCULAR STRESS TEST  02/22/2010   EF 61%   COLONOSCOPY WITH PROPOFOL N/A 08/04/2021   Procedure: COLONOSCOPY WITH PROPOFOL;  Surgeon: Hilarie Fredrickson, MD;  Location: [facility] ENDOSCOPY;  Service: Endoscopy;  Laterality: N/A;   corneal implant     left   CORONARY ATHERECTOMY N/A 11/04/2020   Procedure: CORONARY ATHERECTOMY;  Surgeon: [place], Amberlea Spagnuolo M, MD;  Location: [facility] INVASIVE CV LAB;  Service: Cardiovascular;  Laterality: N/A;   CORONARY STENT INTERVENTION N/A 11/04/2020   Procedure: CORONARY STENT  INTERVENTION;  Surgeon: [place], Karlton Maya M, MD;  Location: [facility] INVASIVE CV LAB;  Service: Cardiovascular;  Laterality: N/A;   CORONARY STENT PLACEMENT  2000   BMS LEFT CIRCUMFLEX CORONARY   CORONARY ULTRASOUND/IVUS N/A 11/04/2020   Procedure: Intravascular Ultrasound/IVUS;  Surgeon: [place], Jurrell Royster M, MD;  Location: [facility] INVASIVE CV LAB;  Service: Cardiovascular;  Laterality: N/A;   ESOPHAGOGASTRODUODENOSCOPY N/A 08/04/2021   Procedure: ESOPHAGOGASTRODUODENOSCOPY (EGD);  Surgeon: Hilarie Fredrickson, MD;  Location: [facility] ENDOSCOPY;  Service: Endoscopy;  Laterality: N/A;   INGUINAL HERNIA REPAIR     REPLACEMENT TOTAL KNEE     RIGHT/LEFT HEART CATH AND CORONARY ANGIOGRAPHY N/A 10/27/2020   Procedure: RIGHT/LEFT HEART CATH AND CORONARY ANGIOGRAPHY;  Surgeon: [place], Shjon Lizarraga M, MD;  Location: [facility] INVASIVE CV LAB;  Service: Cardiovascular;  Laterality: N/A;   TOTAL KNEE ARTHROPLASTY Left 09/19/2014   Procedure: LEFT TOTAL KNEE ARTHROPLASTY;  Surgeon: Eugenia Mcalpine, MD;  Location: [facility] ORS;  Service: Orthopedics;  Laterality: Left;     Current Outpatient Medications  Medication Sig Dispense Refill   acyclovir (ZOVIRAX) 800 MG tablet Take 800 mg by mouth daily.      aspirin EC 81 MG tablet Take 1 tablet (81 mg total) by mouth daily. 30 tablet 11   dorzolamide (TRUSOPT) 2 % ophthalmic solution Place 1 drop into both eyes 2 (two) times daily.     finasteride (PROSCAR) 5 MG tablet Take 1 tablet (5 mg total) by mouth daily.     Glucosamine-Chondroitin 250-200 MG TABS Take 2 tablets by mouth 2 (two) times daily.     glucose blood (ONETOUCH ULTRA) test strip 1 each by Other route daily.     ketoconazole (NIZORAL) 2 % cream Apply 1 Application topically daily. 15 g 0   ketoconazole (NIZORAL) 2 % cream Apply 1 Application topically daily. 15 g 0   ketoconazole (NIZORAL) 2 % cream Apply 1 Application topically daily. 60 g 0   ketoconazole (NIZORAL) 2 % cream Apply 1 Application topically daily. 60 g 3   Lancets (ONETOUCH  DELICA PLUS LANCET33G) MISC 1 each by Other route daily.     losartan (COZAAR) 50 MG tablet TAKE 1 TABLET BY MOUTH EVERY DAY 90 tablet 3   metFORMIN (GLUCOPHAGE-XR) 500 MG 24 hr tablet Take 500 mg by mouth 2 (two) times daily.     metoprolol succinate (TOPROL-XL) 25 MG 24 hr tablet TAKE 1 TABLET (25 MG TOTAL) BY MOUTH DAILY. 90 tablet 3   Multiple Vitamin (MULTIVITAMIN WITH MINERALS) TABS tablet Take 1 tablet by mouth daily. Centrum Silver     Omega-3 Fatty Acids (FISH OIL) 1200 MG CAPS Take 1,200 mg by mouth daily.     ONETOUCH ULTRA test strip daily.     pantoprazole (PROTONIX) 40 MG tablet Take 1 tablet (40 mg total) by mouth daily at 6 (six) AM. 30  tablet 0   polyethylene glycol (MIRALAX / GLYCOLAX) packet Take 17 g by mouth daily as needed for mild constipation. Mix with 8 oz liquid and drink     prednisoLONE acetate (PRED FORTE) 1 % ophthalmic suspension Place 1 drop into the left eye at bedtime.     rosuvastatin (CRESTOR) 20 MG tablet Take 20 mg by mouth daily.     silodosin (RAPAFLO) 8 MG CAPS capsule Take 8 mg by mouth daily.     spironolactone (ALDACTONE) 25 MG tablet TAKE 1/2 TABLET BY MOUTH EVERY DAY 45 tablet 3   Zinc 50 MG CAPS Take 50 mg by mouth daily with supper.      No current facility-administered medications for this visit.    Allergies:   Atorvastatin, Oysters [shellfish allergy], and Sulfa antibiotics    Social History:  The patient  reports that he quit smoking about 34 years ago. His smoking use included cigarettes. He started smoking about 84 years ago. He has a 25 pack-year smoking history. He has never used smokeless tobacco. He reports that he does not drink alcohol and does not use drugs.   Family History:  The patient's family history includes Cancer in his father and mother.    ROS:  Please see the history of present illness.   Otherwise, review of systems are positive for none.   All other systems are reviewed and negative.    PHYSICAL EXAM: VS:  BP  126/64 (BP Location: Left Arm, Patient Position: Sitting, Cuff Size: Normal)   Pulse 70   Ht 6\' 1"  (1.854 m)   Wt 177 lb 12.8 oz (80.6 kg)   SpO2 98%   BMI 23.46 kg/m  , BMI Body mass index is 23.46 kg/m. GENERAL:  Well appearing WM in NAD HEENT:  PERRL, EOMI, sclera are clear. Oropharynx is clear. NECK:  No jugular venous distention, carotid upstroke brisk and symmetric, no bruits, no thyromegaly or adenopathy LUNGS:  Clear to auscultation bilaterally CHEST:  Unremarkable HEART:  RRR with frequent extrasystoles,  PMI not displaced or sustained,S1 and S2 within normal limits, no S3, no S4: no clicks, no rubs, no murmurs ABD:  Soft, nontender. BS +, no masses or bruits. No hepatomegaly, no splenomegaly EXT:  2 + pulses throughout, no edema, no cyanosis no clubbing. Radial site without hematoma.  SKIN:  Warm and dry.  No rashes NEURO:  Alert and oriented x 3. Cranial nerves II through XII intact. PSYCH:  Cognitively intact  EKG:  EKG is not  ordered today.     Recent Labs: 03/28/2022: B Natriuretic Peptide 256.4 12/18/2022: ALT 18 12/21/2022: BUN 11; Creatinine, Ser 0.99; Hemoglobin 11.4; Magnesium 2.1; Platelets 335; Potassium 3.7; Sodium 136    Lipid Panel    Component Value Date/Time   CHOL 91 11/05/2020 0243   TRIG 80 11/05/2020 0243   HDL 35 (L) 11/05/2020 0243   CHOLHDL 2.6 11/05/2020 0243   VLDL 16 11/05/2020 0243   LDLCALC 40 11/05/2020 0243      Wt Readings from Last 3 Encounters:  03/27/23 177 lb 12.8 oz (80.6 kg)  12/19/22 188 lb (85.3 kg)  09/26/22 189 lb 12.8 oz (86.1 kg)      Other studies Reviewed: Additional studies/ records that were reviewed today include:  November 2017. Cholesterol 164, triglycerides 300, HDL 32 LDL 83. A1c 6.7%. Dated 12/28/16: glucose 150, A1c 7%. Cholesterol 94, triglyderdes 198, HDL 30, LDL 38. Other chemistries normal.  Dated 07/06/17. Glucose 155, otherwise CMET normal. Cholesterol  109, triglycerides 164, HDL 35, LDL 59.  A1c  6.5%. Dated 07/17/18: cholesterol 108, triglycerides 167, HDL 38, LDL 55. Glucose 142. Otherwise CMET normal. A1c 6.7% Dated 07/22/19: cholesterol 103, triglycerides 187, HDL 36, LDL 53.  Dated 08/17/19: glucose 147. Otherwise CMET normal. Hgb 13.9.  Dated 01/23/20: Glucose 127. Otherwise CMET normal. Dated 07/27/20: glucose 165, otherwise CMET normal. Cholesterol 108, triglycerides 155, HDL 35, LDL 54. A1c 6.6%.    Myoview 10/21/20: Study Highlights  Nuclear stress EF: 22%. The left ventricular ejection fraction is severely decreased (<30%). Defect 1: There is a defect present in the mid anterior, mid anteroseptal, mid inferolateral, mid anterolateral, apical anterior, apical inferior, apical lateral and apex location. Findings consistent with prior large anterior apical myocardial infarction. This is a high risk study. There is no evidence of ischemia . He has had a previous anterior apical MI   Cardiac cath 10/27/20:  RIGHT/LEFT HEART CATH AND CORONARY ANGIOGRAPHY    Conclusion    Ost LAD to Prox LAD lesion is 95% stenosed. 1st Diag lesion is 70% stenosed. Previously placed Ost Cx to Prox Cx stent (unknown type) is widely patent. Previously placed LPDA stent (unknown type) is widely patent. Ost RCA to Prox RCA lesion is 90% stenosed. There is severe left ventricular systolic dysfunction. LV end diastolic pressure is normal. The left ventricular ejection fraction is 25-35% by visual estimate.   1. Severe 2 vessel obstructive CAD.    - 90% ostial LAD. Severely calcified. 70% first diagonal    - patent stents in the proximal LCx and left PDA    - 90% ostial nondominant RCA 2. Severe LV dysfunction with regional wall motion abnormalities. 3. Normal LV filling pressures 4. Normal right heart pressures. 5. Preserved cardiac output.    Plan: recommend staged complex  PCI of the ostial LAD with atherectomy and stenting. The diagonal and RCA disease are old and involve small vessels.  While CABG would be an option given his advanced age I would favor a percutaneous approach.    PCI 11/04/20: Procedures  CORONARY STENT INTERVENTION  CORONARY ATHERECTOMY  Intravascular Ultrasound/IVUS    Conclusion    Ost LAD to Prox LAD lesion is 95% stenosed. A drug-eluting stent was successfully placed using a SYNERGY XD [id number]. Post intervention, there is a 0% residual stenosis.   1. Successful PCI of the ostial LAD with IVUS guidance, orbital atherectomy and DES x 1   Plan: DAPT for at least 6 months. Will observe overnight and anticipate DC in am.     Recommendations  Antiplatelet/Anticoag Recommend uninterrupted dual antiplatelet therapy with Aspirin 81mg  daily and Clopidogrel 75mg  daily for a minimum of 6 months (stable ischemic heart disease-Class I recommendation   Coronary Diagrams   Diagnostic Dominance: Left    Intervention     Implants      Echo 11/05/20: IMPRESSIONS     1. Diffuse hypokinesis worse in inferior base with abnormal septal motion  . Left ventricular ejection fraction, by estimation, is 25 to 30%. The  left ventricle has severely decreased function. The left ventricle  demonstrates global hypokinesis. The left   ventricular internal cavity size was moderately dilated. There is mild  left ventricular hypertrophy. Left ventricular diastolic parameters are  indeterminate.   2. Right ventricular systolic function is normal. The right ventricular  size is normal. There is normal pulmonary artery systolic pressure.   3. The mitral valve is normal in structure. Mild mitral valve  regurgitation. No evidence of mitral  stenosis.   4. The aortic valve is tricuspid. Aortic valve regurgitation is trivial.  No aortic stenosis is present.   5. The inferior vena cava is dilated in size with >50% respiratory  variability, suggesting right atrial pressure of 8 mmHg.    Echo 01/19/21: IMPRESSIONS     1. Left ventricular ejection fraction, by  estimation, is 45 to 50%. The  left ventricle has mildly decreased function. The left ventricle  demonstrates regional wall motion abnormalities (see scoring  diagram/findings for description). Left ventricular  diastolic parameters are consistent with Grade I diastolic dysfunction  (impaired relaxation). There is severe akinesis of the left ventricular,  basal inferior wall.   2. Right ventricular systolic function is normal. The right ventricular  size is normal. There is normal pulmonary artery systolic pressure.   3. Right atrial size was mildly dilated.   4. The mitral valve is normal in structure. Trivial mitral valve  regurgitation.   5. The aortic valve is tricuspid. Aortic valve regurgitation is mild.   6. There is dilatation of the ascending aorta.   ASSESSMENT AND PLAN:  1. Coronary disease status post remote stenting of the left circumflex in 2000 with a bare-metal stent. Cath  in 2016 showed  Patent stents in LCx. New disease in diagonal and ostial nondominant RCA  that correlate with stress test results. In 2022  had High risk Myoview study. EF down to 25-30%. Cardiac cath demonstrated critical ostial LAD stenosis. Now s/p successful PCI with atherectomy and DES in April 2022.  Fortunately LV function improved significantly with revascularization. Continue ASA and metoprolol  2. Acute on chronic systolic CHF secondary to ischemic CM. S/p revascularization as noted. Will continue Toprol XL to 25 mg daily. continue Aldactone to 12.5 mg daily. Increase  previously unable to afford Entresto. Continue  losartan to 50 mg daily. BP is well controlled today.  Would avoid SGLT 2 inhibitor given hx of bladder yeast infection. Repeat Echo showed significant improvement in EF to 45-50%.   3. Hypertension- well controlled today.   4. Hyperlipidemia- on Crestor. Last LDL 31  5. Diabetes mellitus.  Per Dr. Sol Passer. On metformin  6. UTI with obstructive uropathy and hematuria. No active  symptoms   7. S/p diverticular bleed. Resolved.      Disposition:   FU with me in 6 months   Signed, Royce Sciara [place], MD  03/27/2023 2:53 PM    [facility] [address] [place], [place], [place]  [address] Phone: [phone]; Fax: [phone]

## 2023-03-27 ENCOUNTER — Ambulatory Visit: Payer: Medicare Other | Admitting: Podiatry

## 2023-03-27 ENCOUNTER — Encounter: Payer: Self-pay | Admitting: Cardiology

## 2023-03-27 ENCOUNTER — Ambulatory Visit: Payer: Medicare Other | Attending: Cardiology | Admitting: Cardiology

## 2023-03-27 VITALS — BP 126/64 | HR 70 | Ht 73.0 in | Wt 177.8 lb

## 2023-03-27 DIAGNOSIS — I2510 Atherosclerotic heart disease of native coronary artery without angina pectoris: Secondary | ICD-10-CM

## 2023-03-27 DIAGNOSIS — E782 Mixed hyperlipidemia: Secondary | ICD-10-CM

## 2023-03-27 DIAGNOSIS — I10 Essential (primary) hypertension: Secondary | ICD-10-CM | POA: Diagnosis not present

## 2023-03-27 DIAGNOSIS — I5022 Chronic systolic (congestive) heart failure: Secondary | ICD-10-CM

## 2023-03-27 NOTE — Patient Instructions (Signed)
Medication Instructions:  Your physician recommends that you continue on your current medications as directed. Please refer to the Current Medication list given to you today.  *If you need a refill on your cardiac medications before your next appointment, please call your pharmacy*     Follow-Up: At [facility], you and your health needs are our priority.  As part of our continuing mission to provide you with exceptional heart care, we have created designated Provider Care Teams.  These Care Teams include your primary Cardiologist (physician) and Advanced Practice Providers (APPs -  Physician Assistants and Nurse Practitioners) who all work together to provide you with the care you need, when you need it.  We recommend signing up for the patient portal called "MyChart".  Sign up information is provided on this After Visit Summary.  MyChart is used to connect with patients for Virtual Visits (Telemedicine).  Patients are able to view lab/test results, encounter notes, upcoming appointments, etc.  Non-urgent messages can be sent to your provider as well.   To learn more about what you can do with MyChart, go to [email or web address].    Your next appointment:   6 month(s): call in November to schedule next appointment   Provider:   Peter [place], MD

## 2023-03-28 ENCOUNTER — Ambulatory Visit: Payer: Medicare Other | Admitting: Podiatry

## 2023-03-28 DIAGNOSIS — E1142 Type 2 diabetes mellitus with diabetic polyneuropathy: Secondary | ICD-10-CM

## 2023-03-28 DIAGNOSIS — L84 Corns and callosities: Secondary | ICD-10-CM

## 2023-03-28 DIAGNOSIS — M79675 Pain in left toe(s): Secondary | ICD-10-CM | POA: Diagnosis not present

## 2023-03-28 DIAGNOSIS — M79674 Pain in right toe(s): Secondary | ICD-10-CM

## 2023-03-28 DIAGNOSIS — B351 Tinea unguium: Secondary | ICD-10-CM | POA: Diagnosis not present

## 2023-03-28 NOTE — Progress Notes (Signed)
  Subjective:  Patient ID: John Gamble, male    DOB: 30-Oct-1930,  MRN: [id number]  Chief Complaint  Patient presents with   Nail Problem    Routine Foot Care-nail trim     89 y.o. male presents with the above complaint. History confirmed with patient. Patient presenting for follow-up of small ulceration on the plantar right forefoot.  Also previously had some calluses on the toes that are giving him pain third toe on the left foot.  He has been putting Betadine on all of the callused areas.  As well as the wound on the right plantar forefoot.  Denies any drainage or redness.  Objective:  Physical Exam: warm, good capillary refill nail exam onychomycosis of the toenails, onycholysis, and dystrophic nails. Peeling dry skin bilateral foot, mild erythematous rash.  Attention directed the central plantar right forefoot there is noted to be hyperkeratotic lesion with improved appearance from prior.  There is no open wound at this time.  Is healed and with no erythema drainage or sinus tract  DP pulses palpable, PT pulses palpable, and protective sensation diminished to forefoot.  Left Foot:  Pain with palpation of nails due to elongation and dystrophic growth. Hyperkeratotic lesion distal tuft of 2nd toe left foot.  Right Foot: Pain with palpation of nails due to elongation and dystrophic growth. Hyperkeratotic lesion sub 3rd met head right foot and distal right 3rd toe.  Assessment:   1. Pain due to onychomycosis of toenails of both feet   2. Pre-ulcerative calluses   3. DM type 2 with diabetic peripheral neuropathy (HCC)          Plan:  Patient was evaluated and treated and all questions answered.  #Hyperkeratotic lesions/pre ulcerative calluses present sub 3rd met head right foot, distal left 2nd toe and distal right 3rd toe # Blood blister underlying right plantar forefoot subthird met head All symptomatic hyperkeratoses were safely debrided with a sterile #15 blade to patient's  level of comfort without incident. We discussed preventative and palliative care of these lesions including supportive and accommodative shoegear, padding, prefabricated and custom molded accommodative orthoses, use of a pumice stone and lotions/creams daily.  #Onychomycosis with pain  -Nails palliatively debrided as below. -Educated on self-care  Procedure: Nail Debridement Rationale: Pain Type of Debridement: manual, sharp debridement. Instrumentation: Nail nipper, rotary burr. Number of Nails: 10   Return in about 3 months (around 06/28/2023) for [facility].         Corinna Gab, DPM [facility]

## 2023-03-30 ENCOUNTER — Ambulatory Visit: Payer: Medicare Other | Admitting: Internal Medicine

## 2023-03-30 ENCOUNTER — Encounter: Payer: Self-pay | Admitting: Internal Medicine

## 2023-03-30 VITALS — BP 122/60 | HR 78 | Ht 73.0 in | Wt 174.0 lb

## 2023-03-30 DIAGNOSIS — N3941 Urge incontinence: Secondary | ICD-10-CM | POA: Diagnosis not present

## 2023-03-30 DIAGNOSIS — K5900 Constipation, unspecified: Secondary | ICD-10-CM | POA: Diagnosis not present

## 2023-03-30 DIAGNOSIS — K5731 Diverticulosis of large intestine without perforation or abscess with bleeding: Secondary | ICD-10-CM | POA: Diagnosis not present

## 2023-03-30 NOTE — Patient Instructions (Signed)
Take 2 tablespoons of Citrucel in 14 ounces of water or juice daily.  Please follow up as needed  _______________________________________________________  If your blood pressure at your visit was 140/90 or greater, please contact your primary care physician to follow up on this.  _______________________________________________________  If you are age 86 or older, your body mass index should be between 23-30. Your Body mass index is 22.96 kg/m. If this is out of the aforementioned range listed, please consider follow up with your Primary Care Provider.  If you are age 87 or younger, your body mass index should be between 19-25. Your Body mass index is 22.96 kg/m. If this is out of the aformentioned range listed, please consider follow up with your Primary Care Provider.   ________________________________________________________  The [facility] providers would like to encourage you to use [facility] to communicate with providers for non-urgent requests or questions.  Due to long hold times on the telephone, sending your provider a message by [facility] may be a faster and more efficient way to get a response.  Please allow 48 business hours for a response.  Please remember that this is for non-urgent requests.  _______________________________________________________

## 2023-03-30 NOTE — Progress Notes (Signed)
HISTORY OF PRESENT ILLNESS:  John Gamble is a pleasant 89 y.o. male with multiple medical problems as listed below who presents today for posthospital follow-up after being hospitalized with a self-limited acute diverticular bleed.  He is accompanied today by his wife.  I last saw the patient in January 2023 when he was hospitalized with an acute diverticular bleed.  He underwent colonoscopy and upper endoscopy at that time.  Colonoscopy revealed pandiverticulosis but was otherwise normal.  Upper endoscopy revealed mild esophagitis and duodenitis, but was otherwise normal.  He did well until Dec 18, 2022 when he was admitted to the hospital with recurrent painless hematochezia.  He was felt to have diverticular bleeding.  Initial hemoglobin 12.6.  CT angiogram at that time was negative for active bleeding.  His bleeding resolved and he was discharged home 3 days later.  Hemoglobin at the time of discharge was 11.4.  Since discharge she has done well.  No further bleeding.  He does tell me that he has issues with cost patient often followed by loose stools.  On rare occasions he does describe urgency to the point of incontinence (for which he will wear protective undergarments when he is out).  He had been on MiraLAX, but no longer.  REVIEW OF SYSTEMS:  All non-GI ROS negative unless otherwise stated in the HPI except for hard of hearing, arthritis  Past Medical History:  Diagnosis Date   Coronary artery disease    Diverticulitis    Hyperlipidemia    Hypertension    MI, old    OA (osteoarthritis)    Pneumonia    hx of 2013     Past Surgical History:  Procedure Laterality Date   BIOPSY  08/04/2021   Procedure: BIOPSY;  Surgeon: Hilarie Fredrickson, MD;  Location: [facility] ENDOSCOPY;  Service: Endoscopy;;   CARDIAC CATHETERIZATION  01/26/1999   EF 55%   CARDIAC CATHETERIZATION N/A 05/06/2015   Procedure: Left Heart Cath and Coronary Angiography;  Surgeon: Peter M Swaziland, MD;  Location: [facility] INVASIVE CV  LAB;  Service: Cardiovascular;  Laterality: N/A;   CARDIOVASCULAR STRESS TEST  02/22/2010   EF 61%   COLONOSCOPY WITH PROPOFOL N/A 08/04/2021   Procedure: COLONOSCOPY WITH PROPOFOL;  Surgeon: Hilarie Fredrickson, MD;  Location: [facility] ENDOSCOPY;  Service: Endoscopy;  Laterality: N/A;   corneal implant     left   CORONARY ATHERECTOMY N/A 11/04/2020   Procedure: CORONARY ATHERECTOMY;  Surgeon: Swaziland, Peter M, MD;  Location: [facility] INVASIVE CV LAB;  Service: Cardiovascular;  Laterality: N/A;   CORONARY STENT INTERVENTION N/A 11/04/2020   Procedure: CORONARY STENT INTERVENTION;  Surgeon: Swaziland, Peter M, MD;  Location: [facility] INVASIVE CV LAB;  Service: Cardiovascular;  Laterality: N/A;   CORONARY STENT PLACEMENT  2000   BMS LEFT CIRCUMFLEX CORONARY   CORONARY ULTRASOUND/IVUS N/A 11/04/2020   Procedure: Intravascular Ultrasound/IVUS;  Surgeon: Swaziland, Peter M, MD;  Location: [facility] INVASIVE CV LAB;  Service: Cardiovascular;  Laterality: N/A;   ESOPHAGOGASTRODUODENOSCOPY N/A 08/04/2021   Procedure: ESOPHAGOGASTRODUODENOSCOPY (EGD);  Surgeon: Hilarie Fredrickson, MD;  Location: [facility] ENDOSCOPY;  Service: Endoscopy;  Laterality: N/A;   INGUINAL HERNIA REPAIR     REPLACEMENT TOTAL KNEE     RIGHT/LEFT HEART CATH AND CORONARY ANGIOGRAPHY N/A 10/27/2020   Procedure: RIGHT/LEFT HEART CATH AND CORONARY ANGIOGRAPHY;  Surgeon: Swaziland, Peter M, MD;  Location: [facility] INVASIVE CV LAB;  Service: Cardiovascular;  Laterality: N/A;   TOTAL KNEE ARTHROPLASTY Left 09/19/2014   Procedure: LEFT TOTAL KNEE ARTHROPLASTY;  Surgeon: Eugenia Mcalpine, MD;  Location: [facility] ORS;  Service: Orthopedics;  Laterality: Left;    Social History SHELBY ROBLYER  reports that he quit smoking about 34 years ago. His smoking use included cigarettes. He started smoking about 84 years ago. He has a 25 pack-year smoking history. He has never used smokeless tobacco. He reports that he does not drink alcohol and does not use drugs.  family history includes Cancer in his father and  mother.  Allergies  Allergen Reactions   Atorvastatin Other (See Comments)    myalgias   Oysters [Shellfish Allergy] Nausea Only   Sulfa Antibiotics Nausea And Vomiting       PHYSICAL EXAMINATION: Vital signs: BP 122/60   Pulse 78   Ht 6\' 1"  (1.854 m)   Wt 174 lb (78.9 kg)   BMI 22.96 kg/m   Constitutional: generally well-appearing, no acute distress Psychiatric: alert and oriented x3, cooperative Eyes: extraocular movements intact, anicteric, conjunctiva pink Mouth: oral pharynx moist, no lesions Neck: supple no lymphadenopathy Cardiovascular: heart regular rate and rhythm, no murmur Lungs: clear to auscultation bilaterally Abdomen: soft, nontender, nondistended, no obvious ascites, no peritoneal signs, normal bowel sounds, no organomegaly Rectal: Omitted Extremities: no clubbing, cyanosis, or lower extremity edema bilaterally Skin: no lesions on visible extremities Neuro: No focal deficits.  Cranial nerves intact  ASSESSMENT:  1.  Diverticular bleed May 2024.  Resolved without recurrence or the need for intervention. 2.  Colonoscopy and upper endoscopy January 2023 as described 3.  Irregular bowel habits as described above 4.  General Medical problems.  Stable   PLAN:  1.  Discussion today on diverticular disease including diverticular bleeding and diverticulitis.  Multiple questions answered to their satisfaction 2.  Recommend Citrucel 2 tablespoons daily for irregular bowel habits 3.  Resume general medical care with PCP.  GI follow-up as needed Total time of 30 minutes was spent preparing to see the patient, reviewing myriad of data, obtaining comprehensive history, performing medically appropriate physical examination, counseling and educating the patient and his wife regarding the above listed issues, correcting symptomatic therapies, and documenting clinical information in the health record

## 2023-05-30 ENCOUNTER — Ambulatory Visit: Payer: Medicare Other | Admitting: Podiatry

## 2023-05-30 ENCOUNTER — Encounter: Payer: Self-pay | Admitting: Podiatry

## 2023-05-30 VITALS — BP 158/68

## 2023-05-30 DIAGNOSIS — B351 Tinea unguium: Secondary | ICD-10-CM

## 2023-05-30 DIAGNOSIS — L84 Corns and callosities: Secondary | ICD-10-CM

## 2023-05-30 DIAGNOSIS — E1142 Type 2 diabetes mellitus with diabetic polyneuropathy: Secondary | ICD-10-CM

## 2023-05-30 DIAGNOSIS — M79674 Pain in right toe(s): Secondary | ICD-10-CM

## 2023-05-30 DIAGNOSIS — M79675 Pain in left toe(s): Secondary | ICD-10-CM | POA: Diagnosis not present

## 2023-05-30 NOTE — Progress Notes (Signed)
Subjective:  Patient ID: John Gamble, male    DOB: [birth year],  MRN: [id number]  No chief complaint on file.   89 y.o. male presents with the above complaint. History confirmed with patient. Patient presenting for routine foot care and callus trimming. Has history DM2 with neuropathy.  Objective:  Physical Exam: warm, good capillary refill nail exam onychomycosis of the toenails, onycholysis, and dystrophic nails. Peeling dry skin bilateral foot, mild erythematous rash.  DP pulses palpable, PT pulses palpable, and protective sensation diminished to forefoot.  Left Foot:  Pain with palpation of nails due to elongation and dystrophic growth. Hyperkeratotic lesion distal tuft of 2nd toe left foot.  Right Foot: Pain with palpation of nails due to elongation and dystrophic growth. Hyperkeratotic lesion sub 3rd met head right foot and distal right 3rd toe.  Assessment:   1. Pain due to onychomycosis of toenails of both feet   2. Pre-ulcerative calluses   3. DM type 2 with diabetic peripheral neuropathy (HCC)           Plan:  Patient was evaluated and treated and all questions answered.  #Hyperkeratotic lesions/pre ulcerative calluses present sub 3rd met head right foot, distal left 2nd toe and distal right 3rd toe # Blood blister underlying right plantar forefoot subthird met head All symptomatic hyperkeratoses were safely debrided with a sterile #15 blade to patient's level of comfort without incident. We discussed preventative and palliative care of these lesions including supportive and accommodative shoegear, padding, prefabricated and custom molded accommodative orthoses, use of a pumice stone and lotions/creams daily.  #Onychomycosis with pain  -Nails palliatively debrided as below. -Educated on self-care  Procedure: Nail Debridement Rationale: Pain Type of Debridement: manual, sharp debridement. Instrumentation: Nail nipper, rotary burr. Number of Nails: 10   Return  in about 3 months (around 08/30/2023).         Corinna Gab, DPM [facility] / [facility]

## 2023-06-23 ENCOUNTER — Telehealth: Payer: Self-pay | Admitting: Podiatry

## 2023-06-23 ENCOUNTER — Other Ambulatory Visit: Payer: Self-pay | Admitting: Podiatry

## 2023-06-23 MED ORDER — KETOCONAZOLE 2 % EX CREA: 1.0000 | TOPICAL_CREAM | Freq: Every day | CUTANEOUS | 0 refills | Status: DC

## 2023-06-23 NOTE — Telephone Encounter (Signed)
Patients spouse Irving Burton is calling on the patient's Cordle) behalf in regards to getting a cream/lotion prescription refilled. [facility]AL [facility] [address]

## 2023-06-23 NOTE — Progress Notes (Signed)
ketoconazole

## 2023-07-31 ENCOUNTER — Other Ambulatory Visit: Payer: Self-pay | Admitting: Podiatry

## 2023-07-31 ENCOUNTER — Telehealth: Payer: Self-pay | Admitting: Podiatry

## 2023-07-31 DIAGNOSIS — B353 Tinea pedis: Secondary | ICD-10-CM

## 2023-07-31 MED ORDER — KETOCONAZOLE 2 % EX CREA
1.0000 | TOPICAL_CREAM | Freq: Every day | CUTANEOUS | 0 refills | Status: DC
Start: 2023-07-31 — End: 2023-09-29

## 2023-07-31 NOTE — Telephone Encounter (Signed)
Pts wife called to request a refill on the ketoconazole (NIZORAL) 2 % cream. [facility] [address] [facility] [place].

## 2023-07-31 NOTE — Progress Notes (Signed)
Ketoconazole 2% cream refilled for tinea pedis.

## 2023-08-08 ENCOUNTER — Encounter: Payer: Self-pay | Admitting: Podiatry

## 2023-08-08 ENCOUNTER — Ambulatory Visit: Payer: Medicare Other | Admitting: Podiatry

## 2023-08-08 DIAGNOSIS — M79674 Pain in right toe(s): Secondary | ICD-10-CM | POA: Diagnosis not present

## 2023-08-08 DIAGNOSIS — L84 Corns and callosities: Secondary | ICD-10-CM | POA: Diagnosis not present

## 2023-08-08 DIAGNOSIS — M7741 Metatarsalgia, right foot: Secondary | ICD-10-CM | POA: Diagnosis not present

## 2023-08-08 DIAGNOSIS — M2041 Other hammer toe(s) (acquired), right foot: Secondary | ICD-10-CM

## 2023-08-08 DIAGNOSIS — B351 Tinea unguium: Secondary | ICD-10-CM | POA: Diagnosis not present

## 2023-08-08 DIAGNOSIS — M2042 Other hammer toe(s) (acquired), left foot: Secondary | ICD-10-CM

## 2023-08-08 DIAGNOSIS — M7742 Metatarsalgia, left foot: Secondary | ICD-10-CM

## 2023-08-08 DIAGNOSIS — E1142 Type 2 diabetes mellitus with diabetic polyneuropathy: Secondary | ICD-10-CM

## 2023-08-08 DIAGNOSIS — M79675 Pain in left toe(s): Secondary | ICD-10-CM | POA: Diagnosis not present

## 2023-08-08 NOTE — Progress Notes (Signed)
  Subjective:  Patient ID: John Gamble, male    DOB: [birth year],  MRN: [id number]  Chief Complaint  Patient presents with   [facility]    DFC, 6.2, ASA 70    89 y.o. male presents with the above complaint. History confirmed with patient. Patient presenting for routine foot care and callus trimming. Has history DM2 with neuropathy.  Previous tinea pedis appears resolved.  Objective:  Physical Exam: warm to cool skin temperature gradient, good capillary refill.  Pedal skin atrophic. nail exam onychomycosis of the toenails, onycholysis, and dystrophic nails. Peeling dry skin bilateral foot  DP pulses palpable, PT pulses palpable, and protective sensation diminished to forefoot.  Left Foot:  Pain with palpation of nails due to elongation and dystrophic growth. Hyperkeratotic lesion distal tuft of 3nd toe left foot.  Right Foot: Pain with palpation of nails due to elongation and dystrophic growth. Hyperkeratotic lesion sub 3rd met head right foot and distal right 3rd toe. Fat pad atrophy appreciated to bilateral forefoot  Assessment:   1. Pain due to onychomycosis of toenails of both feet   2. DM type 2 with diabetic peripheral neuropathy (HCC)   3. Callus of foot   4. Metatarsalgia of both feet   5. Acquired hammertoes of both feet           Plan:  Patient was evaluated and treated and all questions answered.  #Hyperkeratotic lesions/pre ulcerative calluses present sub 3rd met head right foot, distal left 3nd toe and distal right 3rd toe All symptomatic hyperkeratoses were safely debrided with a sterile #15 blade to patient's level of comfort without incident. We discussed preventative and palliative care of these lesions including supportive and accommodative shoegear, padding, prefabricated and custom molded accommodative orthoses, use of a pumice stone and lotions/creams daily. -Aggravated due to patient's fat pad atrophy.  Crest pads x 2 applied for the hammertoes, metatarsal pad  applied to the right foot  #Onychomycosis with pain  -Nails palliatively debrided as below. -Educated on self-care  Procedure: Nail Debridement Rationale: Pain Type of Debridement: manual, sharp debridement. Instrumentation: Nail nipper, rotary burr. Number of Nails: 10  # Diabetes with neuropathy Patient educated on diabetes. Discussed proper diabetic foot care and discussed risks and complications of disease. Educated patient in depth on reasons to return to the office immediately should he/she discover anything concerning or new on the feet. All questions answered. Discussed proper shoes as well.   -Patient would benefit from custom diabetic shoes with 3 sets of custom inserts to offload the areas of hyperkeratotic tissue due to fat pad atrophy, preulcerative calluses to the right forefoot and to the third toe hammertoes.  Prescription written. -I certify that this diagnosis represents a distinct and separate diagnosis that requires evaluation and treatment separate from other procedures or diagnosis   Return in about 3 months (around 11/06/2023) for Diabetic Foot Care.         Ethan Saddler, DPM [facility] / [facility]

## 2023-08-10 ENCOUNTER — Telehealth: Payer: Self-pay | Admitting: Urology

## 2023-08-10 NOTE — Telephone Encounter (Signed)
 Wife called stating that none of the pharmacy have this and it has to be a written rx, Urea Cream 40% , [facility] [address].

## 2023-09-09 ENCOUNTER — Other Ambulatory Visit: Payer: Self-pay | Admitting: Cardiology

## 2023-09-23 NOTE — Progress Notes (Unsigned)
 Cardiology Office Note    Date:  09/29/2023   ID:  Gamble, John 03/20/1931, MRN [id number]  PCP:  Olive Bass, MD  Cardiologist:  Lakechia Nay [place], MD   Chief Complaint  Patient presents with   Follow-up    6 months.       History of Present Illness: John Gamble is a 89 y.o. male who presents for follow up CAD. He has a history of coronary disease and is status post stenting of the left circumflex coronary in 2000. He had a normal stress Myoview in July of 2011. He also has a history of hypertension and hyperlipidemia.   In September 2016 he had a nuclear stress test that was abnormal. This led to a cardiac cath.. Stents were patent but he did have ostial RCA (nondominant) and diagonal disease. Since he was asymptomatic medical therapy was recommended.   He did have Covid 19 infection in January 2021. He was admitted to [facility] with hypoxia and bilateral PNA.  He brings a diary of his BP readings and his BP has been excellent. He notes that since his Covid infection his energy is not as good and he has more dyspnea on exertion. He had persistent DOE and some arm pain.   He was evaluated with a Myoview study which was high risk with EF 25% and multiple perfusion abnormalities. This led to a cardiac cath which showed patent stents in the dominant LCx. There was critical ostial LAD disease with heavy calcification. Fortunately right heart and LV filling pressures were ok. Patient returned for complex PCI with atherectomy on 11/04/20. He underwent  PCI involving atherectomy and stenting of the ostial LAD with DES. An excellent result was obtained.  John Gamble was held due to recurrent UTI. A1c was 6.6%. He was continued on ARB ( unable to afford Entresto ). and Toprol XL and aldactone were added. With revascularization and medical therapy there was significant improvement in LV function with EF up to 45-50%.   He was admitted in January 2023 with lower GI bleed. Colonoscopy showed  diverticuli. Upper EGD showed mild erosive gastritis. Hgb dropped to 10.3. managed medically. Plavix initially held but resumed at DC. BP meds reduced due to hypotension (losartan and aldactone). Some NSVT noted on monitor but asymptomatic. On follow up BP was high and losartan and aldactone resumed.  He was admitted in August 2023 with lower GI bleed felt to be diverticular. Since he had pan EGD in Jan no further work up needed.   He was admitted in May 2024 with rectal bleeding. CT GI showed no source of bleeding but there was some sigmoid diverticulitis which was treated with antibiotics. Hgb remained stable. No colonoscopy indicated per GI.   On follow up today he is doing well. Now 89 yo. Still goes to the gym 4-5 times a week and works out on [employer] and bike. Denies any chest pain or SOB. No palpitations. No bleeding. BP has been consistently high on home readings.      Past Medical History:  Diagnosis Date   Coronary artery disease    Diverticulitis    Hyperlipidemia    Hypertension    MI, old    OA (osteoarthritis)    Pneumonia    hx of 2013     Past Surgical History:  Procedure Laterality Date   BIOPSY  08/04/2021   Procedure: BIOPSY;  Surgeon: Hilarie Fredrickson, MD;  Location: [facility] ENDOSCOPY;  Service: Endoscopy;;  CARDIAC CATHETERIZATION  01/26/1999   EF 55%   CARDIAC CATHETERIZATION N/A 05/06/2015   Procedure: Left Heart Cath and Coronary Angiography;  Surgeon: Kendalyn Cranfield M [place], MD;  Location: [facility] INVASIVE CV LAB;  Service: Cardiovascular;  Laterality: N/A;   CARDIOVASCULAR STRESS TEST  02/22/2010   EF 61%   COLONOSCOPY WITH PROPOFOL N/A 08/04/2021   Procedure: COLONOSCOPY WITH PROPOFOL;  Surgeon: Hilarie Fredrickson, MD;  Location: [facility] ENDOSCOPY;  Service: Endoscopy;  Laterality: N/A;   corneal implant     left   CORONARY ATHERECTOMY N/A 11/04/2020   Procedure: CORONARY ATHERECTOMY;  Surgeon: [place], Shagun Wordell M, MD;  Location: [facility] INVASIVE CV LAB;  Service: Cardiovascular;  Laterality:  N/A;   CORONARY STENT INTERVENTION N/A 11/04/2020   Procedure: CORONARY STENT INTERVENTION;  Surgeon: [place], Marisella Puccio M, MD;  Location: [facility] INVASIVE CV LAB;  Service: Cardiovascular;  Laterality: N/A;   CORONARY STENT PLACEMENT  2000   BMS LEFT CIRCUMFLEX CORONARY   CORONARY ULTRASOUND/IVUS N/A 11/04/2020   Procedure: Intravascular Ultrasound/IVUS;  Surgeon: [place], Wonda Goodgame M, MD;  Location: [facility] INVASIVE CV LAB;  Service: Cardiovascular;  Laterality: N/A;   ESOPHAGOGASTRODUODENOSCOPY N/A 08/04/2021   Procedure: ESOPHAGOGASTRODUODENOSCOPY (EGD);  Surgeon: Hilarie Fredrickson, MD;  Location: [facility] ENDOSCOPY;  Service: Endoscopy;  Laterality: N/A;   INGUINAL HERNIA REPAIR     REPLACEMENT TOTAL KNEE     RIGHT/LEFT HEART CATH AND CORONARY ANGIOGRAPHY N/A 10/27/2020   Procedure: RIGHT/LEFT HEART CATH AND CORONARY ANGIOGRAPHY;  Surgeon: [place], Veryl Winemiller M, MD;  Location: [facility] INVASIVE CV LAB;  Service: Cardiovascular;  Laterality: N/A;   TOTAL KNEE ARTHROPLASTY Left 09/19/2014   Procedure: LEFT TOTAL KNEE ARTHROPLASTY;  Surgeon: Eugenia Mcalpine, MD;  Location: [facility] ORS;  Service: Orthopedics;  Laterality: Left;     Current Outpatient Medications  Medication Sig Dispense Refill   acyclovir (ZOVIRAX) 800 MG tablet Take 800 mg by mouth daily.      aspirin EC 81 MG tablet Take 1 tablet (81 mg total) by mouth daily. 30 tablet 11   dorzolamide (TRUSOPT) 2 % ophthalmic solution Place 1 drop into both eyes 2 (two) times daily.     finasteride (PROSCAR) 5 MG tablet Take 1 tablet (5 mg total) by mouth daily.     Glucosamine-Chondroitin 250-200 MG TABS Take 2 tablets by mouth 2 (two) times daily.     glucose blood (ONETOUCH ULTRA) test strip 1 each by Other route daily.     Lancets (ONETOUCH DELICA PLUS LANCET33G) MISC 1 each by Other route daily.     losartan (COZAAR) 100 MG tablet Take 1 tablet (100 mg total) by mouth daily. 90 tablet 3   metFORMIN (GLUCOPHAGE-XR) 500 MG 24 hr tablet Take 500 mg by mouth 2 (two) times daily.      metoprolol succinate (TOPROL-XL) 25 MG 24 hr tablet TAKE 1 TABLET (25 MG TOTAL) BY MOUTH DAILY. 90 tablet 1   Multiple Vitamin (MULTIVITAMIN WITH MINERALS) TABS tablet Take 1 tablet by mouth daily. Centrum Silver     Omega-3 Fatty Acids (FISH OIL) 1200 MG CAPS Take 1,200 mg by mouth daily.     ONETOUCH ULTRA test strip daily.     pantoprazole (PROTONIX) 40 MG tablet Take 1 tablet (40 mg total) by mouth daily at 6 (six) AM. 30 tablet 0   polyethylene glycol (MIRALAX / GLYCOLAX) packet Take 17 g by mouth daily as needed for mild constipation. Mix with 8 oz liquid and drink     prednisoLONE acetate (PRED FORTE) 1 %  ophthalmic suspension Place 1 drop into the left eye at bedtime.     rosuvastatin (CRESTOR) 20 MG tablet Take 20 mg by mouth daily.     silodosin (RAPAFLO) 8 MG CAPS capsule Take 8 mg by mouth daily.     spironolactone (ALDACTONE) 25 MG tablet TAKE 1/2 TABLET BY MOUTH EVERY DAY 45 tablet 3   Zinc 50 MG CAPS Take 50 mg by mouth daily with supper.      No current facility-administered medications for this visit.    Allergies:   Atorvastatin, Oysters [shellfish allergy], and Sulfa antibiotics    Social History:  The patient  reports that he quit smoking about 34 years ago. His smoking use included cigarettes. He started smoking about 84 years ago. He has a 25 pack-year smoking history. He has never used smokeless tobacco. He reports that he does not drink alcohol and does not use drugs.   Family History:  The patient's family history includes Cancer in his father and mother.    ROS:  Please see the history of present illness.   Otherwise, review of systems are positive for none.   All other systems are reviewed and negative.    PHYSICAL EXAM: VS:  BP (!) 140/70 (BP Location: Left Arm, Patient Position: Sitting)   Pulse (!) 47   Ht 6\' 1"  (1.854 m)   Wt 176 lb (79.8 kg)   BMI 23.22 kg/m  , BMI Body mass index is 23.22 kg/m. GENERAL:  Well appearing WM in NAD HEENT:  PERRL,  EOMI, sclera are clear. Oropharynx is clear. NECK:  No jugular venous distention, carotid upstroke brisk and symmetric, no bruits, no thyromegaly or adenopathy LUNGS:  Clear to auscultation bilaterally CHEST:  Unremarkable HEART:  RRR with frequent extrasystoles,  PMI not displaced or sustained,S1 and S2 within normal limits, no S3, no S4: no clicks, no rubs, no murmurs ABD:  Soft, nontender. BS +, no masses or bruits. No hepatomegaly, no splenomegaly EXT:  2 + pulses throughout, no edema, no cyanosis no clubbing. Radial site without hematoma.  SKIN:  Warm and dry.  No rashes NEURO:  Alert and oriented x 3. Cranial nerves II through XII intact. PSYCH:  Cognitively intact  EKG Interpretation Date/Time:  Friday September 29 2023 16:12:42 EST Ventricular Rate:  47 PR Interval:  170 QRS Duration:  100 QT Interval:  416 QTC Calculation: 368 R Axis:   -25  Text Interpretation: Sinus bradycardia Minimal voltage criteria for LVH, may be normal variant ( R in aVL ) Nonspecific ST abnormality When compared with ECG of 18-Dec-2022 23:03, Premature ventricular complexes are no longer Present Vent. rate has decreased BY  32 BPM QT has shortened Confirmed by [place], Mannat Benedetti [phone]) on 09/29/2023 4:22:53 PM    Recent Labs: 12/18/2022: ALT 18 12/21/2022: BUN 11; Creatinine, Ser 0.99; Hemoglobin 11.4; Magnesium 2.1; Platelets 335; Potassium 3.7; Sodium 136    Lipid Panel    Component Value Date/Time   CHOL 91 11/05/2020 0243   TRIG 80 11/05/2020 0243   HDL 35 (L) 11/05/2020 0243   CHOLHDL 2.6 11/05/2020 0243   VLDL 16 11/05/2020 0243   LDLCALC 40 11/05/2020 0243      Wt Readings from Last 3 Encounters:  09/29/23 176 lb (79.8 kg)  03/30/23 174 lb (78.9 kg)  03/27/23 177 lb 12.8 oz (80.6 kg)      Other studies Reviewed: Additional studies/ records that were reviewed today include:  November 2017. Cholesterol 164, triglycerides 300, HDL 32 LDL  83. A1c 6.7%. Dated 12/28/16: glucose 150, A1c 7%.  Cholesterol 94, triglyderdes 198, HDL 30, LDL 38. Other chemistries normal.  Dated 07/06/17. Glucose 155, otherwise CMET normal. Cholesterol 109, triglycerides 164, HDL 35, LDL 59.  A1c 6.5%. Dated 07/17/18: cholesterol 108, triglycerides 167, HDL 38, LDL 55. Glucose 142. Otherwise CMET normal. A1c 6.7% Dated 07/22/19: cholesterol 103, triglycerides 187, HDL 36, LDL 53.  Dated 08/17/19: glucose 147. Otherwise CMET normal. Hgb 13.9.  Dated 01/23/20: Glucose 127. Otherwise CMET normal. Dated 07/27/20: glucose 165, otherwise CMET normal. Cholesterol 108, triglycerides 155, HDL 35, LDL 54. A1c 6.6%.    Myoview 10/21/20: Study Highlights  Nuclear stress EF: 22%. The left ventricular ejection fraction is severely decreased (<30%). Defect 1: There is a defect present in the mid anterior, mid anteroseptal, mid inferolateral, mid anterolateral, apical anterior, apical inferior, apical lateral and apex location. Findings consistent with prior large anterior apical myocardial infarction. This is a high risk study. There is no evidence of ischemia . He has had a previous anterior apical MI   Cardiac cath 10/27/20:  RIGHT/LEFT HEART CATH AND CORONARY ANGIOGRAPHY    Conclusion    Ost LAD to Prox LAD lesion is 95% stenosed. 1st Diag lesion is 70% stenosed. Previously placed Ost Cx to Prox Cx stent (unknown type) is widely patent. Previously placed LPDA stent (unknown type) is widely patent. Ost RCA to Prox RCA lesion is 90% stenosed. There is severe left ventricular systolic dysfunction. LV end diastolic pressure is normal. The left ventricular ejection fraction is 25-35% by visual estimate.   1. Severe 2 vessel obstructive CAD.    - 90% ostial LAD. Severely calcified. 70% first diagonal    - patent stents in the proximal LCx and left PDA    - 90% ostial nondominant RCA 2. Severe LV dysfunction with regional wall motion abnormalities. 3. Normal LV filling pressures 4. Normal right heart  pressures. 5. Preserved cardiac output.    Plan: recommend staged complex  PCI of the ostial LAD with atherectomy and stenting. The diagonal and RCA disease are old and involve small vessels. While CABG would be an option given his advanced age I would favor a percutaneous approach.    PCI 11/04/20: Procedures  CORONARY STENT INTERVENTION  CORONARY ATHERECTOMY  Intravascular Ultrasound/IVUS    Conclusion    Ost LAD to Prox LAD lesion is 95% stenosed. A drug-eluting stent was successfully placed using a SYNERGY XD 3.50X16. Post intervention, there is a 0% residual stenosis.   1. Successful PCI of the ostial LAD with IVUS guidance, orbital atherectomy and DES x 1   Plan: DAPT for at least 6 months. Will observe overnight and anticipate DC in am.     Recommendations  Antiplatelet/Anticoag Recommend uninterrupted dual antiplatelet therapy with Aspirin 81mg  daily and Clopidogrel 75mg  daily for a minimum of 6 months (stable ischemic heart disease-Class I recommendation   Coronary Diagrams   Diagnostic Dominance: Left    Intervention     Implants      Echo 11/05/20: IMPRESSIONS     1. Diffuse hypokinesis worse in inferior base with abnormal septal motion  . Left ventricular ejection fraction, by estimation, is 25 to 30%. The  left ventricle has severely decreased function. The left ventricle  demonstrates global hypokinesis. The left   ventricular internal cavity size was moderately dilated. There is mild  left ventricular hypertrophy. Left ventricular diastolic parameters are  indeterminate.   2. Right ventricular systolic function is normal. The right ventricular  size is normal. There is normal pulmonary artery systolic pressure.   3. The mitral valve is normal in structure. Mild mitral valve  regurgitation. No evidence of mitral stenosis.   4. The aortic valve is tricuspid. Aortic valve regurgitation is trivial.  No aortic stenosis is present.   5. The inferior  vena cava is dilated in size with >50% respiratory  variability, suggesting right atrial pressure of 8 mmHg.    Echo 01/19/21: IMPRESSIONS     1. Left ventricular ejection fraction, by estimation, is 45 to 50%. The  left ventricle has mildly decreased function. The left ventricle  demonstrates regional wall motion abnormalities (see scoring  diagram/findings for description). Left ventricular  diastolic parameters are consistent with Grade I diastolic dysfunction  (impaired relaxation). There is severe akinesis of the left ventricular,  basal inferior wall.   2. Right ventricular systolic function is normal. The right ventricular  size is normal. There is normal pulmonary artery systolic pressure.   3. Right atrial size was mildly dilated.   4. The mitral valve is normal in structure. Trivial mitral valve  regurgitation.   5. The aortic valve is tricuspid. Aortic valve regurgitation is mild.   6. There is dilatation of the ascending aorta.   ASSESSMENT AND PLAN:  1. Coronary disease status post remote stenting of the left circumflex in 2000 with a bare-metal stent.  In 2022  had High risk Myoview study. EF down to 25-30%. Cardiac cath demonstrated critical ostial LAD stenosis. S/p successful PCI with atherectomy and DES in April 2022.  Fortunately LV function improved significantly with revascularization. Continue ASA and metoprolol.   2. AChronic systolic CHF secondary to ischemic CM. S/p revascularization as noted. Will continue Toprol XL to 25 mg daily. continue Aldactone to 12.5 mg daily. Given consistently high BP will increase losartan to 100 mg daily.   Would avoid SGLT 2 inhibitor given hx of bladder yeast infection. Repeat Echo showed significant improvement in EF to 45-50%.   3. Hypertension- see #2  4. Hyperlipidemia- on Crestor. Last LDL 45 at goal  5. Diabetes mellitus.  Per Dr. Sol Passer. On metformin. A1c 6.7%  6. UTI with obstructive uropathy and hematuria. No active  symptoms   7. S/p diverticular bleed. Resolved.      Disposition:   FU with me in 6 months   Signed, Jerrit Horen [place], MD  09/29/2023 4:32 PM    [facility] [address] [place], [place], [place]  [address] Phone: [phone]; Fax: [phone]

## 2023-09-29 ENCOUNTER — Ambulatory Visit: Payer: Medicare Other | Attending: Cardiology | Admitting: Cardiology

## 2023-09-29 ENCOUNTER — Encounter: Payer: Self-pay | Admitting: Cardiology

## 2023-09-29 VITALS — BP 140/70 | HR 47 | Ht 73.0 in | Wt 176.0 lb

## 2023-09-29 DIAGNOSIS — I2510 Atherosclerotic heart disease of native coronary artery without angina pectoris: Secondary | ICD-10-CM

## 2023-09-29 DIAGNOSIS — I25110 Atherosclerotic heart disease of native coronary artery with unstable angina pectoris: Secondary | ICD-10-CM

## 2023-09-29 DIAGNOSIS — I5022 Chronic systolic (congestive) heart failure: Secondary | ICD-10-CM

## 2023-09-29 DIAGNOSIS — E782 Mixed hyperlipidemia: Secondary | ICD-10-CM | POA: Diagnosis not present

## 2023-09-29 DIAGNOSIS — I493 Ventricular premature depolarization: Secondary | ICD-10-CM

## 2023-09-29 DIAGNOSIS — I10 Essential (primary) hypertension: Secondary | ICD-10-CM | POA: Diagnosis not present

## 2023-09-29 DIAGNOSIS — I200 Unstable angina: Secondary | ICD-10-CM

## 2023-09-29 MED ORDER — LOSARTAN POTASSIUM 100 MG PO TABS: 100.0000 mg | ORAL_TABLET | Freq: Every day | ORAL | 3 refills | Status: DC

## 2023-09-29 NOTE — Patient Instructions (Signed)
 Medication Instructions:  Increase Losartan to 100 mg daily Continue all other medications *If you need a refill on your cardiac medications before your next appointment, please call your pharmacy*   Lab Work: None ordered   Testing/Procedures: None ordered   Follow-Up: At [facility], you and your health needs are our priority.  As part of our continuing mission to provide you with exceptional heart care, we have created designated Provider Care Teams.  These Care Teams include your primary Cardiologist (physician) and Advanced Practice Providers (APPs -  Physician Assistants and Nurse Practitioners) who all work together to provide you with the care you need, when you need it.  We recommend signing up for the patient portal called "MyChart".  Sign up information is provided on this After Visit Summary.  MyChart is used to connect with patients for Virtual Visits (Telemedicine).  Patients are able to view lab/test results, encounter notes, upcoming appointments, etc.  Non-urgent messages can be sent to your provider as well.   To learn more about what you can do with MyChart, go to [email or web address].    Your next appointment:  6 months     Call in April to schedule August appointment     Provider:  Dr.Jordan

## 2023-10-02 ENCOUNTER — Telehealth: Payer: Self-pay | Admitting: Cardiology

## 2023-10-02 NOTE — Telephone Encounter (Signed)
 Spoke to patient's wife she wanted to know if husband needs to take Aspirin 81 mg daily.Stated at one time he was told not to take.I will send message to Dr.Jordan for advice.

## 2023-10-02 NOTE — Telephone Encounter (Signed)
 Pt called in asking to speak with Elnita Maxwell, LPN

## 2023-10-03 NOTE — Telephone Encounter (Signed)
 Spoke to patient's wife Dr.Jordan advised take Aspirin 81 mg daily.

## 2023-10-30 ENCOUNTER — Other Ambulatory Visit: Payer: Self-pay | Admitting: Podiatry

## 2023-10-30 ENCOUNTER — Other Ambulatory Visit: Payer: Self-pay | Admitting: Cardiology

## 2023-11-06 ENCOUNTER — Ambulatory Visit (INDEPENDENT_AMBULATORY_CARE_PROVIDER_SITE_OTHER): Payer: Medicare Other | Admitting: Podiatry

## 2023-11-06 ENCOUNTER — Encounter: Payer: Self-pay | Admitting: Podiatry

## 2023-11-06 DIAGNOSIS — M79675 Pain in left toe(s): Secondary | ICD-10-CM

## 2023-11-06 DIAGNOSIS — M79674 Pain in right toe(s): Secondary | ICD-10-CM

## 2023-11-06 DIAGNOSIS — L84 Corns and callosities: Secondary | ICD-10-CM

## 2023-11-06 DIAGNOSIS — E1142 Type 2 diabetes mellitus with diabetic polyneuropathy: Secondary | ICD-10-CM

## 2023-11-06 DIAGNOSIS — B351 Tinea unguium: Secondary | ICD-10-CM

## 2023-11-06 NOTE — Patient Instructions (Signed)
 More silicone pads can be purchased from:  [email or web address]  Look for gel toe caps and hammertoe crest pads.  This can also be found on [employer].

## 2023-11-06 NOTE — Progress Notes (Unsigned)
  Subjective:  Patient ID: John Gamble, male    DOB: July 21, 1931,  MRN: [id number]  Chief Complaint  Patient presents with   [facility]    [facility] with callous. Last A1c was 6.7, two months ago. He take elliquis.    89 y.o. male presents with the above complaint. History confirmed with patient. Patient presenting for at risk foot care and callus trimming. Has history DM2 with neuropathy.  Nails become thickened with dystrophic nature and patient is unable to maintain himself.  Presenting with painful calluses to both third toes and right subthird metatarsal head.  Objective:  Physical Exam: warm to cool skin temperature gradient, good capillary refill.  Pedal skin atrophic. nail exam onychomycosis of the toenails, onycholysis, and dystrophic nails. DP pulses palpable, PT pulses palpable, and protective sensation diminished to forefoot.  Left Foot:  Pain with palpation of nails due to elongation and dystrophic growth. Hyperkeratotic lesion distal tuft of 3nd toe left foot.  Right Foot: Pain with palpation of nails due to elongation and dystrophic growth. Hyperkeratotic lesion sub 3rd met head right foot and distal right 3rd toe. Fat pad atrophy appreciated to bilateral forefoot  Assessment:   1. Pain due to onychomycosis of toenails of both feet   2. DM type 2 with diabetic peripheral neuropathy (HCC)   3. Callus of foot           Plan:  Patient was evaluated and treated and all questions answered.  #Hyperkeratotic lesions/pre ulcerative calluses present sub 3rd met head right foot, distal left 3nd toe and distal right 3rd toe All symptomatic hyperkeratoses x 3 were debrided with a sterile #15 blade to patient's level of comfort without incident. Pinpoint bleeding to right 3rd toe, mupirocin and Band-Aid applied. We discussed preventative and palliative care of these lesions including supportive and accommodative shoegear, padding, prefabricated and custom molded accommodative orthoses, use of  a pumice stone and lotions/creams daily.   #Onychomycosis with pain  -Nails palliatively debrided as below. -Educated on self-care  Procedure: Nail Debridement Rationale: Pain Type of Debridement: manual, sharp debridement. Instrumentation: Nail nipper, rotary burr. Number of Nails: 10  Patient educated on diabetes. Discussed proper diabetic foot care and discussed risks and complications of disease. Educated patient in depth on reasons to return to the office immediately should he/she discover anything concerning or new on the feet. All questions answered. Discussed proper shoes as well.    Return in about 3 months (around 02/05/2024) for Diabetic Foot Care.         Bronwen Betters, DPM [facility]

## 2024-02-05 ENCOUNTER — Ambulatory Visit: Admitting: Podiatry

## 2024-02-05 ENCOUNTER — Encounter: Payer: Self-pay | Admitting: Podiatry

## 2024-02-05 DIAGNOSIS — M79674 Pain in right toe(s): Secondary | ICD-10-CM

## 2024-02-05 DIAGNOSIS — M79675 Pain in left toe(s): Secondary | ICD-10-CM | POA: Diagnosis not present

## 2024-02-05 DIAGNOSIS — B351 Tinea unguium: Secondary | ICD-10-CM | POA: Diagnosis not present

## 2024-02-05 DIAGNOSIS — M216X2 Other acquired deformities of left foot: Secondary | ICD-10-CM | POA: Diagnosis not present

## 2024-02-05 DIAGNOSIS — E1142 Type 2 diabetes mellitus with diabetic polyneuropathy: Secondary | ICD-10-CM | POA: Diagnosis not present

## 2024-02-05 DIAGNOSIS — L97511 Non-pressure chronic ulcer of other part of right foot limited to breakdown of skin: Secondary | ICD-10-CM

## 2024-02-05 DIAGNOSIS — M216X1 Other acquired deformities of right foot: Secondary | ICD-10-CM

## 2024-02-05 DIAGNOSIS — L84 Corns and callosities: Secondary | ICD-10-CM

## 2024-02-05 MED ORDER — MUPIROCIN 2 % EX OINT: 1.0000 | TOPICAL_OINTMENT | Freq: Two times a day (BID) | CUTANEOUS | 2 refills | Status: DC

## 2024-02-05 NOTE — Progress Notes (Unsigned)
 Chief Complaint  Patient presents with   [facility]    [facility] with a callous that looks like it is trying to ulcerate. Last A1c 6.7 in Jan. And takes ASA    HPI: 89 y.o. male presents today for diabetic footcare.  He has painful, thickened, elongated toenails that he is unable to maintain himself due to their dystrophic nature and due to mobility issues.  He also deals with recurrent calluses to the right subthird metatarsal head and distal aspects of the third toes bilaterally.  The subthird metatarsal head callus has significant dried blood and he is concerned about ulceration.  He has noticed these changes for about a month.  Last A1c 6.7.  Past Medical History:  Diagnosis Date   Coronary artery disease    Diverticulitis    Hyperlipidemia    Hypertension    MI, old    OA (osteoarthritis)    Pneumonia    hx of 2013     Past Surgical History:  Procedure Laterality Date   BIOPSY  08/04/2021   Procedure: BIOPSY;  Surgeon: Abran Norleen SAILOR, MD;  Location: [facility] ENDOSCOPY;  Service: Endoscopy;;   CARDIAC CATHETERIZATION  01/26/1999   EF 55%   CARDIAC CATHETERIZATION N/A 05/06/2015   Procedure: Left Heart Cath and Coronary Angiography;  Surgeon: Peter M Swaziland, MD;  Location: [facility] INVASIVE CV LAB;  Service: Cardiovascular;  Laterality: N/A;   CARDIOVASCULAR STRESS TEST  02/22/2010   EF 61%   COLONOSCOPY WITH PROPOFOL  N/A 08/04/2021   Procedure: COLONOSCOPY WITH PROPOFOL ;  Surgeon: Abran Norleen SAILOR, MD;  Location: [facility] ENDOSCOPY;  Service: Endoscopy;  Laterality: N/A;   corneal implant     left   CORONARY ATHERECTOMY N/A 11/04/2020   Procedure: CORONARY ATHERECTOMY;  Surgeon: Swaziland, Peter M, MD;  Location: [facility] INVASIVE CV LAB;  Service: Cardiovascular;  Laterality: N/A;   CORONARY STENT INTERVENTION N/A 11/04/2020   Procedure: CORONARY STENT INTERVENTION;  Surgeon: Swaziland, Peter M, MD;  Location: [facility] INVASIVE CV LAB;  Service: Cardiovascular;  Laterality: N/A;   CORONARY STENT PLACEMENT  2000   BMS  LEFT CIRCUMFLEX CORONARY   CORONARY ULTRASOUND/IVUS N/A 11/04/2020   Procedure: Intravascular Ultrasound/IVUS;  Surgeon: Swaziland, Peter M, MD;  Location: [facility] INVASIVE CV LAB;  Service: Cardiovascular;  Laterality: N/A;   ESOPHAGOGASTRODUODENOSCOPY N/A 08/04/2021   Procedure: ESOPHAGOGASTRODUODENOSCOPY (EGD);  Surgeon: Abran Norleen SAILOR, MD;  Location: [facility] ENDOSCOPY;  Service: Endoscopy;  Laterality: N/A;   INGUINAL HERNIA REPAIR     REPLACEMENT TOTAL KNEE     RIGHT/LEFT HEART CATH AND CORONARY ANGIOGRAPHY N/A 10/27/2020   Procedure: RIGHT/LEFT HEART CATH AND CORONARY ANGIOGRAPHY;  Surgeon: Swaziland, Peter M, MD;  Location: [facility] INVASIVE CV LAB;  Service: Cardiovascular;  Laterality: N/A;   TOTAL KNEE ARTHROPLASTY Left 09/19/2014   Procedure: LEFT TOTAL KNEE ARTHROPLASTY;  Surgeon: Lamar Collet, MD;  Location: [facility] ORS;  Service: Orthopedics;  Laterality: Left;    Allergies  Allergen Reactions   Atorvastatin  Other (See Comments)    myalgias   Oysters [Shellfish Allergy] Nausea Only   Sulfa Antibiotics Nausea And Vomiting    ROS    Physical Exam: There were no vitals filed for this visit.  General: The patient is alert and oriented x3 in no acute distress.  Dermatology: Pedal skin thin and atrophic.  There is hemorrhagic callus right subthird metatarsal head with partial thickness skin breakdown, exposed dermal tissue approximately 1.5 cm in diameter following debridement of the callus.  Preulcerative callus  present distal third toes bilaterally as well.  Nail plates x 10 are thickened, elongated, dystrophic with yellow discoloration several debris.  They are tender on direct dorsal palpation.  Vascular: Palpable pedal pulses bilaterally. Capillary refill within normal limits.  Skin temperature gradient warm to cool, capillary refill approximately 3 seconds to digits.  Diminished pedal hair growth.  Neurological: Light touch sensation grossly intact bilateral feet.  Protective sensation diminished  to the bilateral forefoot  Musculoskeletal Exam: Mild hammertoe deformities noted.  Fat pad atrophy to bilateral forefoot.  Prominent left and right subthird metatarsal head  Assessment/Plan of Care: 1. 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 Pain due to onychomycosis of toenails of both feet   3. DM type 2 with diabetic peripheral neuropathy (HCC)   4. Plantar fat pad atrophy of left foot   5. Ulcer of right foot limited to breakdown of skin (HCC)      Meds ordered this encounter  Medications   mupirocin  ointment (BACTROBAN ) 2 %    Sig: Apply 1 Application topically 2 (two) times daily.    Dispense:  30 g    Refill:  2   FOR HOME USE ONLY DME DIABETIC SHOE  Discussed clinical findings with patient today.  # Hemorrhagic callus right subthird metatarsal head with underlying partial-thickness ulceration with dermal tissue exposed - Medically necessary debridement with 312 scalpel blade.  No anesthesia was necessary secondary to neuropathy.  Debrided all nonviable dried heme and callus of overlying skin down to exposed dermal tissue.  Lumicain used for hemostasis.  Silvadene and bandage applied. - Offloading felt padding applied - Reviewed offloading and daily dressing changes with patient - Prescription for mupirocin  up sent to patient's pharmacy to be applied daily.  # Diabetes with neuropathy # Fat pad atrophy of both feet Patient educated on diabetes. Discussed proper diabetic foot care and discussed risks and complications of disease. Educated patient in depth on reasons to return to the office immediately should he/she discover anything concerning or new on the feet. All questions answered. Discussed proper shoes as well.  - Prescription sent in for diabetic shoes with 3 sets of multidensity inserts to [place] [facility].  Patient would benefit from these due to neuropathy, fat pad atrophy, hammertoe deformities putting him at risk for forefoot ulceration as noted above. -I certify  that this diagnosis represents a distinct and separate diagnosis that requires evaluation and treatment separate from other procedures or diagnosis   #Onychomycosis with pain  -Nails palliatively debrided as below. -Educated on self-care  Procedure: Nail Debridement Rationale: Pain Type of Debridement: manual, sharp debridement. Instrumentation: Nail nipper, rotary burr. Number of Nails: 10    Lasean Gorniak L. Lamount MAUL, AACFAS [facility]     [address] [place], [place] [address]                Office [phone]  Fax [phone]

## 2024-02-26 ENCOUNTER — Encounter: Payer: Self-pay | Admitting: Podiatry

## 2024-02-26 ENCOUNTER — Ambulatory Visit: Admitting: Podiatry

## 2024-02-26 DIAGNOSIS — L97511 Non-pressure chronic ulcer of other part of right foot limited to breakdown of skin: Secondary | ICD-10-CM | POA: Diagnosis not present

## 2024-02-26 DIAGNOSIS — E1142 Type 2 diabetes mellitus with diabetic polyneuropathy: Secondary | ICD-10-CM | POA: Diagnosis not present

## 2024-02-26 DIAGNOSIS — M216X1 Other acquired deformities of right foot: Secondary | ICD-10-CM

## 2024-02-26 NOTE — Progress Notes (Signed)
 Chief Complaint  Patient presents with   Diabetic Ulcer    Right foot, plantar ulcer. Scabbed over, there us  a little callous present. Last A1c 6.5 In July. ASA    HPI: 89 y.o. male following up today for right forefoot ulceration subsecond metatarsal head.  He thinks is doing pretty well.  He has been using mupirocin  ointment.  Denies pain.  States it is scabbed over today.  Past Medical History:  Diagnosis Date   Coronary artery disease    Diverticulitis    Hyperlipidemia    Hypertension    MI, old    OA (osteoarthritis)    Pneumonia    hx of 2013     Past Surgical History:  Procedure Laterality Date   BIOPSY  08/04/2021   Procedure: BIOPSY;  Surgeon: Abran Norleen SAILOR, MD;  Location: [facility] ENDOSCOPY;  Service: Endoscopy;;   CARDIAC CATHETERIZATION  01/26/1999   EF 55%   CARDIAC CATHETERIZATION N/A 05/06/2015   Procedure: Left Heart Cath and Coronary Angiography;  Surgeon: Peter M Swaziland, MD;  Location: [facility] INVASIVE CV LAB;  Service: Cardiovascular;  Laterality: N/A;   CARDIOVASCULAR STRESS TEST  02/22/2010   EF 61%   COLONOSCOPY WITH PROPOFOL  N/A 08/04/2021   Procedure: COLONOSCOPY WITH PROPOFOL ;  Surgeon: Abran Norleen SAILOR, MD;  Location: [facility] ENDOSCOPY;  Service: Endoscopy;  Laterality: N/A;   corneal implant     left   CORONARY ATHERECTOMY N/A 11/04/2020   Procedure: CORONARY ATHERECTOMY;  Surgeon: Swaziland, Peter M, MD;  Location: [facility] INVASIVE CV LAB;  Service: Cardiovascular;  Laterality: N/A;   CORONARY STENT INTERVENTION N/A 11/04/2020   Procedure: CORONARY STENT INTERVENTION;  Surgeon: Swaziland, Peter M, MD;  Location: [facility] INVASIVE CV LAB;  Service: Cardiovascular;  Laterality: N/A;   CORONARY STENT PLACEMENT  2000   BMS LEFT CIRCUMFLEX CORONARY   CORONARY ULTRASOUND/IVUS N/A 11/04/2020   Procedure: Intravascular Ultrasound/IVUS;  Surgeon: Swaziland, Peter M, MD;  Location: [facility] INVASIVE CV LAB;  Service: Cardiovascular;  Laterality: N/A;   ESOPHAGOGASTRODUODENOSCOPY N/A 08/04/2021    Procedure: ESOPHAGOGASTRODUODENOSCOPY (EGD);  Surgeon: Abran Norleen SAILOR, MD;  Location: [facility] ENDOSCOPY;  Service: Endoscopy;  Laterality: N/A;   INGUINAL HERNIA REPAIR     REPLACEMENT TOTAL KNEE     RIGHT/LEFT HEART CATH AND CORONARY ANGIOGRAPHY N/A 10/27/2020   Procedure: RIGHT/LEFT HEART CATH AND CORONARY ANGIOGRAPHY;  Surgeon: Swaziland, Peter M, MD;  Location: [facility] INVASIVE CV LAB;  Service: Cardiovascular;  Laterality: N/A;   TOTAL KNEE ARTHROPLASTY Left 09/19/2014   Procedure: LEFT TOTAL KNEE ARTHROPLASTY;  Surgeon: Lamar Collet, MD;  Location: [facility] ORS;  Service: Orthopedics;  Laterality: Left;    Allergies  Allergen Reactions   Atorvastatin  Other (See Comments)    myalgias   Oysters [Shellfish Allergy] Nausea Only   Sulfa Antibiotics Nausea And Vomiting    ROS    Physical Exam: There were no vitals filed for this visit.  General: The patient is alert and oriented x3 in no acute distress.  Dermatology: Pedal skin thin and atrophic.  There is hemorrhagic callus right subsecond metatarsal head at the site of prior partial thickness ulceration.  Upon debridement of the hemorrhagic callus, the area of ulceration does appear well-healed.  Vascular: Palpable pedal pulses bilaterally. Capillary refill within normal limits.  Skin temperature gradient warm to cool, capillary refill approximately 3 seconds to digits.  Diminished pedal hair growth.  Neurological: Light touch sensation grossly intact bilateral feet.  Protective sensation diminished to the bilateral  forefoot  Musculoskeletal Exam: Mild hammertoe deformities noted.  Fat pad atrophy to bilateral forefoot.  Prominent left and right subsecond and third metatarsal head  Assessment/Plan of Care: 1. Ulcer of right foot limited to breakdown of skin (HCC)   2. 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 DM type 2 with diabetic peripheral neuropathy (HCC)      No orders of the defined types were placed in this  encounter.  None  Discussed clinical findings with patient today.  # Right foot subsecond metatarsal head ulceration partial-thickness - Hemorrhagic callus was sharply debrided using 312 scalpel blade without incident - Underlying ulceration appears healed today. - He can continue to apply mupirocin  and bandage the next week as the underlying skin is thin and atrophic - Bactroban  and bandage applied - Metatarsal pad applied right forefoot - Recommend continued padding and offloading of the right forefoot using metatarsal pads or other similar silicone gel devices.  Will have patient follow-up in about 2 months for diabetic footcare   Samirah Scarpati L. Lamount MAUL, AACFAS [facility]     [address] [place], [place] [address]                Office [phone]  Fax [phone]

## 2024-02-26 NOTE — Patient Instructions (Signed)
 More silicone and felt pads can be purchased from:  [email or web address]   I recommend looking for metatarsal pads from [employer] or [email or web address] to pad the ball of your foot.  You could also look for universal gel strap from the website listed above or from companies like [email or web address].young.[email or web address]

## 2024-03-02 ENCOUNTER — Other Ambulatory Visit: Payer: Self-pay | Admitting: Cardiology

## 2024-03-03 ENCOUNTER — Other Ambulatory Visit: Payer: Self-pay | Admitting: Cardiology

## 2024-04-09 NOTE — Progress Notes (Signed)
 Cardiology Office Note    Date:  04/15/2024   ID:  John Gamble, DOB 06/17/1931, MRN [id number]  PCP:  Ofilia John LITTIE, MD  Cardiologist:  John Karim [place], MD   Chief Complaint  Patient presents with   Follow-up    6 months.   Dizziness            History of Present Illness: John Gamble is a 89 y.o. male who presents for follow up CAD. He has a history of coronary disease and is status post stenting of the left circumflex coronary in 2000. He had a normal stress Myoview  in July of 2011. He also has a history of hypertension and hyperlipidemia.   In September 2016 he had a nuclear stress test that was abnormal. This led to a cardiac cath.. Stents were patent but he did have ostial RCA (nondominant) and diagonal disease. Since he was asymptomatic medical therapy was recommended.   He did have Covid 19 infection in January 2021. He was admitted to [facility] with hypoxia and bilateral PNA.  He brings a diary of his BP readings and his BP has been excellent. He notes that since his Covid infection his energy is not as good and he has more dyspnea on exertion. He had persistent DOE and some arm pain.   He was evaluated with a Myoview  study which was high risk with EF 25% and multiple perfusion abnormalities. This led to a cardiac cath which showed patent stents in the dominant LCx. There was critical ostial LAD disease with heavy calcification. Fortunately right heart and LV filling pressures were ok. Patient returned for complex PCI with atherectomy on 11/04/20. He underwent  PCI involving atherectomy and stenting of the ostial LAD with DES. An excellent result was obtained.  Farxiga  was held due to recurrent UTI. A1c was 6.6%. He was continued on ARB ( unable to afford Entresto  ). and Toprol  XL and aldactone  were added. With revascularization and medical therapy there was significant improvement in LV function with EF up to 45-50%.   He was admitted in January 2023 with lower GI bleed.  Colonoscopy showed diverticuli. Upper EGD showed mild erosive gastritis. Hgb dropped to 10.3. managed medically. Plavix  initially held but resumed at DC. BP meds reduced due to hypotension (losartan  and aldactone ). Some NSVT noted on monitor but asymptomatic. On follow up BP was high and losartan  and aldactone  resumed.  He was admitted in August 2023 with lower GI bleed felt to be diverticular. Since he had pan EGD in Jan no further work up needed.   He was admitted in May 2024 with rectal bleeding. CT GI showed no source of bleeding but there was some sigmoid diverticulitis which was treated with antibiotics. Hgb remained stable. No colonoscopy indicated per GI.   On follow up today he is doing ok. He had to quit going the the gym due to incontinence with stool. Does feel lightheaded at times. Has a hard time getting up. He is less active than prior. No chest pain or dyspnea.      Past Medical History:  Diagnosis Date   Coronary artery disease    Diverticulitis    Hyperlipidemia    Hypertension    MI, old    OA (osteoarthritis)    Pneumonia    hx of 2013     Past Surgical History:  Procedure Laterality Date   BIOPSY  08/04/2021   Procedure: BIOPSY;  Surgeon: John Norleen SAILOR, MD;  Location: [facility]  ENDOSCOPY;  Service: Endoscopy;;   CARDIAC CATHETERIZATION  01/26/1999   EF 55%   CARDIAC CATHETERIZATION N/A 05/06/2015   Procedure: Left Heart Cath and Coronary Angiography;  Surgeon: Skylene Deremer M [place], MD;  Location: [facility] INVASIVE CV LAB;  Service: Cardiovascular;  Laterality: N/A;   CARDIOVASCULAR STRESS TEST  02/22/2010   EF 61%   COLONOSCOPY WITH PROPOFOL  N/A 08/04/2021   Procedure: COLONOSCOPY WITH PROPOFOL ;  Surgeon: John Norleen SAILOR, MD;  Location: [facility] ENDOSCOPY;  Service: Endoscopy;  Laterality: N/A;   corneal implant     left   CORONARY ATHERECTOMY N/A 11/04/2020   Procedure: CORONARY ATHERECTOMY;  Surgeon: Gamble, John Froelich M, MD;  Location: [facility] INVASIVE CV LAB;  Service: Cardiovascular;   Laterality: N/A;   CORONARY STENT INTERVENTION N/A 11/04/2020   Procedure: CORONARY STENT INTERVENTION;  Surgeon: Gamble, John Hlad M, MD;  Location: [facility] INVASIVE CV LAB;  Service: Cardiovascular;  Laterality: N/A;   CORONARY STENT PLACEMENT  2000   BMS LEFT CIRCUMFLEX CORONARY   CORONARY ULTRASOUND/IVUS N/A 11/04/2020   Procedure: Intravascular Ultrasound/IVUS;  Surgeon: Gamble, John Kienast M, MD;  Location: [facility] INVASIVE CV LAB;  Service: Cardiovascular;  Laterality: N/A;   ESOPHAGOGASTRODUODENOSCOPY N/A 08/04/2021   Procedure: ESOPHAGOGASTRODUODENOSCOPY (EGD);  Surgeon: John Norleen SAILOR, MD;  Location: [facility] ENDOSCOPY;  Service: Endoscopy;  Laterality: N/A;   INGUINAL HERNIA REPAIR     REPLACEMENT TOTAL KNEE     RIGHT/LEFT HEART CATH AND CORONARY ANGIOGRAPHY N/A 10/27/2020   Procedure: RIGHT/LEFT HEART CATH AND CORONARY ANGIOGRAPHY;  Surgeon: Gamble, John Sikkema M, MD;  Location: [facility] INVASIVE CV LAB;  Service: Cardiovascular;  Laterality: N/A;   TOTAL KNEE ARTHROPLASTY Left 09/19/2014   Procedure: LEFT TOTAL KNEE ARTHROPLASTY;  Surgeon: John Collet, MD;  Location: [facility] ORS;  Service: Orthopedics;  Laterality: Left;     Current Outpatient Medications  Medication Sig Dispense Refill   acyclovir  (ZOVIRAX ) 800 MG tablet Take 800 mg by mouth daily.      aspirin  EC 81 MG tablet Take 1 tablet (81 mg total) by mouth daily. 30 tablet 11   dorzolamide  (TRUSOPT ) 2 % ophthalmic solution Place 1 drop into both eyes 2 (two) times daily.     finasteride  (PROSCAR ) 5 MG tablet Take 1 tablet (5 mg total) by mouth daily.     Glucosamine-Chondroitin 250-200 MG TABS Take 2 tablets by mouth 2 (two) times daily.     glucose blood (ONETOUCH ULTRA) test strip 1 each by Other route daily.     Lancets (ONETOUCH DELICA PLUS LANCET33G) MISC 1 each by Other route daily.     losartan  (COZAAR ) 100 MG tablet Take 1 tablet (100 mg total) by mouth daily. 90 tablet 3   metFORMIN (GLUCOPHAGE-XR) 500 MG 24 hr tablet Take 500 mg by mouth 2 (two)  times daily.     metoprolol  succinate (TOPROL -XL) 25 MG 24 hr tablet TAKE 1 TABLET (25 MG TOTAL) BY MOUTH DAILY. 90 tablet 3   Multiple Vitamin (MULTIVITAMIN WITH MINERALS) TABS tablet Take 1 tablet by mouth daily. Centrum Silver     mupirocin  ointment (BACTROBAN ) 2 % Apply 1 Application topically 2 (two) times daily. 30 g 2   Omega-3 Fatty Acids (FISH OIL) 1200 MG CAPS Take 1,200 mg by mouth daily.     ONETOUCH ULTRA test strip daily.     pantoprazole  (PROTONIX ) 40 MG tablet Take 1 tablet (40 mg total) by mouth daily at 6 (six) AM. 30 tablet 0   polyethylene glycol (MIRALAX  / GLYCOLAX ) packet Take 17 g by  mouth daily as needed for mild constipation. Mix with 8 oz liquid and drink     prednisoLONE  acetate (PRED FORTE ) 1 % ophthalmic suspension Place 1 drop into the left eye at bedtime.     rosuvastatin  (CRESTOR ) 20 MG tablet Take 20 mg by mouth daily.     silodosin (RAPAFLO) 8 MG CAPS capsule Take 8 mg by mouth daily.     spironolactone  (ALDACTONE ) 25 MG tablet TAKE 1/2 TABLET BY MOUTH EVERY DAY 45 tablet 3   Zinc 50 MG CAPS Take 50 mg by mouth daily with supper.      No current facility-administered medications for this visit.    Allergies:   Atorvastatin , Oysters [shellfish allergy], and Sulfa antibiotics    Social History:  The patient  reports that he quit smoking about 35 years ago. His smoking use included cigarettes. He started smoking about 85 years ago. He has a 25 pack-year smoking history. He has never used smokeless tobacco. He reports that he does not drink alcohol and does not use drugs.   Family History:  The patient's family history includes Cancer in his father and mother.    ROS:  Please see the history of present illness.   Otherwise, review of systems are positive for none.   All other systems are reviewed and negative.    PHYSICAL EXAM: VS:  BP 136/62 (BP Location: Left Arm, Patient Position: Sitting, Cuff Size: Normal)   Pulse 65   Ht 6' 1 (1.854 m)   Wt 183 lb  (83 kg)   BMI 24.14 kg/m  , BMI Body mass index is 24.14 kg/m. GENERAL:  Well appearing WM in NAD HEENT:  PERRL, EOMI, sclera are clear. Oropharynx is clear. NECK:  No jugular venous distention, carotid upstroke brisk and symmetric, no bruits, no thyromegaly or adenopathy LUNGS:  Clear to auscultation bilaterally CHEST:  Unremarkable HEART:  RRR with extrasystoles,  PMI not displaced or sustained,S1 and S2 within normal limits, no S3, no S4: no clicks, no rubs, no murmurs ABD:  Soft, nontender. BS +, no masses or bruits. No hepatomegaly, no splenomegaly EXT:  2 + pulses throughout, no edema, no cyanosis no clubbing. Radial site without hematoma.  SKIN:  Warm and dry.  No rashes NEURO:  Alert and oriented x 3. Cranial nerves II through XII intact. PSYCH:  Cognitively intact       Recent Labs: No results found for requested labs within last 365 days.    Lipid Panel    Component Value Date/Time   CHOL 91 11/05/2020 0243   TRIG 80 11/05/2020 0243   HDL 35 (L) 11/05/2020 0243   CHOLHDL 2.6 11/05/2020 0243   VLDL 16 11/05/2020 0243   LDLCALC 40 11/05/2020 0243      Wt Readings from Last 3 Encounters:  04/15/24 183 lb (83 kg)  09/29/23 176 lb (79.8 kg)  03/30/23 174 lb (78.9 kg)      Other studies Reviewed: Additional studies/ records that were reviewed today include:  November 2017. Cholesterol 164, triglycerides 300, HDL 32 LDL 83. A1c 6.7%. Dated 12/28/16: glucose 150, A1c 7%. Cholesterol 94, triglyderdes 198, HDL 30, LDL 38. Other chemistries normal.  Dated 07/06/17. Glucose 155, otherwise CMET normal. Cholesterol 109, triglycerides 164, HDL 35, LDL 59.  A1c 6.5%. Dated 07/17/18: cholesterol 108, triglycerides 167, HDL 38, LDL 55. Glucose 142. Otherwise CMET normal. A1c 6.7% Dated 07/22/19: cholesterol 103, triglycerides 187, HDL 36, LDL 53.  Dated 08/17/19: glucose 147. Otherwise CMET normal. Hgb  13.9.  Dated 01/23/20: Glucose 127. Otherwise CMET normal. Dated 07/27/20:  glucose 165, otherwise CMET normal. Cholesterol 108, triglycerides 155, HDL 35, LDL 54. A1c 6.6%.  DATED 02/19/24: cholesterol 101, triglycerides 138, HDL 41, LDL 38, CBC normal. A1c 6.4%.    Myoview  10/21/20: Study Highlights  Nuclear stress EF: 22%. The left ventricular ejection fraction is severely decreased (<30%). Defect 1: There is a defect present in the mid anterior, mid anteroseptal, mid inferolateral, mid anterolateral, apical anterior, apical inferior, apical lateral and apex location. Findings consistent with prior large anterior apical myocardial infarction. This is a high risk study. There is no evidence of ischemia . He has had a previous anterior apical MI   Cardiac cath 10/27/20:  RIGHT/LEFT HEART CATH AND CORONARY ANGIOGRAPHY    Conclusion    Ost LAD to Prox LAD lesion is 95% stenosed. 1st Diag lesion is 70% stenosed. Previously placed Ost Cx to Prox Cx stent (unknown type) is widely patent. Previously placed LPDA stent (unknown type) is widely patent. Ost RCA to Prox RCA lesion is 90% stenosed. There is severe left ventricular systolic dysfunction. LV end diastolic pressure is normal. The left ventricular ejection fraction is 25-35% by visual estimate.   1. Severe 2 vessel obstructive CAD.    - 90% ostial LAD. Severely calcified. 70% first diagonal    - patent stents in the proximal LCx and left PDA    - 90% ostial nondominant RCA 2. Severe LV dysfunction with regional wall motion abnormalities. 3. Normal LV filling pressures 4. Normal right heart pressures. 5. Preserved cardiac output.    Plan: recommend staged complex  PCI of the ostial LAD with atherectomy and stenting. The diagonal and RCA disease are old and involve small vessels. While CABG would be an option given his advanced age I would favor a percutaneous approach.    PCI 11/04/20: Procedures  CORONARY STENT INTERVENTION  CORONARY ATHERECTOMY  Intravascular Ultrasound/IVUS    Conclusion    Ost  LAD to Prox LAD lesion is 95% stenosed. A drug-eluting stent was successfully placed using a SYNERGY XD [id number]. Post intervention, there is a 0% residual stenosis.   1. Successful PCI of the ostial LAD with IVUS guidance, orbital atherectomy and DES x 1   Plan: DAPT for at least 6 months. Will observe overnight and anticipate DC in am.     Recommendations  Antiplatelet/Anticoag Recommend uninterrupted dual antiplatelet therapy with Aspirin  81mg  daily and Clopidogrel  75mg  daily for a minimum of 6 months (stable ischemic heart disease-Class I recommendation   Coronary Diagrams   Diagnostic Dominance: Left    Intervention     Implants      Echo 11/05/20: IMPRESSIONS     1. Diffuse hypokinesis worse in inferior base with abnormal septal motion  . Left ventricular ejection fraction, by estimation, is 25 to 30%. The  left ventricle has severely decreased function. The left ventricle  demonstrates global hypokinesis. The left   ventricular internal cavity size was moderately dilated. There is mild  left ventricular hypertrophy. Left ventricular diastolic parameters are  indeterminate.   2. Right ventricular systolic function is normal. The right ventricular  size is normal. There is normal pulmonary artery systolic pressure.   3. The mitral valve is normal in structure. Mild mitral valve  regurgitation. No evidence of mitral stenosis.   4. The aortic valve is tricuspid. Aortic valve regurgitation is trivial.  No aortic stenosis is present.   5. The inferior vena cava is dilated in size  with >50% respiratory  variability, suggesting right atrial pressure of 8 mmHg.    Echo 01/19/21: IMPRESSIONS     1. Left ventricular ejection fraction, by estimation, is 45 to 50%. The  left ventricle has mildly decreased function. The left ventricle  demonstrates regional wall motion abnormalities (see scoring  diagram/findings for description). Left ventricular  diastolic parameters are  consistent with Grade I diastolic dysfunction  (impaired relaxation). There is severe akinesis of the left ventricular,  basal inferior wall.   2. Right ventricular systolic function is normal. The right ventricular  size is normal. There is normal pulmonary artery systolic pressure.   3. Right atrial size was mildly dilated.   4. The mitral valve is normal in structure. Trivial mitral valve  regurgitation.   5. The aortic valve is tricuspid. Aortic valve regurgitation is mild.   6. There is dilatation of the ascending aorta.   ASSESSMENT AND PLAN:  1. Coronary disease status post remote stenting of the left circumflex in 2000 with a bare-metal stent.  In 2022  had High risk Myoview  study. EF down to 25-30%. Cardiac cath demonstrated critical ostial LAD stenosis. S/p successful PCI with atherectomy and DES in April 2022.  Fortunately LV function improved significantly with revascularization. EF 45-50%. Continue ASA and metoprolol .   2. Chronic systolic CHF secondary to ischemic CM. S/p revascularization as noted. Will continue Toprol  XL to 25 mg daily. continue Aldactone  to 12.5 mg daily. losartan  100 mg daily.   Would avoid SGLT 2 inhibitor given hx of bladder yeast infection. Repeat Echo showed significant improvement in EF to 45-50%. He is asymptomatic.   3. Hypertension- although he still gets some high readings BP looks acceptable today. Would avoid overly aggressive BP control given age and risk of falls.   4. Hyperlipidemia- on Crestor . Last LDL 38 at goal  5. Diabetes mellitus.  Per Dr. Ofilia. On metformin. A1c 6.4%  6. UTI with obstructive uropathy and hematuria. No active symptoms   7. S/p diverticular bleed. Resolved. Hgb normal.      Disposition:   FU with me in 6 months   Signed, Ancil Dewan [place], MD  04/15/2024 9:32 AM    [facility] [address] [place], [place], [place]  [address] Phone: [phone]; Fax: [phone]

## 2024-04-15 ENCOUNTER — Ambulatory Visit: Attending: Cardiology | Admitting: Cardiology

## 2024-04-15 ENCOUNTER — Encounter: Payer: Self-pay | Admitting: Cardiology

## 2024-04-15 VITALS — BP 136/62 | HR 65 | Ht 73.0 in | Wt 183.0 lb

## 2024-04-15 DIAGNOSIS — E782 Mixed hyperlipidemia: Secondary | ICD-10-CM | POA: Diagnosis not present

## 2024-04-15 DIAGNOSIS — I5022 Chronic systolic (congestive) heart failure: Secondary | ICD-10-CM | POA: Diagnosis not present

## 2024-04-15 DIAGNOSIS — I2510 Atherosclerotic heart disease of native coronary artery without angina pectoris: Secondary | ICD-10-CM | POA: Diagnosis not present

## 2024-04-15 DIAGNOSIS — I10 Essential (primary) hypertension: Secondary | ICD-10-CM | POA: Diagnosis not present

## 2024-04-15 NOTE — Patient Instructions (Signed)

## 2024-04-29 ENCOUNTER — Encounter: Payer: Self-pay | Admitting: Podiatry

## 2024-04-29 ENCOUNTER — Ambulatory Visit: Admitting: Podiatry

## 2024-04-29 DIAGNOSIS — E1142 Type 2 diabetes mellitus with diabetic polyneuropathy: Secondary | ICD-10-CM | POA: Diagnosis not present

## 2024-04-29 DIAGNOSIS — M79675 Pain in left toe(s): Secondary | ICD-10-CM | POA: Diagnosis not present

## 2024-04-29 DIAGNOSIS — B351 Tinea unguium: Secondary | ICD-10-CM

## 2024-04-29 DIAGNOSIS — M79674 Pain in right toe(s): Secondary | ICD-10-CM | POA: Diagnosis not present

## 2024-04-29 DIAGNOSIS — L84 Corns and callosities: Secondary | ICD-10-CM

## 2024-04-29 NOTE — Progress Notes (Unsigned)
  Subjective:  Patient ID: John Gamble, male    DOB: 03/19/1931,  MRN: [id number]  Chief Complaint  Patient presents with   [facility]    [facility] with out callous A1c 6.7 in Jan ASA    89 y.o. male presents with the above complaint. History confirmed with patient. Patient presenting with pain related to dystrophic thickened elongated nails. Patient is unable to trim own nails related to nail dystrophy and mobility issues. Patient does have a history of T2DM.  Last A1c 6.5.  Does have prior ulceration right forefoot subsecond metatarsal head which appears healed today.  Does have some calluses distal third toes.  Objective:  Physical Exam: warm, good capillary refill, pedal skin atrophic, decreased pedal hair growth nail exam onychomycosis of the toenails, onycholysis, and dystrophic nails greater than 3 mm thickening DP pulses palpable, PT pulses palpable, protective sensation absent to the forefoot bilaterally, and vibratory sensation diminished Left Foot:  Pain with palpation of nails due to elongation and dystrophic growth.  Preulcerative callus distal third toe.  +2 pitting edema left lower extremity and left dorsal foot Right Foot: Pain with palpation of nails due to elongation and dystrophic growth.  Preulcerative callus distal third toe  Assessment:   1. DM type 2 with diabetic peripheral neuropathy (HCC)   2. Pain due to onychomycosis of toenails of both feet   3. Pre-ulcerative calluses      Plan:  Patient was evaluated and treated and all questions answered.  #Hyperkeratotic lesions/pre ulcerative calluses present distal third toe All symptomatic hyperkeratoses x 2 separate lesions were safely debrided as a courtesy with a sterile #312 blade to patient's level of comfort without incident. We discussed preventative and palliative care of these lesions including supportive and accommodative shoegear, padding, prefabricated and custom molded accommodative orthoses, use of a pumice stone  and lotions/creams daily. - These are nearly preulcerative.  Do recommend that he apply over-the-counter antibacterial ointment for the next several days as needed and keep these areas protected from friction using bandages or gel toe caps.  #Onychomycosis with pain  -Nails palliatively debrided as below. -Educated on self-care  Procedure: Nail Debridement Rationale: Pain Type of Debridement: manual, sharp debridement. Instrumentation: Nail nipper, rotary burr. Number of Nails: 10  Patient educated on diabetes. Discussed proper diabetic foot care and discussed risks and complications of disease. Educated patient in depth on reasons to return to the office immediately should he/she discover anything concerning or new on the feet. All questions answered. Discussed proper shoes as well.    Return in about 3 months (around 07/29/2024) for Diabetic Foot Care.         Ethan Saddler, DPM [facility] / [facility]

## 2024-07-29 ENCOUNTER — Encounter: Payer: Self-pay | Admitting: Podiatry

## 2024-07-29 ENCOUNTER — Ambulatory Visit: Admitting: Podiatry

## 2024-07-29 DIAGNOSIS — E1142 Type 2 diabetes mellitus with diabetic polyneuropathy: Secondary | ICD-10-CM | POA: Diagnosis not present

## 2024-07-29 DIAGNOSIS — M79674 Pain in right toe(s): Secondary | ICD-10-CM

## 2024-07-29 DIAGNOSIS — B351 Tinea unguium: Secondary | ICD-10-CM | POA: Diagnosis not present

## 2024-07-29 DIAGNOSIS — M79675 Pain in left toe(s): Secondary | ICD-10-CM | POA: Diagnosis not present

## 2024-07-29 DIAGNOSIS — L97511 Non-pressure chronic ulcer of other part of right foot limited to breakdown of skin: Secondary | ICD-10-CM | POA: Diagnosis not present

## 2024-07-29 DIAGNOSIS — L84 Corns and callosities: Secondary | ICD-10-CM

## 2024-07-29 DIAGNOSIS — M216X1 Other acquired deformities of right foot: Secondary | ICD-10-CM

## 2024-07-29 MED ORDER — MUPIROCIN 2 % EX OINT: 1.0000 | TOPICAL_OINTMENT | Freq: Two times a day (BID) | CUTANEOUS | 2 refills | Status: AC

## 2024-07-29 NOTE — Progress Notes (Unsigned)
 Superfical wound right sub 2 and distal 3rd toe

## 2024-08-15 ENCOUNTER — Other Ambulatory Visit: Payer: Self-pay | Admitting: Cardiology

## 2024-08-15 NOTE — Telephone Encounter (Signed)
 In accordance with refill protocols, please review and address the following requirements before this medication refill can be authorized:  Labs

## 2024-08-20 ENCOUNTER — Encounter: Payer: Self-pay | Admitting: Podiatry

## 2024-08-20 ENCOUNTER — Ambulatory Visit: Admitting: Podiatry

## 2024-08-20 DIAGNOSIS — L97511 Non-pressure chronic ulcer of other part of right foot limited to breakdown of skin: Secondary | ICD-10-CM

## 2024-08-20 DIAGNOSIS — L84 Corns and callosities: Secondary | ICD-10-CM

## 2024-08-20 DIAGNOSIS — E1142 Type 2 diabetes mellitus with diabetic polyneuropathy: Secondary | ICD-10-CM

## 2024-08-20 NOTE — Progress Notes (Signed)
 "      Chief Complaint  Patient presents with   Wound Check    Right ulcer, on sub-met 3 closed and callused. Right 3rd callus on tip toe. Left 3rd, callused.  He found RX for ketokonosole but did not use it, needs to know if he does need to use it.  A1c 6.4 in July, ASA    HPI: 89 y.o. male presents today follows up today for partial-thickness ulcerations distal third toes bilaterally.  Overall doing well.  He stopped wearing previous tight fitting boots shoes.  He believes they are improving.  He does have next diabetic footcare visits.  Previously notes that the ulcerations are scabbed over without signs of infection.  Hemorrhagic callus present to right subsecond metatarsal head previous ulceration as well, appears to be improving.  Past Medical History:  Diagnosis Date   Coronary artery disease    Diverticulitis    Hyperlipidemia    Hypertension    MI, old    OA (osteoarthritis)    Pneumonia    hx of 2013     Past Surgical History:  Procedure Laterality Date   BIOPSY  08/04/2021   Procedure: BIOPSY;  Surgeon: John Norleen SAILOR, MD;  Location: [facility] ENDOSCOPY;  Service: Endoscopy;;   CARDIAC CATHETERIZATION  01/26/1999   EF 55%   CARDIAC CATHETERIZATION N/A 05/06/2015   Procedure: Left Heart Cath and Coronary Angiography;  Surgeon: John Gamble Jordan, MD;  Location: [facility] INVASIVE CV LAB;  Service: Cardiovascular;  Laterality: N/A;   CARDIOVASCULAR STRESS TEST  02/22/2010   EF 61%   COLONOSCOPY WITH PROPOFOL  N/A 08/04/2021   Procedure: COLONOSCOPY WITH PROPOFOL ;  Surgeon: John Norleen SAILOR, MD;  Location: [facility] ENDOSCOPY;  Service: Endoscopy;  Laterality: N/A;   corneal implant     left   CORONARY ATHERECTOMY N/A 11/04/2020   Procedure: CORONARY ATHERECTOMY;  Surgeon: Gamble, John M, MD;  Location: [facility] INVASIVE CV LAB;  Service: Cardiovascular;  Laterality: N/A;   CORONARY STENT INTERVENTION N/A 11/04/2020   Procedure: CORONARY STENT INTERVENTION;  Surgeon: Gamble, John M, MD;  Location: [facility]  INVASIVE CV LAB;  Service: Cardiovascular;  Laterality: N/A;   CORONARY STENT PLACEMENT  2000   BMS LEFT CIRCUMFLEX CORONARY   CORONARY ULTRASOUND/IVUS N/A 11/04/2020   Procedure: Intravascular Ultrasound/IVUS;  Surgeon: Gamble, John M, MD;  Location: [facility] INVASIVE CV LAB;  Service: Cardiovascular;  Laterality: N/A;   ESOPHAGOGASTRODUODENOSCOPY N/A 08/04/2021   Procedure: ESOPHAGOGASTRODUODENOSCOPY (EGD);  Surgeon: John Norleen SAILOR, MD;  Location: [facility] ENDOSCOPY;  Service: Endoscopy;  Laterality: N/A;   INGUINAL HERNIA REPAIR     REPLACEMENT TOTAL KNEE     RIGHT/LEFT HEART CATH AND CORONARY ANGIOGRAPHY N/A 10/27/2020   Procedure: RIGHT/LEFT HEART CATH AND CORONARY ANGIOGRAPHY;  Surgeon: Gamble, John M, MD;  Location: [facility] INVASIVE CV LAB;  Service: Cardiovascular;  Laterality: N/A;   TOTAL KNEE ARTHROPLASTY Left 09/19/2014   Procedure: LEFT TOTAL KNEE ARTHROPLASTY;  Surgeon: John Collet, MD;  Location: [facility] ORS;  Service: Orthopedics;  Laterality: Left;    Allergies[1]  ROS    Physical Exam: There were no vitals filed for this visit.  General: The patient is alert and oriented x3 in no acute distress.  Dermatology: Pedal skin thin and atrophic.  Site of partial-thickness skin breakdown left third toe distal aspect appears healed after paring down overlying callus.  Right third toe distal partial-thickness skin breakdown present approximately 0.3 x 0.4 cm in diameter following paring down of hemorrhagic callus.  Right subsecond hemorrhagic  callus without underlying ulceration, underlying skin thin and and atrophic.  Vascular: Palpable DP and PT pedal pulses bilaterally. Capillary refill 3 to 5 seconds to the digits.  No appreciable edema.  Neurological: Light touch sensation decreased to digits.  Protective sensation decreased to bilateral forefoot.  Vibratory sensation decreased.  Musculoskeletal Exam: Muscle strength 5/5 in dorsiflexion, plantarflexion inversion and eversion. Fat pad atrophy  noted bilateral forefoot. Prominent right second metatarsal head. Semireducible hammertoe contractures noted bilaterally.     Assessment/Plan of Care: 1. Ulcer of right foot, limited to breakdown of skin (HCC)   2. DM type 2 with diabetic peripheral neuropathy (HCC)      No orders of the defined types were placed in this encounter.  None  Discussed clinical findings with patient today.  Previously noted areas of ulceration sharply debrided using #15 blade without incident.  Right subsecond and left distal third toe ulcers appear healed.  There is still partial thickness ulceration right third toe with bleeding wound bed measuring 0.3 x 0.4 cm after sharply debriding nonviable hemorrhagic callus.  Antibiotic bandage applied.  Reviewed daily dressing changes with patient.  Gel toe caps dispensed offload area.  Monitor for signs of recurrence or developing infection.  Will recheck site for healing in 2 to 3 weeks.  Last visit had diabetic shoes ordered, awaiting fitting appointment for this.     John Gamble, AACFAS [facility]     [address] [place], [place] [address]                Office [phone]  Fax [phone]       [1]  Allergies Allergen Reactions   Atorvastatin  Other (See Comments)    myalgias   Oysters [Shellfish Allergy] Nausea Only   Sulfa Antibiotics Nausea And Vomiting   "

## 2024-09-10 ENCOUNTER — Ambulatory Visit: Admitting: Podiatry

## 2024-10-29 ENCOUNTER — Ambulatory Visit: Admitting: Podiatry
